# Patient Record
Sex: Female | Born: 1970 | Race: White | Hispanic: No | Smoking: Never smoker
Health system: Southern US, Community
[De-identification: ages and names within clinical notes are randomized; demographics above are authoritative.]

## PROBLEM LIST (undated history)

## (undated) DIAGNOSIS — B009 Herpesviral infection, unspecified: Secondary | ICD-10-CM

## (undated) DIAGNOSIS — R51 Headache: Secondary | ICD-10-CM

## (undated) DIAGNOSIS — F32A Depression, unspecified: Secondary | ICD-10-CM

## (undated) DIAGNOSIS — F419 Anxiety disorder, unspecified: Secondary | ICD-10-CM

## (undated) DIAGNOSIS — N200 Calculus of kidney: Secondary | ICD-10-CM

## (undated) DIAGNOSIS — G709 Myoneural disorder, unspecified: Secondary | ICD-10-CM

## (undated) DIAGNOSIS — K219 Gastro-esophageal reflux disease without esophagitis: Secondary | ICD-10-CM

## (undated) DIAGNOSIS — R519 Headache, unspecified: Secondary | ICD-10-CM

## (undated) DIAGNOSIS — G4730 Sleep apnea, unspecified: Secondary | ICD-10-CM

## (undated) DIAGNOSIS — I10 Essential (primary) hypertension: Secondary | ICD-10-CM

## (undated) DIAGNOSIS — G2581 Restless legs syndrome: Secondary | ICD-10-CM

## (undated) DIAGNOSIS — F329 Major depressive disorder, single episode, unspecified: Secondary | ICD-10-CM

## (undated) DIAGNOSIS — A64 Unspecified sexually transmitted disease: Secondary | ICD-10-CM

## (undated) DIAGNOSIS — T7840XA Allergy, unspecified, initial encounter: Secondary | ICD-10-CM

## (undated) DIAGNOSIS — J358 Other chronic diseases of tonsils and adenoids: Secondary | ICD-10-CM

## (undated) DIAGNOSIS — J189 Pneumonia, unspecified organism: Secondary | ICD-10-CM

## (undated) DIAGNOSIS — IMO0001 Reserved for inherently not codable concepts without codable children: Secondary | ICD-10-CM

## (undated) HISTORY — DX: Unspecified sexually transmitted disease: A64

## (undated) HISTORY — DX: Depression, unspecified: F32.A

## (undated) HISTORY — DX: Allergy, unspecified, initial encounter: T78.40XA

## (undated) HISTORY — PX: COLONOSCOPY: SHX174

## (undated) HISTORY — DX: Herpesviral infection, unspecified: B00.9

## (undated) HISTORY — DX: Anxiety disorder, unspecified: F41.9

## (undated) HISTORY — PX: ESSURE TUBAL LIGATION: SUR464

## (undated) HISTORY — DX: Gastro-esophageal reflux disease without esophagitis: K21.9

## (undated) HISTORY — PX: ECTOPIC PREGNANCY SURGERY: SHX613

## (undated) HISTORY — PX: UPPER GI ENDOSCOPY: SHX6162

## (undated) HISTORY — PX: WISDOM TOOTH EXTRACTION: SHX21

## (undated) HISTORY — DX: Major depressive disorder, single episode, unspecified: F32.9

---

## 1991-06-18 HISTORY — PX: DILATION AND CURETTAGE OF UTERUS: SHX78

## 1997-09-27 ENCOUNTER — Other Ambulatory Visit: Admission: RE | Admit: 1997-09-27 | Discharge: 1997-09-27 | Payer: Self-pay | Admitting: Obstetrics and Gynecology

## 1997-12-28 ENCOUNTER — Other Ambulatory Visit: Admission: RE | Admit: 1997-12-28 | Discharge: 1997-12-28 | Payer: Self-pay | Admitting: Obstetrics and Gynecology

## 1998-04-25 ENCOUNTER — Other Ambulatory Visit: Admission: RE | Admit: 1998-04-25 | Discharge: 1998-04-25 | Payer: Self-pay | Admitting: Obstetrics and Gynecology

## 1999-11-05 ENCOUNTER — Other Ambulatory Visit: Admission: RE | Admit: 1999-11-05 | Discharge: 1999-11-05 | Payer: Self-pay | Admitting: Obstetrics and Gynecology

## 2000-05-25 ENCOUNTER — Inpatient Hospital Stay (HOSPITAL_COMMUNITY): Admission: AD | Admit: 2000-05-25 | Discharge: 2000-05-27 | Payer: Self-pay | Admitting: Obstetrics and Gynecology

## 2000-05-28 ENCOUNTER — Encounter: Admission: RE | Admit: 2000-05-28 | Discharge: 2000-07-03 | Payer: Self-pay | Admitting: Obstetrics and Gynecology

## 2000-07-09 ENCOUNTER — Emergency Department (HOSPITAL_COMMUNITY): Admission: EM | Admit: 2000-07-09 | Discharge: 2000-07-10 | Payer: Self-pay | Admitting: Emergency Medicine

## 2001-02-08 ENCOUNTER — Encounter: Payer: Self-pay | Admitting: Emergency Medicine

## 2001-02-08 ENCOUNTER — Emergency Department (HOSPITAL_COMMUNITY): Admission: EM | Admit: 2001-02-08 | Discharge: 2001-02-08 | Payer: Self-pay | Admitting: Emergency Medicine

## 2001-02-25 ENCOUNTER — Ambulatory Visit (HOSPITAL_COMMUNITY): Admission: RE | Admit: 2001-02-25 | Discharge: 2001-02-26 | Payer: Self-pay | Admitting: General Surgery

## 2001-02-25 ENCOUNTER — Encounter (INDEPENDENT_AMBULATORY_CARE_PROVIDER_SITE_OTHER): Payer: Self-pay | Admitting: *Deleted

## 2001-02-25 ENCOUNTER — Encounter: Payer: Self-pay | Admitting: General Surgery

## 2001-02-25 HISTORY — PX: LAPAROSCOPIC CHOLECYSTECTOMY W/ CHOLANGIOGRAPHY: SUR757

## 2001-08-11 ENCOUNTER — Other Ambulatory Visit: Admission: RE | Admit: 2001-08-11 | Discharge: 2001-08-11 | Payer: Self-pay | Admitting: Obstetrics and Gynecology

## 2002-12-16 ENCOUNTER — Other Ambulatory Visit: Admission: RE | Admit: 2002-12-16 | Discharge: 2002-12-16 | Payer: Self-pay | Admitting: Obstetrics and Gynecology

## 2004-02-07 ENCOUNTER — Other Ambulatory Visit: Admission: RE | Admit: 2004-02-07 | Discharge: 2004-02-07 | Payer: Self-pay | Admitting: Obstetrics and Gynecology

## 2005-04-08 ENCOUNTER — Other Ambulatory Visit: Admission: RE | Admit: 2005-04-08 | Discharge: 2005-04-08 | Payer: Self-pay | Admitting: Obstetrics and Gynecology

## 2007-01-10 ENCOUNTER — Emergency Department (HOSPITAL_COMMUNITY): Admission: EM | Admit: 2007-01-10 | Discharge: 2007-01-10 | Payer: Self-pay | Admitting: Emergency Medicine

## 2007-08-20 ENCOUNTER — Ambulatory Visit (HOSPITAL_COMMUNITY): Admission: RE | Admit: 2007-08-20 | Discharge: 2007-08-20 | Payer: Self-pay | Admitting: Urology

## 2009-04-24 ENCOUNTER — Encounter: Admission: RE | Admit: 2009-04-24 | Discharge: 2009-04-24 | Payer: Self-pay | Admitting: Neurology

## 2009-07-28 ENCOUNTER — Ambulatory Visit (HOSPITAL_COMMUNITY): Admission: RE | Admit: 2009-07-28 | Discharge: 2009-07-28 | Payer: Self-pay | Admitting: Neurology

## 2009-07-31 ENCOUNTER — Encounter: Admission: RE | Admit: 2009-07-31 | Discharge: 2009-07-31 | Payer: Self-pay | Admitting: Neurology

## 2009-09-17 ENCOUNTER — Emergency Department (HOSPITAL_COMMUNITY): Admission: EM | Admit: 2009-09-17 | Discharge: 2009-09-17 | Payer: Self-pay | Admitting: Family Medicine

## 2010-07-16 ENCOUNTER — Ambulatory Visit: Payer: Self-pay | Admitting: Oncology

## 2010-08-06 ENCOUNTER — Other Ambulatory Visit: Payer: Self-pay | Admitting: Oncology

## 2010-08-06 ENCOUNTER — Encounter (HOSPITAL_BASED_OUTPATIENT_CLINIC_OR_DEPARTMENT_OTHER): Payer: Self-pay | Admitting: Oncology

## 2010-08-06 DIAGNOSIS — D72829 Elevated white blood cell count, unspecified: Secondary | ICD-10-CM

## 2010-08-06 LAB — COMPREHENSIVE METABOLIC PANEL
AST: 17 U/L (ref 0–37)
Alkaline Phosphatase: 88 U/L (ref 39–117)
BUN: 15 mg/dL (ref 6–23)
Creatinine, Ser: 0.76 mg/dL (ref 0.40–1.20)

## 2010-08-06 LAB — MORPHOLOGY
PLT EST: ADEQUATE
RBC Comments: NORMAL

## 2010-08-06 LAB — CBC WITH DIFFERENTIAL/PLATELET
BASO%: 0.3 % (ref 0.0–2.0)
EOS%: 0.5 % (ref 0.0–7.0)
HGB: 14.8 g/dL (ref 11.6–15.9)
MCH: 31.3 pg (ref 25.1–34.0)
MCHC: 34.3 g/dL (ref 31.5–36.0)
RDW: 12.3 % (ref 11.2–14.5)
lymph#: 2.8 10*3/uL (ref 0.9–3.3)

## 2010-08-06 LAB — IRON AND TIBC
%SAT: 36 % (ref 20–55)
Iron: 108 ug/dL (ref 42–145)
UIBC: 188 ug/dL

## 2010-08-08 LAB — LEUKOCYTE ALKALINE PHOSPHATASE: Leukocyte Alkaline Phos Stain: 194 — ABNORMAL HIGH (ref 30–140)

## 2010-08-10 ENCOUNTER — Encounter: Payer: Self-pay | Admitting: Oncology

## 2010-08-17 ENCOUNTER — Other Ambulatory Visit: Payer: Self-pay | Admitting: Oncology

## 2010-08-17 ENCOUNTER — Encounter (HOSPITAL_BASED_OUTPATIENT_CLINIC_OR_DEPARTMENT_OTHER): Payer: 59 | Admitting: Oncology

## 2010-08-17 DIAGNOSIS — D72829 Elevated white blood cell count, unspecified: Secondary | ICD-10-CM

## 2010-08-17 LAB — CBC WITH DIFFERENTIAL/PLATELET
Basophils Absolute: 0 10*3/uL (ref 0.0–0.1)
EOS%: 0.7 % (ref 0.0–7.0)
HGB: 14.3 g/dL (ref 11.6–15.9)
LYMPH%: 22 % (ref 14.0–49.7)
MCH: 31.5 pg (ref 25.1–34.0)
MCV: 92.5 fL (ref 79.5–101.0)
MONO%: 5.3 % (ref 0.0–14.0)
Platelets: 329 10*3/uL (ref 145–400)
RBC: 4.54 10*6/uL (ref 3.70–5.45)
RDW: 12.9 % (ref 11.2–14.5)

## 2010-08-20 ENCOUNTER — Ambulatory Visit (HOSPITAL_COMMUNITY)
Admission: RE | Admit: 2010-08-20 | Discharge: 2010-08-20 | Disposition: A | Discharge status: Home / Self Care | Payer: 59 | Source: Ambulatory Visit | Attending: Oncology | Admitting: Oncology

## 2010-08-20 ENCOUNTER — Encounter (HOSPITAL_COMMUNITY): Payer: 59

## 2010-08-20 DIAGNOSIS — M7989 Other specified soft tissue disorders: Secondary | ICD-10-CM | POA: Insufficient documentation

## 2010-08-20 DIAGNOSIS — M79609 Pain in unspecified limb: Secondary | ICD-10-CM | POA: Insufficient documentation

## 2010-08-20 DIAGNOSIS — I10 Essential (primary) hypertension: Secondary | ICD-10-CM | POA: Insufficient documentation

## 2010-09-05 LAB — CSF CELL COUNT WITH DIFFERENTIAL
Eosinophils, CSF: 0 % (ref 0–1)
RBC Count, CSF: 0 /mm3
WBC, CSF: 0 /mm3 (ref 0–5)
WBC, CSF: 1 /mm3 (ref 0–5)

## 2010-09-05 LAB — CSF CULTURE W GRAM STAIN: Culture: NO GROWTH

## 2010-09-05 LAB — PROTEIN AND GLUCOSE, CSF: Total  Protein, CSF: 29 mg/dL (ref 15–45)

## 2010-11-02 NOTE — Op Note (Signed)
[facility]  Patient:    Helen Greene, Helen Greene Visit Number: [id number] MRN: [id number]          Service Type: DSU Location: [id number] Attending Physician:  Cherylynn Ridges Dictated by:   Jimmye Norman, M.D. Proc. Date: 02/25/01 Admit Date:  02/25/2001                             Operative Report  PREOPERATIVE DIAGNOSIS:  Symptomatic cholelithiasis.  POSTOPERATIVE DIAGNOSIS:  Symptomatic cholelithiasis.  OPERATION PERFORMED:  Laparoscopic cholecystectomy.  SURGEON:  Jimmye Norman, M.D.  ASSISTANT:  Zigmund Daniel, M.D.  ANESTHESIA:  General endotracheal.  ESTIMATED BLOOD LOSS:  Less than 30 cc.  COMPLICATIONS:  None.  CONDITION:  Stable.  FINDINGS:  Multiple adhesions to the inferior posterior aspect of the liver attaching the duodenum and omentum to the liver edge.  Not acutely inflamed gallbladder with chronic cholecystitis .  DISPOSITION:  To PACU and then 5700 when stable.  INDICATIONS FOR PROCEDURE:  The patient is a 40 year old woman who has been plagued with abdominal pain in the right upper quadrant postprandially.  She now comes in laparoscopic cholecystectomy.  DESCRIPTION OF PROCEDURE:  The patient was taken to the operating room and placed on the table in a supine position.  After an adequate general anesthetic was administered, she was prepped and draped in the usual sterile manner exposing the midline in the right upper quadrant of the abdomen.  The patient was placed in reverse Trendelenburg position.  A superumbilical curvilinear incision was made using a #11 blade and taken down through the midline fascia.  It was through this midline fascia that a Veress needle was passed into the peritoneal cavity while tenting up against the anterior abdominal wall with sharp towel clamps.  We confirmed the position of the Veress needle using the saline test and once this was done, carbon dioxide insufflation was instilled into the  peritoneal cavity up to a maximal intra-abdominal pressure of 15 mmHg.  Once that was done a 10-11 mm trocar and cannula was passed through the superumbilical fascia into the peritoneal cavity and confirmed to be in adequate position using the laparoscope with attached camera and light source.  Subsequently, two right costal margin 5 mm cannulas in the subxiphoid 10-11 mm cannula were passed into the peritoneal cavity under direct vision. The patient was placed in steeper reverse Trendelenburg position.  The left side was tilted down and the dissection begun.  The adhesions were noted when the gallbladder was lifted up towards the anterior abdominal wall in the right upper quadrant.  These adhesions were to the posterior inferior aspect of the liver.  They were taken down using Endo shears and electrocautery.  Care was taken not to injure the bowel during this dissection.  Once these adhesions were taken down, we began dissection along the peritoneum overlying the triangle of Calot and the hepatoduodenal triangle.  These peritoneal attachments were taken down and subsequently the cystic duct and cystic artery identified and clipped proximally and distally. Distally, away from the gallbladder, the both the artery and the duct were doubly clipped and then subsequently transected.  Once this was done, we dissected the gallbladder out of its bed with minimal difficulty and no entrance into the gallbladder wall.  We were able to remove the superumbilical fascial site with minimal difficulty because of pliability of the gallbladder. We obtained hemostasis of the gallbladder bed  using electrocautery which was minimal in terms of bleeding.  We irrigated with saline.  Less than a liter was used and then subsequently allowed all gas to escape from the cannula sites.  Once this was done, we were able to reapproximate the superumbilical fascia using a figure-of-eight stitch of 0 Vicryl.  0.25%  Marcaine with epinephrine was injected at all incision sites into the subcutaneous.  Once this was done, we reapproximated the skin using a running subcuticular stitch of 4-0 Vicryl.  Sponge, needle and instrument counts were correct.  Dry sterile dressings were applied including Steri-Strips, 4 x 4 gauze and Betadine ointment. Dictated by:   Jimmye Norman, M.D. Attending Physician:  Cherylynn Ridges DD:  02/25/01 TD:  02/25/01 Job: [id number]

## 2011-02-25 ENCOUNTER — Encounter (HOSPITAL_BASED_OUTPATIENT_CLINIC_OR_DEPARTMENT_OTHER): Payer: 59 | Admitting: Oncology

## 2011-02-25 ENCOUNTER — Other Ambulatory Visit: Payer: Self-pay | Admitting: Oncology

## 2011-02-25 DIAGNOSIS — D72829 Elevated white blood cell count, unspecified: Secondary | ICD-10-CM

## 2011-02-25 LAB — CBC WITH DIFFERENTIAL/PLATELET
BASO%: 0.2 % (ref 0.0–2.0)
Basophils Absolute: 0 10e3/uL (ref 0.0–0.1)
EOS%: 0.6 % (ref 0.0–7.0)
Eosinophils Absolute: 0.1 10e3/uL (ref 0.0–0.5)
HCT: 41.9 % (ref 34.8–46.6)
HGB: 14.4 g/dL (ref 11.6–15.9)
LYMPH%: 26.1 % (ref 14.0–49.7)
MCH: 32.1 pg (ref 25.1–34.0)
MCHC: 34.3 g/dL (ref 31.5–36.0)
MCV: 93.6 fL (ref 79.5–101.0)
MONO#: 0.7 10e3/uL (ref 0.1–0.9)
MONO%: 5.7 % (ref 0.0–14.0)
NEUT#: 8.7 10e3/uL — ABNORMAL HIGH (ref 1.5–6.5)
NEUT%: 67.4 % (ref 38.4–76.8)
Platelets: 346 10e3/uL (ref 145–400)
RBC: 4.48 10e6/uL (ref 3.70–5.45)
RDW: 12.4 % (ref 11.2–14.5)
WBC: 12.9 10e3/uL — ABNORMAL HIGH (ref 3.9–10.3)
lymph#: 3.4 10e3/uL — ABNORMAL HIGH (ref 0.9–3.3)

## 2011-03-11 LAB — BASIC METABOLIC PANEL
CO2: 24
Calcium: 9.6
Chloride: 105
Creatinine, Ser: 0.79
GFR calc Af Amer: >60
Sodium: 138

## 2011-03-11 LAB — PREGNANCY, URINE: Preg Test, Ur: NEGATIVE

## 2011-04-03 ENCOUNTER — Emergency Department (HOSPITAL_COMMUNITY): Payer: 59

## 2011-04-03 ENCOUNTER — Emergency Department (HOSPITAL_COMMUNITY)
Admission: EM | Admit: 2011-04-03 | Discharge: 2011-04-03 | Disposition: A | Discharge status: Home / Self Care | Payer: 59 | Attending: Emergency Medicine | Admitting: Emergency Medicine

## 2011-04-03 ENCOUNTER — Inpatient Hospital Stay (INDEPENDENT_AMBULATORY_CARE_PROVIDER_SITE_OTHER)
Admission: RE | Admit: 2011-04-03 | Discharge: 2011-04-03 | Disposition: A | Discharge status: Home / Self Care | Payer: 59 | Source: Ambulatory Visit | Attending: Emergency Medicine | Admitting: Emergency Medicine

## 2011-04-03 DIAGNOSIS — R109 Unspecified abdominal pain: Secondary | ICD-10-CM | POA: Insufficient documentation

## 2011-04-03 DIAGNOSIS — I10 Essential (primary) hypertension: Secondary | ICD-10-CM | POA: Insufficient documentation

## 2011-04-03 DIAGNOSIS — R197 Diarrhea, unspecified: Secondary | ICD-10-CM | POA: Insufficient documentation

## 2011-04-03 DIAGNOSIS — J984 Other disorders of lung: Secondary | ICD-10-CM | POA: Insufficient documentation

## 2011-04-03 DIAGNOSIS — N12 Tubulo-interstitial nephritis, not specified as acute or chronic: Secondary | ICD-10-CM | POA: Insufficient documentation

## 2011-04-03 DIAGNOSIS — R112 Nausea with vomiting, unspecified: Secondary | ICD-10-CM | POA: Insufficient documentation

## 2011-04-03 LAB — LIPASE, BLOOD: Lipase: 35 U/L (ref 11–59)

## 2011-04-03 LAB — PREGNANCY, URINE: Preg Test, Ur: NEGATIVE

## 2011-04-03 LAB — DIFFERENTIAL
Basophils Absolute: 0 10*3/uL (ref 0.0–0.1)
Basophils Relative: 0 % (ref 0–1)
Eosinophils Absolute: 0 10*3/uL (ref 0.0–0.7)
Eosinophils Relative: 0 % (ref 0–5)
Lymphocytes Relative: 7 % — ABNORMAL LOW (ref 12–46)
Lymphs Abs: 1.5 10*3/uL (ref 0.7–4.0)
Monocytes Absolute: 1 10*3/uL (ref 0.1–1.0)
Monocytes Relative: 4 % (ref 3–12)
Neutro Abs: 20.5 10*3/uL — ABNORMAL HIGH (ref 1.7–7.7)
Neutrophils Relative %: 89 % — ABNORMAL HIGH (ref 43–77)

## 2011-04-03 LAB — COMPREHENSIVE METABOLIC PANEL WITH GFR
ALT: 18 U/L (ref 0–35)
AST: 17 U/L (ref 0–37)
Albumin: 4.2 g/dL (ref 3.5–5.2)
Alkaline Phosphatase: 100 U/L (ref 39–117)
BUN: 28 mg/dL — ABNORMAL HIGH (ref 6–23)
CO2: 24 meq/L (ref 19–32)
Calcium: 11.4 mg/dL — ABNORMAL HIGH (ref 8.4–10.5)
Chloride: 101 meq/L (ref 96–112)
Creatinine, Ser: 1.65 mg/dL — ABNORMAL HIGH (ref 0.50–1.10)
GFR calc Af Amer: 44 mL/min — ABNORMAL LOW
GFR calc non Af Amer: 38 mL/min — ABNORMAL LOW
Glucose, Bld: 113 mg/dL — ABNORMAL HIGH (ref 70–99)
Potassium: 3.9 meq/L (ref 3.5–5.1)
Sodium: 137 meq/L (ref 135–145)
Total Bilirubin: 0.5 mg/dL (ref 0.3–1.2)
Total Protein: 7.8 g/dL (ref 6.0–8.3)

## 2011-04-03 LAB — URINALYSIS, ROUTINE W REFLEX MICROSCOPIC
Bilirubin Urine: NEGATIVE
Glucose, UA: NEGATIVE mg/dL
Hgb urine dipstick: NEGATIVE
Ketones, ur: NEGATIVE mg/dL
Leukocytes, UA: NEGATIVE
Nitrite: NEGATIVE
Protein, ur: NEGATIVE mg/dL
Specific Gravity, Urine: 1.009 (ref 1.005–1.030)
Urobilinogen, UA: 0.2 mg/dL (ref 0.0–1.0)
pH: 7 (ref 5.0–8.0)

## 2011-04-03 LAB — CBC
HCT: 43.6 % (ref 36.0–46.0)
Hemoglobin: 15.2 g/dL — ABNORMAL HIGH (ref 12.0–15.0)
MCHC: 34.9 g/dL (ref 30.0–36.0)
RBC: 4.8 MIL/uL (ref 3.87–5.11)

## 2011-04-03 MED ORDER — IOHEXOL 300 MG/ML  SOLN
65.0000 mL | Freq: Once | INTRAMUSCULAR | Status: AC | PRN
  Administered 2011-04-03: 65 mL via INTRAVENOUS

## 2011-09-25 ENCOUNTER — Encounter: Payer: Self-pay | Admitting: Oncology

## 2011-09-25 ENCOUNTER — Other Ambulatory Visit (HOSPITAL_BASED_OUTPATIENT_CLINIC_OR_DEPARTMENT_OTHER): Payer: 59 | Admitting: Lab

## 2011-09-25 ENCOUNTER — Ambulatory Visit (HOSPITAL_BASED_OUTPATIENT_CLINIC_OR_DEPARTMENT_OTHER): Payer: 59 | Admitting: Oncology

## 2011-09-25 DIAGNOSIS — D72829 Elevated white blood cell count, unspecified: Secondary | ICD-10-CM

## 2011-09-25 DIAGNOSIS — R911 Solitary pulmonary nodule: Secondary | ICD-10-CM

## 2011-09-25 LAB — CBC WITH DIFFERENTIAL/PLATELET
BASO%: 0.6 % (ref 0.0–2.0)
HCT: 42.5 % (ref 34.8–46.6)
LYMPH%: 25 % (ref 14.0–49.7)
MCHC: 34.2 g/dL (ref 31.5–36.0)
MCV: 92.4 fL (ref 79.5–101.0)
MONO#: 0.8 10*3/uL (ref 0.1–0.9)
MONO%: 6.7 % (ref 0.0–14.0)
NEUT%: 65 % (ref 38.4–76.8)
Platelets: 314 10*3/uL (ref 145–400)
WBC: 11.7 10*3/uL — ABNORMAL HIGH (ref 3.9–10.3)

## 2011-09-25 NOTE — Patient Instructions (Signed)
You are doing great!!  I will now see you only once a year

## 2011-09-25 NOTE — Progress Notes (Signed)
OFFICE PROGRESS NOTE  CC  Allean Found, MD, MD [address] [place] [address]  DIAGNOSIS: Reactive Leukocytosis  PRIOR THERAPY:Observation  CURRENT THERAPY:Observation  INTERVAL HISTORY: Helen Greene 41 y.o. female returns for Followup visit today. Clinically she seems to be doing well however she is suffering from an upper respiratory tract infection she has what sounds like sinus infections and or allergies. She denies any other 1 is fevers or chills or night sweats no headaches double vision she seems to be less stressed. Patient did have a back in October 2012 some abdominal pain she presented to the emergency room and had a CT of the abdomen performed that showed some 2 subpleural nodules measuring about 4 mm she is quite concerned about this and she wants to know what to do with it. I have reviewed the scan and that does say to have her have a scan repeated in about a years time. I have recommended that she have this done at her primary care doctor's office and she will speak with Dr. Katrinka Blazing. Patient has no hematuria hematochezia no melanoma. Her white count she looks reasonably well in spite of the fact that she does have an upper respiratory tract infection today. Remainder of the 10 point review of systems is negative.  MEDICAL HISTORY: Past Medical History  Diagnosis Date  . Elevated white blood cell count   . Anxiety   . GERD (gastroesophageal reflux disease)   . Elevated white blood cell count 09/25/2011    ALLERGIES:  is allergic to enablex; erythromycin; and nitrofuran derivatives.  MEDICATIONS:  Current Outpatient Prescriptions  Medication Sig Dispense Refill  . buPROPion (WELLBUTRIN SR) 150 MG 12 hr tablet Take 150 mg by mouth daily.      . clonazePAM (KLONOPIN) 1 MG tablet Take 1 mg by mouth at bedtime as needed.      . etodolac (LODINE) 500 MG tablet Take 500 mg by mouth as needed.      Marland Kitchen FLUoxetine (PROZAC) 40 MG capsule Take 40 mg by mouth  daily.      Marland Kitchen lisinopril-hydrochlorothiazide (PRINZIDE,ZESTORETIC) 20-25 MG per tablet Take 1 tablet by mouth daily.      . nabumetone (RELAFEN) 750 MG tablet Take 750 mg by mouth as needed.      . Omeprazole (PRILOSEC PO) Take 20 mg by mouth daily.        SURGICAL HISTORY:  Past Surgical History  Procedure Date  . Cholecystectomy   . Ectopic pregnancy surgery     REVIEW OF SYSTEMS:  Pertinent items are noted in HPI.   PHYSICAL EXAMINATION: General appearance: alert, cooperative and appears stated age Neck: no adenopathy, no carotid bruit, no JVD, supple, symmetrical, trachea midline and thyroid not enlarged, symmetric, no tenderness/mass/nodules Lymph nodes: Cervical, supraclavicular, and axillary nodes normal. Resp: clear to auscultation bilaterally and normal percussion bilaterally Back: symmetric, no curvature. ROM normal. No CVA tenderness. Cardio: regular rate and rhythm, S1, S2 normal, no murmur, click, rub or gallop GI: soft, non-tender; bowel sounds normal; no masses,  no organomegaly Extremities: extremities normal, atraumatic, no cyanosis or edema Neurologic: Grossly normal  ECOG PERFORMANCE STATUS: 0 - Asymptomatic  Blood pressure 101/69, pulse 98, temperature 98.4 F (36.9 C), height 5' 0.5" (1.537 m), weight 175 lb 9.6 oz (79.652 kg).  LABORATORY DATA: Lab Results  Component Value Date   WBC 11.7* 09/25/2011   HGB 14.5 09/25/2011   HCT 42.5 09/25/2011   MCV 92.4 09/25/2011   PLT 314  09/25/2011      Chemistry      Component Value Date/Time   NA 137 04/03/2011 1008   K 3.9 04/03/2011 1008   CL 101 04/03/2011 1008   CO2 24 04/03/2011 1008   BUN 28* 04/03/2011 1008   CREATININE 1.65* 04/03/2011 1008      Component Value Date/Time   CALCIUM 11.4* 04/03/2011 1008   ALKPHOS 100 04/03/2011 1008   AST 17 04/03/2011 1008   ALT 18 04/03/2011 1008   BILITOT 0.5 04/03/2011 1008       RADIOGRAPHIC STUDIES:  No results found.  ASSESSMENT: 41 year old female  with  #1 reactive leukocytosis.  #2 incidental finding of a pulmonary nodule.   PLAN:   #1 patient's leukocytosis is very stable. My recommendation at this time is to see me only on a yearly basis. Of course I can see her sooner if there is a change in her counts.  #2 I have recommended the patient followup with her primary care physician regarding the pulmonary nodule and if she feels that the nodule needs to be re imaged again certainly a re imaging in a month or so may be appropriate although her in a years which would be in October of this year.  #3 patient was reassured.   All questions were answered. The patient knows to call the clinic with any problems, questions or concerns. We can certainly see the patient much sooner if necessary.  I spent 25 minutes counseling the patient face to face. The total time spent in the appointment was 30 minutes.    Drue Second, MD Medical/Oncology [facility] [phone] (beeper) [phone] (Office)  09/25/2011, 3:00 PM

## 2012-04-09 ENCOUNTER — Other Ambulatory Visit: Payer: Self-pay | Admitting: Family Medicine

## 2012-04-09 DIAGNOSIS — R911 Solitary pulmonary nodule: Secondary | ICD-10-CM

## 2012-04-21 ENCOUNTER — Ambulatory Visit
Admission: RE | Admit: 2012-04-21 | Discharge: 2012-04-21 | Disposition: A | Discharge status: Home / Self Care | Payer: 59 | Source: Ambulatory Visit | Attending: Family Medicine | Admitting: Family Medicine

## 2012-04-21 DIAGNOSIS — R911 Solitary pulmonary nodule: Secondary | ICD-10-CM

## 2012-04-21 MED ORDER — IOHEXOL 300 MG/ML  SOLN
75.0000 mL | Freq: Once | INTRAMUSCULAR | Status: AC | PRN
  Administered 2012-04-21: 75 mL via INTRAVENOUS

## 2013-05-19 ENCOUNTER — Ambulatory Visit: Payer: 59

## 2013-05-19 ENCOUNTER — Ambulatory Visit (INDEPENDENT_AMBULATORY_CARE_PROVIDER_SITE_OTHER): Payer: 59 | Admitting: Family Medicine

## 2013-05-19 VITALS — BP 118/76 | HR 108 | Temp 98.6°F | Resp 17 | Ht 61.0 in | Wt 152.0 lb

## 2013-05-19 DIAGNOSIS — M79602 Pain in left arm: Secondary | ICD-10-CM

## 2013-05-19 DIAGNOSIS — M799 Soft tissue disorder, unspecified: Secondary | ICD-10-CM

## 2013-05-19 DIAGNOSIS — I10 Essential (primary) hypertension: Secondary | ICD-10-CM

## 2013-05-19 LAB — POCT CBC
MCH, POC: 30.7 pg (ref 27–31.2)
MCHC: 30.2 g/dL — AB (ref 31.8–35.4)
MID (cbc): 0.6 (ref 0–0.9)
MPV: 8.9 fL (ref 0–99.8)
POC MID %: 4.8 %M (ref 0–12)
Platelet Count, POC: 248 10*3/uL (ref 142–424)
RBC: 3.68 M/uL — AB (ref 4.04–5.48)
WBC: 12.6 10*3/uL — AB (ref 4.6–10.2)

## 2013-05-19 NOTE — Patient Instructions (Signed)
Thank you for coming in today  For high blood pressure: - your BP is normal today - we will stop your BP med - check your BP if you are in the drug store - f/u with primary doctor in 1 month for recheck - we are rechecking your kidneys today. We will call you with results and any needed followup  For arm pain - try alternating hot and cold for 15 min each 2x per day - try compression wrap - tricep exercise 3x15 daily - keep a symptom diary - followup if needed

## 2013-05-19 NOTE — Progress Notes (Signed)
   Subjective:    Patient ID: Helen Greene, female    DOB: 04/18/1971, 42 y.o.   MRN: [id number]  HPI Patient presents for 2 concerns. Needs refill of lisinopril-HCTZ for HTN. Has been on lisinopril-HCTZ for several years and usually goes to [facility]. Has not had renal labs checked in 2 years.  Also concerned about left arm pain and lesion. Feels lump in back of arm. Occasionally becomes painful and will radiate down or up arm. Sometimes does go up neck. Occasionally goes into fingers. Occasionally gets some tingling in fingers. Also, arm sometimes feels weak. Has been going on for 2 years, and seems to be getting worse instead of better. Gets increased pain with "messing with it" and also without trigger. ? Night pain? Intentional weight loss, but does have night sweats. On review of EMR has lost > 25 lbs in last 1-2 years. Has tried tylenol or advil with occasional relief.  Review of Systems  All other systems reviewed and are negative.      Objective:   Physical Exam  Constitutional: She is oriented to person, place, and time. She appears well-developed and well-nourished. No distress.  HENT:  Head: Normocephalic and atraumatic.  Eyes: Conjunctivae are normal. Pupils are equal, round, and reactive to light. No scleral icterus.  Neck: Normal range of motion. Neck supple.  Cardiovascular: Normal rate, regular rhythm and normal heart sounds.   Pulmonary/Chest: Effort normal and breath sounds normal. No respiratory distress. She has no wheezes.  Musculoskeletal: Normal range of motion.       Left upper arm: She exhibits tenderness. She exhibits no swelling and no deformity.  Neurological: She is alert and oriented to person, place, and time.  Skin: Skin is warm and dry. She is not diaphoretic.  Psychiatric: She has a normal mood and affect. Her behavior is normal.   Radiographs: [facility] reading (PRIMARY) by  Dr. Neomia Dear. No sign of fracture or tumor    Assessment & Plan:  #1. HTN - BP  well controlled today off anti-HTN for 1 week - Last Cr was elevated to 1.65 when checked 2 years ago. Will recheck - F/u with PCP in 1 month for recheck  #2. Left upper arm pain - No sign of neck pathology on exam - Focal tenderness over posterior arm in area of triceps and humerus - Given chronicity, weight loss, night sweats, and h/o leukocytosis will check humerus xray and CBC - XR humerus: negative

## 2013-05-20 LAB — BASIC METABOLIC PANEL
BUN: 18 mg/dL (ref 6–23)
Potassium: 3.9 mEq/L (ref 3.5–5.3)

## 2013-12-15 ENCOUNTER — Emergency Department (HOSPITAL_COMMUNITY)
Admission: EM | Admit: 2013-12-15 | Discharge: 2013-12-15 | Disposition: A | Discharge status: Home / Self Care | Payer: 59 | Source: Home / Self Care | Attending: Family Medicine | Admitting: Family Medicine

## 2013-12-15 ENCOUNTER — Encounter (HOSPITAL_COMMUNITY): Payer: Self-pay | Admitting: Emergency Medicine

## 2013-12-15 ENCOUNTER — Emergency Department (INDEPENDENT_AMBULATORY_CARE_PROVIDER_SITE_OTHER): Payer: 59

## 2013-12-15 DIAGNOSIS — IMO0002 Reserved for concepts with insufficient information to code with codable children: Secondary | ICD-10-CM

## 2013-12-15 DIAGNOSIS — S76219A Strain of adductor muscle, fascia and tendon of unspecified thigh, initial encounter: Secondary | ICD-10-CM

## 2013-12-15 DIAGNOSIS — X58XXXA Exposure to other specified factors, initial encounter: Secondary | ICD-10-CM

## 2013-12-15 MED ORDER — MELOXICAM 7.5 MG PO TABS: 7.5000 mg | ORAL_TABLET | Freq: Two times a day (BID) | ORAL | Status: DC

## 2013-12-15 MED ORDER — METHOCARBAMOL 500 MG PO TABS: 500.0000 mg | ORAL_TABLET | Freq: Two times a day (BID) | ORAL | Status: DC

## 2013-12-15 NOTE — ED Notes (Signed)
C/o R upper thigh pain off an on for 1 year.  Has been constant since Sunday.  Feels like she pulled something.  No known injury.

## 2013-12-15 NOTE — Discharge Instructions (Signed)
Heat to leg muscle , use medicine as needed, see orthopedist for further care.

## 2013-12-15 NOTE — ED Provider Notes (Signed)
CSN: [id number]     Arrival date & time 12/15/13  1722 History   First MD Initiated Contact with Patient 12/15/13 1831     Chief Complaint  Patient presents with  . Leg Pain   (Consider location/radiation/quality/duration/timing/severity/associated sxs/prior Treatment) Patient is a 43 y.o. female presenting with hip pain. The history is provided by the patient.  Hip Pain This is a recurrent problem. The current episode started more than 1 week ago (sx off and on for sev mos, NKI, just thought it was time to get checked.). The problem has been gradually worsening. Pertinent negatives include no abdominal pain. The symptoms are aggravated by walking.    Past Medical History  Diagnosis Date  . Elevated white blood cell count   . Anxiety   . GERD (gastroesophageal reflux disease)   . Elevated white blood cell count 09/25/2011   Past Surgical History  Procedure Laterality Date  . Cholecystectomy    . Ectopic pregnancy surgery    . Wisdom tooth extraction    . Dilation and curettage of uterus  1993  . Essure tubal ligation     Family History  Problem Relation Age of Onset  . Hypertension Mother   . Heart failure Mother   . Heart disease Brother   . Cancer Father     Leukemia  . Hypertension Father    History  Substance Use Topics  . Smoking status: Never Smoker   . Smokeless tobacco: Never Used  . Alcohol Use: No   OB History   Grav Para Term Preterm Abortions TAB SAB Ect Mult Living                 Review of Systems  Constitutional: Negative.   Gastrointestinal: Negative.  Negative for abdominal pain.  Genitourinary: Negative.   Musculoskeletal: Positive for gait problem. Negative for back pain, joint swelling and myalgias.    Allergies  Enablex; Erythromycin; and Nitrofuran derivatives  Home Medications   Prior to Admission medications   Medication Sig Start Date End Date Taking? Authorizing Provider  amitriptyline (ELAVIL) 10 MG tablet Take 10 mg by mouth at  bedtime.   Yes Historical Provider, MD  buPROPion (WELLBUTRIN SR) 150 MG 12 hr tablet Take 150 mg by mouth daily.   Yes Historical Provider, MD  clonazePAM (KLONOPIN) 1 MG tablet Take 1 mg by mouth at bedtime as needed.   Yes Historical Provider, MD  escitalopram (LEXAPRO) 10 MG tablet Take 10 mg by mouth daily.   Yes Historical Provider, MD  etodolac (LODINE) 500 MG tablet Take 500 mg by mouth as needed.   Yes Historical Provider, MD  hydrOXYzine (ATARAX/VISTARIL) 25 MG tablet Take 25 mg by mouth 3 (three) times daily as needed for anxiety.   Yes Historical Provider, MD  ibuprofen (ADVIL,MOTRIN) 800 MG tablet Take 800 mg by mouth every 8 (eight) hours as needed.   Yes Historical Provider, MD  lamoTRIgine (LAMICTAL) 200 MG tablet Take 200 mg by mouth daily.   Yes Historical Provider, MD  Omeprazole (PRILOSEC PO) Take 20 mg by mouth 2 (two) times daily.    Yes Historical Provider, MD  oxyCODONE-acetaminophen (PERCOCET/ROXICET) 5-325 MG per tablet Take 1 tablet by mouth every 4 (four) hours as needed for severe pain.   Yes Historical Provider, MD  FLUoxetine (PROZAC) 40 MG capsule Take 40 mg by mouth daily.    Historical Provider, MD  meloxicam (MOBIC) 7.5 MG tablet Take 1 tablet (7.5 mg total) by mouth 2 (two) times  daily after a meal. 12/15/13   Linna HoffJames D Deshanna Kama, MD  methocarbamol (ROBAXIN) 500 MG tablet Take 1 tablet (500 mg total) by mouth 2 (two) times daily. As muscle relaxer 12/15/13   Linna HoffJames D Niya Behler, MD  methocarbamol (ROBAXIN) 750 MG tablet Take 750 mg by mouth 4 (four) times daily.    Historical Provider, MD  nabumetone (RELAFEN) 750 MG tablet Take 750 mg by mouth as needed.    Historical Provider, MD  naratriptan (AMERGE) 2.5 MG tablet Take 2.5 mg by mouth as needed for migraine. Take one (1) tablet at onset of headache; if returns or does not resolve, may repeat after 4 hours; do not exceed five (5) mg in 24 hours.    Historical Provider, MD  valACYclovir (VALTREX) 500 MG tablet Take 500 mg by  mouth 2 (two) times daily.    Historical Provider, MD   BP 114/80  Pulse 90  Temp(Src) 98.4 F (36.9 C) (Oral)  Resp 16  SpO2 98% Physical Exam  Nursing note and vitals reviewed. Constitutional: She is oriented to person, place, and time. She appears well-developed and well-nourished.  Abdominal: Soft. Bowel sounds are normal. She exhibits no mass. There is no tenderness.  Musculoskeletal: She exhibits tenderness.  Right medial thigh at groin tenderness to palp and with rom, no adenopathy, distal nvt intact.  Neurological: She is alert and oriented to person, place, and time.  Skin: Skin is warm and dry.    ED Course  Procedures (including critical care time) Labs Review Labs Reviewed - No data to display  Imaging Review Dg Hip Complete Right  12/15/2013   CLINICAL DATA:  Right hip pain, no known injury  EXAM: RIGHT HIP - COMPLETE 2+ VIEW  COMPARISON:  None.  FINDINGS: No fracture or dislocation is seen.  The joint spaces are preserved.  Visualized bony pelvis appears intact.  Left tubal occlusion device.  IMPRESSION: Normal right hip radiographs.   Electronically Signed   By: Charline BillsSriyesh  Krishnan M.D.   On: 12/15/2013 19:16   X-rays reviewed and report per radiologist.   MDM   1. Groin strain, initial encounter        Linna HoffJames D Norva Bowe, MD 12/15/13 [id number]

## 2013-12-27 ENCOUNTER — Encounter: Payer: Self-pay | Admitting: Neurology

## 2014-01-04 ENCOUNTER — Institutional Professional Consult (permissible substitution): Payer: 59 | Admitting: Neurology

## 2014-01-06 ENCOUNTER — Ambulatory Visit (INDEPENDENT_AMBULATORY_CARE_PROVIDER_SITE_OTHER): Payer: 59 | Admitting: Neurology

## 2014-01-06 ENCOUNTER — Encounter: Payer: Self-pay | Admitting: Neurology

## 2014-01-06 VITALS — BP 127/84 | HR 99 | Temp 99.1°F | Ht 61.0 in | Wt 166.0 lb

## 2014-01-06 DIAGNOSIS — R0683 Snoring: Secondary | ICD-10-CM

## 2014-01-06 DIAGNOSIS — R400 Somnolence: Secondary | ICD-10-CM

## 2014-01-06 DIAGNOSIS — R0989 Other specified symptoms and signs involving the circulatory and respiratory systems: Secondary | ICD-10-CM

## 2014-01-06 DIAGNOSIS — R51 Headache: Secondary | ICD-10-CM

## 2014-01-06 DIAGNOSIS — R519 Headache, unspecified: Secondary | ICD-10-CM

## 2014-01-06 DIAGNOSIS — G2581 Restless legs syndrome: Secondary | ICD-10-CM

## 2014-01-06 DIAGNOSIS — R404 Transient alteration of awareness: Secondary | ICD-10-CM

## 2014-01-06 DIAGNOSIS — R0609 Other forms of dyspnea: Secondary | ICD-10-CM

## 2014-01-06 NOTE — Patient Instructions (Addendum)
We will do a sleep study to further delineate your sleepiness.   Please remember to try to maintain good sleep hygiene, which means: Keep a regular sleep and wake schedule, try not to exercise or have a meal within 2 hours of your bedtime, try to keep your bedroom conducive for sleep, that is, cool and dark, without light distractors such as an illuminated alarm clock, and refrain from watching TV right before sleep or in the middle of the night and do not keep the TV or radio on during the night. Also, try not to use or play on electronic devices at bedtime, such as your cell phone, tablet PC or laptop. If you like to read at bedtime on an electronic device, try to dim the background light as much as possible. Do not eat in the middle of the night.

## 2014-01-06 NOTE — Progress Notes (Signed)
Subjective:    Patient ID: Helen Greene is a 43 y.o. female.  HPI    [name], MD, PhD [facility] [address] [address] [place], [place] [address]  Dear Helen Greene,  I saw your patient, Helen Greene, upon your kind request in my neurologic clinic today for initial consultation of her sleep disturbance, in particular, concern for underlying sleep apnea in the context of snoring and daytime somnolence reported. The patient is unaccompanied today. As you know, [name] is a 43 year old right-handed woman with an underlying medical history of reflux disease, obesity, depression and anxiety, leg pain and stiffness, who reports EDS for the past 7-8 years. Her ESS is 16/24. She reports loud snoring. She has not been told, that she has apneas. She does endorse occasional RLS symptoms and moves a lot in her sleep. She has been suffering from night sweats lately. She has occasional morning headaches, which are slight. She has reflux symptoms and has to take omeprazole 2 times a day. She is recently divorced.   Her typical bedtime is reported to be around 10:30 PM and usual wake time is around 6:40 to 7 AM. Sleep onset typically occurs within minutes. She reports feeling poorly rested upon awakening. She wakes up on an average 1 times in the middle of the night and has to go to the bathroom 1 times on a typical night.  She has been known to snore for the past few years. Snoring is reportedly moderate.  She denies cataplexy, sleep paralysis, hypnagogic or hypnopompic hallucinations, or sleep attacks. She does not report any vivid dreams, nightmares, dream enactments, or parasomnias, such as sleep talking or sleep walking. The patient has not had a sleep study or a home sleep test.  She consumes 0 caffeinated beverages per day. She drinks alcohol occasionally and does not smoke.  Her bedroom is usually dark and cool. There is no TV in the bedroom and usually her cat sleeps  in the bed.   Her Past Medical History Is Significant For: Past Medical History  Diagnosis Date  . Elevated white blood cell count   . Anxiety   . GERD (gastroesophageal reflux disease)   . Elevated white blood cell count 09/25/2011    Her Past Surgical History Is Significant For: Past Surgical History  Procedure Laterality Date  . Cholecystectomy    . Ectopic pregnancy surgery    . Wisdom tooth extraction    . Dilation and curettage of uterus  1993  . Essure tubal ligation      Her Family History Is Significant For: Family History  Problem Relation Age of Onset  . Hypertension Mother   . Heart failure Mother   . Heart disease Brother   . Cancer Father     Leukemia  . Hypertension Father     Her Social History Is Significant For: History   Social History  . Marital Status: Married    Spouse Name: N/A    Number of Children: N/A  . Years of Education: N/A   Social History Main Topics  . Smoking status: Never Smoker   . Smokeless tobacco: Never Used  . Alcohol Use: No  . Drug Use: No  . Sexual Activity: Yes   Other Topics Concern  . None   Social History Narrative  . None    Her Allergies Are:  Allergies  Allergen Reactions  . Enablex [Darifenacin Hydrobromide] Other (See Comments)    Phlebitis.  . Erythromycin  Hives  . Nitrofuran Derivatives Nausea And Vomiting  :   Her Current Medications Are:  Outpatient Encounter Prescriptions as of 01/06/2014  Medication Sig  . amitriptyline (ELAVIL) 10 MG tablet Take 10 mg by mouth at bedtime.  Marland Kitchen. buPROPion (WELLBUTRIN SR) 150 MG 12 hr tablet Take 150 mg by mouth daily.  . clonazePAM (KLONOPIN) 1 MG tablet Take 1 mg by mouth at bedtime as needed.  Marland Kitchen. escitalopram (LEXAPRO) 10 MG tablet Take 10 mg by mouth daily.  Marland Kitchen. etodolac (LODINE) 500 MG tablet Take 500 mg by mouth as needed.  . hydrOXYzine (ATARAX/VISTARIL) 25 MG tablet Take 25 mg by mouth 3 (three) times daily as needed for anxiety.  Marland Kitchen. ibuprofen  (ADVIL,MOTRIN) 800 MG tablet Take 800 mg by mouth every 8 (eight) hours as needed.  . lamoTRIgine (LAMICTAL) 200 MG tablet Take 200 mg by mouth daily.  . meloxicam (MOBIC) 7.5 MG tablet Take 1 tablet (7.5 mg total) by mouth 2 (two) times daily after a meal.  . methocarbamol (ROBAXIN) 500 MG tablet Take 1 tablet (500 mg total) by mouth 2 (two) times daily. As muscle relaxer  . Omeprazole (PRILOSEC PO) Take 20 mg by mouth 2 (two) times daily.   Marland Kitchen. oxyCODONE-acetaminophen (PERCOCET/ROXICET) 5-325 MG per tablet Take 1 tablet by mouth every 4 (four) hours as needed for severe pain.  . methocarbamol (ROBAXIN) 750 MG tablet Take 750 mg by mouth 4 (four) times daily.  . nabumetone (RELAFEN) 750 MG tablet Take 750 mg by mouth as needed.  . naratriptan (AMERGE) 2.5 MG tablet Take 2.5 mg by mouth as needed for migraine. Take one (1) tablet at onset of headache; if returns or does not resolve, may repeat after 4 hours; do not exceed five (5) mg in 24 hours.  . promethazine (PHENERGAN) 25 MG suppository Place 25 mg rectally every 6 (six) hours as needed for nausea or vomiting.  . sulfamethoxazole-trimethoprim (BACTRIM DS) 800-160 MG per tablet Take 1 tablet by mouth 2 (two) times daily.  . valACYclovir (VALTREX) 500 MG tablet Take 500 mg by mouth 2 (two) times daily.  . [DISCONTINUED] FLUoxetine (PROZAC) 40 MG capsule Take 40 mg by mouth daily.  :  Review of Systems:  Out of a complete 14 point review of systems, all are reviewed and negative with the exception of these symptoms as listed below:   Review of Systems  Constitutional: Positive for activity change and fatigue.  HENT: Positive for tinnitus.   Eyes: Negative.   Respiratory: Negative.   Cardiovascular: Positive for leg swelling.  Gastrointestinal: Negative.   Endocrine: Positive for cold intolerance.  Genitourinary: Positive for hematuria.  Musculoskeletal: Positive for myalgias.       Muscle cramps  Skin: Negative.   Allergic/Immunologic:  Positive for environmental allergies.  Neurological: Positive for tremors and headaches.       Memory loss  Hematological: Negative.   Psychiatric/Behavioral: Positive for sleep disturbance (eds, snoring, restless legs), dysphoric mood and decreased concentration. The patient is nervous/anxious.     Objective:  Neurologic Exam  Physical Exam Physical Examination:   Filed Vitals:   01/06/14 0939  BP: 127/84  Pulse: 99  Temp: 99.1 F (37.3 C)    General Examination: The patient is a very pleasant 43 y.o. female in no acute distress. She appears well-developed and well-nourished and well groomed.   HEENT: Normocephalic, atraumatic, pupils are equal, round and reactive to light and accommodation. Funduscopic exam is normal with sharp disc margins noted. Extraocular tracking  is good without limitation to gaze excursion or nystagmus noted. Normal smooth pursuit is noted. Hearing is grossly intact. Tympanic membranes are clear bilaterally. Face is symmetric with normal facial animation and normal facial sensation. Speech is clear with no dysarthria noted. There is no hypophonia. There is no lip, neck/head, jaw or voice tremor. Neck is supple with full range of passive and active motion. There are no carotid bruits on auscultation. Oropharynx exam reveals: mild mouth dryness, adequate dental hygiene and mild to moderate airway crowding, due to larger tongue, longer uvula and tonsils in place. Mallampati is class II. Tongue protrudes centrally and palate elevates symmetrically. Tonsils are 1+ in size, R>L. Neck size is 13 7/8 inches.   Chest: Clear to auscultation without wheezing, rhonchi or crackles noted.  Heart: S1+S2+0, regular and normal without murmurs, rubs or gallops noted.   Abdomen: Soft, non-tender and non-distended with normal bowel sounds appreciated on auscultation.  Extremities: There is no pitting edema in the distal lower extremities bilaterally. Pedal pulses are  intact.  Skin: Warm and dry without trophic changes noted. There are no varicose veins.  Musculoskeletal: exam reveals no obvious joint deformities, tenderness or joint swelling or erythema.   Neurologically:  Mental status: The patient is awake, alert and oriented in all 4 spheres. Her immediate and remote memory, attention, language skills and fund of knowledge are appropriate. There is no evidence of aphasia, agnosia, apraxia or anomia. Speech is clear with normal prosody and enunciation. Thought process is linear. Mood is normal and affect is normal.  Cranial nerves II - XII are as described above under HEENT exam. In addition: shoulder shrug is normal with equal shoulder height noted. Motor exam: Normal bulk, strength and tone is noted. There is no drift, tremor or rebound. Romberg is negative. Reflexes are 2+ throughout. Babinski: Toes are flexor bilaterally. Fine motor skills and coordination: intact with normal finger taps, normal hand movements, normal rapid alternating patting, normal foot taps and normal foot agility.  Cerebellar testing: No dysmetria or intention tremor on finger to nose testing. Heel to shin is unremarkable bilaterally. There is no truncal or gait ataxia.  Sensory exam: intact to light touch, pinprick, vibration, temperature sense in the upper and lower extremities.  Gait, station and balance: She stands easily. No veering to one side is noted. No leaning to one side is noted. Posture is age-appropriate and stance is narrow based. Gait shows normal stride length and normal pace. No problems turning are noted. She turns en bloc. Tandem walk is unremarkable. Intact toe and heel stance is noted.               Assessment and Plan:   In summary, JADON RESSLER is a very pleasant 43 y.o.-year old female with an underlying medical history of reflux disease, obesity, depression and anxiety, leg pain and stiffness, who reports snoring, EDS, RLS symptoms, and some morning  headaches, and some leg movements in sleep. Overall there is concern for OSA and RLS with PLMs, but as far as the degree of sleepiness is concerned medication effect from her multiple psychotropic medications. She currently is not taking percocet (was given to her for Esure procedure in march). I had a long chat with the patient about my findings and the diagnosis of OSA, its prognosis and treatment options. We talked about medical treatments, surgical interventions and non-pharmacological approaches. I explained in particular the risks and ramifications of untreated moderate to severe OSA, especially with respect to developing cardiovascular  disease down the Road, including congestive heart failure, difficult to treat hypertension, cardiac arrhythmias, or stroke. Even type 2 diabetes has, in part, been linked to untreated OSA. Symptoms of untreated OSA include daytime sleepiness, memory problems, mood irritability and mood disorder such as depression and anxiety, lack of energy, as well as recurrent headaches, especially morning headaches. We talked about trying to maintain a healthy lifestyle in general, as well as the importance of weight control. I encouraged the patient to eat healthy, exercise daily and keep well hydrated, to keep a scheduled bedtime and wake time routine, to not skip any meals and eat healthy snacks in between meals. I advised the patient not to drive when feeling sleepy. I recommended the following at this time: sleep study with potential positive airway pressure titration.  I explained the sleep test procedure to the patient and also outlined possible surgical and non-surgical treatment options of OSA, including the use of a custom-made dental device (which would require a referral to a specialist dentist or oral surgeon), upper airway surgical options, such as pillar implants, radiofrequency surgery, tongue base surgery, and UPPP (which would involve a referral to an ENT surgeon).  Rarely, jaw surgery such as mandibular advancement may be considered.  I also explained the CPAP treatment option to the patient, who indicated that she would be willing to try CPAP if the need arises. I explained the importance of being compliant with PAP treatment, not only for insurance purposes but primarily to improve Her symptoms, and for the patient's long term health benefit, including to reduce Her cardiovascular risks. I answered all her questions today and the patient was in agreement. I would like to see her back after the sleep study is completed and encouraged her to call with any interim questions, concerns, problems or updates.   Thank you very much for allowing me to participate in the care of this nice patient. If I can be of any further assistance to you please do not hesitate to call me at [phone].  Sincerely,   [name], MD, PhD

## 2014-01-13 ENCOUNTER — Other Ambulatory Visit (HOSPITAL_COMMUNITY): Payer: Self-pay | Admitting: Obstetrics

## 2014-01-13 DIAGNOSIS — Z308 Encounter for other contraceptive management: Secondary | ICD-10-CM

## 2014-01-21 ENCOUNTER — Ambulatory Visit (HOSPITAL_COMMUNITY)
Admission: RE | Admit: 2014-01-21 | Discharge: 2014-01-21 | Disposition: A | Discharge status: Home / Self Care | Payer: 59 | Source: Ambulatory Visit | Attending: Obstetrics | Admitting: Obstetrics

## 2014-01-21 DIAGNOSIS — Z308 Encounter for other contraceptive management: Secondary | ICD-10-CM | POA: Diagnosis not present

## 2014-01-21 MED ORDER — IOHEXOL 300 MG/ML  SOLN
20.0000 mL | Freq: Once | INTRAMUSCULAR | Status: AC | PRN
  Administered 2014-01-21: 4 mL

## 2014-02-17 ENCOUNTER — Ambulatory Visit (INDEPENDENT_AMBULATORY_CARE_PROVIDER_SITE_OTHER): Payer: 59 | Admitting: Neurology

## 2014-02-17 DIAGNOSIS — R519 Headache, unspecified: Secondary | ICD-10-CM

## 2014-02-17 DIAGNOSIS — G2581 Restless legs syndrome: Secondary | ICD-10-CM

## 2014-02-17 DIAGNOSIS — R51 Headache: Secondary | ICD-10-CM

## 2014-02-17 DIAGNOSIS — G4761 Periodic limb movement disorder: Secondary | ICD-10-CM

## 2014-02-17 DIAGNOSIS — R0683 Snoring: Secondary | ICD-10-CM

## 2014-02-17 DIAGNOSIS — R400 Somnolence: Secondary | ICD-10-CM

## 2014-03-04 ENCOUNTER — Telehealth: Payer: Self-pay | Admitting: Neurology

## 2014-03-04 NOTE — Telephone Encounter (Signed)
Please call and notify the patient that the recent sleep study did not show any significant obstructive sleep apnea, except for when she sleeps on her back. However, there were significant leg movements in sleep, which can be seen in patients with restless leg syndrome. Please inform patient that I would like to go over the details of the study during a follow up appointment and if not already previously scheduled, arrange a followup appointment (please utilize a followu-up slot). Also, route or fax report to PCP and referring MD, if other than PCP.  Once you have spoken to patient, you can close this encounter.   Thanks,  [name], MD, PhD [facility])

## 2014-04-13 ENCOUNTER — Encounter (HOSPITAL_COMMUNITY): Payer: Self-pay | Admitting: Emergency Medicine

## 2014-04-13 ENCOUNTER — Emergency Department (HOSPITAL_COMMUNITY): Payer: 59

## 2014-04-13 ENCOUNTER — Emergency Department (HOSPITAL_COMMUNITY)
Admission: EM | Admit: 2014-04-13 | Discharge: 2014-04-13 | Disposition: A | Discharge status: Home / Self Care | Payer: 59 | Attending: Emergency Medicine | Admitting: Emergency Medicine

## 2014-04-13 DIAGNOSIS — Z3202 Encounter for pregnancy test, result negative: Secondary | ICD-10-CM | POA: Diagnosis not present

## 2014-04-13 DIAGNOSIS — K219 Gastro-esophageal reflux disease without esophagitis: Secondary | ICD-10-CM | POA: Diagnosis not present

## 2014-04-13 DIAGNOSIS — Z79899 Other long term (current) drug therapy: Secondary | ICD-10-CM | POA: Diagnosis not present

## 2014-04-13 DIAGNOSIS — Z9889 Other specified postprocedural states: Secondary | ICD-10-CM | POA: Insufficient documentation

## 2014-04-13 DIAGNOSIS — N200 Calculus of kidney: Secondary | ICD-10-CM

## 2014-04-13 DIAGNOSIS — Z9089 Acquired absence of other organs: Secondary | ICD-10-CM | POA: Insufficient documentation

## 2014-04-13 DIAGNOSIS — F418 Other specified anxiety disorders: Secondary | ICD-10-CM | POA: Insufficient documentation

## 2014-04-13 DIAGNOSIS — Z9851 Tubal ligation status: Secondary | ICD-10-CM | POA: Insufficient documentation

## 2014-04-13 DIAGNOSIS — R1032 Left lower quadrant pain: Secondary | ICD-10-CM | POA: Diagnosis present

## 2014-04-13 DIAGNOSIS — Z862 Personal history of diseases of the blood and blood-forming organs and certain disorders involving the immune mechanism: Secondary | ICD-10-CM | POA: Insufficient documentation

## 2014-04-13 DIAGNOSIS — R319 Hematuria, unspecified: Secondary | ICD-10-CM | POA: Diagnosis not present

## 2014-04-13 LAB — CBC WITH DIFFERENTIAL/PLATELET
BASOS ABS: 0 10*3/uL (ref 0.0–0.1)
Basophils Relative: 0 % (ref 0–1)
EOS PCT: 1 % (ref 0–5)
Eosinophils Absolute: 0.1 10*3/uL (ref 0.0–0.7)
HCT: 42.3 % (ref 36.0–46.0)
Hemoglobin: 14.4 g/dL (ref 12.0–15.0)
LYMPHS ABS: 2.7 10*3/uL (ref 0.7–4.0)
Lymphocytes Relative: 20 % (ref 12–46)
MCH: 31.7 pg (ref 26.0–34.0)
MCHC: 34 g/dL (ref 30.0–36.0)
MCV: 93.2 fL (ref 78.0–100.0)
Monocytes Absolute: 1 10*3/uL (ref 0.1–1.0)
Monocytes Relative: 8 % (ref 3–12)
Neutro Abs: 9.3 10*3/uL — ABNORMAL HIGH (ref 1.7–7.7)
Neutrophils Relative %: 71 % (ref 43–77)
Platelets: 316 10*3/uL (ref 150–400)
RBC: 4.54 MIL/uL (ref 3.87–5.11)
RDW: 12.4 % (ref 11.5–15.5)
WBC: 13.1 10*3/uL — ABNORMAL HIGH (ref 4.0–10.5)

## 2014-04-13 LAB — URINALYSIS, ROUTINE W REFLEX MICROSCOPIC
BILIRUBIN URINE: NEGATIVE
Glucose, UA: NEGATIVE mg/dL
KETONES UR: NEGATIVE mg/dL
Nitrite: NEGATIVE
PROTEIN: NEGATIVE mg/dL
Specific Gravity, Urine: 1.029 (ref 1.005–1.030)
Urobilinogen, UA: 0.2 mg/dL (ref 0.0–1.0)
pH: 5.5 (ref 5.0–8.0)

## 2014-04-13 LAB — COMPREHENSIVE METABOLIC PANEL
ALT: 22 U/L (ref 0–35)
AST: 26 U/L (ref 0–37)
Albumin: 3.7 g/dL (ref 3.5–5.2)
Alkaline Phosphatase: 88 U/L (ref 39–117)
Anion gap: 13 (ref 5–15)
BILIRUBIN TOTAL: 0.2 mg/dL — AB (ref 0.3–1.2)
BUN: 17 mg/dL (ref 6–23)
CALCIUM: 8.7 mg/dL (ref 8.4–10.5)
CO2: 22 meq/L (ref 19–32)
CREATININE: 1 mg/dL (ref 0.50–1.10)
Chloride: 104 mEq/L (ref 96–112)
GFR, EST AFRICAN AMERICAN: 78 mL/min — AB (ref >90)
GFR, EST NON AFRICAN AMERICAN: 67 mL/min — AB (ref >90)
GLUCOSE: 94 mg/dL (ref 70–99)
Potassium: 4.3 mEq/L (ref 3.7–5.3)
Sodium: 139 mEq/L (ref 137–147)
Total Protein: 7.1 g/dL (ref 6.0–8.3)

## 2014-04-13 LAB — URINE MICROSCOPIC-ADD ON

## 2014-04-13 LAB — POC URINE PREG, ED: Preg Test, Ur: NEGATIVE

## 2014-04-13 LAB — LIPASE, BLOOD: LIPASE: 69 U/L — AB (ref 11–59)

## 2014-04-13 MED ORDER — ACETAMINOPHEN 325 MG PO TABS
650.0000 mg | ORAL_TABLET | Freq: Once | ORAL | Status: AC
  Administered 2014-04-13: 650 mg via ORAL
  Filled 2014-04-13: qty 2

## 2014-04-13 MED ORDER — SODIUM CHLORIDE 0.9 % IV BOLUS (SEPSIS)
1000.0000 mL | INTRAVENOUS | Status: AC
Start: 2014-04-13 — End: 2014-04-13
  Administered 2014-04-13: 1000 mL via INTRAVENOUS

## 2014-04-13 MED ORDER — OXYCODONE-ACETAMINOPHEN 5-325 MG PO TABS: 1.0000 | ORAL_TABLET | ORAL | Status: DC | PRN

## 2014-04-13 MED ORDER — ONDANSETRON HCL 4 MG/2ML IJ SOLN: 4.0000 mg | Freq: Once | INTRAMUSCULAR | Status: DC

## 2014-04-13 MED ORDER — HYDROMORPHONE HCL 1 MG/ML IJ SOLN
1.0000 mg | INTRAMUSCULAR | Status: AC
  Administered 2014-04-13: 1 mg via INTRAVENOUS
  Filled 2014-04-13: qty 1

## 2014-04-13 MED ORDER — ONDANSETRON HCL 4 MG/2ML IJ SOLN
4.0000 mg | INTRAMUSCULAR | Status: AC
  Administered 2014-04-13: 4 mg via INTRAVENOUS
  Filled 2014-04-13: qty 2

## 2014-04-13 MED ORDER — ONDANSETRON HCL 4 MG/2ML IJ SOLN
4.0000 mg | Freq: Once | INTRAMUSCULAR | Status: AC
  Administered 2014-04-13: 4 mg via INTRAVENOUS
  Filled 2014-04-13: qty 2

## 2014-04-13 MED ORDER — PROCHLORPERAZINE EDISYLATE 5 MG/ML IJ SOLN
10.0000 mg | Freq: Once | INTRAMUSCULAR | Status: AC
  Administered 2014-04-13: 10 mg via INTRAVENOUS
  Filled 2014-04-13: qty 2

## 2014-04-13 MED ORDER — HYDROMORPHONE HCL 1 MG/ML IJ SOLN
1.0000 mg | Freq: Once | INTRAMUSCULAR | Status: AC
  Administered 2014-04-13: 1 mg via INTRAVENOUS
  Filled 2014-04-13: qty 1

## 2014-04-13 MED ORDER — SODIUM CHLORIDE 0.9 % IV BOLUS (SEPSIS): 1000.0000 mL | Freq: Once | INTRAVENOUS | Status: DC

## 2014-04-13 MED ORDER — ONDANSETRON HCL 4 MG PO TABS: 4.0000 mg | ORAL_TABLET | Freq: Four times a day (QID) | ORAL | Status: DC

## 2014-04-13 MED ORDER — DIPHENHYDRAMINE HCL 50 MG/ML IJ SOLN
25.0000 mg | Freq: Once | INTRAMUSCULAR | Status: AC
  Administered 2014-04-13: 25 mg via INTRAVENOUS
  Filled 2014-04-13: qty 1

## 2014-04-13 MED ORDER — HYDROMORPHONE HCL 1 MG/ML IJ SOLN: 1.0000 mg | Freq: Once | INTRAMUSCULAR | Status: DC

## 2014-04-13 NOTE — ED Notes (Signed)
Patient transported to CT 

## 2014-04-13 NOTE — Discharge Instructions (Signed)
Please follow the directions provided.  Be sure to follow-up with the urology referral provided for management of this stone.  You may use the pain medicines as directed for pain and nausea to help until you can see the urologist for treatment.  Don't hesitate to return for new, worsening or concerning symptoms.    SEEK IMMEDIATE MEDICAL CARE IF:  Pain cannot be controlled with the prescribed medicine.  You have a fever or shaking chills.  The severity or intensity of pain increases over 18 hours and is not relieved by pain medicine.  You develop a new onset of abdominal pain.  You feel faint or pass out.  You are unable to urinate.

## 2014-04-13 NOTE — ED Notes (Signed)
Per pt sts left flank pain and hematuria x 1 week. sts has been taking pain meds without relief. sts some nausea.

## 2014-04-13 NOTE — ED Notes (Signed)
Urine specimen was still in mini-lab; sent down to lab for urinalysis.

## 2014-04-13 NOTE — ED Provider Notes (Signed)
CSN: [id number]     Arrival date & time 04/13/14  1348 History   None    Chief Complaint  Patient presents with  . Flank Pain   (Consider location/radiation/quality/duration/timing/severity/associated sxs/prior Treatment) HPI Helen Greene is a 43 yo female presenting with left flank pain and noted with urination that the toilet water was red. She reports these symptoms happened years ago when she had her kidney stone. The pain resolved that day and did not return until 1 day ago, when she began having constant left flank pain and nausea.  She describes the pain as sharp stabbing pain and rates it as 10/10 at that time.  The pain radiates from her left back around her flank to her LLQ.  She denies any fever, chills, vomiting, vaginal discharge or diarrhea.  Past Medical History  Diagnosis Date  . Elevated white blood cell count   . Anxiety   . GERD (gastroesophageal reflux disease)   . Elevated white blood cell count 09/25/2011   Past Surgical History  Procedure Laterality Date  . Cholecystectomy    . Ectopic pregnancy surgery    . Wisdom tooth extraction    . Dilation and curettage of uterus  1993  . Essure tubal ligation     Family History  Problem Relation Age of Onset  . Hypertension Mother   . Heart failure Mother   . Heart disease Brother   . Cancer Father     Leukemia  . Hypertension Father    History  Substance Use Topics  . Smoking status: Never Smoker   . Smokeless tobacco: Never Used  . Alcohol Use: No   OB History   Grav Para Term Preterm Abortions TAB SAB Ect Mult Living                 Review of Systems  Constitutional: Negative for fever and chills.  HENT: Negative for sore throat.   Eyes: Negative for visual disturbance.  Respiratory: Negative for cough and shortness of breath.   Cardiovascular: Negative for chest pain and leg swelling.  Gastrointestinal: Positive for nausea. Negative for vomiting and diarrhea.  Genitourinary: Positive for  hematuria and flank pain. Negative for dysuria and vaginal bleeding.  Musculoskeletal: Negative for myalgias.  Skin: Negative for rash.  Neurological: Negative for weakness, numbness and headaches.    Allergies  Bee venom; Enablex; Erythromycin; and Nitrofuran derivatives  Home Medications   Prior to Admission medications   Medication Sig Start Date End Date Taking? Authorizing Provider  buPROPion (WELLBUTRIN XL) 150 MG 24 hr tablet Take 150 mg by mouth daily.   Yes Historical Provider, MD  clonazePAM (KLONOPIN) 1 MG tablet Take 0.5-1 mg by mouth at bedtime as needed for anxiety.    Yes Historical Provider, MD  escitalopram (LEXAPRO) 10 MG tablet Take 10 mg by mouth daily.   Yes Historical Provider, MD  etodolac (LODINE) 500 MG tablet Take 500 mg by mouth 2 (two) times daily as needed (for headache).    Yes Historical Provider, MD  hydrOXYzine (ATARAX/VISTARIL) 25 MG tablet Take 25-50 mg by mouth 3 (three) times daily as needed for anxiety.    Yes Historical Provider, MD  ibuprofen (ADVIL,MOTRIN) 200 MG tablet Take 800 mg by mouth every 6 (six) hours as needed for headache.   Yes Historical Provider, MD  ibuprofen (ADVIL,MOTRIN) 800 MG tablet Take 800 mg by mouth every 8 (eight) hours as needed for headache.    Yes Historical Provider, MD  lamoTRIgine (LAMICTAL)  200 MG tablet Take 200 mg by mouth at bedtime.    Yes Historical Provider, MD  meloxicam (MOBIC) 7.5 MG tablet Take 7.5 mg by mouth 2 (two) times daily as needed for pain.   Yes Historical Provider, MD  methocarbamol (ROBAXIN) 500 MG tablet Take 1 tablet (500 mg total) by mouth 2 (two) times daily. As muscle relaxer 12/15/13  Yes Linna HoffJames D Kindl, MD  naratriptan (AMERGE) 2.5 MG tablet Take 2.5 mg by mouth as needed for migraine. Take one (1) tablet at onset of headache; if returns or does not resolve, may repeat after 4 hours; do not exceed five (5) mg in 24 hours.   Yes Historical Provider, MD  Omeprazole (PRILOSEC PO) Take 20 mg by  mouth 2 (two) times daily.    Yes Historical Provider, MD  oxyCODONE-acetaminophen (PERCOCET/ROXICET) 5-325 MG per tablet Take 1-2 tablets by mouth every 4 (four) hours as needed for moderate pain.    Yes Historical Provider, MD  PRESCRIPTION MEDICATION continuous. ESURE BIRTH CONTROL- surgical implant   Yes Historical Provider, MD  promethazine (PHENERGAN) 25 MG tablet Take 25 mg by mouth every 6 (six) hours as needed for nausea or vomiting.   Yes Historical Provider, MD  valACYclovir (VALTREX) 500 MG tablet Take 500 mg by mouth 2 (two) times daily as needed (for cold sores).    Yes Historical Provider, MD   BP 136/92  Pulse 97  Temp(Src) 98.2 F (36.8 C) (Oral)  Resp 18  SpO2 99% Physical Exam  Nursing note and vitals reviewed. Constitutional: She appears well-developed and well-nourished. No distress.  HENT:  Head: Normocephalic and atraumatic.  Mouth/Throat: Oropharynx is clear and moist. No oropharyngeal exudate.  Eyes: Conjunctivae are normal.  Neck: Neck supple. No thyromegaly present.  Cardiovascular: Normal rate, regular rhythm and intact distal pulses.   Pulmonary/Chest: Effort normal and breath sounds normal. No respiratory distress. She has no wheezes. She has no rales. She exhibits no tenderness.  Abdominal: Soft. Normal appearance and bowel sounds are normal. She exhibits no distension and no mass. There is no hepatosplenomegaly. There is tenderness in the suprapubic area. There is CVA tenderness. There is no rebound, no guarding, no tenderness at McBurney's point and negative Murphy's sign.    Musculoskeletal: She exhibits no tenderness.  Lymphadenopathy:    She has no cervical adenopathy.  Neurological: She is alert.  Skin: Skin is warm and dry. No rash noted. She is not diaphoretic.  Psychiatric: She has a normal mood and affect.    ED Course  Procedures (including critical care time) Labs Review Labs Reviewed  CBC WITH DIFFERENTIAL - Abnormal; Notable for the  following:    WBC 13.1 (*)    Neutro Abs 9.3 (*)    All other components within normal limits  COMPREHENSIVE METABOLIC PANEL - Abnormal; Notable for the following:    Total Bilirubin 0.2 (*)    GFR calc non Af Amer 67 (*)    GFR calc Af Amer 78 (*)    All other components within normal limits  LIPASE, BLOOD - Abnormal; Notable for the following:    Lipase 69 (*)    All other components within normal limits  URINALYSIS, ROUTINE W REFLEX MICROSCOPIC - Abnormal; Notable for the following:    APPearance CLOUDY (*)    Hgb urine dipstick LARGE (*)    Leukocytes, UA TRACE (*)    All other components within normal limits  URINE MICROSCOPIC-ADD ON - Abnormal; Notable for the following:  Squamous Epithelial / LPF MANY (*)    Bacteria, UA FEW (*)    All other components within normal limits  POC URINE PREG, ED    Imaging Review Ct Renal Stone Study  04/13/2014   CLINICAL DATA:  Left flank pain and hematuria for 1 week.  EXAM: CT ABDOMEN AND PELVIS WITHOUT CONTRAST  TECHNIQUE: Multidetector CT imaging of the abdomen and pelvis was performed following the standard protocol without IV contrast.  COMPARISON:  04/03/2011  FINDINGS: BODY WALL: Unremarkable.  LOWER CHEST: Unremarkable.  ABDOMEN/PELVIS:  Liver: No focal abnormality.  Biliary: Cholecystectomy. Prominent appearance of the hilar biliary tree likely reservoir effect.  Pancreas: Unremarkable.  Spleen: Unremarkable.  Adrenals: Unremarkable.  Kidneys and ureters: Left hydroureteronephrosis secondary to 4 mm stone in the distal left ureter, beyond the pelvic rim.  Bilateral nephrolithiasis. Calcification on the right is punctate. There is a cluster of stones in the lower pole on the left measuring up to 9 mm.  Bladder: Unremarkable.  Reproductive: Essure micro insert on the left. The tip is of the wire in not directed towards the ovary, and appear extraserosal. Cannot guarantee that the distal wire is within the fallopian tube. The left ovary is  unremarkable. Negative right adnexa.  Bowel: The cecum and appendix are mobile, residing in the left lower quadrant. No appendicitis. No bowel obstruction.  Retroperitoneum: No mass or adenopathy.  Peritoneum: No ascites or pneumoperitoneum.  Vascular: No acute abnormality.  OSSEOUS: No acute abnormalities.  IMPRESSION: 1. Obstructing 4 mm stone in the distal left ureter. 2. Bilateral nephrolithiasis, left greater than right. 3. Unexpected appearance of left Essure micro insert. See discussion above and reference hysterogram 01/21/2014.   Electronically Signed   By: Tiburcio Pea M.D.   On: 04/13/2014 17:24     EKG Interpretation None      MDM   Final diagnoses:  Hematuria  Renal stone   43 yo female with report of hematuria, flank pain similar to her kidney stone pain  NS bolus, pain and nausea meds and Renal stone study  Ct shows 4 mm obstructing stone,  BUN and creatinine normal, no indication of infected stone.  Will re-dose pain and nausea meds and plan is to discharge with urology referral.      7:25 PM Pt sleeping without distress, orders for vitals including O2 sats on room air and ambulate after pain meds.  8:20 Pt reports flank pain is fine but has a headache, and feels like her muscles are tense.  Compazine and benadryl ordered.   On re-peat exam, pt's pain resolved and she feels ready to go home.  Pt is well-appearing and in no acute distress.  Discharge instructions include prescription for pain and nausea meds, urine strainer and urology referral with instructions to call to arrange follow-up for further mgmt.  Pt aware of plan and in agreement.     Filed Vitals:   04/13/14 1822 04/13/14 1823 04/13/14 1900 04/13/14 1937  BP:   125/77 126/78  Pulse:   100 100  Temp:    97.7 F (36.5 C)  TempSrc:    Oral  Resp:   18 14  SpO2: 88% 98% 97% 99%   Meds given in ED:  Medications  HYDROmorphone (DILAUDID) injection 1 mg (1 mg Intravenous Given 04/13/14 1646)    ondansetron (ZOFRAN) injection 4 mg (4 mg Intravenous Given 04/13/14 1646)  sodium chloride 0.9 % bolus 1,000 mL (0 mLs Intravenous Stopped 04/13/14 1957)  HYDROmorphone (DILAUDID) injection 1  mg (1 mg Intravenous Given 04/13/14 1802)  acetaminophen (TYLENOL) tablet 650 mg (650 mg Oral Given 04/13/14 1801)  ondansetron (ZOFRAN) injection 4 mg (4 mg Intravenous Given 04/13/14 1802)  prochlorperazine (COMPAZINE) injection 10 mg (10 mg Intravenous Given 04/13/14 2026)  diphenhydrAMINE (BENADRYL) injection 25 mg (25 mg Intravenous Given 04/13/14 2025)    Discharge Medication List as of 04/13/2014  7:23 PM    START taking these medications   Details  ondansetron (ZOFRAN) 4 MG tablet Take 1 tablet (4 mg total) by mouth every 6 (six) hours., Starting 04/13/2014, Until Discontinued, Print    !! oxyCODONE-acetaminophen (PERCOCET/ROXICET) 5-325 MG per tablet Take 1-2 tablets by mouth every 4 (four) hours as needed for moderate pain or severe pain., Starting 04/13/2014, Until Discontinued, Print     !! - Potential duplicate medications found. Please discuss with provider.         Harle Battiest, NP 04/14/14 1350

## 2014-04-16 NOTE — ED Provider Notes (Signed)
Medical screening examination/treatment/procedure(s) were performed by non-physician practitioner and as supervising physician I was immediately available for consultation/collaboration.   EKG Interpretation None       Arby BarretteMarcy Cyera Balboni, MD 04/16/14 [phone]

## 2014-07-24 ENCOUNTER — Encounter (HOSPITAL_COMMUNITY): Payer: Self-pay | Admitting: Emergency Medicine

## 2014-07-24 ENCOUNTER — Emergency Department (HOSPITAL_COMMUNITY)
Admission: EM | Admit: 2014-07-24 | Discharge: 2014-07-24 | Disposition: A | Discharge status: Home / Self Care | Payer: 59 | Attending: Emergency Medicine | Admitting: Emergency Medicine

## 2014-07-24 DIAGNOSIS — Z3202 Encounter for pregnancy test, result negative: Secondary | ICD-10-CM | POA: Insufficient documentation

## 2014-07-24 DIAGNOSIS — N200 Calculus of kidney: Secondary | ICD-10-CM | POA: Insufficient documentation

## 2014-07-24 DIAGNOSIS — Z792 Long term (current) use of antibiotics: Secondary | ICD-10-CM | POA: Insufficient documentation

## 2014-07-24 DIAGNOSIS — Z79899 Other long term (current) drug therapy: Secondary | ICD-10-CM | POA: Insufficient documentation

## 2014-07-24 DIAGNOSIS — R42 Dizziness and giddiness: Secondary | ICD-10-CM | POA: Insufficient documentation

## 2014-07-24 DIAGNOSIS — K5900 Constipation, unspecified: Secondary | ICD-10-CM | POA: Insufficient documentation

## 2014-07-24 DIAGNOSIS — K219 Gastro-esophageal reflux disease without esophagitis: Secondary | ICD-10-CM | POA: Insufficient documentation

## 2014-07-24 DIAGNOSIS — Z87440 Personal history of urinary (tract) infections: Secondary | ICD-10-CM | POA: Diagnosis not present

## 2014-07-24 DIAGNOSIS — D72829 Elevated white blood cell count, unspecified: Secondary | ICD-10-CM | POA: Insufficient documentation

## 2014-07-24 DIAGNOSIS — R109 Unspecified abdominal pain: Secondary | ICD-10-CM | POA: Diagnosis present

## 2014-07-24 HISTORY — DX: Calculus of kidney: N20.0

## 2014-07-24 LAB — CBC WITH DIFFERENTIAL/PLATELET
Basophils Absolute: 0 10*3/uL (ref 0.0–0.1)
Basophils Relative: 0 % (ref 0–1)
EOS ABS: 0.2 10*3/uL (ref 0.0–0.7)
Eosinophils Relative: 1 % (ref 0–5)
HEMATOCRIT: 43 % (ref 36.0–46.0)
Hemoglobin: 14.5 g/dL (ref 12.0–15.0)
LYMPHS PCT: 20 % (ref 12–46)
Lymphs Abs: 2.3 10*3/uL (ref 0.7–4.0)
MCH: 31 pg (ref 26.0–34.0)
MCHC: 33.7 g/dL (ref 30.0–36.0)
MCV: 92.1 fL (ref 78.0–100.0)
Monocytes Absolute: 0.8 10*3/uL (ref 0.1–1.0)
Monocytes Relative: 7 % (ref 3–12)
NEUTROS ABS: 8.1 10*3/uL — AB (ref 1.7–7.7)
Neutrophils Relative %: 72 % (ref 43–77)
Platelets: 326 10*3/uL (ref 150–400)
RBC: 4.67 MIL/uL (ref 3.87–5.11)
RDW: 12.4 % (ref 11.5–15.5)
WBC: 11.3 10*3/uL — ABNORMAL HIGH (ref 4.0–10.5)

## 2014-07-24 LAB — URINALYSIS, ROUTINE W REFLEX MICROSCOPIC
Bilirubin Urine: NEGATIVE
Glucose, UA: NEGATIVE mg/dL
Ketones, ur: NEGATIVE mg/dL
Nitrite: NEGATIVE
Protein, ur: 30 mg/dL — AB
SPECIFIC GRAVITY, URINE: 1.027 (ref 1.005–1.030)
Urobilinogen, UA: 0.2 mg/dL (ref 0.0–1.0)
pH: 5.5 (ref 5.0–8.0)

## 2014-07-24 LAB — BASIC METABOLIC PANEL
ANION GAP: 8 (ref 5–15)
BUN: 16 mg/dL (ref 6–23)
CHLORIDE: 105 mmol/L (ref 96–112)
CO2: 25 mmol/L (ref 19–32)
Calcium: 9.3 mg/dL (ref 8.4–10.5)
Creatinine, Ser: 0.78 mg/dL (ref 0.50–1.10)
GFR calc Af Amer: >90 mL/min (ref >90)
GFR calc non Af Amer: >90 mL/min (ref >90)
GLUCOSE: 104 mg/dL — AB (ref 70–99)
Potassium: 4 mmol/L (ref 3.5–5.1)
SODIUM: 138 mmol/L (ref 135–145)

## 2014-07-24 LAB — URINE MICROSCOPIC-ADD ON

## 2014-07-24 LAB — POC URINE PREG, ED: Preg Test, Ur: NEGATIVE

## 2014-07-24 MED ORDER — OXYCODONE-ACETAMINOPHEN 5-325 MG PO TABS: 1.0000 | ORAL_TABLET | Freq: Once | ORAL | Status: DC

## 2014-07-24 MED ORDER — OXYCODONE-ACETAMINOPHEN 5-325 MG PO TABS
2.0000 | ORAL_TABLET | Freq: Once | ORAL | Status: AC
  Administered 2014-07-24: 2 via ORAL
  Filled 2014-07-24: qty 2

## 2014-07-24 NOTE — ED Provider Notes (Signed)
CSN: [id number]     Arrival date & time 07/24/14  4098 History   First MD Initiated Contact with Patient 07/24/14 0940     Chief Complaint  Patient presents with  . Flank Pain     (Consider location/radiation/quality/duration/timing/severity/associated sxs/prior Treatment) HPI Comments: Pt comes to ED with concern of kidney stone "moving". Pt reports history of kidney stones, last noted in October 2015 on CT scan and referred to Urologist who said the stones needed to be broken up, but pt did not do this at the time because of inability to pay. She had no issues until last week, when she developed "irriation" with urination, went to urgent care and was diagnosed with UTI and given course of ciprofloxacin which she is still taking. Reports in the last few days worsening left sided flank pain, and yesterday developed a large amount of blood in her urine when she woke up, and smaller episode of blood in urine last night. Pain is "dull" and comes in waves of worsened pain. She has taken an oxycodone that she was prescribed with the last stone, and also aleve. The oxycodone did help with the pain, she says it didn't last time she had stone pain. She denies fevers, chills, some nausea but no vomiting.   Patient is a 43 y.o. female presenting with flank pain. The history is provided by the patient. No language interpreter was used.  Flank Pain This is a recurrent problem. The current episode started in the past 7 days. The problem occurs constantly. The problem has been gradually worsening. Associated symptoms include abdominal pain, nausea and urinary symptoms (had irritation last week, received UTI treatment). Pertinent negatives include no anorexia, arthralgias, chest pain, coughing, fever, headaches, neck pain, numbness, rash, vertigo, visual change or vomiting. The symptoms are aggravated by bending and twisting. She has tried oral narcotics, position changes, drinking and NSAIDs for the symptoms. The  treatment provided moderate relief.    Past Medical History  Diagnosis Date  . Elevated white blood cell count   . Anxiety   . GERD (gastroesophageal reflux disease)   . Elevated white blood cell count 09/25/2011  . Kidney stone    Past Surgical History  Procedure Laterality Date  . Cholecystectomy    . Ectopic pregnancy surgery    . Wisdom tooth extraction    . Dilation and curettage of uterus  1993  . Essure tubal ligation     Family History  Problem Relation Age of Onset  . Hypertension Mother   . Heart failure Mother   . Heart disease Brother   . Cancer Father     Leukemia  . Hypertension Father    History  Substance Use Topics  . Smoking status: Never Smoker   . Smokeless tobacco: Never Used  . Alcohol Use: No   OB History    No data available     Review of Systems  Constitutional: Negative for fever.  HENT: Negative.   Respiratory: Negative for cough and shortness of breath.   Cardiovascular: Negative for chest pain and leg swelling.  Gastrointestinal: Positive for nausea, abdominal pain and constipation. Negative for vomiting, diarrhea, blood in stool and anorexia.  Genitourinary: Positive for flank pain. Negative for dysuria, urgency, frequency, decreased urine volume and difficulty urinating.  Musculoskeletal: Negative for arthralgias and neck pain.  Skin: Negative for rash.  Neurological: Positive for dizziness. Negative for vertigo, numbness and headaches.  All other systems reviewed and are negative.     Allergies  Bee venom; Enablex; Erythromycin; and Nitrofuran derivatives  Home Medications   Prior to Admission medications   Medication Sig Start Date End Date Taking? Authorizing Provider  acetaminophen (TYLENOL) 500 MG tablet Take 500 mg by mouth every 6 (six) hours as needed for mild pain.   Yes Historical Provider, MD  buPROPion (WELLBUTRIN XL) 150 MG 24 hr tablet Take 150 mg by mouth every morning.    Yes Historical Provider, MD    ciprofloxacin (CIPRO) 500 MG tablet Take 500 mg by mouth 2 (two) times daily.   Yes Historical Provider, MD  clonazePAM (KLONOPIN) 1 MG tablet Take 0.5-1 mg by mouth at bedtime as needed for anxiety.    Yes Historical Provider, MD  escitalopram (LEXAPRO) 10 MG tablet Take 10 mg by mouth at bedtime.    Yes Historical Provider, MD  hydrOXYzine (ATARAX/VISTARIL) 25 MG tablet Take 25-50 mg by mouth 3 (three) times daily as needed for anxiety.    Yes Historical Provider, MD  ibuprofen (ADVIL,MOTRIN) 200 MG tablet Take 200-800 mg by mouth every 6 (six) hours as needed for headache.    Yes Historical Provider, MD  lamoTRIgine (LAMICTAL) 200 MG tablet Take 200 mg by mouth at bedtime.    Yes Historical Provider, MD  naproxen sodium (ANAPROX) 220 MG tablet Take 220 mg by mouth daily as needed (Mild pain).   Yes Historical Provider, MD  naratriptan (AMERGE) 2.5 MG tablet Take 2.5 mg by mouth as needed for migraine. Take one (1) tablet at onset of headache; if returns or does not resolve, may repeat after 4 hours; do not exceed five (5) mg in 24 hours.   Yes Historical Provider, MD  Omeprazole (PRILOSEC PO) Take 20 mg by mouth 2 (two) times daily.    Yes Historical Provider, MD  OVER THE COUNTER MEDICATION Take 1 tablet by mouth daily as needed (Cough/congestion). Mucinex   Yes Historical Provider, MD  methocarbamol (ROBAXIN) 500 MG tablet Take 1 tablet (500 mg total) by mouth 2 (two) times daily. As muscle relaxer 12/15/13   Linna HoffJames D Kindl, MD  ondansetron (ZOFRAN) 4 MG tablet Take 1 tablet (4 mg total) by mouth every 6 (six) hours. 04/13/14   Harle BattiestElizabeth Tysinger, NP  oxyCODONE-acetaminophen (PERCOCET/ROXICET) 5-325 MG per tablet Take 1-2 tablets by mouth once. 07/24/14   Nani RavensAndrew M Carlia Bomkamp, MD  valACYclovir (VALTREX) 500 MG tablet Take 500 mg by mouth 2 (two) times daily as needed (for cold sores).     Historical Provider, MD   BP 143/94 mmHg  Pulse 98  Temp(Src) 97.8 F (36.6 C) (Oral)  Resp 18  SpO2 98%  LMP  06/14/2014 Physical Exam  Constitutional: She is oriented to person, place, and time. She appears well-developed and well-nourished. No distress.  Comfortable in bed sitting up, laughing and joking with friend at bedside; pain when lowering bed to lay flat  HENT:  Head: Normocephalic and atraumatic.  Eyes: EOM are normal. Pupils are equal, round, and reactive to light.  Cardiovascular: Normal rate, regular rhythm, normal heart sounds and intact distal pulses.  Exam reveals no gallop and no friction rub.   No murmur heard. Pulmonary/Chest: Effort normal and breath sounds normal. No respiratory distress. She has no wheezes.  Abdominal: Soft. Bowel sounds are normal. She exhibits no distension and no mass. There is tenderness (LLQ). There is no rebound and no guarding.  Musculoskeletal: She exhibits no edema.       Arms: Back: left sided CVA tenderness  Neurological: She is alert  and oriented to person, place, and time.  Skin: Skin is warm and dry. No rash noted.  Psychiatric: She has a normal mood and affect. Judgment and thought content normal.  Nursing note and vitals reviewed.   ED Course  Procedures (including critical care time) Labs Review Labs Reviewed  URINALYSIS, ROUTINE W REFLEX MICROSCOPIC - Abnormal; Notable for the following:    APPearance CLOUDY (*)    Hgb urine dipstick LARGE (*)    Protein, ur 30 (*)    Leukocytes, UA SMALL (*)    All other components within normal limits  URINE MICROSCOPIC-ADD ON - Abnormal; Notable for the following:    Squamous Epithelial / LPF FEW (*)    Bacteria, UA MANY (*)    All other components within normal limits  BASIC METABOLIC PANEL - Abnormal; Notable for the following:    Glucose, Bld 104 (*)    All other components within normal limits  CBC WITH DIFFERENTIAL/PLATELET - Abnormal; Notable for the following:    WBC 11.3 (*)    Neutro Abs 8.1 (*)    All other components within normal limits  URINE CULTURE  POC URINE PREG, ED     Imaging Review No results found.   EKG Interpretation None      MDM   Final diagnoses:  Recurrent nephrolithiasis   UA with large hgb. BMP okay, kidney function normal, slightly elevated WBC but this appears to be chronic/baseline for her. Improved pain with percocet in ED. As known kidney stones on CT in October 2015, stable vitals, not intractable pain, stable for discharge with close urology follow up. No further imaging indicated at this time, given return precautions, work note, and percocet 5-325 #20 (pt already has some at home, also says she has flomax from last stone that recommended she can use in addition to oral hydration).   Nani Ravens, MD 07/24/14 1147  Suzi Roots, MD 07/24/14 1346

## 2014-07-24 NOTE — Discharge Instructions (Signed)
You are likely passing a kidney stone. Take the oxycodone-acetaminophen every 4-6 hours as needed for pain. You can also take aleve, ibuprofen as well. Drink plenty of fluids. It is very important for you to get an appointment with your urologist, call tomorrow when the office opens.  Kidney Stones Kidney stones (urolithiasis) are deposits that form inside your kidneys. The intense pain is caused by the stone moving through the urinary tract. When the stone moves, the ureter goes into spasm around the stone. The stone is usually passed in the urine.  CAUSES   A disorder that makes certain neck glands produce too much parathyroid hormone (primary hyperparathyroidism).  A buildup of uric acid crystals, similar to gout in your joints.  Narrowing (stricture) of the ureter.  A kidney obstruction present at birth (congenital obstruction).  Previous surgery on the kidney or ureters.  Numerous kidney infections. SYMPTOMS   Feeling sick to your stomach (nauseous).  Throwing up (vomiting).  Blood in the urine (hematuria).  Pain that usually spreads (radiates) to the groin.  Frequency or urgency of urination. DIAGNOSIS   Taking a history and physical exam.  Blood or urine tests.  CT scan.  Occasionally, an examination of the inside of the urinary bladder (cystoscopy) is performed. TREATMENT   Observation.  Increasing your fluid intake.  Extracorporeal shock wave lithotripsy--This is a noninvasive procedure that uses shock waves to break up kidney stones.  Surgery may be needed if you have severe pain or persistent obstruction. There are various surgical procedures. Most of the procedures are performed with the use of small instruments. Only small incisions are needed to accommodate these instruments, so recovery time is minimized. The size, location, and chemical composition are all important variables that will determine the proper choice of action for you. Talk to your health  care provider to better understand your situation so that you will minimize the risk of injury to yourself and your kidney.  HOME CARE INSTRUCTIONS   Drink enough water and fluids to keep your urine clear or pale yellow. This will help you to pass the stone or stone fragments.  Strain all urine through the provided strainer. Keep all particulate matter and stones for your health care provider to see. The stone causing the pain may be as small as a grain of salt. It is very important to use the strainer each and every time you pass your urine. The collection of your stone will allow your health care provider to analyze it and verify that a stone has actually passed. The stone analysis will often identify what you can do to reduce the incidence of recurrences.  Only take over-the-counter or prescription medicines for pain, discomfort, or fever as directed by your health care provider.  Make a follow-up appointment with your health care provider as directed.  Get follow-up X-rays if required. The absence of pain does not always mean that the stone has passed. It may have only stopped moving. If the urine remains completely obstructed, it can cause loss of kidney function or even complete destruction of the kidney. It is your responsibility to make sure X-rays and follow-ups are completed. Ultrasounds of the kidney can show blockages and the status of the kidney. Ultrasounds are not associated with any radiation and can be performed easily in a matter of minutes. SEEK MEDICAL CARE IF:  You experience pain that is progressive and unresponsive to any pain medicine you have been prescribed. SEEK IMMEDIATE MEDICAL CARE IF:   Pain  cannot be controlled with the prescribed medicine.  You have a fever or shaking chills.  The severity or intensity of pain increases over 18 hours and is not relieved by pain medicine.  You develop a new onset of abdominal pain.  You feel faint or pass out.  You are  unable to urinate. MAKE SURE YOU:   Understand these instructions.  Will watch your condition.  Will get help right away if you are not doing well or get worse. Document Released: 06/03/2005 Document Revised: 02/03/2013 Document Reviewed: 11/04/2012 [facility] Patient Information 2015 [place], [place]. This information is not intended to replace advice given to you by your health care provider. Make sure you discuss any questions you have with your health care provider.

## 2014-07-24 NOTE — ED Notes (Signed)
Pt arrived to ED with c/o left sided flank pain that radiates to left abdomen. Pt has hx of kidney stones in the past and stated that this feels like her past kidney stones. Symptoms started last week but has became worse. Stated that she has blood in her urine yesterday. Last week pt was tx for kidney infection.

## 2014-07-26 LAB — URINE CULTURE
COLONY COUNT: NO GROWTH
Culture: NO GROWTH

## 2014-08-25 ENCOUNTER — Ambulatory Visit (INDEPENDENT_AMBULATORY_CARE_PROVIDER_SITE_OTHER): Payer: 59 | Admitting: Family Medicine

## 2014-08-25 VITALS — BP 120/82 | HR 75 | Temp 98.3°F | Resp 12 | Ht 60.0 in | Wt 190.1 lb

## 2014-08-25 DIAGNOSIS — N898 Other specified noninflammatory disorders of vagina: Secondary | ICD-10-CM

## 2014-08-25 DIAGNOSIS — Z7251 High risk heterosexual behavior: Secondary | ICD-10-CM | POA: Diagnosis not present

## 2014-08-25 LAB — POCT WET PREP WITH KOH
KOH PREP POC: NEGATIVE
TRICHOMONAS UA: NEGATIVE
Yeast Wet Prep HPF POC: NEGATIVE

## 2014-08-25 MED ORDER — FLUCONAZOLE 150 MG PO TABS: 150.0000 mg | ORAL_TABLET | Freq: Once | ORAL | Status: DC

## 2014-08-25 MED ORDER — DOXYCYCLINE HYCLATE 100 MG PO CAPS: 100.0000 mg | ORAL_CAPSULE | Freq: Two times a day (BID) | ORAL | Status: DC

## 2014-08-25 MED ORDER — CEFTRIAXONE SODIUM 1 G IJ SOLR
500.0000 mg | Freq: Once | INTRAMUSCULAR | Status: AC
  Administered 2014-08-25: 500 mg via INTRAMUSCULAR

## 2014-08-25 NOTE — Progress Notes (Addendum)
Subjective:  This chart was scribed for Helen Simmer, MD by Elveria Rising, Medial Scribe. This patient was seen in room 11 and the patient's care was started at 6:11 PM.    Patient ID: Helen Greene, female    DOB: 1971/03/14, 44 y.o.   MRN: [id number]  08/25/2014  Vaginal Itching and Vaginal Odor   HPI HPI Comments: Helen Greene is a 44 y.o. female who presents to the [facility] complaining of vaginal irritation and itching.  Patient only reports mild vaginal discharge. Patient denies fever, abdominal pain, burning with urination, frequency,hematuria, or urgency.  Patient reports recent kidney infection treated with Cipro. And antibiotic treatment for an ear infection and tonsilitis a week later. Patient suspects she has developed a yeast infection. Patient reports treatment with OTC medications including Monistat with some improvement. Patient states that she is divorced, but reports a unprotected "sexual encounter" one month ago with a female partner. Patient agrees to STD testing. Patient's LMP dated at the end of the February, two weeks ago. Patient reports remote history of Chlamydia when first meeting her husband many years ago.   Review of Systems  Constitutional: Negative for fever, chills, diaphoresis and fatigue.  Gastrointestinal: Negative for nausea, vomiting and abdominal pain.  Genitourinary: Positive for vaginal discharge and vaginal pain. Negative for dysuria, urgency, frequency, hematuria, flank pain, genital sores, menstrual problem and pelvic pain.  Skin: Negative for rash.    Past Medical History  Diagnosis Date  . Elevated white blood cell count   . Anxiety   . GERD (gastroesophageal reflux disease)   . Elevated white blood cell count 09/25/2011  . Kidney stone   . Allergy   . Depression    Past Surgical History  Procedure Laterality Date  . Cholecystectomy    . Ectopic pregnancy surgery    . Wisdom tooth extraction    . Dilation and  curettage of uterus  1993  . Essure tubal ligation     Allergies  Allergen Reactions  . Bee Venom Hives and Swelling  . Enablex [Darifenacin Hydrobromide] Other (See Comments)    Phlebitis.  . Erythromycin Hives  . Nitrofuran Derivatives Nausea And Vomiting   Current Outpatient Prescriptions  Medication Sig Dispense Refill  . acetaminophen (TYLENOL) 500 MG tablet Take 500 mg by mouth every 6 (six) hours as needed for mild pain.    Marland Kitchen buPROPion (WELLBUTRIN XL) 150 MG 24 hr tablet Take 150 mg by mouth every morning.     . clonazePAM (KLONOPIN) 1 MG tablet Take 0.5-1 mg by mouth at bedtime as needed for anxiety.     Marland Kitchen escitalopram (LEXAPRO) 10 MG tablet Take 10 mg by mouth at bedtime.     . hydrOXYzine (ATARAX/VISTARIL) 25 MG tablet Take 25-50 mg by mouth 3 (three) times daily as needed for anxiety.     Marland Kitchen ibuprofen (ADVIL,MOTRIN) 200 MG tablet Take 200-800 mg by mouth every 6 (six) hours as needed for headache.     . lamoTRIgine (LAMICTAL) 200 MG tablet Take 200 mg by mouth at bedtime.     . naproxen sodium (ANAPROX) 220 MG tablet Take 220 mg by mouth daily as needed (Mild pain).    . naratriptan (AMERGE) 2.5 MG tablet Take 2.5 mg by mouth as needed for migraine. Take one (1) tablet at onset of headache; if returns or does not resolve, may repeat after 4 hours; do not exceed five (5) mg in 24 hours.    Marland Kitchen  Omeprazole (PRILOSEC PO) Take 20 mg by mouth 2 (two) times daily.     . ondansetron (ZOFRAN) 4 MG tablet Take 1 tablet (4 mg total) by mouth every 6 (six) hours. 20 tablet 0  . oxyCODONE-acetaminophen (PERCOCET/ROXICET) 5-325 MG per tablet Take 1-2 tablets by mouth once. 20 tablet 0  . valACYclovir (VALTREX) 500 MG tablet Take 500 mg by mouth 2 (two) times daily as needed (for cold sores).     Marland Kitchen. doxycycline (VIBRAMYCIN) 100 MG capsule Take 1 capsule (100 mg total) by mouth 2 (two) times daily. 20 capsule 0  . fluconazole (DIFLUCAN) 150 MG tablet Take 1 tablet (150 mg total) by mouth once.  Repeat if needed 2 tablet 0   No current facility-administered medications for this visit.       Objective:    Triage Vitals: BP 120/82 mmHg  Pulse 75  Temp(Src) 98.3 F (36.8 C) (Oral)  Resp 12  Ht 5' (1.524 m)  Wt 190 lb 2 oz (86.24 kg)  BMI 37.13 kg/m2  SpO2 98%  LMP 08/11/2014 (Approximate) Physical Exam  Constitutional: She is oriented to person, place, and time. She appears well-developed and well-nourished. No distress.  HENT:  Head: Normocephalic and atraumatic.  Eyes: EOM are normal.  Neck: Neck supple. No tracheal deviation present.  Cardiovascular: Normal rate, regular rhythm, normal heart sounds and intact distal pulses.  Exam reveals no gallop and no friction rub.   No murmur heard. Pulmonary/Chest: Effort normal and breath sounds normal. No respiratory distress. She has no wheezes. She has no rales.  Abdominal: Soft. She exhibits no distension and no mass. There is no tenderness. There is no rebound and no guarding.  Genitourinary: Uterus normal. There is no rash, tenderness or lesion on the right labia. There is no rash, tenderness or lesion on the left labia. Cervix exhibits no motion tenderness, no discharge and no friability. Right adnexum displays no mass, no tenderness and no fullness. Left adnexum displays no mass, no tenderness and no fullness. There is erythema in the vagina. No bleeding in the vagina. No foreign body around the vagina. Vaginal discharge found.  White yellow stingy discharge in vaginal. Mild erythema in the labia. No cervical motion tenderness.   Musculoskeletal: Normal range of motion.  Neurological: She is alert and oriented to person, place, and time.  Skin: Skin is warm and dry. She is not diaphoretic.  Psychiatric: She has a normal mood and affect. Her behavior is normal.  Nursing note and vitals reviewed.  ROCEPHIN 1 GRAM IM ADMINISTERED.     Assessment & Plan:   1. Vaginal discharge   2. High risk sexual behavior     -New. -Treated empirically with Rocephin 500mg  IM. -Rx for Doxycycline provided due to erythromycin allergy. -Rx for Diflucan provided. -Obtained full STD screening. -No evidence of BV.  Meds ordered this encounter  Medications  . doxycycline (VIBRAMYCIN) 100 MG capsule    Sig: Take 1 capsule (100 mg total) by mouth 2 (two) times daily.    Dispense:  20 capsule    Refill:  0  . fluconazole (DIFLUCAN) 150 MG tablet    Sig: Take 1 tablet (150 mg total) by mouth once. Repeat if needed    Dispense:  2 tablet    Refill:  0  . cefTRIAXone (ROCEPHIN) injection 500 mg    Sig:     Order Specific Question:  Antibiotic Indication:    Answer:  Other Indication (list below)    Order Specific  Question:  Other Indication:    Answer:  vaginal discharge    No Follow-up on file.    I personally performed the services described in this documentation, which was scribed in my presence. The recorded information has been reviewed and considered.  Enslee Bibbins Paulita Fujita, M.D. [facility] [address] [place], [place]  [address] [phone] phone [phone] fax

## 2014-08-26 LAB — HIV ANTIBODY (ROUTINE TESTING W REFLEX): HIV 1&2 Ab, 4th Generation: NONREACTIVE

## 2014-08-26 LAB — RPR

## 2014-08-26 LAB — HEPATITIS C ANTIBODY: HCV Ab: NEGATIVE

## 2014-08-29 LAB — HSV(HERPES SIMPLEX VRS) I + II AB-IGG
HSV 1 GLYCOPROTEIN G AB, IGG: 3.71 IV — AB
HSV 2 GLYCOPROTEIN G AB, IGG: 0.71 IV

## 2014-08-29 LAB — GC/CHLAMYDIA PROBE AMP
CT PROBE, AMP APTIMA: NEGATIVE
GC PROBE AMP APTIMA: NEGATIVE

## 2014-09-13 ENCOUNTER — Telehealth: Payer: Self-pay

## 2014-09-13 NOTE — Telephone Encounter (Signed)
Pt LM on lab VM about labs. Looks like they were sent to MyChart on 3/15. Left message on machine to call back

## 2014-09-13 NOTE — Telephone Encounter (Signed)
Pt.notified

## 2014-12-03 ENCOUNTER — Ambulatory Visit (INDEPENDENT_AMBULATORY_CARE_PROVIDER_SITE_OTHER): Payer: 59 | Admitting: Family Medicine

## 2014-12-03 VITALS — BP 150/90 | HR 101 | Temp 98.9°F | Resp 16 | Ht 60.0 in | Wt 195.0 lb

## 2014-12-03 DIAGNOSIS — N200 Calculus of kidney: Secondary | ICD-10-CM

## 2014-12-03 DIAGNOSIS — R350 Frequency of micturition: Secondary | ICD-10-CM

## 2014-12-03 DIAGNOSIS — N898 Other specified noninflammatory disorders of vagina: Secondary | ICD-10-CM

## 2014-12-03 DIAGNOSIS — R319 Hematuria, unspecified: Secondary | ICD-10-CM | POA: Diagnosis not present

## 2014-12-03 LAB — POCT WET PREP WITH KOH
Clue Cells Wet Prep HPF POC: NEGATIVE
KOH Prep POC: NEGATIVE
TRICHOMONAS UA: NEGATIVE
Yeast Wet Prep HPF POC: NEGATIVE

## 2014-12-03 LAB — POCT UA - MICROSCOPIC ONLY
CASTS, UR, LPF, POC: NEGATIVE
CRYSTALS, UR, HPF, POC: NEGATIVE
Mucus, UA: NEGATIVE
Yeast, UA: NEGATIVE

## 2014-12-03 LAB — POCT URINALYSIS DIPSTICK
Bilirubin, UA: NEGATIVE
LEUKOCYTES UA: NEGATIVE
NITRITE UA: NEGATIVE
Protein, UA: NEGATIVE
Spec Grav, UA: 1.025
Urobilinogen, UA: 0.2
pH, UA: 6.5

## 2014-12-03 MED ORDER — METRONIDAZOLE 500 MG PO TABS: 500.0000 mg | ORAL_TABLET | Freq: Two times a day (BID) | ORAL | Status: DC

## 2014-12-03 NOTE — Patient Instructions (Signed)
Bacterial Vaginosis Bacterial vaginosis is a vaginal infection that occurs when the normal balance of bacteria in the vagina is disrupted. It results from an overgrowth of certain bacteria. This is the most common vaginal infection in women of childbearing age. Treatment is important to prevent complications, especially in pregnant women, as it can cause a premature delivery. CAUSES  Bacterial vaginosis is caused by an increase in harmful bacteria that are normally present in smaller amounts in the vagina. Several different kinds of bacteria can cause bacterial vaginosis. However, the reason that the condition develops is not fully understood. RISK FACTORS Certain activities or behaviors can put you at an increased risk of developing bacterial vaginosis, including:  Having a new sex partner or multiple sex partners.  Douching.  Using an intrauterine device (IUD) for contraception. Women do not get bacterial vaginosis from toilet seats, bedding, swimming pools, or contact with objects around them. SIGNS AND SYMPTOMS  Some women with bacterial vaginosis have no signs or symptoms. Common symptoms include:  Grey vaginal discharge.  A fishlike odor with discharge, especially after sexual intercourse.  Itching or burning of the vagina and vulva.  Burning or pain with urination. DIAGNOSIS  Your health care provider will take a medical history and examine the vagina for signs of bacterial vaginosis. A sample of vaginal fluid may be taken. Your health care provider will look at this sample under a microscope to check for bacteria and abnormal cells. A vaginal pH test may also be done.  TREATMENT  Bacterial vaginosis may be treated with antibiotic medicines. These may be given in the form of a pill or a vaginal cream. A second round of antibiotics may be prescribed if the condition comes back after treatment.  HOME CARE INSTRUCTIONS   Only take over-the-counter or prescription medicines as  directed by your health care provider.  If antibiotic medicine was prescribed, take it as directed. Make sure you finish it even if you start to feel better.  Do not have sex until treatment is completed.  Tell all sexual partners that you have a vaginal infection. They should see their health care provider and be treated if they have problems, such as a mild rash or itching.  Practice safe sex by using condoms and only having one sex partner. SEEK MEDICAL CARE IF:   Your symptoms are not improving after 3 days of treatment.  You have increased discharge or pain.  You have a fever. MAKE SURE YOU:   Understand these instructions.  Will watch your condition.  Will get help right away if you are not doing well or get worse. FOR MORE INFORMATION  Centers for Disease Control and Prevention, Division of STD Prevention: www.cdc.gov/std American Sexual Health Association (ASHA): www.ashastd.org  Document Released: 06/03/2005 Document Revised: 03/24/2013 Document Reviewed: 01/13/2013 ExitCare Patient Information 2015 [employer]. This information is not intended to replace advice given to you by your health care provider. Make sure you discuss any questions you have with your health care provider.  

## 2014-12-03 NOTE — Progress Notes (Signed)
Subjective:    Patient ID: Helen Greene, female    DOB: [birth year], 44 y.o.   MRN: [id number]  12/03/2014  Vaginal Discharge   HPI This 44 y.o. female presents for evaluation of vaginal discharge.  Had painful intercourse a week ago; bled during intercourse; first episode of intercourse in a while.  The following day, horrible odor.  GYN is at [facility]; worried that OCP is cause of recurrent odors/BV infections.  +vaginal discharge this week.  No fever/chils/sweats.  +vaginal irritation.  No abdominal pain, pelvic pain.  No dysuria, urgency, hematuria.  New partner.  Just finished menses but was completely done.  Has essure tubal ligation.  Dysmenorrhea since Essure.  R ovarian pain with intercourse last night.      Review of Systems  Constitutional: Negative for fever, chills, diaphoresis and fatigue.  Gastrointestinal: Negative for nausea, vomiting, abdominal pain, diarrhea and constipation.  Genitourinary: Positive for vaginal bleeding, vaginal discharge and vaginal pain. Negative for dysuria, urgency, frequency, hematuria, flank pain, genital sores and pelvic pain.  Skin: Negative for rash and wound.    Past Medical History  Diagnosis Date  . Elevated white blood cell count   . Anxiety   . GERD (gastroesophageal reflux disease)   . Elevated white blood cell count 09/25/2011  . Kidney stone   . Allergy   . Depression    Past Surgical History  Procedure Laterality Date  . Cholecystectomy    . Ectopic pregnancy surgery    . Wisdom tooth extraction    . Dilation and curettage of uterus  1993  . Essure tubal ligation     Allergies  Allergen Reactions  . Bee Venom Hives and Swelling  . Enablex [Darifenacin Hydrobromide] Other (See Comments)    Phlebitis.  . Erythromycin Hives  . Nitrofuran Derivatives Nausea And Vomiting   History   Social History  . Marital Status: Divorced    Spouse Name: N/A  . Number of Children: N/A  . Years of Education: N/A   Occupational  History  . Not on file.   Social History Main Topics  . Smoking status: Never Smoker   . Smokeless tobacco: Never Used  . Alcohol Use: No  . Drug Use: No  . Sexual Activity: Yes   Other Topics Concern  . Not on file   Social History Narrative        Objective:    BP 150/90 mmHg  Pulse 101  Temp(Src) 98.9 F (37.2 C) (Oral)  Resp 16  Ht 5' (1.524 m)  Wt 195 lb (88.451 kg)  BMI 38.08 kg/m2  SpO2 97%  LMP 11/29/2014 Physical Exam  Constitutional: She is oriented to person, place, and time. She appears well-developed and well-nourished. No distress.  HENT:  Head: Normocephalic and atraumatic.  Eyes: Conjunctivae are normal. Pupils are equal, round, and reactive to light.  Neck: Normal range of motion. Neck supple.  Cardiovascular: Normal rate, regular rhythm and normal heart sounds.  Exam reveals no gallop and no friction rub.   No murmur heard. Pulmonary/Chest: Effort normal and breath sounds normal. She has no wheezes. She has no rales.  Abdominal: Soft. Bowel sounds are normal. She exhibits no distension and no mass. There is no tenderness. There is no rebound and no guarding.  Genitourinary: Uterus normal. There is no rash, tenderness, lesion or injury on the right labia. There is no rash, tenderness, lesion or injury on the left labia. Uterus is not tender. Cervix exhibits no motion tenderness,  no discharge and no friability. Right adnexum displays tenderness. Right adnexum displays no mass and no fullness. Left adnexum displays no mass, no tenderness and no fullness. There is erythema in the vagina. No tenderness or bleeding in the vagina. No foreign body around the vagina. Vaginal discharge found.  Neurological: She is alert and oriented to person, place, and time.  Skin: She is not diaphoretic.  Psychiatric: She has a normal mood and affect. Her behavior is normal.  Nursing note and vitals reviewed.  Results for orders placed or performed in visit on 12/03/14  POCT  urinalysis dipstick  Result Value Ref Range   Color, UA yellow    Clarity, UA clear    Glucose, UA trace    Bilirubin, UA neg    Ketones, UA trace    Spec Grav, UA 1.025    Blood, UA large    pH, UA 6.5    Protein, UA neg    Urobilinogen, UA 0.2    Nitrite, UA neg    Leukocytes, UA Negative Negative  POCT UA - Microscopic Only  Result Value Ref Range   WBC, Ur, HPF, POC 2-4    RBC, urine, microscopic TNTC    Bacteria, U Microscopic trace    Mucus, UA neg    Epithelial cells, urine per micros 1-3    Crystals, Ur, HPF, POC neg    Casts, Ur, LPF, POC neg    Yeast, UA neg   POCT Wet Prep with KOH  Result Value Ref Range   Trichomonas, UA Negative    Clue Cells Wet Prep HPF POC neg    Epithelial Wet Prep HPF POC Few Few, Moderate, Many   Yeast Wet Prep HPF POC neg    Bacteria Wet Prep HPF POC Few Few   RBC Wet Prep HPF POC 0-2    WBC Wet Prep HPF POC 3-4    KOH Prep POC Negative        Assessment & Plan:   1. Vaginal irritation   2. Urinary frequency   3. Hematuria   4. Nephrolithiasis    1. Vaginal irritation and odor: New.  Onset with recent intercourse; treat with Metronidazole bid; send GC/Chlam.   2.  Hematuria: New; no gross hematuria; pt with known kidney stone; followed by urology; recommend follow-up with urology regarding hematuria; asymptomatic. 3.  Nephrolithiasis: stable; denies colicky pain or gross hematuria; followed by urology.   Meds ordered this encounter  Medications  . metroNIDAZOLE (FLAGYL) 500 MG tablet    Sig: Take 1 tablet (500 mg total) by mouth 2 (two) times daily.    Dispense:  14 tablet    Refill:  0    No Follow-up on file.    Kristi Paulita Fujita, M.D. [facility] [address] [place], [place]  [address] [phone] phone [phone] fax

## 2014-12-06 ENCOUNTER — Telehealth: Payer: Self-pay

## 2014-12-06 NOTE — Telephone Encounter (Signed)
Pt called about her labs. Let her know everything is still pending.

## 2014-12-07 ENCOUNTER — Telehealth: Payer: Self-pay

## 2014-12-07 DIAGNOSIS — Z1621 Resistance to vancomycin: Principal | ICD-10-CM

## 2014-12-07 DIAGNOSIS — A491 Streptococcal infection, unspecified site: Secondary | ICD-10-CM

## 2014-12-07 LAB — GC/CHLAMYDIA PROBE AMP
CT PROBE, AMP APTIMA: NEGATIVE
GC Probe RNA: NEGATIVE

## 2014-12-07 NOTE — Telephone Encounter (Signed)
Pt was seen here on 6/18 by Dr. Katrinka Blazing and was given metroNIDAZOLE (FLAGYL) 500 MG tablet [[id number]]. She feels this is not working. She is having burning pains. She would like to know what to do. Please advise at [phone]

## 2014-12-07 NOTE — Telephone Encounter (Signed)
Call --- has the odor resolved?  Is the burning pain worsening from her visit?

## 2014-12-08 LAB — URINE CULTURE: Colony Count: 30000

## 2014-12-08 MED ORDER — FOSFOMYCIN TROMETHAMINE 3 G PO PACK: PACK | ORAL | Status: DC

## 2014-12-08 NOTE — Telephone Encounter (Signed)
Spoke with pt about results.  She states referrals should call her on her work number to make appt with urology. She is going to call back and give [place] the correct work number.

## 2014-12-08 NOTE — Telephone Encounter (Signed)
Spoke with urologist on call, Dr. Ronne Binning. He advised putting pt on fosfomycin 3 gm q 72 hours x 3 doses and having her follow up with him in the office. Abx sent to pharmacy and urgent referral made. Called and left VM on machine. Call back with questions.

## 2014-12-08 NOTE — Telephone Encounter (Signed)
Called pt, left message to call back.

## 2014-12-15 ENCOUNTER — Other Ambulatory Visit: Payer: Self-pay | Admitting: Urology

## 2014-12-16 ENCOUNTER — Encounter (HOSPITAL_COMMUNITY): Payer: Self-pay | Admitting: *Deleted

## 2014-12-18 ENCOUNTER — Telehealth: Payer: Self-pay | Admitting: Family Medicine

## 2014-12-18 NOTE — Telephone Encounter (Signed)
Patient states that her antibiotic gave her a yeast infection. Can she have a medication sent in to clear this up? [facility] [place].  [phone]

## 2014-12-19 MED ORDER — FLUCONAZOLE 150 MG PO TABS: 150.0000 mg | ORAL_TABLET | Freq: Once | ORAL | Status: DC

## 2014-12-19 NOTE — Telephone Encounter (Signed)
Sent in Diflucan to pharmacy.  Please advise pt.

## 2014-12-19 NOTE — Telephone Encounter (Signed)
Pt called again. Please see previous message. °

## 2014-12-19 NOTE — Telephone Encounter (Signed)
Please advise. Pt was treated for BV now states yeast infection from BV treatment ok to send in Diflucan or should patient RTC ?

## 2014-12-20 NOTE — Telephone Encounter (Signed)
Left message on machine informing pt that Diflucan was sent in.

## 2014-12-22 ENCOUNTER — Ambulatory Visit (HOSPITAL_COMMUNITY)
Admission: RE | Admit: 2014-12-22 | Discharge: 2014-12-22 | Disposition: A | Discharge status: Home / Self Care | Payer: 59 | Source: Ambulatory Visit | Attending: Urology | Admitting: Urology

## 2014-12-22 ENCOUNTER — Ambulatory Visit (HOSPITAL_COMMUNITY): Payer: 59

## 2014-12-22 ENCOUNTER — Encounter (HOSPITAL_COMMUNITY)
Admission: RE | Disposition: A | Discharge status: Home / Self Care | Payer: Self-pay | Source: Ambulatory Visit | Attending: Urology

## 2014-12-22 ENCOUNTER — Encounter (HOSPITAL_COMMUNITY): Payer: Self-pay

## 2014-12-22 DIAGNOSIS — N200 Calculus of kidney: Secondary | ICD-10-CM | POA: Insufficient documentation

## 2014-12-22 DIAGNOSIS — Z79899 Other long term (current) drug therapy: Secondary | ICD-10-CM | POA: Diagnosis not present

## 2014-12-22 DIAGNOSIS — E669 Obesity, unspecified: Secondary | ICD-10-CM | POA: Diagnosis not present

## 2014-12-22 DIAGNOSIS — K219 Gastro-esophageal reflux disease without esophagitis: Secondary | ICD-10-CM | POA: Diagnosis not present

## 2014-12-22 DIAGNOSIS — Z6837 Body mass index (BMI) 37.0-37.9, adult: Secondary | ICD-10-CM | POA: Insufficient documentation

## 2014-12-22 DIAGNOSIS — Z791 Long term (current) use of non-steroidal anti-inflammatories (NSAID): Secondary | ICD-10-CM | POA: Insufficient documentation

## 2014-12-22 DIAGNOSIS — F419 Anxiety disorder, unspecified: Secondary | ICD-10-CM | POA: Diagnosis not present

## 2014-12-22 DIAGNOSIS — Z7722 Contact with and (suspected) exposure to environmental tobacco smoke (acute) (chronic): Secondary | ICD-10-CM | POA: Insufficient documentation

## 2014-12-22 DIAGNOSIS — I10 Essential (primary) hypertension: Secondary | ICD-10-CM | POA: Diagnosis not present

## 2014-12-22 DIAGNOSIS — R109 Unspecified abdominal pain: Secondary | ICD-10-CM | POA: Diagnosis present

## 2014-12-22 DIAGNOSIS — F329 Major depressive disorder, single episode, unspecified: Secondary | ICD-10-CM | POA: Diagnosis not present

## 2014-12-22 HISTORY — DX: Sleep apnea, unspecified: G47.30

## 2014-12-22 HISTORY — PX: LITHOTRIPSY: SUR834

## 2014-12-22 HISTORY — DX: Reserved for inherently not codable concepts without codable children: IMO0001

## 2014-12-22 HISTORY — DX: Myoneural disorder, unspecified: G70.9

## 2014-12-22 LAB — PREGNANCY, URINE: Preg Test, Ur: NEGATIVE

## 2014-12-22 SURGERY — LITHOTRIPSY, ESWL
Anesthesia: LOCAL | Laterality: Left

## 2014-12-22 MED ORDER — DIPHENHYDRAMINE HCL 25 MG PO CAPS
25.0000 mg | ORAL_CAPSULE | ORAL | Status: AC
  Administered 2014-12-22: 25 mg via ORAL
  Filled 2014-12-22: qty 1

## 2014-12-22 MED ORDER — TAMSULOSIN HCL 0.4 MG PO CAPS: 0.4000 mg | ORAL_CAPSULE | ORAL | Status: DC

## 2014-12-22 MED ORDER — OXYCODONE HCL 10 MG PO TABS: 10.0000 mg | ORAL_TABLET | ORAL | Status: DC | PRN

## 2014-12-22 MED ORDER — DIAZEPAM 5 MG PO TABS
10.0000 mg | ORAL_TABLET | ORAL | Status: AC
  Administered 2014-12-22: 10 mg via ORAL
  Filled 2014-12-22: qty 2

## 2014-12-22 MED ORDER — TAMSULOSIN HCL 0.4 MG PO CAPS
0.4000 mg | ORAL_CAPSULE | Freq: Once | ORAL | Status: AC
  Administered 2014-12-22: 0.4 mg via ORAL
  Filled 2014-12-22 (×2): qty 1

## 2014-12-22 MED ORDER — CIPROFLOXACIN HCL 500 MG PO TABS
500.0000 mg | ORAL_TABLET | ORAL | Status: AC
  Administered 2014-12-22: 500 mg via ORAL
  Filled 2014-12-22: qty 1

## 2014-12-22 MED ORDER — SODIUM CHLORIDE 0.9 % IV SOLN
INTRAVENOUS | Status: DC
  Administered 2014-12-22: 08:00:00 via INTRAVENOUS

## 2014-12-22 NOTE — H&P (Signed)
History of Present Illness Helen Greene is here for followup for UTI and left flank pain. The flank pain started 2 weeks ago and in the last week the pain has worsened. Pain is sharp, intermittent, nonradiating. She denies new onset LUTS, hematuria. She was also treated for a complicated UTI 1 week ago. She had multi drug resistant enterococcus. She was treated for 1 week with fosfomycin. UA today shows TNTC RBCs   Past Medical History Problems  1. History of Anxiety (F41.9) 2. History of depression (Z86.59) 3. History of esophageal reflux (Z87.19) 4. History of hypertension (Z86.79)  Surgical History Problems  1. History of Cholecystectomy 2. History of Dilation And Curettage 3. History of Laparoscopy (Diagnostic) 4. History of Lithotripsy 5. History of Oral Surgery Tooth Extraction  Current Meds 1. Advil TABS;  Therapy: (Recorded:30Jun2016) to Recorded 2. BuPROPion HCl ER (XL) 150 MG Oral Tablet Extended Release 24 Hour;  Therapy: (Recorded:06Aug2013) to Recorded 3. ClonazePAM 1 MG Oral Tablet;  Therapy: (Recorded:06Aug2013) to Recorded 4. LamoTRIgine 100 MG Oral Tablet;  Therapy: (Recorded:06Aug2013) to Recorded 5. Lexapro TABS;  Therapy: (Recorded:02Nov2015) to Recorded  Allergies Medication  1. Erythromycin TABS 2. Macrobid CAPS 3. Pyridium TABS Non-Medication  4. Bee sting  Family History Problems  1. Family history of Acute Myocardial Infarction : Mother 2. Family history of Angina Pectoris : Father 3. Family history of Congestive Heart Failure : Mother 4. Family history of Death In The Family Father 5. Family history of Diabetes Mellitus 6. Family history of Family Health Status Children ___ Living Sons   2 7. Family history of Heart Disease : Mother 8. Family history of Heart Disease : Father 74. Family history of Heart Disease : Brother  Social History Problems  1. Alcohol Use   occasional 2. Caffeine Use   occasional soda approximately 1-2 times a  week 3. Never A Smoker 4. Secondhand Tobacco Smoke In Home   husband smokes 5. Denied: History of Tobacco Use  Review of Systems Constitutional, skin, eye, otolaryngeal, hematologic/lymphatic, cardiovascular, pulmonary, endocrine, musculoskeletal, gastrointestinal, neurological and psychiatric system(s) were reviewed and pertinent findings if present are noted and are otherwise negative.  Genitourinary: hematuria, but no urinary frequency, no urinary urgency, no dysuria, no nocturia, no incontinence, no urinary hesitancy, urinary stream does not start and stop and initiating urination does not require straining.    Vitals Vital Signs  Height: 5 ft  Weight: 195 lb  BMI Calculated: 38.08 BSA Calculated: 1.85 Blood Pressure: 146 / 90 Temperature: 98.9 F Heart Rate: 93  Physical Exam Constitutional: Well nourished and well developed . No acute distress. The patient appears well hydrated.  ENT:. The ears and nose are normal in appearance. The oropharynx is normal.  Neck: The appearance of the neck is normal and no neck mass is present.  Pulmonary: No respiratory distress . No accessory muscle use.  Cardiovascular:. The arterial pulses are normal. No peripheral edema.  Abdomen: The abdomen is mildly obese. The abdomen is soft and nontender. No CVA tenderness. No hernias are palpable.  Lymphatics: The femoral and inguinal nodes are not enlarged or tender.  Skin: Normal skin turgor, no visible rash and normal skin color and pigmentation.  Neuro/Psych:. Mood and affect are appropriate. No focal sensory deficits.    Results/Data   Study: Renal Ultrasound    Indication: left flank pain    Rt Kidney: The right kidney is visualized in size measuring 12.5 cm height x 4.87 cm    anterior-posterior and 5.71 cm  medial-lateral. cortical-medullary differentiation is    visualized with average cortex thickness of 1.24 cm. no hypoechoic lesions    identified. no hyperechoic lesions  identified. no renal pelvic dilatation    (hydronehprosis) is visualized. no proximal ureteral dilation is visualized.    Lt Kidney: The left kidney is visualized in size measuring 12.04 cm height x 4.76 cm    anterior-posterior and 4.83 cm medial-lateral. cortical-medullary differentiation is    visualized with average cortex thickness of 1.79cm. no hypoechoic lesions    identified. 0.8cm and 0.45cm hyperechoic lesions identified in lower pole. norenal pelvic dilatation    (hydronehprosis) is visualized. no proximal ureteral dilation is visualized.    IMPRESSION: left lower pole renal calculi, no hydronephrosis    Helen Greene    Study: Abdominal Anterior-Posterior Radiograph    Indication: left flank pain    Bony Structures: Visualized bony structures of the ribs, spine, and pelvis demonstrate no    large lytic or blastic lesions.    Bowel Gas: Visualized bowel gas is non-obstructive appearing.    Rt Kidney: The right kidney is shadow is visualized. no radiodense structures    are identified in the expected location of the kidney and renal pelvis. no radiodense    structures are identified in the expected location of the ureter.    Lt Kidney: The left kidney is shadow is visualized. 2 radiodense structures are    identified in the expected location of the lower pole. no radiodense    structures are identified in the expected location of the ureter.    Bladder Area / Pelvis: no radiodense structures are identified in the expected location    of the urinary bladder. no densities consistent with phleboliths are visualized.    IMPRESSION: left lower pole renal calculi    Helen Bang MD    Assessment Assessed  1. Bilateral kidney stones (N20.0) 2. Microscopic hematuria (R31.2)  Discussion/Summary We discussed treatment approaches to renal stones including observation, medical    expulsive therapy (MET),  ureteroscopy (URS), shockwave lithotripsy (SWL), and    percutaneous nephrostolithotomy (PCNL) with the later preferred for cases of larger    stones or in cases of unique / complex anatomy. We discussed the relative merits of the    techniques including risks, benefits, and predicted efficacy with PCNL, URS, and SWL    being presented in order of decreasing efficacy, but also decreasing risk in most patients.    After careful consideration, the patient has elected to undergo ESWL.    We discussed shockwave lithotripsy in detail as well as my "rule of 9s" with stones    &lt;90m, less than 900 HU, and skin to stone distance &lt;9cm having approximately 90%    treatment success with single session of treatment. We then addressed how stones that are    larger, more dense, and in patients with less favorable anatomy have incrementally    decreased success rates. We discussed risks including, bleeding, infection, hematoma,    loss of kidney, need for staged therapy, need for adjunctive therapy and requirement to    refrain from any anticoagulants, anti-platelet or aspirin-like products peri-procedureally.    After careful consideration, the patient has chosen to proceed.

## 2014-12-22 NOTE — Discharge Instructions (Signed)

## 2014-12-22 NOTE — Op Note (Signed)
See [facility] OP note scanned into chart. Also because of the size, density, location and other factors that cannot be anticipated I feel this will likely be a staged procedure. This fact supersedes any indication in the scanned [facility] operative note to the contrary.  

## 2015-02-06 ENCOUNTER — Ambulatory Visit (INDEPENDENT_AMBULATORY_CARE_PROVIDER_SITE_OTHER): Payer: 59 | Admitting: Family Medicine

## 2015-02-06 VITALS — BP 122/80 | HR 88 | Temp 98.8°F | Resp 16 | Ht 60.0 in | Wt 196.4 lb

## 2015-02-06 DIAGNOSIS — R030 Elevated blood-pressure reading, without diagnosis of hypertension: Secondary | ICD-10-CM

## 2015-02-06 DIAGNOSIS — IMO0001 Reserved for inherently not codable concepts without codable children: Secondary | ICD-10-CM

## 2015-02-06 DIAGNOSIS — J3489 Other specified disorders of nose and nasal sinuses: Secondary | ICD-10-CM | POA: Diagnosis not present

## 2015-02-06 MED ORDER — AMOXICILLIN 500 MG PO CAPS: 1000.0000 mg | ORAL_CAPSULE | Freq: Two times a day (BID) | ORAL | Status: DC

## 2015-02-06 MED ORDER — HYDROCHLOROTHIAZIDE 12.5 MG PO TABS: 12.5000 mg | ORAL_TABLET | Freq: Every day | ORAL | Status: DC

## 2015-02-06 NOTE — Progress Notes (Signed)
[facility] [address], [place] [address] [phone]  Date:  02/06/2015   Name:  Helen Greene   DOB:  Nov 10, 1970   MRN:  [id number]  PCP:  Allean Found, MD    Chief Complaint: Headache and Hypertension   History of Present Illness:  Helen Greene is a 44 y.o. very pleasant female patient who presents with the following:  Here today with concern about possibly elevated BP Dr. Nolen Mu is treating her for anxiety with klonopin.   She "does not feel right in the head," for the last few weeks she has noted a "funkified feeling" in her head and thought it might be due to her BP. She feels like her BP has been high a few times as of late She feels like the top of her head is "tender."   She has had a history of HTN in the past, but she lost weight and was able to stop her medication.   She was treated with hctz and then added lisinopril in the past She stopped BP medication a couple of years ago.    BP Readings from Last 3 Encounters:  02/06/15 122/80  12/22/14 142/86  12/03/14 150/90   A couple of weeks ago she was exposed to a very strong smelling lotion.  Pt felt like "I was tingly, my nerves were tingly and I got a migraine."  She had to move work stations at her job because of the smell.  This is when the "funny feeling" started.  "I wish I could explain it better.  I feel like when my BP was high before." She does not note sinus congestion, but she does have some PND and crackling in her ears.    She has had a little nausea with the HA.  No vomiting  She has a history of migraine, amerge is on her medication list but has not used it in a long time.   This seems "almost" like a migraine to her.   Admits that she takes ibuprofen pretty regularly recently for HA She is s/p BTL  Patient Active Problem List   Diagnosis Date Noted  . HTN (hypertension) 05/19/2013  . Elevated white blood cell count 09/25/2011    Past Medical History   Diagnosis Date  . Elevated white blood cell count   . Anxiety   . GERD (gastroesophageal reflux disease)   . Elevated white blood cell count 09/25/2011  . Kidney stone   . Allergy   . Depression   . Complication of anesthesia   . PONV (postoperative nausea and vomiting)   . Sleep apnea     does not have CPAP machine  . Shortness of breath dyspnea     with exertion  . Neuromuscular disorder     carpel tunnel disease    Past Surgical History  Procedure Laterality Date  . Cholecystectomy    . Ectopic pregnancy surgery    . Wisdom tooth extraction    . Dilation and curettage of uterus  1993  . Essure tubal ligation      Social History  Substance Use Topics  . Smoking status: Never Smoker   . Smokeless tobacco: Never Used  . Alcohol Use: No    Family History  Problem Relation Age of Onset  . Hypertension Mother   . Heart failure Mother   . Cancer Mother   . Heart disease Mother   . Heart disease Brother   . Hypertension  Brother   . Cancer Father     Leukemia  . Hypertension Father   . Cancer Sister     Allergies  Allergen Reactions  . Bee Venom Hives and Swelling  . Enablex [Darifenacin Hydrobromide] Other (See Comments)    Phlebitis.  . Erythromycin Hives  . Macrobid [Nitrofurantoin Monohyd Macro] Nausea And Vomiting  . Nitrofuran Derivatives Nausea And Vomiting    Medication list has been reviewed and updated.  Current Outpatient Prescriptions on File Prior to Visit  Medication Sig Dispense Refill  . acetaminophen (TYLENOL) 500 MG tablet Take 500 mg by mouth every 6 (six) hours as needed for mild pain.    Marland Kitchen buPROPion (WELLBUTRIN XL) 150 MG 24 hr tablet Take 150 mg by mouth every morning.     . clonazePAM (KLONOPIN) 1 MG tablet Take 0.5-1 mg by mouth at bedtime as needed for anxiety.     Marland Kitchen escitalopram (LEXAPRO) 10 MG tablet Take 10 mg by mouth at bedtime.     . fluconazole (DIFLUCAN) 150 MG tablet Take 1 tablet (150 mg total) by mouth once. Repeat if  needed 2 tablet 0  . hydrOXYzine (ATARAX/VISTARIL) 25 MG tablet Take 25-50 mg by mouth 3 (three) times daily as needed for anxiety.     Marland Kitchen ibuprofen (ADVIL,MOTRIN) 200 MG tablet Take 200-800 mg by mouth every 6 (six) hours as needed for headache.     . lamoTRIgine (LAMICTAL) 200 MG tablet Take 200 mg by mouth at bedtime.     . Omeprazole (PRILOSEC PO) Take 20 mg by mouth 2 (two) times daily.     . ondansetron (ZOFRAN) 4 MG tablet Take 1 tablet (4 mg total) by mouth every 6 (six) hours. 20 tablet 0  . Oxycodone HCl 10 MG TABS Take 1 tablet (10 mg total) by mouth every 4 (four) hours as needed. 30 tablet 0  . oxyCODONE-acetaminophen (PERCOCET/ROXICET) 5-325 MG per tablet Take 1-2 tablets by mouth once. (Patient taking differently: Take 1-2 tablets by mouth every 4 (four) hours as needed for moderate pain. ) 20 tablet 0  . promethazine (PHENERGAN) 25 MG tablet Take 25 mg by mouth every 6 (six) hours as needed for nausea or vomiting.    . tamsulosin (FLOMAX) 0.4 MG CAPS capsule Take 1 capsule (0.4 mg total) by mouth daily after supper. 30 capsule 0  . valACYclovir (VALTREX) 500 MG tablet Take 500 mg by mouth 2 (two) times daily as needed (for cold sores).     . fosfomycin (MONUROL) 3 G PACK Take 3 gram po mixed with water every 72 hours x 3 doses (Patient not taking: Reported on 02/06/2015) 9 g 0  . metroNIDAZOLE (FLAGYL) 500 MG tablet Take 1 tablet (500 mg total) by mouth 2 (two) times daily. (Patient not taking: Reported on 02/06/2015) 14 tablet 0  . naproxen sodium (ANAPROX) 220 MG tablet Take 220 mg by mouth daily as needed (Mild pain).    . naratriptan (AMERGE) 2.5 MG tablet Take 2.5 mg by mouth as needed for migraine. Take one (1) tablet at onset of headache; if returns or does not resolve, may repeat after 4 hours; do not exceed five (5) mg in 24 hours.     No current facility-administered medications on file prior to visit.    Review of Systems:  As per HPI- otherwise negative.   Physical  Examination: Filed Vitals:   02/06/15 1315  BP: 122/80  Pulse: 88  Temp: 98.8 F (37.1 C)  Resp: 16  Filed Vitals:   02/06/15 1315  Height: 5' (1.524 m)  Weight: 196 lb 6.4 oz (89.086 kg)   Body mass index is 38.36 kg/(m^2). Ideal Body Weight: Weight in (lb) to have BMI = 25: 127.7  GEN: WDWN, NAD, Non-toxic, A & O x 3, obese, looks well HEENT: Atraumatic, Normocephalic. Neck supple. No masses, No LAD.  Bilateral TM wnl, oropharynx normal.  PEERL,EOMI.  Nasal cavity congested  Ears and Nose: No external deformity. CV: RRR, No M/G/R. No JVD. No thrill. No extra heart sounds. PULM: CTA B, no wheezes, crackles, rhonchi. No retractions. No resp. distress. No accessory muscle use. EXTR: No c/c/e NEURO Normal gait. Normal strength, sensation and DTR of all extremities, normal sensation and motion of face, negative romberg.  PSYCH: Normally interactive. Conversant. Not depressed or anxious appearing.  Calm demeanor.    Assessment and Plan: Elevated BP - Plan: hydrochlorothiazide (HYDRODIURIL) 12.5 MG tablet  Sinus pressure - Plan: amoxicillin (AMOXIL) 500 MG capsule  BP is ok today, but as she is worried about HTN, has had some recent high readings and also notes some swelling of her hands at times it is reasonable to try HCTZ She will start with 12.5 mg as below Amoxicillin for persistent sinus pressure She will let us know if not feeling better soon Asked her to follow-up with [place] in about a month in any case  Meds ordered this encounter  Medications  . amoxicillin (AMOXIL) 500 MG capsule    Sig: Take 2 capsules (1,000 mg total) by mouth 2 (two) times daily.    Dispense:  40 capsule    Refill:  0  . hydrochlorothiazide (HYDRODIURIL) 12.5 MG tablet    Sig: Take 1 tablet (12.5 mg total) by mouth daily.    Dispense:  90 tablet    Refill:  3    Signed Abbe Amsterdam, MD

## 2015-02-06 NOTE — Patient Instructions (Signed)
Try taking HCTZ 1/2 or whole tablet daily for blood pressure Also complete the course of amoxicillin for your sinuses Let me know if you do not feel better soon!  Sooner if any change or worsening of your symptoms

## 2015-02-08 ENCOUNTER — Telehealth: Payer: Self-pay | Admitting: *Deleted

## 2015-02-08 ENCOUNTER — Ambulatory Visit (INDEPENDENT_AMBULATORY_CARE_PROVIDER_SITE_OTHER): Payer: 59 | Admitting: Emergency Medicine

## 2015-02-08 VITALS — BP 117/81 | HR 98 | Temp 98.4°F | Resp 16 | Ht 60.0 in | Wt 196.0 lb

## 2015-02-08 DIAGNOSIS — T189XXA Foreign body of alimentary tract, part unspecified, initial encounter: Secondary | ICD-10-CM

## 2015-02-08 DIAGNOSIS — K140 Glossitis: Secondary | ICD-10-CM | POA: Diagnosis not present

## 2015-02-08 DIAGNOSIS — D72829 Elevated white blood cell count, unspecified: Secondary | ICD-10-CM | POA: Diagnosis not present

## 2015-02-08 LAB — COMPLETE METABOLIC PANEL WITH GFR
ALBUMIN: 4.4 g/dL (ref 3.6–5.1)
ALK PHOS: 88 U/L (ref 33–115)
ALT: 12 U/L (ref 6–29)
AST: 16 U/L (ref 10–30)
BUN: 14 mg/dL (ref 7–25)
CO2: 27 mmol/L (ref 20–31)
CREATININE: 0.72 mg/dL (ref 0.50–1.10)
Calcium: 9.9 mg/dL (ref 8.6–10.2)
Chloride: 100 mmol/L (ref 98–110)
GFR, Est African American: >89 mL/min (ref >=60)
GFR, Est Non African American: >89 mL/min (ref >=60)
GLUCOSE: 84 mg/dL (ref 65–99)
POTASSIUM: 4.1 mmol/L (ref 3.5–5.3)
SODIUM: 138 mmol/L (ref 135–146)
Total Bilirubin: 0.5 mg/dL (ref 0.2–1.2)
Total Protein: 7.6 g/dL (ref 6.1–8.1)

## 2015-02-08 LAB — POCT CBC
GRANULOCYTE PERCENT: 77.1 % (ref 37–80)
HCT, POC: 46.5 % (ref 37.7–47.9)
Hemoglobin: 14.6 g/dL (ref 12.2–16.2)
Lymph, poc: 3.1 (ref 0.6–3.4)
MCH, POC: 28.3 pg (ref 27–31.2)
MCHC: 31.4 g/dL — AB (ref 31.8–35.4)
MCV: 90.2 fL (ref 80–97)
MID (CBC): 0.3 (ref 0–0.9)
MPV: 7.4 fL (ref 0–99.8)
PLATELET COUNT, POC: 347 10*3/uL (ref 142–424)
POC Granulocyte: 11.3 — AB (ref 2–6.9)
POC LYMPH PERCENT: 21.1 %L (ref 10–50)
POC MID %: 1.8 %M (ref 0–12)
RBC: 5.15 M/uL (ref 4.04–5.48)
RDW, POC: 13 %
WBC: 14.7 10*3/uL — AB (ref 4.6–10.2)

## 2015-02-08 MED ORDER — FIRST-DUKES MOUTHWASH MT SUSP: OROMUCOSAL | Status: DC

## 2015-02-08 MED ORDER — RANITIDINE HCL 150 MG PO TABS
150.0000 mg | ORAL_TABLET | Freq: Once | ORAL | Status: AC
  Administered 2015-02-08: 150 mg via ORAL

## 2015-02-08 MED ORDER — RANITIDINE HCL 150 MG PO TABS: 150.0000 mg | ORAL_TABLET | Freq: Two times a day (BID) | ORAL | Status: DC

## 2015-02-08 NOTE — Progress Notes (Addendum)
Subjective:  This chart was scribed for Lesle Chris MD, by Veverly Fells, at [facility].  This patient was seen in room 1 and the patient's care was started at 11:32 AM.   Chief Complaint  Patient presents with  . Ate bad food    This AM  . Numbness    in mouth  . Nausea     Patient ID: Helen Greene, female    DOB: 09-21-1970, 44 y.o.   MRN: [id number]  HPI  HPI Comments: Helen Greene is a 44 y.o. female who presents to the [facility] complaining of numbness in her mouth/tongue after eating a hostess cake (purchased from a gas station) that tasted like it had "chemicals" on it onset 8:30 am this morning.  Patient has associated symptoms of abdominal tenderness,  nausea and swelling in her mouth (earlier) and states that she had difficulty speaking.  She states that she was scared when her symptoms started occurring and started having heavy anxiety.  Patient was here two days ago and was given antibiotics for a sinus infection.  She denies any adverse effects to the anti-biotics and does not think her symptoms occurred from the medication.  Patient works for [employer].  She has no other concerns today.    Patient purchased the donuts at a [employer]:  [place] [address] [employer]  [phone]    Patient Active Problem List   Diagnosis Date Noted  . HTN (hypertension) 05/19/2013  . Elevated white blood cell count 09/25/2011   Past Medical History  Diagnosis Date  . Elevated white blood cell count   . Anxiety   . GERD (gastroesophageal reflux disease)   . Elevated white blood cell count 09/25/2011  . Kidney stone   . Allergy   . Depression   . Complication of anesthesia   . PONV (postoperative nausea and vomiting)   . Sleep apnea     does not have CPAP machine  . Shortness of breath dyspnea     with exertion  . Neuromuscular disorder     carpel tunnel disease   Past Surgical History  Procedure Laterality  Date  . Cholecystectomy    . Ectopic pregnancy surgery    . Wisdom tooth extraction    . Dilation and curettage of uterus  1993  . Essure tubal ligation     Allergies  Allergen Reactions  . Bee Venom Hives and Swelling  . Enablex [Darifenacin Hydrobromide] Other (See Comments)    Phlebitis.  . Erythromycin Hives  . Macrobid [Nitrofurantoin Monohyd Macro] Nausea And Vomiting  . Nitrofuran Derivatives Nausea And Vomiting   Prior to Admission medications   Medication Sig Start Date End Date Taking? Authorizing Provider  acetaminophen (TYLENOL) 500 MG tablet Take 500 mg by mouth every 6 (six) hours as needed for mild pain.   Yes Historical Provider, MD  amoxicillin (AMOXIL) 500 MG capsule Take 2 capsules (1,000 mg total) by mouth 2 (two) times daily. 02/06/15  Yes Gwenlyn Found Copland, MD  buPROPion (WELLBUTRIN XL) 150 MG 24 hr tablet Take 150 mg by mouth every morning.    Yes Historical Provider, MD  clonazePAM (KLONOPIN) 1 MG tablet Take 0.5-1 mg by mouth at bedtime as needed for anxiety.    Yes Historical Provider, MD  escitalopram (LEXAPRO) 10 MG tablet Take 10 mg by mouth at bedtime.    Yes Historical Provider, MD  fluconazole (DIFLUCAN) 150 MG  tablet Take 1 tablet (150 mg total) by mouth once. Repeat if needed 12/19/14  Yes Ethelda Chick, MD  hydrochlorothiazide (HYDRODIURIL) 12.5 MG tablet Take 1 tablet (12.5 mg total) by mouth daily. 02/06/15  Yes Gwenlyn Found Copland, MD  hydrOXYzine (ATARAX/VISTARIL) 25 MG tablet Take 25-50 mg by mouth 3 (three) times daily as needed for anxiety.    Yes Historical Provider, MD  ibuprofen (ADVIL,MOTRIN) 200 MG tablet Take 200-800 mg by mouth every 6 (six) hours as needed for headache.    Yes Historical Provider, MD  lamoTRIgine (LAMICTAL) 200 MG tablet Take 200 mg by mouth at bedtime.    Yes Historical Provider, MD  naproxen sodium (ANAPROX) 220 MG tablet Take 220 mg by mouth daily as needed (Mild pain).   Yes Historical Provider, MD  naratriptan (AMERGE)  2.5 MG tablet Take 2.5 mg by mouth as needed for migraine. Take one (1) tablet at onset of headache; if returns or does not resolve, may repeat after 4 hours; do not exceed five (5) mg in 24 hours.   Yes Historical Provider, MD  Omeprazole (PRILOSEC PO) Take 20 mg by mouth 2 (two) times daily.    Yes Historical Provider, MD  ondansetron (ZOFRAN) 4 MG tablet Take 1 tablet (4 mg total) by mouth every 6 (six) hours. 04/13/14  Yes Harle Battiest, NP  Oxycodone HCl 10 MG TABS Take 1 tablet (10 mg total) by mouth every 4 (four) hours as needed. 12/22/14  Yes Ihor Gully, MD  oxyCODONE-acetaminophen (PERCOCET/ROXICET) 5-325 MG per tablet Take 1-2 tablets by mouth once. Patient taking differently: Take 1-2 tablets by mouth every 4 (four) hours as needed for moderate pain.  07/24/14  Yes Nani Ravens, MD  promethazine (PHENERGAN) 25 MG tablet Take 25 mg by mouth every 6 (six) hours as needed for nausea or vomiting.   Yes Historical Provider, MD  tamsulosin (FLOMAX) 0.4 MG CAPS capsule Take 1 capsule (0.4 mg total) by mouth daily after supper. 12/22/14  Yes Ihor Gully, MD  valACYclovir (VALTREX) 500 MG tablet Take 500 mg by mouth 2 (two) times daily as needed (for cold sores).    Yes Historical Provider, MD   Social History   Social History  . Marital Status: Divorced    Spouse Name: N/A  . Number of Children: N/A  . Years of Education: N/A   Occupational History  . Not on file.   Social History Main Topics  . Smoking status: Never Smoker   . Smokeless tobacco: Never Used  . Alcohol Use: No  . Drug Use: No  . Sexual Activity: Yes   Other Topics Concern  . Not on file   Social History Narrative         Review of Systems  Constitutional: Negative for fever and chills.  HENT: Negative for drooling, ear discharge, ear pain and sore throat.   Eyes: Negative for pain, discharge and itching.  Gastrointestinal: Positive for nausea and abdominal pain. Negative for vomiting.    Musculoskeletal: Negative for neck pain and neck stiffness.  Neurological: Positive for numbness. Negative for seizures and speech difficulty.       Objective:   Physical Exam CONSTITUTIONAL: Well developed/well nourished HEAD: Normocephalic/atraumatic EYES: EOMI/PERRL ENMT: Mucous membranes moist, Has some mild prominence of the taste buds.  LUNGS: Lungs are clear to auscultation bilaterally, no apparent distress ABDOMEN: no rebound or guarding, bowel sounds noted throughout abdomen GU:no cva tenderness NEURO: Pt is awake/alert/appropriate, moves all extremitiesx4.  No facial droop.  EXTREMITIES: pulses normal/equal, full ROM SKIN: warm, color normal PSYCH: no abnormalities of mood noted, alert and oriented to situation Results for orders placed or performed in visit on 02/08/15  POCT CBC  Result Value Ref Range   WBC 14.7 (A) 4.6 - 10.2 K/uL   Lymph, poc 3.1 0.6 - 3.4   POC LYMPH PERCENT 21.1 10 - 50 %L   MID (cbc) 0.3 0 - 0.9   POC MID % 1.8 0 - 12 %M   POC Granulocyte 11.3 (A) 2 - 6.9   Granulocyte percent 77.1 37 - 80 %G   RBC 5.15 4.04 - 5.48 M/uL   Hemoglobin 14.6 12.2 - 16.2 g/dL   HCT, POC 16.1 09.6 - 47.9 %   MCV 90.2 80 - 97 fL   MCH, POC 28.3 27 - 31.2 pg   MCHC 31.4 (A) 31.8 - 35.4 g/dL   RDW, POC 04.5 %   Platelet Count, POC 347 142 - 424 K/uL   MPV 7.4 0 - 99.8 fL    Filed Vitals:   02/08/15 1101  BP: 117/81  Pulse: 98  Temp: 98.4 F (36.9 C)  TempSrc: Oral  Resp: 16  Height: 5' (1.524 m)  Weight: 196 lb (88.905 kg)  SpO2: 98%   Meds ordered this encounter  Medications  . ranitidine (ZANTAC) tablet 150 mg    Sig:   . Diphenhyd-Hydrocort-Nystatin (FIRST-DUKES MOUTHWASH) SUSP    Sig: 1 teaspoon as rinse gargle and spit 4 times a day    Dispense:  120 mL    Refill:  1  . ranitidine (ZANTAC) 150 MG tablet    Sig: Take 1 tablet (150 mg total) by mouth 2 (two) times daily.    Dispense:  60 tablet    Refill:  0      Assessment & Plan:    Patient given 150 of Zantac in the office. I called poison control and they recommended taking the product to the health department and they would analyze the product clear. I advised the patient that we would put her on a Dukes mouthwash for relief of her mouth discomfort. She will be given a note for work today. Comprehensive metabolic panel was done and sent. She is agreeable to go to the service station where she purchased her product and advised them to take it off the shelves. I told her they could call our office for verification this needs to be done. Hopefully we will hear back from the health department soon and be able to have her take the product to the health department so it can be sent for evaluation.I personally performed the services described in this documentation, which was scribed in my presence. The recorded information has been reviewed and is accurate. Patient took the product with her.  Earl Lites, MD

## 2015-02-08 NOTE — Telephone Encounter (Signed)
Dr Cleta Alberts the health department was called and I left a message asking for someone to call us back regarding the next step with an possible food poisoning.  I stated on the voicemail the pt ate some foreign substance.

## 2015-02-08 NOTE — Telephone Encounter (Signed)
Please call the health department again. Patient has food with her she feels was contaminated. She would like to bring the food by for the health department to sample. Please call the health department again so this can be done.

## 2015-02-09 NOTE — Telephone Encounter (Signed)
Helen Greene and Dr. Cleta Alberts spoke to health department. Health Department will be in contact with pt.

## 2015-02-11 ENCOUNTER — Telehealth: Payer: Self-pay | Admitting: Family Medicine

## 2015-02-12 NOTE — Telephone Encounter (Signed)
Patient has a contact number to call to handle that situation

## 2015-02-13 ENCOUNTER — Telehealth: Payer: Self-pay | Admitting: Hematology

## 2015-02-13 ENCOUNTER — Ambulatory Visit (INDEPENDENT_AMBULATORY_CARE_PROVIDER_SITE_OTHER): Payer: 59 | Admitting: Family Medicine

## 2015-02-13 VITALS — BP 138/74 | HR 88 | Temp 98.4°F | Resp 20 | Ht 60.0 in | Wt 196.2 lb

## 2015-02-13 DIAGNOSIS — Z23 Encounter for immunization: Secondary | ICD-10-CM | POA: Diagnosis not present

## 2015-02-13 DIAGNOSIS — R51 Headache: Secondary | ICD-10-CM

## 2015-02-13 DIAGNOSIS — D72829 Elevated white blood cell count, unspecified: Secondary | ICD-10-CM | POA: Diagnosis not present

## 2015-02-13 DIAGNOSIS — E669 Obesity, unspecified: Secondary | ICD-10-CM | POA: Diagnosis not present

## 2015-02-13 DIAGNOSIS — Z0000 Encounter for general adult medical examination without abnormal findings: Secondary | ICD-10-CM | POA: Diagnosis not present

## 2015-02-13 DIAGNOSIS — G4730 Sleep apnea, unspecified: Secondary | ICD-10-CM

## 2015-02-13 DIAGNOSIS — N200 Calculus of kidney: Secondary | ICD-10-CM

## 2015-02-13 DIAGNOSIS — I10 Essential (primary) hypertension: Secondary | ICD-10-CM

## 2015-02-13 DIAGNOSIS — R519 Headache, unspecified: Secondary | ICD-10-CM

## 2015-02-13 LAB — POCT URINALYSIS DIPSTICK
Bilirubin, UA: NEGATIVE
Glucose, UA: NEGATIVE
Ketones, UA: NEGATIVE
Leukocytes, UA: NEGATIVE
Nitrite, UA: NEGATIVE
Protein, UA: 100
Spec Grav, UA: 1.025
Urobilinogen, UA: 0.2
pH, UA: 7

## 2015-02-13 LAB — POCT CBC
Granulocyte percent: 68.7 %G (ref 37–80)
HCT, POC: 43 % (ref 37.7–47.9)
Hemoglobin: 13.1 g/dL (ref 12.2–16.2)
Lymph, poc: 3.5 — AB (ref 0.6–3.4)
MCH, POC: 27.1 pg (ref 27–31.2)
MCHC: 30.4 g/dL — AB (ref 31.8–35.4)
MCV: 89.1 fL (ref 80–97)
MID (cbc): 0.6 (ref 0–0.9)
MPV: 7.2 fL (ref 0–99.8)
POC Granulocyte: 8.9 — AB (ref 2–6.9)
POC LYMPH PERCENT: 27 %L (ref 10–50)
POC MID %: 4.3 %M (ref 0–12)
Platelet Count, POC: 367 10*3/uL (ref 142–424)
RBC: 4.82 M/uL (ref 4.04–5.48)
RDW, POC: 13 %
WBC: 13 10*3/uL — AB (ref 4.6–10.2)

## 2015-02-13 LAB — POCT GLYCOSYLATED HEMOGLOBIN (HGB A1C): Hemoglobin A1C: 5.1

## 2015-02-13 LAB — POCT UA - MICROSCOPIC ONLY
Bacteria, U Microscopic: NEGATIVE
Casts, Ur, LPF, POC: NEGATIVE
Crystals, Ur, HPF, POC: NEGATIVE
Mucus, UA: NEGATIVE
Yeast, UA: NEGATIVE

## 2015-02-13 MED ORDER — LISINOPRIL-HYDROCHLOROTHIAZIDE 10-12.5 MG PO TABS
1.0000 | ORAL_TABLET | Freq: Every day | ORAL | Status: DC
Start: 2015-02-13 — End: 2016-05-07

## 2015-02-13 MED ORDER — NARATRIPTAN HCL 2.5 MG PO TABS: 2.5000 mg | ORAL_TABLET | ORAL | Status: DC | PRN

## 2015-02-13 MED ORDER — TOPIRAMATE 50 MG PO TABS: 50.0000 mg | ORAL_TABLET | Freq: Every day | ORAL | Status: DC

## 2015-02-13 NOTE — Telephone Encounter (Signed)
Called pt to schedule new patient appt.  Left message  Dx: Elevated WBC count Referring: Dr. Cleta Alberts

## 2015-02-13 NOTE — Patient Instructions (Signed)

## 2015-02-13 NOTE — Addendum Note (Signed)
Addended by: Johnnette Litter on: 02/13/2015 08:15 PM   Modules accepted: Orders, SmartSet

## 2015-02-13 NOTE — Progress Notes (Addendum)
Subjective:    Patient ID: Helen Greene, female    DOB: 06-19-1970, 44 y.o.   MRN: [id number] This chart was scribed for Elvina Sidle, MD by Littie Deeds, Medical Scribe. This patient was seen in Room 5 and the patient's care was started at 6:48 PM.   HPI HPI Comments: Helen Greene is a 44 y.o. female with a history of hypertension and sleep apnea who presents to the [facility] for a complete physical exam. Patient was seen here last week by Dr. Patsy Lager for hypertension and headache; she was prescribed hydrochlorothiazide at that time as well as amoxicillin. She had been on lisinopril-HCTZ previously. She still has headaches and believes her headaches may be due to stress. She had been seeing Dr. Clarisse Gouge at the headache clinic previously. The medication that she had been on at that time was effective for her headaches, but she does not remember the name of the medication. Patient denies visual changes. Her menstrual period started today. She also notes that she has kidney stones she is trying to pass.   Patient has an appointment with her gynecologist and will also have her eyes checked soon. She declines flu shot at this time.  Patient also would like to discuss weight loss. She sits down at her job most of the day and notes that she is not physically active.  Patient works at [employer]. She has 2 children. One of her sons lives with the father, and her other son is getting married.  She has not been able to see her PCP, Dr. Merri Brunette, because she owes money at another [employer].  Review of Systems  Eyes: Negative for visual disturbance.  Neurological: Positive for headaches.       Objective:   Physical Exam CONSTITUTIONAL: Well developed/well nourished HEAD: Normocephalic/atraumatic EYES: EOM/PERRL ENMT: Mucous membranes moist NECK: supple no meningeal signs SPINE: entire spine nontender CV: S1/S2 noted, no murmurs/rubs/gallops noted LUNGS: Lungs are  clear to auscultation bilaterally, no apparent distress ABDOMEN: soft, nontender, no rebound or guarding GU: no cva tenderness NEURO: Pt is awake/alert, moves all extremitiesx4 EXTREMITIES: pulses normal, full ROM SKIN: warm, color normal PSYCH: no abnormalities of mood noted Results for orders placed or performed in visit on 02/13/15  POCT CBC  Result Value Ref Range   WBC 13.0 (A) 4.6 - 10.2 K/uL   Lymph, poc 3.5 (A) 0.6 - 3.4   POC LYMPH PERCENT 27.0 10 - 50 %L   MID (cbc) 0.6 0 - 0.9   POC MID % 4.3 0 - 12 %M   POC Granulocyte 8.9 (A) 2 - 6.9   Granulocyte percent 68.7 37 - 80 %G   RBC 4.82 4.04 - 5.48 M/uL   Hemoglobin 13.1 12.2 - 16.2 g/dL   HCT, POC 78.2 95.6 - 47.9 %   MCV 89.1 80 - 97 fL   MCH, POC 27.1 27 - 31.2 pg   MCHC 30.4 (A) 31.8 - 35.4 g/dL   RDW, POC 21.3 %   Platelet Count, POC 367 142 - 424 K/uL   MPV 7.2 0 - 99.8 fL  POCT glycosylated hemoglobin (Hb A1C)  Result Value Ref Range   Hemoglobin A1C 5.1   POCT urinalysis dipstick  Result Value Ref Range   Color, UA orange    Clarity, UA cloudy    Glucose, UA neg    Bilirubin, UA neg    Ketones, UA neg    Spec Grav, UA 1.025  Blood, UA large    pH, UA 7.0    Protein, UA 100    Urobilinogen, UA 0.2    Nitrite, UA neg    Leukocytes, UA Negative Negative  POCT UA - Microscopic Only  Result Value Ref Range   WBC, Ur, HPF, POC 0-2    RBC, urine, microscopic TNTC    Bacteria, U Microscopic neg    Mucus, UA neg    Epithelial cells, urine per micros 0-3    Crystals, Ur, HPF, POC neg    Casts, Ur, LPF, POC neg    Yeast, UA neg        Assessment & Plan:   This chart was scribed in my presence and reviewed by me personally.    ICD-9-CM ICD-10-CM   1. Annual physical exam V70.0 Z00.00 POCT CBC     BASIC METABOLIC PANEL WITH GFR     POCT glycosylated hemoglobin (Hb A1C)     Lipid panel     TSH     Tdap vaccine greater than or equal to 7yo IM  2. Sleep apnea 780.57 G47.30   3. Frequent headaches  784.0 R51 topiramate (TOPAMAX) 50 MG tablet     naratriptan (AMERGE) 2.5 MG tablet  4. Obesity 278.00 E66.9   5. Essential hypertension 401.9 I10 lisinopril-hydrochlorothiazide (PRINZIDE,ZESTORETIC) 10-12.5 MG per tablet  6. Kidney stones 592.0 N20.0 POCT urinalysis dipstick     POCT UA - Microscopic Only     Signed, Elvina Sidle, MD

## 2015-02-15 ENCOUNTER — Telehealth: Payer: Self-pay | Admitting: Hematology

## 2015-02-15 NOTE — Telephone Encounter (Signed)
Called pt a second time.  Left second message.

## 2015-02-19 ENCOUNTER — Telehealth: Payer: Self-pay | Admitting: Family Medicine

## 2015-02-19 NOTE — Telephone Encounter (Signed)
lmom of results and to call back if any questions

## 2015-02-23 ENCOUNTER — Encounter: Payer: Self-pay | Admitting: Obstetrics & Gynecology

## 2015-02-25 LAB — LIPID PANEL
Chol/HDL Ratio: 4.8 ratio units — ABNORMAL HIGH (ref 0.0–4.4)
Cholesterol, Total: 200 mg/dL — ABNORMAL HIGH (ref 100–199)
HDL: 42 mg/dL (ref >39)
LDL Calculated: 113 mg/dL — ABNORMAL HIGH (ref 0–99)
Triglycerides: 223 mg/dL — ABNORMAL HIGH (ref 0–149)
VLDL Cholesterol Cal: 45 mg/dL — ABNORMAL HIGH (ref 5–40)

## 2015-02-25 LAB — BMP8+EGFR
BUN/Creatinine Ratio: 16 (ref 9–23)
BUN: 14 mg/dL (ref 6–24)
CO2: 24 mmol/L (ref 18–29)
Calcium: 9.5 mg/dL (ref 8.7–10.2)
Chloride: 98 mmol/L (ref 97–108)
Creatinine, Ser: 0.86 mg/dL (ref 0.57–1.00)
GFR calc Af Amer: 94 mL/min/{1.73_m2} (ref >59)
GFR calc non Af Amer: 82 mL/min/{1.73_m2} (ref >59)
Glucose: 95 mg/dL (ref 65–99)
Potassium: 4.3 mmol/L (ref 3.5–5.2)
Sodium: 139 mmol/L (ref 134–144)

## 2015-02-25 LAB — TSH: TSH: 2.54 u[IU]/mL (ref 0.450–4.500)

## 2015-03-02 ENCOUNTER — Telehealth: Payer: Self-pay | Admitting: Neurology

## 2015-03-02 NOTE — Telephone Encounter (Signed)
Seen in July 2015, was supposed to come back for FU. Please schedule.

## 2015-03-02 NOTE — Telephone Encounter (Signed)
Patient called and wants to come in and complete her sleep study.  The one in the system is marked complete so I didn't know what to tell her.  Please call patient and help her

## 2015-03-02 NOTE — Telephone Encounter (Signed)
Looks like from message on 03/04/14, patient did not have sleep apnea. Do you want me to schedule her a f/u appt to discuss the results with you?

## 2015-03-02 NOTE — Telephone Encounter (Signed)
I spoke to patient and was able to make her an appt for next week to discuss last sleep study and treatment.

## 2015-03-06 ENCOUNTER — Encounter: Payer: Self-pay | Admitting: Neurology

## 2015-03-06 ENCOUNTER — Ambulatory Visit (INDEPENDENT_AMBULATORY_CARE_PROVIDER_SITE_OTHER): Payer: 59 | Admitting: Neurology

## 2015-03-06 VITALS — BP 104/70 | HR 80 | Resp 16 | Ht 60.0 in | Wt 192.0 lb

## 2015-03-06 DIAGNOSIS — G4761 Periodic limb movement disorder: Secondary | ICD-10-CM | POA: Diagnosis not present

## 2015-03-06 DIAGNOSIS — R635 Abnormal weight gain: Secondary | ICD-10-CM

## 2015-03-06 DIAGNOSIS — G2581 Restless legs syndrome: Secondary | ICD-10-CM

## 2015-03-06 MED ORDER — PRAMIPEXOLE DIHYDROCHLORIDE 0.125 MG PO TABS: ORAL_TABLET | ORAL | Status: DC

## 2015-03-06 NOTE — Progress Notes (Signed)
Subjective:    Patient ID: Helen Greene is a 44 y.o. female.  HPI     Interim history:   Helen Greene is a 44 year old right-handed woman with an underlying medical history of reflux disease, obesity,  Migraines,depression and anxiety, leg pain and stiffness, who presents for follow-up consultation of her sleep related complaints, including excessive daytime somnolence. The patient is unaccompanied today. I first met her on 01/06/2014 at the request of her psychiatrist, at which time the patient reported as 7-8 year history of excessive daytime somnolence. She also reported loud snoring. She was told that she had apneic pauses while asleep. She did endorse also some restless leg symptoms and moving a lot during sleep. I invited her back for sleep study. She had a baseline sleep study on 02/17/2014 and I went over her test results with her in detail today. Her sleep efficiency was reduced at 78.7% with a latency to sleep of 7 minutes and wake after sleep onset of 78 minutes with overall mild sleep fragmentation noted. Her arousal index was 27.7 per hour, due primarily to periodic limb movements. She had an increased percentage of stage II sleep, a normal percentage of slow-wave sleep and a decreased percentage of REM sleep at 6.1% with a highly prolonged REM latency of 338.5 minutes. She had moderate PLMS with an index of 28 per hour, resulting in 13.6 arousals per hour. She had no significant EKG or EEG changes and mild intermittent snoring was noted. She had a total AHI based on 24 obstructive hypopneas of 4.4 per hour, rising to 23.7 per hour during the supine position. Baseline oxygen saturation was 94%, nadir was 89%. Time below 90% saturation was 2 minutes and 45 seconds.  Today, 03/06/2015: She reports that she gained  Weight in the last year, especially after losing her job. She started working for [employer]  In February of this year. She has not really tried to lose weight. She still feels tired  during the day. She wakes up more often when she sleeps on her back. She feels that her restless leg symptoms are the same or worse. Sometimes she wakes up with soreness in her legs. She also has pain in other areas and takes oxycodone as needed, also for kidney stone pain.  She admits that she delayed follow-up after her sleep study.  Previously:   01/06/14: She reports EDS for the past 7-8 years. Her ESS is 16/24. She reports loud snoring. She has not been told, that she has apneas. She does endorse occasional RLS symptoms and moves a lot in her sleep. She has been suffering from night sweats lately. She has occasional morning headaches, which are slight. She has reflux symptoms and has to take omeprazole 2 times a day. She is recently divorced.   Her typical bedtime is reported to be around 10:30 PM and usual wake time is around 6:40 to 7 AM. Sleep onset typically occurs within minutes. She reports feeling poorly rested upon awakening. She wakes up on an average 1 times in the middle of the night and has to go to the bathroom 1 times on a typical night.   She has been known to snore for the past few years. Snoring is reportedly moderate.   She denies cataplexy, sleep paralysis, hypnagogic or hypnopompic hallucinations, or sleep attacks. She does not report any vivid dreams, nightmares, dream enactments, or parasomnias, such as sleep talking or sleep walking. The patient has not had a sleep study or a  home sleep test.   She consumes 0 caffeinated beverages per day. She drinks alcohol occasionally and does not smoke.   Her bedroom is usually dark and cool. There is no TV in the bedroom and usually her cat sleeps in the bed.   Her Past Medical History Is Significant For: Past Medical History  Diagnosis Date  . Elevated white blood cell count   . Anxiety   . GERD (gastroesophageal reflux disease)   . Elevated white blood cell count 09/25/2011  . Kidney stone   . Allergy   . Depression   .  Complication of anesthesia   . PONV (postoperative nausea and vomiting)   . Sleep apnea     does not have CPAP machine  . Shortness of breath dyspnea     with exertion  . Neuromuscular disorder     carpel tunnel disease    Her Past Surgical History Is Significant For: Past Surgical History  Procedure Laterality Date  . Cholecystectomy    . Ectopic pregnancy surgery    . Wisdom tooth extraction    . Dilation and curettage of uterus  1993  . Essure tubal ligation      Her Family History Is Significant For: Family History  Problem Relation Age of Onset  . Hypertension Mother   . Heart failure Mother   . Cancer Mother   . Heart disease Mother   . Heart disease Brother   . Hypertension Brother   . Cancer Father     Leukemia  . Hypertension Father   . Cancer Sister     Her Social History Is Significant For: Social History   Social History  . Marital Status: Divorced    Spouse Name: N/A  . Number of Children: N/A  . Years of Education: N/A   Social History Main Topics  . Smoking status: Never Smoker   . Smokeless tobacco: Never Used  . Alcohol Use: No  . Drug Use: No  . Sexual Activity: Yes   Other Topics Concern  . None   Social History Narrative    Her Allergies Are:  Allergies  Allergen Reactions  . Bee Venom Hives and Swelling  . Enablex [Darifenacin Hydrobromide] Other (See Comments)    Phlebitis.  . Erythromycin Hives  . Macrobid [Nitrofurantoin Monohyd Macro] Nausea And Vomiting  . Nitrofuran Derivatives Nausea And Vomiting  :   Her Current Medications Are:  Outpatient Encounter Prescriptions as of 03/06/2015  Medication Sig  . buPROPion (WELLBUTRIN XL) 150 MG 24 hr tablet TK 1 T PO QAM  . clonazePAM (KLONOPIN) 1 MG tablet TK 1 T PO ONCE D PRA  . escitalopram (LEXAPRO) 10 MG tablet TK 1 T PO QD  . lamoTRIgine (LAMICTAL) 200 MG tablet TK 1 T PO QD  . lisinopril-hydrochlorothiazide (PRINZIDE,ZESTORETIC) 10-12.5 MG per tablet Take 1 tablet by  mouth daily.  . naratriptan (AMERGE) 2.5 MG tablet Take 1 tablet (2.5 mg total) by mouth as needed for migraine. Take one (1) tablet at onset of headache; if returns or does not resolve, may repeat after 4 hours; do not exceed five (5) mg in 24 hours.  . Oxycodone HCl 10 MG TABS TK 1 T PO Q 4 TO 6 H PRN P  . Phenyleph-CPM-DM-APAP (TYLENOL COLD HEAD CONGESTION PO) Take by mouth.  . ranitidine (ZANTAC) 150 MG tablet   . topiramate (TOPAMAX) 50 MG tablet Take 1 tablet (50 mg total) by mouth at bedtime.  . [DISCONTINUED] acetaminophen (TYLENOL) 500 MG  tablet Take 500 mg by mouth every 6 (six) hours as needed for mild pain.  . [DISCONTINUED] Diphenhyd-Hydrocort-Nystatin (FIRST-DUKES MOUTHWASH) SUSP 1 teaspoon as rinse gargle and spit 4 times a day  . [DISCONTINUED] fluconazole (DIFLUCAN) 150 MG tablet Take 1 tablet (150 mg total) by mouth once. Repeat if needed (Patient not taking: Reported on 02/13/2015)   No facility-administered encounter medications on file as of 03/06/2015.  :  Review of Systems:  Out of a complete 14 point review of systems, all are reviewed and negative with the exception of these symptoms as listed below:  Review of Systems  Neurological: Positive for headaches.       Patient is here to discuss sleep study.       Objective:  Neurologic Exam  Physical Exam Physical Examination:   Filed Vitals:   03/06/15 1113  BP: 104/70  Pulse: 80  Resp: 16   General Examination: The patient is a very pleasant 44 y.o. female in no acute distress. She appears well-developed and well-nourished and well groomed. She has gained weight about 26 lb since I first met her.   HEENT: Normocephalic, atraumatic, pupils are equal, round and reactive to light and accommodation. Funduscopic exam is normal with sharp disc margins noted. Extraocular tracking is good without limitation to gaze excursion or nystagmus noted. Normal smooth pursuit is noted. Hearing is grossly intact. Face is  symmetric with normal facial animation and normal facial sensation. Speech is clear with no dysarthria noted. There is no hypophonia. There is no lip, neck/head, jaw or voice tremor. Neck is supple with full range of passive and active motion. There are no carotid bruits on auscultation. Oropharynx exam reveals: mild mouth dryness, adequate dental hygiene and mild to moderate airway crowding, due to larger tongue, longer uvula and tonsils in place. Mallampati is class II. Tongue protrudes centrally and palate elevates symmetrically. Tonsils are 1+ in size, R>L.   Chest: Clear to auscultation without wheezing, rhonchi or crackles noted.  Heart: S1+S2+0, regular and normal without murmurs, rubs or gallops noted.   Abdomen: Soft, non-tender and non-distended with normal bowel sounds appreciated on auscultation.  Extremities: There is no pitting edema in the distal lower extremities bilaterally. Pedal pulses are intact.  Skin: Warm and dry without trophic changes noted. There are no varicose veins.  Musculoskeletal: exam reveals no obvious joint deformities, tenderness or joint swelling or erythema.   Neurologically:  Mental status: The patient is awake, alert and oriented in all 4 spheres. Her immediate and remote memory, attention, language skills and fund of knowledge are appropriate. There is no evidence of aphasia, agnosia, apraxia or anomia. Speech is clear with normal prosody and enunciation. Thought process is linear. Mood is normal and affect is normal.  Cranial nerves II - XII are as described above under HEENT exam. In addition: shoulder shrug is normal with equal shoulder height noted. Motor exam: Normal bulk, strength and tone is noted. There is no drift, tremor or rebound. Romberg is negative. Reflexes are 2+ throughout. Fine motor skills and coordination: intact with normal finger taps, normal hand movements, normal rapid alternating patting, normal foot taps and normal foot agility.   Cerebellar testing: No dysmetria or intention tremor on finger to nose testing. Heel to shin is unremarkable bilaterally. There is no truncal or gait ataxia.  Sensory exam: intact to light touch, pinprick, vibration, temperature sense in the upper and lower extremities.  Gait, station and balance: She stands easily. No veering to one side is  noted. No leaning to one side is noted. Posture is age-appropriate and stance is narrow based. Gait shows normal stride length and normal pace. No problems turning are noted. She turns en bloc. Tandem walk is unremarkable.  Assessment and Plan:   In summary, TEKISHA DARCEY is a very pleasant 44 year old female with an underlying medical history of reflux disease, obesity, depression and anxiety, leg pain and stiffness, who presents for follow-up consultation of her sleep disturbance, after her sleep study in September 2015.  We talked about her sleep test results in detail today. She does not have evidence of significant obstructive sleep disordered breathing except for when she sleeps on her back. Unfortunately, in the interim, she has gained significant amount of weight which may have caused her to have or significant sleep disordered breathing. I advised her strongly to try to lose weight and sleep on her sides. She had significant periodic limb movements during her sleep study which cause some sleep disruption. She endorses restless leg symptoms and waking up with sore legs. She used to have symptoms of restless legs even when she was a teenager. She had blood work through her primary care provider on 02/13/2015 which we reviewed. She is not anemic. She had a normal TSH and borderline cholesterol findings and borderline triglycerides. She was advised to try to lose weight. 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 I would like to try her on low-dose Mirapex. Of note, her generic Lexapro may be a contributor to her symptoms  but she recalls having had restless leg symptoms even before she was ever on antidepressives.  We will start with Mirapex 0.125  Milligrams strength, 1 pill at night with incremental increase weekly to up to 0.375 mg each night. She is advised to take at 90 minutes to 120 minutes before her projected bedtime.  We will do some additional blood work today including iron, TIBC and ferritin. If her ferritin is less than 50 she may benefit from taking over-the-counter iron supplements. I will see her back in a few months, sooner if needed. I answered all her questions today and she was in agreement. I spent 20 minutes in total face-to-face time with the patient, more than 50% of which was spent in counseling and coordination of care, reviewing test results, reviewing medication and discussing or reviewing the diagnosis of PLMD/RLS, the prognosis and treatment options.

## 2015-03-06 NOTE — Patient Instructions (Signed)
We will treat your restless legs symptoms and leg twitching at night with Mirapex (generic name: pramipexole) 0.125 mg: Take 1 pill each night for 1 week, the 2 pills each night for 1 week, then 3 pills each night thereafter. Common side effects reported are: Sedation, sleepiness, nausea, vomiting, and rare side effects are confusion, hallucinations, swelling in legs, and abnormal behaviors, including impulse control problems, which can manifest as excessive eating, obsessions with food or gambling, or hypersexuality.  We will do iron studies/blood work today, we will call you with the results.

## 2015-03-07 ENCOUNTER — Telehealth: Payer: Self-pay

## 2015-03-07 LAB — FERRITIN: Ferritin: 118 ng/mL (ref 15–150)

## 2015-03-07 LAB — IRON AND TIBC
IRON SATURATION: 22 % (ref 15–55)
Iron: 65 ug/dL (ref 27–159)
Total Iron Binding Capacity: 298 ug/dL (ref 250–450)
UIBC: 233 ug/dL (ref 131–425)

## 2015-03-07 NOTE — Telephone Encounter (Signed)
Left message with recommendation below.

## 2015-03-07 NOTE — Progress Notes (Signed)
Quick Note:  Please call patient back about her recent blood test results from yesterday. Iron studies are fine. We will proceed with Mirapex titration as discussed yesterday. Follow-up as previously discussed. No further action required.  [name], MD, PhD [facility] ([facility])  ______

## 2015-03-07 NOTE — Telephone Encounter (Signed)
-----   Message from [name], MD sent at 03/07/2015 12:21 PM EDT ----- Please call patient back about her recent blood test results from yesterday. Iron studies are fine. We will proceed with Mirapex titration as discussed yesterday. Follow-up as previously discussed. No further action required.  [name], MD, PhD [facility])

## 2015-03-10 ENCOUNTER — Ambulatory Visit (INDEPENDENT_AMBULATORY_CARE_PROVIDER_SITE_OTHER): Payer: 59 | Admitting: Physician Assistant

## 2015-03-10 ENCOUNTER — Telehealth: Payer: Self-pay | Admitting: Neurology

## 2015-03-10 VITALS — BP 115/78 | HR 91 | Temp 98.1°F | Resp 16 | Ht 60.0 in | Wt 193.6 lb

## 2015-03-10 DIAGNOSIS — H6092 Unspecified otitis externa, left ear: Secondary | ICD-10-CM

## 2015-03-10 DIAGNOSIS — R41840 Attention and concentration deficit: Secondary | ICD-10-CM

## 2015-03-10 DIAGNOSIS — G2581 Restless legs syndrome: Secondary | ICD-10-CM

## 2015-03-10 DIAGNOSIS — H6982 Other specified disorders of Eustachian tube, left ear: Secondary | ICD-10-CM | POA: Diagnosis not present

## 2015-03-10 DIAGNOSIS — R51 Headache: Secondary | ICD-10-CM

## 2015-03-10 DIAGNOSIS — J019 Acute sinusitis, unspecified: Secondary | ICD-10-CM

## 2015-03-10 DIAGNOSIS — J309 Allergic rhinitis, unspecified: Secondary | ICD-10-CM

## 2015-03-10 DIAGNOSIS — Z566 Other physical and mental strain related to work: Secondary | ICD-10-CM | POA: Diagnosis not present

## 2015-03-10 DIAGNOSIS — R519 Headache, unspecified: Secondary | ICD-10-CM

## 2015-03-10 MED ORDER — AMOXICILLIN-POT CLAVULANATE 875-125 MG PO TABS: 1.0000 | ORAL_TABLET | Freq: Two times a day (BID) | ORAL | Status: AC

## 2015-03-10 MED ORDER — CETIRIZINE HCL 10 MG PO TABS: 10.0000 mg | ORAL_TABLET | Freq: Every day | ORAL | Status: DC

## 2015-03-10 MED ORDER — OFLOXACIN 0.3 % OT SOLN: 10.0000 [drp] | Freq: Every day | OTIC | Status: AC

## 2015-03-10 MED ORDER — FLUTICASONE PROPIONATE 50 MCG/ACT NA SUSP: 2.0000 | Freq: Every day | NASAL | Status: DC

## 2015-03-10 NOTE — Telephone Encounter (Signed)
Talked to patient, more likely the topamax is causing paresthesias, not the mirapex. Sx are not very disturbing, she just wanted to make sure that it is not something to worry about. I encouraged her to continue with our plan to gradually titrate the Mirapex as discussed. She will be monitoring the numbness/tingling. She has not been on topamax very long either, she reports. She was agreeable with the plan.

## 2015-03-10 NOTE — Progress Notes (Signed)
[facility] [address] [place] [address] [phone]  Date:  03/10/2015   Name:  Helen Greene   DOB:  09-30-1970   MRN:  [id number]  PCP:  Allean Found, MD    Chief Complaint: Otalgia   History of Present Illness:  This is a 43 y.o. female with PMH RLS, frequent headaches, HTN who is presenting with left ear pain for "months". States about 5 months ago she went to another UC facility and was dx'd with otitis externa. She was prescribed drops and states sx resolved for 1 month. Sx have been back since then but getting worse over the past few weeks. She states over the past few months every time she has "crust" outside her ear she knows the infection is back and will put a few antibiotics drops in her ear. States for several months she has also been having more problems with her sinuses. She has sinus pressure and postnasal drip. She does have seasonal allergies but doesn't take any allergy medicines on a regular basis. She denies fever, chills, sore throat, cough, sob, wheezing, adenopathy.  Pt is currently under care of neurology, Dr. Frances Furbish, for frequent headaches and RLS. She is under care of psychiatrist, Dr. Nolen Mu, for anxiety. She states she recently got a new job and doesn't feel like she is performing well. She is afraid she is going to get fired. She states she is easily distracted and can't concentrate. She has never been dx'd with ADD before.She does have an upcoming appt with Dr. Nolen Mu.  Pt is discouraged by amount of medications she is on. She feels she is too young.  Review of Systems:  Review of Systems See HPI  Patient Active Problem List   Diagnosis Date Noted  . Frequent headaches 03/17/2015  . RLS (restless legs syndrome) 03/17/2015  . HTN (hypertension) 05/19/2013  . Elevated white blood cell count 09/25/2011    Prior to Admission medications   Medication Sig Start Date End Date Taking? Authorizing Provider  buPROPion  (WELLBUTRIN XL) 150 MG 24 hr tablet TK 1 T PO QAM 02/16/15  Yes Historical Provider, MD  clonazePAM (KLONOPIN) 1 MG tablet TK 1 T PO ONCE D PRA 01/02/15  Yes Historical Provider, MD  escitalopram (LEXAPRO) 10 MG tablet TK 1 T PO QD 01/02/15  Yes Historical Provider, MD  lamoTRIgine (LAMICTAL) 200 MG tablet TK 1 T PO QD 01/02/15  Yes Historical Provider, MD  lisinopril-hydrochlorothiazide (PRINZIDE,ZESTORETIC) 10-12.5 MG per tablet Take 1 tablet by mouth daily. 02/13/15  Yes Elvina Sidle, MD  naratriptan (AMERGE) 2.5 MG tablet Take 1 tablet (2.5 mg total) by mouth as needed for migraine. Take one (1) tablet at onset of headache; if returns or does not resolve, may repeat after 4 hours; do not exceed five (5) mg in 24 hours. 02/13/15  Yes Elvina Sidle, MD  Oxycodone HCl 10 MG TABS TK 1 T PO Q 4 TO 6 H PRN P 02/06/15  Yes Historical Provider, MD  Phenyleph-CPM-DM-APAP (TYLENOL COLD HEAD CONGESTION PO) Take by mouth.   Yes Historical Provider, MD  pramipexole (MIRAPEX) 0.125 MG tablet 1 pill each night x 1 week, then 2 pills each night x 1 week, then 3 pills each night thereafter. Take 90-120 minutes before bedtime. 03/06/15  Yes [name], MD  ranitidine (ZANTAC) 150 MG tablet  02/08/15  Yes Historical Provider, MD  topiramate (TOPAMAX) 50 MG tablet Take 1 tablet (50 mg total) by mouth at bedtime.  02/13/15  Yes Elvina Sidle, MD    Allergies  Allergen Reactions  . Bee Venom Hives and Swelling  . Enablex [Darifenacin Hydrobromide] Other (See Comments)    Phlebitis.  . Erythromycin Hives  . Macrobid [Nitrofurantoin Monohyd Macro] Nausea And Vomiting  . Nitrofuran Derivatives Nausea And Vomiting    Past Surgical History  Procedure Laterality Date  . Cholecystectomy    . Ectopic pregnancy surgery    . Wisdom tooth extraction    . Dilation and curettage of uterus  1993  . Essure tubal ligation      Social History  Substance Use Topics  . Smoking status: Never Smoker   . Smokeless tobacco:  Never Used  . Alcohol Use: No    Family History  Problem Relation Age of Onset  . Hypertension Mother   . Heart failure Mother   . Cancer Mother   . Heart disease Mother   . Heart disease Brother   . Hypertension Brother   . Cancer Father     Leukemia  . Hypertension Father   . Cancer Sister     Medication list has been reviewed and updated.  Physical Examination:  Physical Exam  Constitutional: She is oriented to person, place, and time. She appears well-developed and well-nourished. No distress.  HENT:  Head: Normocephalic and atraumatic.  Right Ear: Hearing, external ear and ear canal normal. Tympanic membrane is retracted.  Left Ear: Hearing, external ear and ear canal normal. Tympanic membrane is retracted.  Nose: Right sinus exhibits maxillary sinus tenderness and frontal sinus tenderness. Left sinus exhibits maxillary sinus tenderness and frontal sinus tenderness.  Mouth/Throat: Uvula is midline and mucous membranes are normal. Posterior oropharyngeal erythema present. No oropharyngeal exudate or posterior oropharyngeal edema.  Eyes: Conjunctivae and lids are normal. Right eye exhibits no discharge. Left eye exhibits no discharge. No scleral icterus.  Cardiovascular: Normal rate, regular rhythm, normal heart sounds and normal pulses.   No murmur heard. Pulmonary/Chest: Effort normal and breath sounds normal. No respiratory distress. She has no wheezes. She has no rhonchi. She has no rales.  Musculoskeletal: Normal range of motion.  Lymphadenopathy:       Head (right side): No submental, no submandibular and no tonsillar adenopathy present.       Head (left side): No submental, no submandibular and no tonsillar adenopathy present.    She has no cervical adenopathy.  Neurological: She is alert and oriented to person, place, and time.  Skin: Skin is warm, dry and intact. No lesion and no rash noted.  Psychiatric: Her speech is normal and behavior is normal. Thought  content normal.  Tearful at times   BP 115/78 mmHg  Pulse 91  Temp(Src) 98.1 F (36.7 C) (Oral)  Resp 16  Ht 5' (1.524 m)  Wt 193 lb 9.6 oz (87.816 kg)  BMI 37.81 kg/m2  SpO2 98%  LMP 02/13/2015  Assessment and Plan:  1. Acute sinusitis, recurrence not specified, unspecified location 2. ETD 3. Otitis externa, left 4. Allergic rhinitis Several possible contributions to symptoms. History does sound like otitis externa, even though canals clear on exam, possibly d/t intermittent usage of antibiotic ear drops. Will treat sinusitis with augmentin, OE with floxin otic. There is also very likely an allergic and ETD contribution. She does not currently take allergy medication. She was start on daily zyrtec and flonase. Return if sx not improved in 1 week. We discussed the importance of not taking leftover abx without first being evaluated -- she voiced  understanding. - amoxicillin-clavulanate (AUGMENTIN) 875-125 MG per tablet; Take 1 tablet by mouth 2 (two) times daily.  Dispense: 20 tablet; Refill: 0 - ofloxacin (FLOXIN OTIC) 0.3 % otic solution; Place 10 drops into the left ear daily.  Dispense: 5 mL; Refill: 0 - cetirizine (ZYRTEC ALLERGY) 10 MG tablet; Take 1 tablet (10 mg total) by mouth daily.  Dispense: 30 tablet; Refill: 11 - fluticasone (FLONASE) 50 MCG/ACT nasal spray; Place 2 sprays into both nostrils daily.  Dispense: 16 g; Refill: 12  5. Work-related stress 6. Difficulty concentrating Possible undiagnosed ADD. She will bring up at upcoming appt with psychiatrist, Dr. Nolen Mu.  7. Frequent headaches 8. RLS (restless legs syndrome) Follow up with neurology.   [place]ke Brackett, MHS [facility]  03/17/2015

## 2015-03-10 NOTE — Telephone Encounter (Signed)
Patient is calling. Since she has started taking pramipexole (MIRAPEX) 0.125 MG tablet this past Monday her hands feel numb and tingly the first thing in the morning but subsides during the day. Could this be from this medication? Please call and advise.

## 2015-03-10 NOTE — Patient Instructions (Signed)
Take antibiotic twice a day for 10 days. Put 10 drops in left ear x 7 days. Do not take leftover antibiotics without being seen first. Start taking zyrtec each night and flonase each day for your allergies and sinus issues. Should also help your ear pain. If you are not getting better in 2 weeks, let me know and I will refer to ENT. Discuss your focus problems with Dr. Nolen Mu.  Barotitis Media Barotitis media is inflammation of your middle ear. This occurs when the auditory tube (eustachian tube) leading from the back of your nose (nasopharynx) to your eardrum is blocked. This blockage may result from a cold, environmental allergies, or an upper respiratory infection. Unresolved barotitis media may lead to damage or hearing loss (barotrauma), which may become permanent. HOME CARE INSTRUCTIONS   Use medicines as recommended by your health care provider. Over-the-counter medicines will help unblock the canal and can help during times of air travel.  Do not put anything into your ears to clean or unplug them. Eardrops will not be helpful.  Do not swim, dive, or fly until your health care provider says it is all right to do so. If these activities are necessary, chewing gum with frequent, forceful swallowing may help. It is also helpful to hold your nose and gently blow to pop your ears for equalizing pressure changes. This forces air into the eustachian tube.  Only take over-the-counter or prescription medicines for pain, discomfort, or fever as directed by your health care provider.  A decongestant may be helpful in decongesting the middle ear and make pressure equalization easier. SEEK MEDICAL CARE IF:  You experience a serious form of dizziness in which you feel as if the room is spinning and you feel nauseated (vertigo).  Your symptoms only involve one ear. SEEK IMMEDIATE MEDICAL CARE IF:   You develop a severe headache, dizziness, or severe ear pain.  You have bloody or pus-like  drainage from your ears.  You develop a fever.  Your problems do not improve or become worse. MAKE SURE YOU:   Understand these instructions.  Will watch your condition.  Will get help right away if you are not doing well or get worse. Document Released: 05/31/2000 Document Revised: 03/24/2013 Document Reviewed: 12/29/2012 [facility] Patient Information 2015 [place], [place]. This information is not intended to replace advice given to you by your health care provider. Make sure you discuss any questions you have with your health care provider.

## 2015-03-15 ENCOUNTER — Telehealth: Payer: Self-pay

## 2015-03-15 NOTE — Telephone Encounter (Signed)
Pt says we are receiving a fax from Dr. Vela Prose concerning a prescription she use to take. She would like to start taking it for her headaches. She can't remember the name of the script. Please advise at [phone]

## 2015-03-17 DIAGNOSIS — R51 Headache: Secondary | ICD-10-CM

## 2015-03-17 DIAGNOSIS — R519 Headache, unspecified: Secondary | ICD-10-CM | POA: Insufficient documentation

## 2015-03-17 DIAGNOSIS — G2581 Restless legs syndrome: Secondary | ICD-10-CM | POA: Insufficient documentation

## 2015-03-17 NOTE — Telephone Encounter (Signed)
Did not see a fax from Dr. Cherie Ouch office. Request for medication list faxed with confirmation at 12:23pm. Will wait for fax.

## 2015-03-22 ENCOUNTER — Telehealth: Payer: Self-pay

## 2015-03-22 DIAGNOSIS — J329 Chronic sinusitis, unspecified: Secondary | ICD-10-CM

## 2015-03-22 DIAGNOSIS — H6980 Other specified disorders of Eustachian tube, unspecified ear: Secondary | ICD-10-CM

## 2015-03-22 MED ORDER — FLUCONAZOLE 150 MG PO TABS: 150.0000 mg | ORAL_TABLET | Freq: Once | ORAL | Status: DC

## 2015-03-22 NOTE — Telephone Encounter (Signed)
Please advise 

## 2015-03-22 NOTE — Telephone Encounter (Signed)
There is no mention of referral to ENT in PA-Helen Greene's note. I recommend she return to clinic for recheck. I will provide script of diflucan for antibiotic associated yeast infection. Please let patient know.

## 2015-03-22 NOTE — Telephone Encounter (Signed)
Pt has taken all of the medication but ear pain is still there and is going to need a refill and diflucan   Also mentioned that we might be referring her to ent office   Best number [phone]

## 2015-03-22 NOTE — Telephone Encounter (Signed)
Left message for pt to call back  °

## 2015-03-23 NOTE — Telephone Encounter (Signed)
Received a fax back from Dr. Cherie Ouch office on 03/17/15 that states patient has previously been prescribed Midrin, Lodine, and Amerge for her migraines.

## 2015-03-24 NOTE — Telephone Encounter (Signed)
Is Dr. Vela Prose her neurologist? I see that she saw neurologist Dr. Frances Furbish in Sept 2016... Ideally her neurologist would be managing her headache medications. Can we clarify why she wants [place] to fill these meds?

## 2015-03-27 ENCOUNTER — Other Ambulatory Visit: Payer: Self-pay | Admitting: Urgent Care

## 2015-03-27 NOTE — Telephone Encounter (Signed)
Left message for pt to call back  °

## 2015-03-27 NOTE — Telephone Encounter (Signed)
47\ °\1*-/ °

## 2015-03-28 ENCOUNTER — Other Ambulatory Visit: Payer: Self-pay | Admitting: Family Medicine

## 2015-03-28 DIAGNOSIS — B379 Candidiasis, unspecified: Secondary | ICD-10-CM

## 2015-03-28 MED ORDER — TERCONAZOLE 0.8 % VA CREA: 1.0000 | TOPICAL_CREAM | Freq: Every day | VAGINAL | Status: DC

## 2015-03-28 NOTE — Telephone Encounter (Signed)
Spoke with Helen Greene, she states Dr. Milus Glazier and her discussed medication for her migraines when she saw him. She also states she is having trouble getting rid of her yeast infection. She has taken 2 Diflucan and feels it is almost gone. Can we Rx something for Helen Greene? Please advise.

## 2015-03-28 NOTE — Telephone Encounter (Signed)
Spoke with Pt. She was advised 

## 2015-03-28 NOTE — Telephone Encounter (Signed)
Does patient need a refill on Diflucan?

## 2015-03-28 NOTE — Telephone Encounter (Signed)
I called in a vaginal cream that is used for resistant yeast infections

## 2015-03-30 ENCOUNTER — Telehealth: Payer: Self-pay | Admitting: Radiology

## 2015-03-30 NOTE — Telephone Encounter (Signed)
There is a note from Dr Clarisse GougeLewit at the desk, patient has previously been prescribed Midrin Lodine and Amerge in the past for headaches. Medical records has already sent a note to advise Dr Milus GlazierLauenstein.

## 2015-04-10 ENCOUNTER — Ambulatory Visit (INDEPENDENT_AMBULATORY_CARE_PROVIDER_SITE_OTHER): Payer: 59 | Admitting: Family Medicine

## 2015-04-10 ENCOUNTER — Telehealth: Payer: Self-pay

## 2015-04-10 VITALS — BP 104/58 | HR 87 | Temp 98.5°F | Resp 18 | Ht 59.75 in | Wt 194.0 lb

## 2015-04-10 DIAGNOSIS — H9203 Otalgia, bilateral: Secondary | ICD-10-CM | POA: Diagnosis not present

## 2015-04-10 DIAGNOSIS — H6523 Chronic serous otitis media, bilateral: Secondary | ICD-10-CM

## 2015-04-10 DIAGNOSIS — H9209 Otalgia, unspecified ear: Secondary | ICD-10-CM

## 2015-04-10 MED ORDER — FLUCONAZOLE 150 MG PO TABS: 150.0000 mg | ORAL_TABLET | Freq: Once | ORAL | Status: DC

## 2015-04-10 MED ORDER — AMOXICILLIN-POT CLAVULANATE 875-125 MG PO TABS: 1.0000 | ORAL_TABLET | Freq: Two times a day (BID) | ORAL | Status: DC

## 2015-04-10 NOTE — Telephone Encounter (Signed)
Orders Placed This Encounter  Procedures   Ambulatory referral to ENT    Referral Priority:   Routine    Referral Type:   Consultation    Referral Reason:   Specialty Services Required    Requested Specialty:   Otolaryngology    Number of Visits Requested:   1    

## 2015-04-10 NOTE — Patient Instructions (Signed)
Serous Otitis Media Serous otitis media is fluid in the middle ear space. This space contains the bones for hearing and air. Air in the middle ear space helps to transmit sound.  The air gets there through the eustachian tube. This tube goes from the back of the nose (nasopharynx) to the middle ear space. It keeps the pressure in the middle ear the same as the outside world. It also helps to drain fluid from the middle ear space. CAUSES  Serous otitis media occurs when the eustachian tube gets blocked. Blockage can come from:  Ear infections.  Colds and other upper respiratory infections.  Allergies.  Irritants such as cigarette smoke.  Sudden changes in air pressure (such as descending in an airplane).  Enlarged adenoids.  A mass in the nasopharynx. During colds and upper respiratory infections, the middle ear space can become temporarily filled with fluid. This can happen after an ear infection also. Once the infection clears, the fluid will generally drain out of the ear through the eustachian tube. If it does not, then serous otitis media occurs. SIGNS AND SYMPTOMS   Hearing loss.  A feeling of fullness in the ear, without pain.  Young children may not show any symptoms but may show slight behavioral changes, such as agitation, ear pulling, or crying. DIAGNOSIS  Serous otitis media is diagnosed by an ear exam. Tests may be done to check on the movement of the eardrum. Hearing exams may also be done. TREATMENT  The fluid most often goes away without treatment. If allergy is the cause, allergy treatment may be helpful. Fluid that persists for several months may require minor surgery. A small tube is placed in the eardrum to:  Drain the fluid.  Restore the air in the middle ear space. In certain situations, antibiotic medicines are used to avoid surgery. Surgery may be done to remove enlarged adenoids (if this is the cause). HOME CARE INSTRUCTIONS   Keep children away from  tobacco smoke.  Keep all follow-up visits as directed by your health care provider. SEEK MEDICAL CARE IF:   Your hearing is not better in 3 months.  Your hearing is worse.  You have ear pain.  You have drainage from the ear.  You have dizziness.  You have serous otitis media only in one ear or have any bleeding from your nose (epistaxis).  You notice a lump on your neck. MAKE SURE YOU:  Understand these instructions.   Will watch your condition.   Will get help right away if you are not doing well or get worse.    This information is not intended to replace advice given to you by your health care provider. Make sure you discuss any questions you have with your health care provider.   Document Released: 08/24/2003 Document Revised: 06/24/2014 Document Reviewed: 12/29/2012 Elsevier Interactive Patient Education 2016 [employer]  

## 2015-04-10 NOTE — Progress Notes (Signed)
Subjective:    Patient ID: Helen AweLiesa L Teffeteller, female    DOB: 16-Jan-1971, 44 y.o.   MRN: [id number] This chart was scribed for Elvina SidleKurt Lauenstein, MD by Littie Deedsichard Sun, Medical Scribe. This patient was seen in Room 9 and the patient's care was started at 6:12 PM.   HPI HPI Comments: Helen Greene is a 44 y.o. female who [facility] complaining of left ear pain that started a few months ago. Patient has noticed popping in both of her ears. She also reports having some rhinorrhea and postnasal drip. She saw Lanier ClamNicole Bush, PA-C 1 month ago for her ear pain and was given antibiotics and ear drops. Patient denies cough and dental pain. No pain in her right ear per patient. She would like a referral to ENT if her pain does not improve.  Note by Lanier ClamNicole Bush, PA-C on 03/10/15: This is a 44 y.o. female with PMH RLS, frequent headaches, HTN who is presenting with left ear pain for "months". States about 5 months ago she went to another UC facility and was dx'd with otitis externa. She was prescribed drops and states sx resolved for 1 month. Sx have been back since then but getting worse over the past few weeks. She states over the past few months every time she has "crust" outside her ear she knows the infection is back and will put a few antibiotics drops in her ear. States for several months she has also been having more problems with her sinuses. She has sinus pressure and postnasal drip. She does have seasonal allergies but doesn't take any allergy medicines on a regular basis. She denies fever, chills, sore throat, cough, sob, wheezing, adenopathy. Pt is currently under care of neurology, Dr. Frances FurbishAthar, for frequent headaches and RLS. She is under care of psychiatrist, Dr. Nolen MuMcKinney, for anxiety. She states she recently got a new job and doesn't feel like she is performing well. She is afraid she is going to get fired. She states she is easily distracted and can't concentrate. She has never  been dx'd with ADD before.She does have an upcoming appt with Dr. Nolen MuMcKinney. Pt is discouraged by amount of medications she is on. She feels she is too young.  Review of Systems  HENT: Positive for ear pain, postnasal drip and rhinorrhea. Negative for dental problem.   Respiratory: Negative for cough.        Objective:   Physical Exam CONSTITUTIONAL: Well developed/well nourished HEAD: Normocephalic/atraumatic EYES: EOM/PERRL ENMT: Mucous membranes moist. Mild nasal congestion.  Bilateral cloudy serous bubbles behind each of her tympanic membranes without erythema NECK: supple no meningeal signs SPINE: entire spine nontender GU: no cva tenderness NEURO: Pt is awake/alert, moves all extremitiesx4 EXTREMITIES: pulses normal, full ROM SKIN: warm, color normal PSYCH: no abnormalities of mood noted        Assessment & Plan:   By signing my name below, I, Littie Deedsichard Sun, attest that this documentation has been prepared under the direction and in the presence of Elvina SidleKurt Lauenstein, MD.  Electronically Signed: Littie Deedsichard Sun, Medical Scribe. 04/10/2015. 6:12 PM.  This chart was scribed in my presence and reviewed by me personally.    ICD-9-CM ICD-10-CM   1. Otalgia of both ears 388.70 H92.03 amoxicillin-clavulanate (AUGMENTIN) 875-125 MG tablet     Ambulatory referral to ENT  2. Bilateral chronic serous otitis media 381.10 H65.23 amoxicillin-clavulanate (AUGMENTIN) 875-125 MG tablet     Ambulatory referral to ENT  Signed, Elvina Sidle, MD

## 2015-04-10 NOTE — Telephone Encounter (Signed)
Pt saw Lanier Clamicole Bush [phone] for ear pain and it the symptoms have not improved. Pt would like a referral to an ENT instead of [facility]; she states that nothing about her condition has changed nor does she have the money for another copay. Please advise.

## 2015-04-11 NOTE — Telephone Encounter (Signed)
Pt.notified

## 2015-04-14 ENCOUNTER — Other Ambulatory Visit: Payer: Self-pay | Admitting: Emergency Medicine

## 2015-05-22 ENCOUNTER — Ambulatory Visit (INDEPENDENT_AMBULATORY_CARE_PROVIDER_SITE_OTHER): Payer: 59 | Admitting: Physician Assistant

## 2015-05-22 VITALS — BP 104/70 | HR 99 | Temp 98.9°F | Resp 16 | Ht 59.5 in | Wt 192.2 lb

## 2015-05-22 DIAGNOSIS — N9489 Other specified conditions associated with female genital organs and menstrual cycle: Secondary | ICD-10-CM | POA: Diagnosis not present

## 2015-05-22 DIAGNOSIS — N898 Other specified noninflammatory disorders of vagina: Secondary | ICD-10-CM

## 2015-05-22 DIAGNOSIS — R059 Cough, unspecified: Secondary | ICD-10-CM

## 2015-05-22 DIAGNOSIS — R05 Cough: Secondary | ICD-10-CM

## 2015-05-22 DIAGNOSIS — J029 Acute pharyngitis, unspecified: Secondary | ICD-10-CM

## 2015-05-22 LAB — POCT URINALYSIS DIP (MANUAL ENTRY)
BILIRUBIN UA: NEGATIVE
Blood, UA: NEGATIVE
GLUCOSE UA: NEGATIVE
Ketones, POC UA: NEGATIVE
NITRITE UA: NEGATIVE
Spec Grav, UA: 1.025
Urobilinogen, UA: 0.2
pH, UA: 6

## 2015-05-22 LAB — POCT WET + KOH PREP
Trich by wet prep: ABSENT
YEAST BY KOH: ABSENT
YEAST BY WET PREP: ABSENT

## 2015-05-22 MED ORDER — MAGIC MOUTHWASH W/LIDOCAINE: 10.0000 mL | ORAL | Status: DC | PRN

## 2015-05-22 MED ORDER — BENZONATATE 100 MG PO CAPS: 100.0000 mg | ORAL_CAPSULE | Freq: Three times a day (TID) | ORAL | Status: DC | PRN

## 2015-05-22 MED ORDER — HYDROCOD POLST-CPM POLST ER 10-8 MG/5ML PO SUER: 5.0000 mL | Freq: Two times a day (BID) | ORAL | Status: DC | PRN

## 2015-05-22 NOTE — Progress Notes (Signed)
[facility] [address], [place] [address] [phone]  Date:  05/22/2015   Name:  Helen Greene   DOB:  1970/12/15   MRN:  [id number]  PCP:  Allean FoundSMITH,CANDACE THIELE, MD    Chief Complaint: Vaginal Irritation; Cough; and Scratchy Throat   History of Present Illness:  This is a 44 y.o. female with PMH RLS, headaches, HTN who is presenting with complaint of vaginal odor and uri symptoms.  Complaining of vaginal odor x 2 weeks. Started after she got off her period. LMP 05/06/15. Irritation/odor started 22nd. She tried desitin cream, which she states her obgyn recommended in the past but didn't help. She denies vaginal discharge but does note that her urine has looked more cloudy. She is having mild intermittent vaginal itching. Intermittent burning with urination. She states she has problems with recurrent vaginal odor/bv. She states "even my obgyn can't figure it out". She finds the odor very distressing. She is not currently sexually active but she states she "wants to have a sex-life and this doesn't help". She uses unscented pads during period, not tampons. She uses unscented body soap and does not put soap in vagina. She does not douche. She denies abdominal pain, n/v, back pain, fever, chills. Pt has had genital warts in the past that were removed at obgyn. She wants me to make sure there is no evidence of warts currently.  Pt also endorsing 2 days of cough and nasal congestion. She is having an itchy throat that is making it hard for her to sleep. She is wanting tessalon and tussionex which have worked well in the past for her. She denies otalgia, fever, chills, sob/wheezing.  Review of Systems:  Review of Systems See HPI  Patient Active Problem List   Diagnosis Date Noted  . Frequent headaches 03/17/2015  . RLS (restless legs syndrome) 03/17/2015  . HTN (hypertension) 05/19/2013  . Elevated white blood cell count 09/25/2011    Prior to Admission  medications   Medication Sig Start Date End Date Taking? Authorizing Provider  buPROPion (WELLBUTRIN XL) 300 MG 24 hr tablet TK 1 T PO QAM 03/10/15  Yes Historical Provider, MD  clonazePAM (KLONOPIN) 1 MG tablet TK 1 T PO ONCE D PRA 01/02/15  Yes Historical Provider, MD  escitalopram (LEXAPRO) 10 MG tablet TK 1 T PO QD 01/02/15  Yes Historical Provider, MD  lamoTRIgine (LAMICTAL) 200 MG tablet TK 1 T PO QD 01/02/15  Yes Historical Provider, MD  lisinopril-hydrochlorothiazide (PRINZIDE,ZESTORETIC) 10-12.5 MG per tablet Take 1 tablet by mouth daily. 02/13/15  Yes Elvina SidleKurt Lauenstein, MD  naratriptan (AMERGE) 2.5 MG tablet Take 1 tablet (2.5 mg total) by mouth as needed for migraine. Take one (1) tablet at onset of headache; if returns or does not resolve, may repeat after 4 hours; do not exceed five (5) mg in 24 hours. 02/13/15  Yes Elvina SidleKurt Lauenstein, MD  Oxycodone HCl 10 MG TABS TK 1 T PO Q 4 TO 6 H PRN P 02/06/15  Yes Historical Provider, MD  pramipexole (MIRAPEX) 0.125 MG tablet 1 pill each night x 1 week, then 2 pills each night x 1 week, then 3 pills each night thereafter. Take 90-120 minutes before bedtime. 03/06/15  Yes Huston FoleySaima Athar, MD  ranitidine (ZANTAC) 150 MG tablet  02/08/15  Yes Historical Provider, MD  topiramate (TOPAMAX) 50 MG tablet Take 1 tablet (50 mg total) by mouth at bedtime. Patient not taking: Reported on 05/22/2015 02/13/15   Elvina SidleKurt Lauenstein,  MD    Allergies  Allergen Reactions  . Bee Venom Hives and Swelling  . Enablex [Darifenacin Hydrobromide] Other (See Comments)    Phlebitis.  . Erythromycin Hives  . Macrobid [Nitrofurantoin Monohyd Macro] Nausea And Vomiting  . Nitrofuran Derivatives Nausea And Vomiting    Past Surgical History  Procedure Laterality Date  . Cholecystectomy    . Ectopic pregnancy surgery    . Wisdom tooth extraction    . Dilation and curettage of uterus  1993  . Essure tubal ligation      Social History  Substance Use Topics  . Smoking status: Never  Smoker   . Smokeless tobacco: Never Used  . Alcohol Use: No    Family History  Problem Relation Age of Onset  . Hypertension Mother   . Heart failure Mother   . Cancer Mother   . Heart disease Mother   . Heart disease Brother   . Hypertension Brother   . Cancer Father     Leukemia  . Hypertension Father   . Cancer Sister     Medication list has been reviewed and updated.  Physical Examination:  Physical Exam  Constitutional: She is oriented to person, place, and time. She appears well-developed and well-nourished. No distress.  HENT:  Head: Normocephalic and atraumatic.  Right Ear: Hearing, external ear and ear canal normal. Tympanic membrane is retracted.  Left Ear: Hearing, external ear and ear canal normal. Tympanic membrane is retracted.  Nose: Nose normal.  Mouth/Throat: Uvula is midline, oropharynx is clear and moist and mucous membranes are normal.  Eyes: Conjunctivae and lids are normal. Right eye exhibits no discharge. Left eye exhibits no discharge. No scleral icterus.  Cardiovascular: Normal rate, regular rhythm, normal heart sounds and normal pulses.   No murmur heard. Pulmonary/Chest: Effort normal and breath sounds normal. No respiratory distress. She has no wheezes. She has no rhonchi. She has no rales.  Abdominal: Soft. Normal appearance. There is no tenderness.  Genitourinary: Vagina normal and uterus normal. There is no lesion on the right labia. There is no lesion on the left labia. Cervix exhibits no motion tenderness and no discharge. Right adnexum displays no tenderness and no fullness. No vaginal discharge found.  No verrucous lesions No significant vaginal discharge and no apparent odor  Musculoskeletal: Normal range of motion.  Lymphadenopathy:       Head (right side): No submental, no submandibular and no tonsillar adenopathy present.       Head (left side): No submental, no submandibular and no tonsillar adenopathy present.    She has no cervical  adenopathy.  Neurological: She is alert and oriented to person, place, and time.  Skin: Skin is warm, dry and intact. No lesion and no rash noted.  Psychiatric: She has a normal mood and affect. Her speech is normal and behavior is normal. Thought content normal.   BP 104/70 mmHg  Pulse 99  Temp(Src) 98.9 F (37.2 C) (Oral)  Resp 16  Ht 4' 11.5" (1.511 m)  Wt 192 lb 4 oz (87.204 kg)  BMI 38.20 kg/m2  SpO2 98%  LMP 05/06/2015  Results for orders placed or performed in visit on 05/22/15  POCT Wet + KOH Prep  Result Value Ref Range   Yeast by KOH Absent Present, Absent   Yeast by wet prep Absent Present, Absent   WBC by wet prep Few None, Few, Too numerous to count   Clue Cells Wet Prep HPF POC None None, Too numerous to count  Trich by wet prep Absent Present, Absent   Bacteria Wet Prep HPF POC Few None, Few, Too numerous to count   Epithelial Cells By [employer] Pref ([facility]) Moderate (A) None, Few, Too numerous to count   RBC,UR,HPF,POC None None RBC/hpf  POCT urinalysis dipstick  Result Value Ref Range   Color, UA yellow yellow   Clarity, UA hazy (A) clear   Glucose, UA negative negative   Bilirubin, UA negative negative   Ketones, POC UA negative negative   Spec Grav, UA 1.025    Blood, UA negative negative   pH, UA 6.0    Protein Ur, POC =30 (A) negative   Urobilinogen, UA 0.2    Nitrite, UA Negative Negative   Leukocytes, UA small (1+) (A) Negative    Assessment and Plan:  1. Vaginal odor No apparent reason for vaginal odor. Wet prep negative. UA with 1+ leuks but otherwise negative. Pt has had problems with recurrent bv in the past, suspect she is hyper-aware of odor. We discussed proper hygiene and skin products. Also suggested hypoallergenic pads to use since vaginal irritation/odor occurs after period. F/u here or with gyn if symptoms still bothersome in 1-2 weeks. - POCT Wet + KOH Prep - POCT urinalysis dipstick - GC/Chlamydia Probe Amp; Future  2. Cough 3. Sore  throat Likely viral uri, focus is on supportive care, see meds rx'd below. - chlorpheniramine-HYDROcodone (TUSSIONEX PENNKINETIC ER) 10-8 MG/5ML SUER; Take 5 mLs by mouth every 12 (twelve) hours as needed for cough.  Dispense: 100 mL; Refill: 0 - benzonatate (TESSALON) 100 MG capsule; Take 1-2 capsules (100-200 mg total) by mouth 3 (three) times daily as needed for cough.  Dispense: 40 capsule; Refill: 0 - magic mouthwash w/lidocaine SOLN; Take 10 mLs by mouth every 2 (two) hours as needed for mouth pain.  Dispense: 360 mL; Refill: 0   Nysa Sarin V. Dyke Brackett, MHS [facility]  05/28/2015

## 2015-05-22 NOTE — Patient Instructions (Signed)
Tessalon during the day Cough syrup at night Mouthwash every 2 hours as needed - gargle but do not swallow  7th generation pads Hypoallergenic condoms.

## 2015-05-31 ENCOUNTER — Telehealth: Payer: Self-pay

## 2015-05-31 NOTE — Telephone Encounter (Signed)
I left a message letting the patient know that I am cancelling appt on 12/27 because Dr. Frances FurbishAthar will be out of the office that day. I asked her to call back and get rescheduled.

## 2015-06-07 ENCOUNTER — Ambulatory Visit (INDEPENDENT_AMBULATORY_CARE_PROVIDER_SITE_OTHER): Payer: 59 | Admitting: Family Medicine

## 2015-06-07 VITALS — BP 120/80 | HR 98 | Temp 97.4°F | Resp 18 | Ht 60.0 in | Wt 192.6 lb

## 2015-06-07 DIAGNOSIS — N898 Other specified noninflammatory disorders of vagina: Secondary | ICD-10-CM

## 2015-06-07 DIAGNOSIS — R319 Hematuria, unspecified: Secondary | ICD-10-CM | POA: Diagnosis not present

## 2015-06-07 DIAGNOSIS — N926 Irregular menstruation, unspecified: Secondary | ICD-10-CM

## 2015-06-07 DIAGNOSIS — N200 Calculus of kidney: Secondary | ICD-10-CM

## 2015-06-07 LAB — POC MICROSCOPIC URINALYSIS (UMFC): MUCUS RE: ABSENT

## 2015-06-07 LAB — POCT URINALYSIS DIP (MANUAL ENTRY)
BILIRUBIN UA: NEGATIVE
GLUCOSE UA: NEGATIVE
NITRITE UA: NEGATIVE
Protein Ur, POC: NEGATIVE
Spec Grav, UA: 1.025
Urobilinogen, UA: 0.2
pH, UA: 6

## 2015-06-07 LAB — POCT WET + KOH PREP
Trich by wet prep: ABSENT
YEAST BY KOH: ABSENT
Yeast by wet prep: ABSENT

## 2015-06-07 LAB — POCT URINE PREGNANCY: Preg Test, Ur: NEGATIVE

## 2015-06-07 MED ORDER — TERCONAZOLE 0.4 % VA CREA: 1.0000 | TOPICAL_CREAM | Freq: Two times a day (BID) | VAGINAL | Status: DC

## 2015-06-07 NOTE — Progress Notes (Signed)
Subjective:    Patient ID: Helen Greene, female    DOB: 1970/12/29, 44 y.o.   MRN: [id number]  06/07/2015  Other   HPI This 44 y.o. female presents for evaluation of vaginal irritation. Still feels like has vaginal irritation.  Cannot do anything without triggering symptoms.  This time, no sexual activity; this time had menses.  Just evaluated by Lanier Clam, PA-C on 05/22/15.  Wet prep negative at that time.  Then got really irritated after visit. Used OTC yeast medication with improvement.  Repeated yeast medication last week.   Last week felt a little itchy.  Had remaining Metronidazole suppository that caused horrible burning.  Drinking more tea and soda; known kidney stones.   No previous trial of boric acid suppositories.  Was sexual active two weeks ago new partner.  Had Amoxicillin left over and took it; that caused yeast infection.  Last full STD screening 08/2014.    Review of Systems  Constitutional: Negative for fever, chills, diaphoresis and fatigue.  Gastrointestinal: Negative for nausea, vomiting and abdominal pain.  Genitourinary: Positive for hematuria, vaginal discharge, vaginal pain and menstrual problem. Negative for dysuria, urgency, frequency, flank pain, decreased urine volume, vaginal bleeding, genital sores and pelvic pain.    Past Medical History  Diagnosis Date  . Elevated white blood cell count   . Anxiety   . GERD (gastroesophageal reflux disease)   . Elevated white blood cell count 09/25/2011  . Kidney stone   . Allergy   . Depression   . Complication of anesthesia   . PONV (postoperative nausea and vomiting)   . Sleep apnea     does not have CPAP machine  . Shortness of breath dyspnea     with exertion  . Neuromuscular disorder (HCC)     carpel tunnel disease   Past Surgical History  Procedure Laterality Date  . Cholecystectomy    . Ectopic pregnancy surgery    . Wisdom tooth extraction    . Dilation and curettage of uterus  1993  . Essure  tubal ligation     Allergies  Allergen Reactions  . Bee Venom Hives and Swelling  . Enablex [Darifenacin Hydrobromide] Other (See Comments)    Phlebitis.  . Erythromycin Hives  . Macrobid [Nitrofurantoin Monohyd Macro] Nausea And Vomiting  . Nitrofuran Derivatives Nausea And Vomiting    Social History   Social History  . Marital Status: Divorced    Spouse Name: N/A  . Number of Children: N/A  . Years of Education: N/A   Occupational History  . Not on file.   Social History Main Topics  . Smoking status: Never Smoker   . Smokeless tobacco: Never Used  . Alcohol Use: No  . Drug Use: No  . Sexual Activity: Yes   Other Topics Concern  . Not on file   Social History Narrative   Family History  Problem Relation Age of Onset  . Hypertension Mother   . Heart failure Mother   . Cancer Mother   . Heart disease Mother   . Heart disease Brother   . Hypertension Brother   . Cancer Father     Leukemia  . Hypertension Father   . Cancer Sister        Objective:    BP 120/80 mmHg  Pulse 98  Temp(Src) 97.4 F (36.3 C) (Oral)  Resp 18  Ht 5' (1.524 m)  Wt 192 lb 9.6 oz (87.363 kg)  BMI 37.61 kg/m2  SpO2 97%  LMP 05/06/2015 Physical Exam  Constitutional: She is oriented to person, place, and time. She appears well-developed and well-nourished. No distress.  HENT:  Head: Normocephalic and atraumatic.  Eyes: Conjunctivae are normal. Pupils are equal, round, and reactive to light.  Neck: Normal range of motion. Neck supple.  Cardiovascular: Normal rate, regular rhythm and normal heart sounds.  Exam reveals no gallop and no friction rub.   No murmur heard. Pulmonary/Chest: Effort normal and breath sounds normal. She has no wheezes. She has no rales.  Abdominal: Soft. Bowel sounds are normal. She exhibits no distension and no mass. There is no tenderness. There is no rebound and no guarding.  Genitourinary: Vagina normal and uterus normal. There is no rash, tenderness  or lesion on the right labia. There is no rash, tenderness or lesion on the left labia. Cervix exhibits no motion tenderness, no discharge and no friability. Right adnexum displays no mass, no tenderness and no fullness. Left adnexum displays no mass, no tenderness and no fullness. No erythema, tenderness or bleeding in the vagina. No foreign body around the vagina. No vaginal discharge found.  Erythema and irritation to labia B.  Neurological: She is alert and oriented to person, place, and time.  Skin: She is not diaphoretic.  Psychiatric: She has a normal mood and affect. Her behavior is normal.  Nursing note and vitals reviewed.       Assessment & Plan:   1. Vaginal irritation   2. Hematuria   3. Nephrolithiasis   4. Irregular menses     Orders Placed This Encounter  Procedures  . GC/Chlamydia Probe Amp  . POCT urinalysis dipstick  . POCT urine pregnancy  . POCT Microscopic Urinalysis ([facility])  . POCT Wet + KOH Prep   Meds ordered this encounter  Medications  . terconazole (TERAZOL 7) 0.4 % vaginal cream    Sig: Place 1 applicator vaginally 2 (two) times daily.    Dispense:  45 g    Refill:  0    Return if symptoms worsen or fail to improve.    Wendel Homeyer Paulita FujitaMartin Savan Ruta, M.D. [facility]  [place] [address] [place], [place]  [address] [phone] phone [phone] fax

## 2015-06-07 NOTE — Patient Instructions (Signed)
1.  Recommend probiotic; [employer] colon health which is a probiotic.

## 2015-06-09 LAB — GC/CHLAMYDIA PROBE AMP
CT Probe RNA: NOT DETECTED
GC PROBE AMP APTIMA: NOT DETECTED

## 2015-06-13 ENCOUNTER — Ambulatory Visit: Payer: 59 | Admitting: Neurology

## 2015-06-28 ENCOUNTER — Telehealth: Payer: Self-pay

## 2015-06-28 NOTE — Telephone Encounter (Signed)
Pt requesting meds for yeast infection,   Best phone [phone]   Pharmacy [facility]

## 2015-06-29 NOTE — Telephone Encounter (Signed)
Spoke with pt, she wants Dr. Katrinka BlazingSmith to advise on Rx for yeast.

## 2015-06-29 NOTE — Telephone Encounter (Signed)
It is fine to refer her to a gyn, however she states that she has been discussing this with her OBGYN--see 05/22/15 note.  Helen Greene states that she could refer in the note if sxs do not resolve.  If she would like a 2nd opinion, then it is fine to go ahead and refer her to gynecology--it is also fine if she want to come in to discuss the referral, however we can go ahead and do this.  She would need to come in for yeast medication--recurrent symptoms--unsure this is even yeast.

## 2015-06-29 NOTE — Telephone Encounter (Signed)
Pt has been seen multiple times for vaginal odor/discharge/irriation. Each time pt comes in her wet prep does not usually show yeast or BV. Should pt come in to discuss possible GYN referral to evaluate why she has regular vaginal irritation? Or we able to send in something?

## 2015-07-02 NOTE — Telephone Encounter (Signed)
I prescribed the Terazol vaginal suppository at her recent visit on 06/07/15.  Did it help or have no effect?

## 2015-07-03 NOTE — Telephone Encounter (Signed)
Called pt, unable to leave message.

## 2015-07-09 ENCOUNTER — Ambulatory Visit (INDEPENDENT_AMBULATORY_CARE_PROVIDER_SITE_OTHER): Payer: 59 | Admitting: Family Medicine

## 2015-07-09 VITALS — BP 138/82 | HR 105 | Temp 98.6°F | Resp 18 | Ht 60.5 in | Wt 198.0 lb

## 2015-07-09 DIAGNOSIS — N898 Other specified noninflammatory disorders of vagina: Secondary | ICD-10-CM

## 2015-07-09 LAB — POCT WET + KOH PREP
TRICH BY WET PREP: ABSENT
YEAST BY KOH: ABSENT
YEAST BY WET PREP: ABSENT

## 2015-07-09 NOTE — Patient Instructions (Signed)
I do not see any sign of yeast infection or bacterial vaginosis on your testing today. There were some white blood cells and a few bacteria, so will let you know when we have the results of the sexual transmitted infection testing. If you have any abdominal pain or pelvic pain in the meantime, return as may need to start antibiotics. For the external irritation, you can apply Desitin ointment over-the-counter, and can try sitting in a bathtub with a few inches of warm water mixed with one half cup of baking soda up once per day or every other day. Follow-up with your OB/GYN to further evaluate these recurrent symptoms.  Return to the clinic or go to the nearest emergency room if any of your symptoms worsen or new symptoms occur.  Vaginitis Vaginitis is an inflammation of the vagina. It is most often caused by a change in the normal balance of the bacteria and yeast that live in the vagina. This change in balance causes an overgrowth of certain bacteria or yeast, which causes the inflammation. There are different types of vaginitis, but the most common types are:  Bacterial vaginosis.  Yeast infection (candidiasis).  Trichomoniasis vaginitis. This is a sexually transmitted infection (STI).  Viral vaginitis.  Atrophic vaginitis.  Allergic vaginitis. CAUSES  The cause depends on the type of vaginitis. Vaginitis can be caused by:  Bacteria (bacterial vaginosis).  Yeast (yeast infection).  A parasite (trichomoniasis vaginitis)  A virus (viral vaginitis).  Low hormone levels (atrophic vaginitis). Low hormone levels can occur during pregnancy, breastfeeding, or after menopause.  Irritants, such as bubble baths, scented tampons, and feminine sprays (allergic vaginitis). Other factors can change the normal balance of the yeast and bacteria that live in the vagina. These include:  Antibiotic medicines.  Poor hygiene.  Diaphragms, vaginal sponges, spermicides, birth control pills, and  intrauterine devices (IUD).  Sexual intercourse.  Infection.  Uncontrolled diabetes.  A weakened immune system. SYMPTOMS  Symptoms can vary depending on the cause of the vaginitis. Common symptoms include:  Abnormal vaginal discharge.  The discharge is white, gray, or yellow with bacterial vaginosis.  The discharge is thick, white, and cheesy with a yeast infection.  The discharge is frothy and yellow or greenish with trichomoniasis.  A bad vaginal odor.  The odor is fishy with bacterial vaginosis.  Vaginal itching, pain, or swelling.  Painful intercourse.  Pain or burning when urinating. Sometimes, there are no symptoms. TREATMENT  Treatment will vary depending on the type of infection.   Bacterial vaginosis and trichomoniasis are often treated with antibiotic creams or pills.  Yeast infections are often treated with antifungal medicines, such as vaginal creams or suppositories.  Viral vaginitis has no cure, but symptoms can be treated with medicines that relieve discomfort. Your sexual partner should be treated as well.  Atrophic vaginitis may be treated with an estrogen cream, pill, suppository, or vaginal ring. If vaginal dryness occurs, lubricants and moisturizing creams may help. You may be told to avoid scented soaps, sprays, or douches.  Allergic vaginitis treatment involves quitting the use of the product that is causing the problem. Vaginal creams can be used to treat the symptoms. HOME CARE INSTRUCTIONS   Take all medicines as directed by your caregiver.  Keep your genital area clean and dry. Avoid soap and only rinse the area with water.  Avoid douching. It can remove the healthy bacteria in the vagina.  Do not use tampons or have sexual intercourse until your vaginitis has been treated.  Use sanitary pads while you have vaginitis.  Wipe from front to back. This avoids the spread of bacteria from the rectum to the vagina.  Let air reach your genital  area.  Wear cotton underwear to decrease moisture buildup.  Avoid wearing underwear while you sleep until your vaginitis is gone.  Avoid tight pants and underwear or nylons without a cotton panel.  Take off wet clothing (especially bathing suits) as soon as possible.  Use mild, non-scented products. Avoid using irritants, such as:  Scented feminine sprays.  Fabric softeners.  Scented detergents.  Scented tampons.  Scented soaps or bubble baths.  Practice safe sex and use condoms. Condoms may prevent the spread of trichomoniasis and viral vaginitis. SEEK MEDICAL CARE IF:   You have abdominal pain.  You have a fever or persistent symptoms for more than 2-3 days.  You have a fever and your symptoms suddenly get worse.   This information is not intended to replace advice given to you by your health care provider. Make sure you discuss any questions you have with your health care provider.   Document Released: 03/31/2007 Document Revised: 10/18/2014 Document Reviewed: 11/14/2011 Elsevier Interactive Patient Education [employer]

## 2015-07-09 NOTE — Progress Notes (Addendum)
Subjective:  This chart was scribed for [name], MD by [facility], medical scribe at [facility].The patient was seen in exam room 01 and the patient's care was started at 12:19 PM.   Patient ID: Helen Greene, female    DOB: [birth year], 45 y.o.   MRN: [id number] Chief Complaint  Patient presents with  . Vaginal Itching    since yesterday   HPI HPI Comments: Helen Greene is a 45 y.o. female who presents to [facility] due to vaginal itching and discharge which began yesterday. She was seen one month ago for vaginal irritation, treated with terconazole cream twice per day, recommended a probiotic. We Prep Dec 14 th showed no yeast at that time. Wet prep June of last year, no yeast at that time. She is having recurrent vaginal itching and discharge. A history of bacterial vaginosis, typically occurs after sexual intercourse. No new sexual partners, most recent partner of 3 month, no hx of STI. The cream given to her by Dr. Katrinka Blazing was somewhat helpful. She uses Dove unscented to clean, wears cotton underwear, no recent antibiotics.  Patient Active Problem List   Diagnosis Date Noted  . Frequent headaches 03/17/2015  . RLS (restless legs syndrome) 03/17/2015  . HTN (hypertension) 05/19/2013  . Elevated white blood cell count 09/25/2011   Past Medical History  Diagnosis Date  . Elevated white blood cell count   . Anxiety   . GERD (gastroesophageal reflux disease)   . Elevated white blood cell count 09/25/2011  . Kidney stone   . Allergy   . Depression   . Complication of anesthesia   . PONV (postoperative nausea and vomiting)   . Sleep apnea     does not have CPAP machine  . Shortness of breath dyspnea     with exertion  . Neuromuscular disorder (HCC)     carpel tunnel disease   Past Surgical History  Procedure Laterality Date  . Cholecystectomy    . Ectopic pregnancy surgery    . Wisdom tooth extraction    . Dilation and  curettage of uterus  1993  . Essure tubal ligation     Allergies  Allergen Reactions  . Bee Venom Hives and Swelling  . Enablex [Darifenacin Hydrobromide] Other (See Comments)    Phlebitis.  . Erythromycin Hives  . Macrobid [Nitrofurantoin Monohyd Macro] Nausea And Vomiting  . Nitrofuran Derivatives Nausea And Vomiting   Prior to Admission medications   Medication Sig Start Date End Date Taking? Authorizing Provider  buPROPion (WELLBUTRIN XL) 300 MG 24 hr tablet Reported on 06/07/2015 03/10/15  Yes Historical Provider, MD  clonazePAM (KLONOPIN) 1 MG tablet TK 1 T PO ONCE D PRA 01/02/15  Yes Historical Provider, MD  escitalopram (LEXAPRO) 10 MG tablet TK 1 T PO QD 01/02/15  Yes Historical Provider, MD  lamoTRIgine (LAMICTAL) 200 MG tablet TK 1 T PO QD 01/02/15  Yes Historical Provider, MD  lisinopril-hydrochlorothiazide (PRINZIDE,ZESTORETIC) 10-12.5 MG per tablet Take 1 tablet by mouth daily. 02/13/15  Yes Elvina Sidle, MD  naratriptan (AMERGE) 2.5 MG tablet Take 1 tablet (2.5 mg total) by mouth as needed for migraine. Take one (1) tablet at onset of headache; if returns or does not resolve, may repeat after 4 hours; do not exceed five (5) mg in 24 hours. 02/13/15  Yes Elvina Sidle, MD  Oxycodone HCl 10 MG TABS TK 1 T PO Q 4 TO 6 H PRN P 02/06/15  Yes Historical Provider, MD  pramipexole (MIRAPEX) 0.125 MG tablet 1 pill each night x 1 week, then 2 pills each night x 1 week, then 3 pills each night thereafter. Take 90-120 minutes before bedtime. 03/06/15  Yes [name], MD  terconazole (TERAZOL 7) 0.4 % vaginal cream Place 1 applicator vaginally 2 (two) times daily. 06/07/15  Yes Ethelda Chick, MD  ranitidine (ZANTAC) 150 MG tablet Reported on 07/09/2015 02/08/15   Historical Provider, MD  topiramate (TOPAMAX) 50 MG tablet Take 1 tablet (50 mg total) by mouth at bedtime. Patient not taking: Reported on 07/09/2015 02/13/15   Elvina Sidle, MD   Social History   Social History  . Marital  Status: Divorced    Spouse Name: N/A  . Number of Children: N/A  . Years of Education: N/A   Occupational History  . Not on file.   Social History Main Topics  . Smoking status: Never Smoker   . Smokeless tobacco: Never Used  . Alcohol Use: No  . Drug Use: No  . Sexual Activity: Yes   Other Topics Concern  . Not on file   Social History Narrative   Review of Systems  Genitourinary: Positive for vaginal discharge.      Objective:  BP 138/82 mmHg  Pulse 105  Temp(Src) 98.6 F (37 C)  Resp 18  Ht 5' 0.5" (1.537 m)  Wt 198 lb (89.812 kg)  BMI 38.02 kg/m2  SpO2 97%  LMP 06/21/2015 (Approximate) Physical Exam  Constitutional: She is oriented to person, place, and time. She appears well-developed and well-nourished. No distress.  HENT:  Head: Normocephalic and atraumatic.  Eyes: Pupils are equal, round, and reactive to light.  Neck: Normal range of motion.  Cardiovascular: Normal rate and regular rhythm.   Pulmonary/Chest: Effort normal. No respiratory distress.  Genitourinary: There is no rash, tenderness, lesion or injury on the right labia. There is no rash, tenderness, lesion or injury on the left labia. Uterus is not tender. Cervix exhibits discharge. Cervix exhibits no motion tenderness. No bleeding in the vagina. No foreign body around the vagina. Vaginal discharge (Small amount of clear to slightly yellow discharge noted at cervical os.) found.  Musculoskeletal: Normal range of motion.  Neurological: She is alert and oriented to person, place, and time.  Skin: Skin is warm and dry.  Psychiatric: She has a normal mood and affect. Her behavior is normal.  Nursing note and vitals reviewed.  Results for orders placed or performed in visit on 07/09/15  POCT Wet + KOH Prep  Result Value Ref Range   Yeast by KOH Absent Present, Absent   Yeast by wet prep Absent Present, Absent   WBC by wet prep Many (A) None, Few, Too numerous to count   Clue Cells Wet Prep HPF POC  None None, Too numerous to count   Trich by wet prep Absent Present, Absent   Bacteria Wet Prep HPF POC Moderate (A) None, Few, Too numerous to count   Epithelial Cells By [employer] ([facility]) Moderate (A) None, Few, Too numerous to count   RBC,UR,HPF,POC None None RBC/hpf      Assessment & Plan:  Helen Greene is a 45 y.o. female Vaginal discharge - Plan: POCT Wet + KOH Prep, GC/Chlamydia Probe Amp Small amount of discharge noticed at cervical os, but otherwise normal-appearing exam. There is no CMT or adnexal tenderness, so less likely PID. Decided against treatment with antibiotics until cultures returned (Gen-Probe was obtained today). Possible external irritation/vaginitis  without signs of BV or candida at this time.   -Can try over-the-counter Desitin for barrier cream  -trial of warm water soak with half cup of baking soda QOD  to see if this provides some relief  -follow-up with her OB/GYN as planned to look into other causes or treatments.   -RTC precautions if increasing symptoms or any pelvic or abdominal pain.   No orders of the defined types were placed in this encounter.   Patient Instructions  I do not see any sign of yeast infection or bacterial vaginosis on your testing today. There were some white blood cells and a few bacteria, so will let you know when we have the results of the sexual transmitted infection testing. If you have any abdominal pain or pelvic pain in the meantime, return as may need to start antibiotics. For the external irritation, you can apply Desitin ointment over-the-counter, and can try sitting in a bathtub with a few inches of warm water mixed with one half cup of baking soda up once per day or every other day. Follow-up with your OB/GYN to further evaluate these recurrent symptoms.  Return to the clinic or go to the nearest emergency room if any of your symptoms worsen or new symptoms occur.  Vaginitis Vaginitis is an inflammation of the vagina. It is most  often caused by a change in the normal balance of the bacteria and yeast that live in the vagina. This change in balance causes an overgrowth of certain bacteria or yeast, which causes the inflammation. There are different types of vaginitis, but the most common types are:  Bacterial vaginosis.  Yeast infection (candidiasis).  Trichomoniasis vaginitis. This is a sexually transmitted infection (STI).  Viral vaginitis.  Atrophic vaginitis.  Allergic vaginitis. CAUSES  The cause depends on the type of vaginitis. Vaginitis can be caused by:  Bacteria (bacterial vaginosis).  Yeast (yeast infection).  A parasite (trichomoniasis vaginitis)  A virus (viral vaginitis).  Low hormone levels (atrophic vaginitis). Low hormone levels can occur during pregnancy, breastfeeding, or after menopause.  Irritants, such as bubble baths, scented tampons, and feminine sprays (allergic vaginitis). Other factors can change the normal balance of the yeast and bacteria that live in the vagina. These include:  Antibiotic medicines.  Poor hygiene.  Diaphragms, vaginal sponges, spermicides, birth control pills, and intrauterine devices (IUD).  Sexual intercourse.  Infection.  Uncontrolled diabetes.  A weakened immune system. SYMPTOMS  Symptoms can vary depending on the cause of the vaginitis. Common symptoms include:  Abnormal vaginal discharge.  The discharge is white, gray, or yellow with bacterial vaginosis.  The discharge is thick, white, and cheesy with a yeast infection.  The discharge is frothy and yellow or greenish with trichomoniasis.  A bad vaginal odor.  The odor is fishy with bacterial vaginosis.  Vaginal itching, pain, or swelling.  Painful intercourse.  Pain or burning when urinating. Sometimes, there are no symptoms. TREATMENT  Treatment will vary depending on the type of infection.   Bacterial vaginosis and trichomoniasis are often treated with antibiotic creams or  pills.  Yeast infections are often treated with antifungal medicines, such as vaginal creams or suppositories.  Viral vaginitis has no cure, but symptoms can be treated with medicines that relieve discomfort. Your sexual partner should be treated as well.  Atrophic vaginitis may be treated with an estrogen cream, pill, suppository, or vaginal ring. If vaginal dryness occurs, lubricants and moisturizing creams may help. You may be told to avoid scented soaps,  sprays, or douches.  Allergic vaginitis treatment involves quitting the use of the product that is causing the problem. Vaginal creams can be used to treat the symptoms. HOME CARE INSTRUCTIONS   Take all medicines as directed by your caregiver.  Keep your genital area clean and dry. Avoid soap and only rinse the area with water.  Avoid douching. It can remove the healthy bacteria in the vagina.  Do not use tampons or have sexual intercourse until your vaginitis has been treated. Use sanitary pads while you have vaginitis.  Wipe from front to back. This avoids the spread of bacteria from the rectum to the vagina.  Let air reach your genital area.  Wear cotton underwear to decrease moisture buildup.  Avoid wearing underwear while you sleep until your vaginitis is gone.  Avoid tight pants and underwear or nylons without a cotton panel.  Take off wet clothing (especially bathing suits) as soon as possible.  Use mild, non-scented products. Avoid using irritants, such as:  Scented feminine sprays.  Fabric softeners.  Scented detergents.  Scented tampons.  Scented soaps or bubble baths.  Practice safe sex and use condoms. Condoms may prevent the spread of trichomoniasis and viral vaginitis. SEEK MEDICAL CARE IF:   You have abdominal pain.  You have a fever or persistent symptoms for more than 2-3 days.  You have a fever and your symptoms suddenly get worse.   This information is not intended to replace advice given to  you by your health care provider. Make sure you discuss any questions you have with your health care provider.   Document Released: 03/31/2007 Document Revised: 10/18/2014 Document Reviewed: 11/14/2011 Elsevier Interactive Patient Education [employer]       By signing my name below, I, Nadim Abuhashem, attest that this documentation has been prepared under the direction and in the presence of [name], MD.  Electronically Signed: Conchita Paris, medical scribe. 07/09/2015, 12:28 PM.

## 2015-07-10 LAB — GC/CHLAMYDIA PROBE AMP
CT Probe RNA: NOT DETECTED
GC PROBE AMP APTIMA: NOT DETECTED

## 2015-07-30 ENCOUNTER — Ambulatory Visit (INDEPENDENT_AMBULATORY_CARE_PROVIDER_SITE_OTHER): Payer: 59 | Admitting: Family Medicine

## 2015-07-30 VITALS — BP 106/72 | HR 81 | Temp 98.5°F | Resp 16 | Ht 60.5 in | Wt 196.0 lb

## 2015-07-30 DIAGNOSIS — J039 Acute tonsillitis, unspecified: Secondary | ICD-10-CM

## 2015-07-30 DIAGNOSIS — R519 Headache, unspecified: Secondary | ICD-10-CM

## 2015-07-30 DIAGNOSIS — R51 Headache: Secondary | ICD-10-CM | POA: Diagnosis not present

## 2015-07-30 DIAGNOSIS — J029 Acute pharyngitis, unspecified: Secondary | ICD-10-CM

## 2015-07-30 LAB — POCT RAPID STREP A (OFFICE): RAPID STREP A SCREEN: NEGATIVE

## 2015-07-30 MED ORDER — AMOXICILLIN 500 MG PO CAPS: 500.0000 mg | ORAL_CAPSULE | Freq: Three times a day (TID) | ORAL | Status: DC

## 2015-07-30 NOTE — Patient Instructions (Signed)
Start amoxicillin for possible strep throat with negative initial test. Sore throat care as discussed. We will check throat culture and let you know when we have the results.  Return to the clinic or go to the nearest emergency room if any of your symptoms worsen or new symptoms occur.  Pharyngitis Pharyngitis is redness, pain, and swelling (inflammation) of your pharynx.  CAUSES  Pharyngitis is usually caused by infection. Most of the time, these infections are from viruses (viral) and are part of a cold. However, sometimes pharyngitis is caused by bacteria (bacterial). Pharyngitis can also be caused by allergies. Viral pharyngitis may be spread from person to person by coughing, sneezing, and personal items or utensils (cups, forks, spoons, toothbrushes). Bacterial pharyngitis may be spread from person to person by more intimate contact, such as kissing.  SIGNS AND SYMPTOMS  Symptoms of pharyngitis include:   Sore throat.   Tiredness (fatigue).   Low-grade fever.   Headache.  Joint pain and muscle aches.  Skin rashes.  Swollen lymph nodes.  Plaque-like film on throat or tonsils (often seen with bacterial pharyngitis). DIAGNOSIS  Your health care provider will ask you questions about your illness and your symptoms. Your medical history, along with a physical exam, is often all that is needed to diagnose pharyngitis. Sometimes, a rapid strep test is done. Other lab tests may also be done, depending on the suspected cause.  TREATMENT  Viral pharyngitis will usually get better in 3-4 days without the use of medicine. Bacterial pharyngitis is treated with medicines that kill germs (antibiotics).  HOME CARE INSTRUCTIONS   Drink enough water and fluids to keep your urine clear or pale yellow.   Only take over-the-counter or prescription medicines as directed by your health care provider:   If you are prescribed antibiotics, make sure you finish them even if you start to feel  better.   Do not take aspirin.   Get lots of rest.   Gargle with 8 oz of salt water ( tsp of salt per 1 qt of water) as often as every 1-2 hours to soothe your throat.   Throat lozenges (if you are not at risk for choking) or sprays may be used to soothe your throat. SEEK MEDICAL CARE IF:   You have large, tender lumps in your neck.  You have a rash.  You cough up green, yellow-brown, or bloody spit. SEEK IMMEDIATE MEDICAL CARE IF:   Your neck becomes stiff.  You drool or are unable to swallow liquids.  You vomit or are unable to keep medicines or liquids down.  You have severe pain that does not go away with the use of recommended medicines.  You have trouble breathing (not caused by a stuffy nose). MAKE SURE YOU:   Understand these instructions.  Will watch your condition.  Will get help right away if you are not doing well or get worse.   This information is not intended to replace advice given to you by your health care provider. Make sure you discuss any questions you have with your health care provider.   Document Released: 06/03/2005 Document Revised: 03/24/2013 Document Reviewed: 02/08/2013 Elsevier Interactive Patient Education [employer]

## 2015-07-30 NOTE — Progress Notes (Addendum)
Subjective:  By signing my name below, I, Helen Greene, attest that this documentation has been prepared under the direction and in the presence of Helen Staggers, MD.  Electronically Signed: Andrew Greene, ED Scribe. 07/30/2015. 4:26 PM.   Patient ID: Helen Greene, female    DOB: [birth year], 45 y.o.   MRN: [id number]  HPI   Chief Complaint  Patient presents with  . Sore Throat  . Sinusitis   HPI Comments: Helen Greene is a 45 y.o. female who presents to the [facility] complaining of swollen tonsils. Pt states she woke up this morning with swollen tonsil. She also noticed a a white patch to right tonsil but reports minimal soreness or pain. She has had a HA for the past week, sinus congestion, popping in ears and gradual onset of body aches. She has been taking tylenol and aleve.  She reports hx of strep. She denies fever and chills.   Patient Active Problem List   Diagnosis Date Noted  . Frequent headaches 03/17/2015  . RLS (restless legs syndrome) 03/17/2015  . HTN (hypertension) 05/19/2013  . Elevated white blood cell count 09/25/2011   Past Medical History  Diagnosis Date  . Elevated white blood cell count   . Anxiety   . GERD (gastroesophageal reflux disease)   . Elevated white blood cell count 09/25/2011  . Kidney stone   . Allergy   . Depression   . Complication of anesthesia   . PONV (postoperative nausea and vomiting)   . Sleep apnea     does not have CPAP machine  . Shortness of breath dyspnea     with exertion  . Neuromuscular disorder (HCC)     carpel tunnel disease   Past Surgical History  Procedure Laterality Date  . Cholecystectomy    . Ectopic pregnancy surgery    . Wisdom tooth extraction    . Dilation and curettage of uterus  1993  . Essure tubal ligation     Allergies  Allergen Reactions  . Bee Venom Hives and Swelling  . Enablex [Darifenacin Hydrobromide] Other (See Comments)    Phlebitis.  . Erythromycin Hives  . Macrobid  [Nitrofurantoin Monohyd Macro] Nausea And Vomiting  . Nitrofuran Derivatives Nausea And Vomiting   Prior to Admission medications   Medication Sig Start Date End Date Taking? Authorizing Provider  buPROPion (WELLBUTRIN XL) 300 MG 24 hr tablet Reported on 06/07/2015 03/10/15  Yes Historical Provider, MD  clonazePAM (KLONOPIN) 1 MG tablet TK 1 T PO ONCE D PRA 01/02/15  Yes Historical Provider, MD  escitalopram (LEXAPRO) 10 MG tablet TK 1 T PO QD 01/02/15  Yes Historical Provider, MD  lisinopril-hydrochlorothiazide (PRINZIDE,ZESTORETIC) 10-12.5 MG per tablet Take 1 tablet by mouth daily. 02/13/15  Yes Elvina Sidle, MD  naratriptan (AMERGE) 2.5 MG tablet Take 1 tablet (2.5 mg total) by mouth as needed for migraine. Take one (1) tablet at onset of headache; if returns or does not resolve, may repeat after 4 hours; do not exceed five (5) mg in 24 hours. 02/13/15  Yes Elvina Sidle, MD  Oxycodone HCl 10 MG TABS TK 1 T PO Q 4 TO 6 H PRN P 02/06/15  Yes Historical Provider, MD  pramipexole (MIRAPEX) 0.125 MG tablet 1 pill each night x 1 week, then 2 pills each night x 1 week, then 3 pills each night thereafter. Take 90-120 minutes before bedtime. 03/06/15  Yes [name], MD  terconazole (TERAZOL 7) 0.4 % vaginal cream  Place 1 applicator vaginally 2 (two) times daily. 06/07/15  Yes Ethelda Chick, MD  topiramate (TOPAMAX) 50 MG tablet Take 1 tablet (50 mg total) by mouth at bedtime. 02/13/15  Yes Elvina Sidle, MD  lamoTRIgine (LAMICTAL) 200 MG tablet Reported on 07/30/2015 01/02/15   Historical Provider, MD  ranitidine (ZANTAC) 150 MG tablet Reported on 07/30/2015 02/08/15   Historical Provider, MD   Social History   Social History  . Marital Status: Divorced    Spouse Name: N/A  . Number of Children: N/A  . Years of Education: N/A   Occupational History  . Not on file.   Social History Main Topics  . Smoking status: Never Smoker   . Smokeless tobacco: Never Used  . Alcohol Use: No  . Drug Use:  No  . Sexual Activity: Yes   Other Topics Concern  . Not on file   Social History Narrative    Review of Systems  Constitutional: Negative for fever and chills.  HENT: Positive for congestion and sore throat ( minimal). Negative for trouble swallowing.   Neurological: Positive for headaches.    Objective:   Physical Exam  Constitutional: She is oriented to person, place, and time. She appears well-developed and well-nourished. No distress.  HENT:  Head: Normocephalic and atraumatic.  Right Ear: Hearing, tympanic membrane, external ear and ear canal normal. Tympanic membrane is not erythematous and not retracted.  Left Ear: Hearing, tympanic membrane, external ear and ear canal normal. Tympanic membrane is not erythematous and not retracted.  Nose: Nose normal.  Mouth/Throat: Oropharynx is clear and moist. No oropharyngeal exudate.  Minimal clear fluid at the base of TM bilaterally. Adherent white exudate with enlargement of right tonsil.   Eyes: Conjunctivae and EOM are normal. Pupils are equal, round, and reactive to light.  Cardiovascular: Normal rate, regular rhythm, normal heart sounds and intact distal pulses.   No murmur heard. Pulmonary/Chest: Effort normal and breath sounds normal. No respiratory distress. She has no wheezes. She has no rhonchi.  Lymphadenopathy:  Slight prominence of right AC node and submandibular.   Neurological: She is alert and oriented to person, place, and time.  Skin: Skin is warm and dry. No rash noted.  Psychiatric: She has a normal mood and affect. Her behavior is normal.  Vitals reviewed.  Filed Vitals:   07/30/15 1554  BP: 106/72  Pulse: 81  Temp: 98.5 F (36.9 C)  TempSrc: Oral  Resp: 16  Height: 5' 0.5" (1.537 m)  Weight: 196 lb (88.905 kg)  SpO2: 98%    Results for orders placed or performed in visit on 07/30/15  POCT rapid strep A  Result Value Ref Range   Rapid Strep A Screen Negative Negative    Assessment & Plan:    [name] is a 45 y.o. female Exudative tonsillitis - Plan: POCT rapid strep A, amoxicillin (AMOXIL) 500 MG capsule  Sore throat - Plan: POCT rapid strep A, Culture, Group A Strep, amoxicillin (AMOXIL) 500 MG capsule  Nonintractable headache, unspecified chronicity pattern, unspecified headache type - Plan: POCT rapid strep A   - exudative tonsillitis with possible false negative strep testing. Start amoxicillin, symptomatic care, check throat culture, RTC precautions.   Meds ordered this encounter  Medications  . amoxicillin (AMOXIL) 500 MG capsule    Sig: Take 1 capsule (500 mg total) by mouth 3 (three) times daily.    Dispense:  30 capsule    Refill:  0   Patient Instructions  Start  amoxicillin for possible strep throat with negative initial test. Sore throat care as discussed. We will check throat culture and let you know when we have the results.  Return to the clinic or go to the nearest emergency room if any of your symptoms worsen or new symptoms occur.  Pharyngitis Pharyngitis is redness, pain, and swelling (inflammation) of your pharynx.  CAUSES  Pharyngitis is usually caused by infection. Most of the time, these infections are from viruses (viral) and are part of a cold. However, sometimes pharyngitis is caused by bacteria (bacterial). Pharyngitis can also be caused by allergies. Viral pharyngitis may be spread from person to person by coughing, sneezing, and personal items or utensils (cups, forks, spoons, toothbrushes). Bacterial pharyngitis may be spread from person to person by more intimate contact, such as kissing.  SIGNS AND SYMPTOMS  Symptoms of pharyngitis include:   Sore throat.   Tiredness (fatigue).   Low-grade fever.   Headache.  Joint pain and muscle aches.  Skin rashes.  Swollen lymph nodes.  Plaque-like film on throat or tonsils (often seen with bacterial pharyngitis). DIAGNOSIS  Your health care provider will ask you questions about  your illness and your symptoms. Your medical history, along with a physical exam, is often all that is needed to diagnose pharyngitis. Sometimes, a rapid strep test is done. Other lab tests may also be done, depending on the suspected cause.  TREATMENT  Viral pharyngitis will usually get better in 3-4 days without the use of medicine. Bacterial pharyngitis is treated with medicines that kill germs (antibiotics).  HOME CARE INSTRUCTIONS   Drink enough water and fluids to keep your urine clear or pale yellow.   Only take over-the-counter or prescription medicines as directed by your health care provider:   If you are prescribed antibiotics, make sure you finish them even if you start to feel better.   Do not take aspirin.   Get lots of rest.   Gargle with 8 oz of salt water ( tsp of salt per 1 qt of water) as often as every 1-2 hours to soothe your throat.   Throat lozenges (if you are not at risk for choking) or sprays may be used to soothe your throat. SEEK MEDICAL CARE IF:   You have large, tender lumps in your neck.  You have a rash.  You cough up green, yellow-brown, or bloody spit. SEEK IMMEDIATE MEDICAL CARE IF:   Your neck becomes stiff.  You drool or are unable to swallow liquids.  You vomit or are unable to keep medicines or liquids down.  You have severe pain that does not go away with the use of recommended medicines.  You have trouble breathing (not caused by a stuffy nose). MAKE SURE YOU:   Understand these instructions.  Will watch your condition.  Will get help right away if you are not doing well or get worse.   This information is not intended to replace advice given to you by your health care provider. Make sure you discuss any questions you have with your health care provider.   Document Released: 06/03/2005 Document Revised: 03/24/2013 Document Reviewed: 02/08/2013 Elsevier Interactive Patient Education [employer]        I  personally performed the services described in this documentation, which was scribed in my presence. The recorded information has been reviewed and considered, and addended by me as needed.

## 2015-08-02 LAB — CULTURE, GROUP A STREP: ORGANISM ID, BACTERIA: NORMAL

## 2015-08-30 ENCOUNTER — Other Ambulatory Visit: Payer: Self-pay | Admitting: Obstetrics

## 2015-09-06 NOTE — Patient Instructions (Addendum)
Your procedure is scheduled on:  Thursday, September 14, 2015  [facility] through [facility] at:  11:30 AM  Pick up the phone at the desk and dial [phone].  Call this number if you have problems the morning of surgery: [phone].  Remember: Do NOT eat food:  After Midnight Thursday, September 13, 2015  Do NOT drink clear liquids after:  9:00 AM day of surgery  Take these medicines the morning of surgery with a SIP OF WATER:  Lisinopril, Wellbutrin  Do NOT wear jewelry (body piercing), metal hair clips/bobby pins, make-up, or nail polish. Do NOT wear lotions, powders, or perfumes.  You may wear deodorant. Do NOT shave for 48 hours prior to surgery. Do NOT bring valuables to the hospital. Contacts, dentures, or bridgework may not be worn into surgery.  Leave suitcase in car.  After surgery it may be brought to your room.  For patients admitted to the hospital, checkout time is 11:00 AM the day of discharge.

## 2015-09-08 ENCOUNTER — Other Ambulatory Visit: Payer: Self-pay

## 2015-09-08 ENCOUNTER — Encounter (HOSPITAL_COMMUNITY)
Admission: RE | Admit: 2015-09-08 | Discharge: 2015-09-08 | Disposition: A | Discharge status: Home / Self Care | Payer: 59 | Source: Ambulatory Visit | Attending: Obstetrics | Admitting: Obstetrics

## 2015-09-08 ENCOUNTER — Encounter (HOSPITAL_COMMUNITY): Payer: Self-pay

## 2015-09-08 DIAGNOSIS — Z01810 Encounter for preprocedural cardiovascular examination: Secondary | ICD-10-CM | POA: Diagnosis present

## 2015-09-08 DIAGNOSIS — Z01812 Encounter for preprocedural laboratory examination: Secondary | ICD-10-CM | POA: Insufficient documentation

## 2015-09-08 HISTORY — DX: Essential (primary) hypertension: I10

## 2015-09-08 HISTORY — DX: Headache: R51

## 2015-09-08 HISTORY — DX: Restless legs syndrome: G25.81

## 2015-09-08 HISTORY — DX: Headache, unspecified: R51.9

## 2015-09-08 LAB — TYPE AND SCREEN
ABO/RH(D): O POS
Antibody Screen: NEGATIVE

## 2015-09-08 LAB — CBC
HEMATOCRIT: 42.8 % (ref 36.0–46.0)
HEMOGLOBIN: 14.5 g/dL (ref 12.0–15.0)
MCH: 30.9 pg (ref 26.0–34.0)
MCHC: 33.9 g/dL (ref 30.0–36.0)
MCV: 91.1 fL (ref 78.0–100.0)
Platelets: 391 10*3/uL (ref 150–400)
RBC: 4.7 MIL/uL (ref 3.87–5.11)
RDW: 13 % (ref 11.5–15.5)
WBC: 12.5 10*3/uL — AB (ref 4.0–10.5)

## 2015-09-08 LAB — BASIC METABOLIC PANEL
Anion gap: 8 (ref 5–15)
BUN: 14 mg/dL (ref 6–20)
CHLORIDE: 104 mmol/L (ref 101–111)
CO2: 26 mmol/L (ref 22–32)
Calcium: 9.1 mg/dL (ref 8.9–10.3)
Creatinine, Ser: 0.9 mg/dL (ref 0.44–1.00)
GFR calc Af Amer: >60 mL/min (ref >60)
GFR calc non Af Amer: >60 mL/min (ref >60)
GLUCOSE: 96 mg/dL (ref 65–99)
POTASSIUM: 3.8 mmol/L (ref 3.5–5.1)
SODIUM: 138 mmol/L (ref 135–145)

## 2015-09-08 LAB — ABO/RH: ABO/RH(D): O POS

## 2015-09-08 NOTE — Pre-Procedure Instructions (Signed)
Dr. Maple HudsonMoser made aware of EKG no new orders received at this time.

## 2015-09-14 ENCOUNTER — Ambulatory Visit (HOSPITAL_COMMUNITY): Payer: 59 | Admitting: Anesthesiology

## 2015-09-14 ENCOUNTER — Encounter (HOSPITAL_COMMUNITY): Payer: Self-pay | Admitting: Anesthesiology

## 2015-09-14 ENCOUNTER — Encounter (HOSPITAL_COMMUNITY)
Admission: RE | Disposition: A | Discharge status: Home / Self Care | Payer: Self-pay | Source: Ambulatory Visit | Attending: Obstetrics

## 2015-09-14 ENCOUNTER — Ambulatory Visit (HOSPITAL_COMMUNITY)
Admission: RE | Admit: 2015-09-14 | Discharge: 2015-09-15 | Disposition: A | Discharge status: Home / Self Care | Payer: 59 | Source: Ambulatory Visit | Attending: Obstetrics | Admitting: Obstetrics

## 2015-09-14 DIAGNOSIS — F329 Major depressive disorder, single episode, unspecified: Secondary | ICD-10-CM | POA: Diagnosis not present

## 2015-09-14 DIAGNOSIS — G4730 Sleep apnea, unspecified: Secondary | ICD-10-CM | POA: Diagnosis not present

## 2015-09-14 DIAGNOSIS — Z87442 Personal history of urinary calculi: Secondary | ICD-10-CM | POA: Diagnosis not present

## 2015-09-14 DIAGNOSIS — G2581 Restless legs syndrome: Secondary | ICD-10-CM | POA: Insufficient documentation

## 2015-09-14 DIAGNOSIS — K219 Gastro-esophageal reflux disease without esophagitis: Secondary | ICD-10-CM | POA: Diagnosis not present

## 2015-09-14 DIAGNOSIS — Z90710 Acquired absence of both cervix and uterus: Secondary | ICD-10-CM | POA: Diagnosis present

## 2015-09-14 DIAGNOSIS — G8929 Other chronic pain: Secondary | ICD-10-CM | POA: Diagnosis not present

## 2015-09-14 DIAGNOSIS — I10 Essential (primary) hypertension: Secondary | ICD-10-CM | POA: Insufficient documentation

## 2015-09-14 DIAGNOSIS — G5600 Carpal tunnel syndrome, unspecified upper limb: Secondary | ICD-10-CM | POA: Insufficient documentation

## 2015-09-14 DIAGNOSIS — F419 Anxiety disorder, unspecified: Secondary | ICD-10-CM | POA: Insufficient documentation

## 2015-09-14 DIAGNOSIS — N72 Inflammatory disease of cervix uteri: Secondary | ICD-10-CM | POA: Insufficient documentation

## 2015-09-14 DIAGNOSIS — R102 Pelvic and perineal pain: Secondary | ICD-10-CM | POA: Insufficient documentation

## 2015-09-14 HISTORY — PX: ROBOTIC ASSISTED TOTAL HYSTERECTOMY WITH SALPINGECTOMY: SHX6679

## 2015-09-14 LAB — PREGNANCY, URINE: PREG TEST UR: NEGATIVE

## 2015-09-14 SURGERY — ROBOTIC ASSISTED TOTAL HYSTERECTOMY WITH SALPINGECTOMY
Anesthesia: General | Site: Abdomen | Laterality: Left

## 2015-09-14 MED ORDER — SIMETHICONE 80 MG PO CHEW: 80.0000 mg | CHEWABLE_TABLET | Freq: Four times a day (QID) | ORAL | Status: DC | PRN

## 2015-09-14 MED ORDER — HYDROMORPHONE HCL 1 MG/ML IJ SOLN
INTRAMUSCULAR | Status: AC
  Filled 2015-09-14: qty 1

## 2015-09-14 MED ORDER — SODIUM CHLORIDE 0.9 % IJ SOLN
INTRAMUSCULAR | Status: AC
  Filled 2015-09-14: qty 50

## 2015-09-14 MED ORDER — LACTATED RINGERS IV SOLN
INTRAVENOUS | Status: DC
  Administered 2015-09-14 (×3): via INTRAVENOUS

## 2015-09-14 MED ORDER — BUPIVACAINE HCL (PF) 0.25 % IJ SOLN
INTRAMUSCULAR | Status: DC | PRN
  Administered 2015-09-14: 10 mL
  Administered 2015-09-14: 5 mL

## 2015-09-14 MED ORDER — CEFAZOLIN SODIUM-DEXTROSE 2-3 GM-% IV SOLR
INTRAVENOUS | Status: AC
  Filled 2015-09-14: qty 50

## 2015-09-14 MED ORDER — DEXTROSE 5 % IV SOLN
2.0000 g | INTRAVENOUS | Status: AC
  Administered 2015-09-14: 2 g via INTRAVENOUS
  Filled 2015-09-14: qty 20

## 2015-09-14 MED ORDER — ONDANSETRON HCL 4 MG/2ML IJ SOLN: 4.0000 mg | Freq: Four times a day (QID) | INTRAMUSCULAR | Status: DC | PRN

## 2015-09-14 MED ORDER — ROPIVACAINE HCL 5 MG/ML IJ SOLN
INTRAMUSCULAR | Status: AC
  Filled 2015-09-14: qty 30

## 2015-09-14 MED ORDER — LIDOCAINE HCL (CARDIAC) 20 MG/ML IV SOLN
INTRAVENOUS | Status: DC | PRN
  Administered 2015-09-14: 80 mg via INTRAVENOUS

## 2015-09-14 MED ORDER — SUCCINYLCHOLINE CHLORIDE 200 MG/10ML IV SOSY
PREFILLED_SYRINGE | INTRAVENOUS | Status: DC | PRN
  Administered 2015-09-14: 110 mg via INTRAVENOUS

## 2015-09-14 MED ORDER — MENTHOL 3 MG MT LOZG: 1.0000 | LOZENGE | OROMUCOSAL | Status: DC | PRN

## 2015-09-14 MED ORDER — ONDANSETRON HCL 4 MG/2ML IJ SOLN
INTRAMUSCULAR | Status: DC | PRN
  Administered 2015-09-14: 4 mg via INTRAVENOUS

## 2015-09-14 MED ORDER — DEXAMETHASONE SODIUM PHOSPHATE 4 MG/ML IJ SOLN
INTRAMUSCULAR | Status: DC | PRN
  Administered 2015-09-14: 10 mg via INTRAVENOUS

## 2015-09-14 MED ORDER — HYDROXYZINE HCL 25 MG PO TABS
25.0000 mg | ORAL_TABLET | Freq: Three times a day (TID) | ORAL | Status: DC | PRN
  Filled 2015-09-14: qty 1

## 2015-09-14 MED ORDER — SODIUM CHLORIDE 0.9 % IV SOLN
INTRAVENOUS | Status: DC
  Administered 2015-09-15: 05:00:00 via INTRAVENOUS

## 2015-09-14 MED ORDER — KETOROLAC TROMETHAMINE 30 MG/ML IJ SOLN
30.0000 mg | Freq: Four times a day (QID) | INTRAMUSCULAR | Status: DC
  Administered 2015-09-15: 30 mg via INTRAMUSCULAR
  Filled 2015-09-14: qty 1

## 2015-09-14 MED ORDER — ROCURONIUM BROMIDE 100 MG/10ML IV SOLN
INTRAVENOUS | Status: DC | PRN
  Administered 2015-09-14: 30 mg via INTRAVENOUS
  Administered 2015-09-14: 10 mg via INTRAVENOUS
  Administered 2015-09-14: 20 mg via INTRAVENOUS

## 2015-09-14 MED ORDER — EPHEDRINE 5 MG/ML INJ
INTRAVENOUS | Status: AC
  Filled 2015-09-14: qty 10

## 2015-09-14 MED ORDER — SCOPOLAMINE 1 MG/3DAYS TD PT72
1.0000 | MEDICATED_PATCH | Freq: Once | TRANSDERMAL | Status: DC
  Administered 2015-09-14: 1.5 mg via TRANSDERMAL

## 2015-09-14 MED ORDER — NEOSTIGMINE METHYLSULFATE 10 MG/10ML IV SOLN
INTRAVENOUS | Status: AC
  Filled 2015-09-14: qty 1

## 2015-09-14 MED ORDER — PANTOPRAZOLE SODIUM 40 MG PO TBEC
40.0000 mg | DELAYED_RELEASE_TABLET | Freq: Every day | ORAL | Status: DC
  Administered 2015-09-14 – 2015-09-15 (×2): 40 mg via ORAL
  Filled 2015-09-14 (×2): qty 1

## 2015-09-14 MED ORDER — GLYCOPYRROLATE 0.2 MG/ML IJ SOLN
INTRAMUSCULAR | Status: DC | PRN
  Administered 2015-09-14 (×2): 0.1 mg via INTRAVENOUS
  Administered 2015-09-14: 0.6 mg via INTRAVENOUS

## 2015-09-14 MED ORDER — GLYCOPYRROLATE 0.2 MG/ML IJ SOLN
INTRAMUSCULAR | Status: AC
Start: 2015-09-14 — End: 2015-09-14
  Filled 2015-09-14: qty 3

## 2015-09-14 MED ORDER — SUCCINYLCHOLINE CHLORIDE 20 MG/ML IJ SOLN
INTRAMUSCULAR | Status: AC
  Filled 2015-09-14: qty 1

## 2015-09-14 MED ORDER — MIDAZOLAM HCL 2 MG/2ML IJ SOLN
INTRAMUSCULAR | Status: AC
  Filled 2015-09-14: qty 2

## 2015-09-14 MED ORDER — BUPIVACAINE HCL (PF) 0.25 % IJ SOLN
INTRAMUSCULAR | Status: AC
  Filled 2015-09-14: qty 30

## 2015-09-14 MED ORDER — DEXAMETHASONE SODIUM PHOSPHATE 10 MG/ML IJ SOLN
INTRAMUSCULAR | Status: AC
  Filled 2015-09-14: qty 1

## 2015-09-14 MED ORDER — CITALOPRAM HYDROBROMIDE 10 MG PO TABS
10.0000 mg | ORAL_TABLET | Freq: Every day | ORAL | Status: DC
  Administered 2015-09-14: 10 mg via ORAL
  Filled 2015-09-14 (×3): qty 1

## 2015-09-14 MED ORDER — FENTANYL CITRATE (PF) 250 MCG/5ML IJ SOLN
INTRAMUSCULAR | Status: AC
  Filled 2015-09-14: qty 5

## 2015-09-14 MED ORDER — PHENYLEPHRINE HCL 10 MG/ML IJ SOLN
INTRAMUSCULAR | Status: DC | PRN
  Administered 2015-09-14: 80 ug via INTRAVENOUS
  Administered 2015-09-14: 40 ug via INTRAVENOUS
  Administered 2015-09-14: 80 ug via INTRAVENOUS
  Administered 2015-09-14 (×2): 40 ug via INTRAVENOUS

## 2015-09-14 MED ORDER — EPHEDRINE SULFATE 50 MG/ML IJ SOLN
INTRAMUSCULAR | Status: DC | PRN
  Administered 2015-09-14: 5 mg via INTRAVENOUS

## 2015-09-14 MED ORDER — SODIUM CHLORIDE 0.9 % IJ SOLN
INTRAMUSCULAR | Status: DC | PRN
  Administered 2015-09-14: 30 mL

## 2015-09-14 MED ORDER — LIDOCAINE HCL (CARDIAC) 20 MG/ML IV SOLN
INTRAVENOUS | Status: AC
  Filled 2015-09-14: qty 5

## 2015-09-14 MED ORDER — PROPOFOL 10 MG/ML IV BOLUS
INTRAVENOUS | Status: DC | PRN
  Administered 2015-09-14: 150 mg via INTRAVENOUS

## 2015-09-14 MED ORDER — OXYCODONE-ACETAMINOPHEN 5-325 MG PO TABS: 1.0000 | ORAL_TABLET | ORAL | Status: DC | PRN

## 2015-09-14 MED ORDER — ONDANSETRON HCL 4 MG/2ML IJ SOLN: 4.0000 mg | Freq: Once | INTRAMUSCULAR | Status: DC | PRN

## 2015-09-14 MED ORDER — PROPOFOL 10 MG/ML IV BOLUS
INTRAVENOUS | Status: AC
  Filled 2015-09-14: qty 20

## 2015-09-14 MED ORDER — LAMOTRIGINE 200 MG PO TABS
200.0000 mg | ORAL_TABLET | Freq: Every day | ORAL | Status: DC
  Administered 2015-09-14: 200 mg via ORAL
  Filled 2015-09-14 (×3): qty 1

## 2015-09-14 MED ORDER — PHENYLEPHRINE 40 MCG/ML (10ML) SYRINGE FOR IV PUSH (FOR BLOOD PRESSURE SUPPORT)
PREFILLED_SYRINGE | INTRAVENOUS | Status: AC
  Filled 2015-09-14: qty 10

## 2015-09-14 MED ORDER — PROMETHAZINE HCL 25 MG PO TABS: 25.0000 mg | ORAL_TABLET | Freq: Four times a day (QID) | ORAL | Status: DC | PRN

## 2015-09-14 MED ORDER — DEXAMETHASONE SODIUM PHOSPHATE 4 MG/ML IJ SOLN: INTRAMUSCULAR | Status: DC | PRN

## 2015-09-14 MED ORDER — FENTANYL CITRATE (PF) 250 MCG/5ML IJ SOLN
INTRAMUSCULAR | Status: DC | PRN
  Administered 2015-09-14: 50 ug via INTRAVENOUS
  Administered 2015-09-14: 25 ug via INTRAVENOUS
  Administered 2015-09-14: 50 ug via INTRAVENOUS
  Administered 2015-09-14: 100 ug via INTRAVENOUS

## 2015-09-14 MED ORDER — HYDROMORPHONE HCL 1 MG/ML IJ SOLN: 1.0000 mg | INTRAMUSCULAR | Status: DC | PRN

## 2015-09-14 MED ORDER — KETOROLAC TROMETHAMINE 30 MG/ML IJ SOLN
30.0000 mg | Freq: Four times a day (QID) | INTRAMUSCULAR | Status: DC
  Administered 2015-09-14 – 2015-09-15 (×3): 30 mg via INTRAVENOUS
  Filled 2015-09-14 (×3): qty 1

## 2015-09-14 MED ORDER — NEOSTIGMINE METHYLSULFATE 10 MG/10ML IV SOLN
INTRAVENOUS | Status: DC | PRN
  Administered 2015-09-14: 4 mg via INTRAVENOUS

## 2015-09-14 MED ORDER — MIDAZOLAM HCL 2 MG/2ML IJ SOLN
INTRAMUSCULAR | Status: DC | PRN
  Administered 2015-09-14: 2 mg via INTRAVENOUS

## 2015-09-14 MED ORDER — SODIUM CHLORIDE 0.9 % IV SOLN
INTRAVENOUS | Status: DC | PRN
  Administered 2015-09-14: 60 mL

## 2015-09-14 MED ORDER — HYDROMORPHONE HCL 1 MG/ML IJ SOLN
0.2500 mg | INTRAMUSCULAR | Status: DC | PRN
  Administered 2015-09-14 (×2): 0.5 mg via INTRAVENOUS

## 2015-09-14 MED ORDER — SCOPOLAMINE 1 MG/3DAYS TD PT72
MEDICATED_PATCH | TRANSDERMAL | Status: AC
  Administered 2015-09-14: 1.5 mg via TRANSDERMAL
  Filled 2015-09-14: qty 1

## 2015-09-14 MED ORDER — ONDANSETRON HCL 4 MG/2ML IJ SOLN
INTRAMUSCULAR | Status: AC
  Filled 2015-09-14: qty 2

## 2015-09-14 MED ORDER — ONDANSETRON HCL 4 MG PO TABS: 4.0000 mg | ORAL_TABLET | Freq: Four times a day (QID) | ORAL | Status: DC | PRN

## 2015-09-14 MED ORDER — IBUPROFEN 600 MG PO TABS: 600.0000 mg | ORAL_TABLET | Freq: Four times a day (QID) | ORAL | Status: DC | PRN

## 2015-09-14 SURGICAL SUPPLY — 54 items
BARRIER ADHS 3X4 INTERCEED (GAUZE/BANDAGES/DRESSINGS) ×1 IMPLANT
BRR ADH 4X3 ABS CNTRL BYND (GAUZE/BANDAGES/DRESSINGS)
CATH FOLEY 3WAY  5CC 16FR (CATHETERS) ×2
CATH FOLEY 3WAY 5CC 16FR (CATHETERS) ×1 IMPLANT
CLOTH BEACON ORANGE TIMEOUT ST (SAFETY) ×3 IMPLANT
CONT PATH 16OZ SNAP LID 3702 (MISCELLANEOUS) ×3 IMPLANT
COVER BACK TABLE 60X90IN (DRAPES) ×6 IMPLANT
COVER TIP SHEARS 8 DVNC (MISCELLANEOUS) ×1 IMPLANT
COVER TIP SHEARS 8MM DA VINCI (MISCELLANEOUS) ×2
DECANTER SPIKE VIAL GLASS SM (MISCELLANEOUS) ×3 IMPLANT
DURAPREP 26ML APPLICATOR (WOUND CARE) ×3 IMPLANT
ELECT REM PT RETURN 9FT ADLT (ELECTROSURGICAL) ×3
ELECTRODE REM PT RTRN 9FT ADLT (ELECTROSURGICAL) ×1 IMPLANT
GAUZE VASELINE 3X9 (GAUZE/BANDAGES/DRESSINGS) IMPLANT
GLOVE BIO SURGEON STRL SZ 6.5 (GLOVE) ×2 IMPLANT
GLOVE BIO SURGEONS STRL SZ 6.5 (GLOVE) ×1
GLOVE BIOGEL PI IND STRL 7.0 (GLOVE) ×4 IMPLANT
GLOVE BIOGEL PI INDICATOR 7.0 (GLOVE) ×8
KIT ACCESSORY DA VINCI DISP (KITS) ×2
KIT ACCESSORY DVNC DISP (KITS) ×1 IMPLANT
LEGGING LITHOTOMY PAIR STRL (DRAPES) ×3 IMPLANT
LIQUID BAND (GAUZE/BANDAGES/DRESSINGS) ×4 IMPLANT
OCCLUDER COLPOPNEUMO (BALLOONS) ×2 IMPLANT
PACK ROBOT WH (CUSTOM PROCEDURE TRAY) ×3 IMPLANT
PACK ROBOTIC GOWN (GOWN DISPOSABLE) ×3 IMPLANT
PAD PREP 24X48 CUFFED NSTRL (MISCELLANEOUS) ×6 IMPLANT
PAD TRENDELENBURG POSITION (MISCELLANEOUS) ×3 IMPLANT
SET CYSTO W/LG BORE CLAMP LF (SET/KITS/TRAYS/PACK) IMPLANT
SET IRRIG TUBING LAPAROSCOPIC (IRRIGATION / IRRIGATOR) ×3 IMPLANT
SET TRI-LUMEN FLTR TB AIRSEAL (TUBING) ×2 IMPLANT
SLEEVE XCEL OPT CAN 5 100 (ENDOMECHANICALS) ×1 IMPLANT
SUT VIC AB 0 CT1 27 (SUTURE) ×6
SUT VIC AB 0 CT1 27XBRD ANBCTR (SUTURE) ×2 IMPLANT
SUT VIC AB 4-0 PS2 27 (SUTURE) ×6 IMPLANT
SUT VICRYL 0 UR6 27IN ABS (SUTURE) ×6 IMPLANT
SUT VICRYL 4-0 PS2 18IN ABS (SUTURE) IMPLANT
SUT VLOC 180 0 9IN  GS21 (SUTURE) ×2
SUT VLOC 180 0 9IN GS21 (SUTURE) ×1 IMPLANT
SYR 50ML LL SCALE MARK (SYRINGE) ×3 IMPLANT
SYSTEM CONVERTIBLE TROCAR (TROCAR) ×2 IMPLANT
TIP RUMI ORANGE 6.7MMX12CM (TIP) IMPLANT
TIP UTERINE 5.1X6CM LAV DISP (MISCELLANEOUS) IMPLANT
TIP UTERINE 6.7X10CM GRN DISP (MISCELLANEOUS) IMPLANT
TIP UTERINE 6.7X6CM WHT DISP (MISCELLANEOUS) IMPLANT
TIP UTERINE 6.7X8CM BLUE DISP (MISCELLANEOUS) ×2 IMPLANT
TOWEL OR 17X24 6PK STRL BLUE (TOWEL DISPOSABLE) ×9 IMPLANT
TROCAR 12M 150ML BLUNT (TROCAR) IMPLANT
TROCAR DISP BLADELESS 8 DVNC (TROCAR) ×1 IMPLANT
TROCAR DISP BLADELESS 8MM (TROCAR) ×2
TROCAR HASSON GELL 12X100 (TROCAR) IMPLANT
TROCAR PORT AIRSEAL 8X120 (TROCAR) IMPLANT
TROCAR XCEL 12X100 BLDLESS (ENDOMECHANICALS) ×3 IMPLANT
TROCAR XCEL NON-BLD 5MMX100MML (ENDOMECHANICALS) ×1 IMPLANT
WATER STERILE IRR 1000ML POUR (IV SOLUTION) ×9 IMPLANT

## 2015-09-14 NOTE — Anesthesia Postprocedure Evaluation (Signed)
Anesthesia Post Note  Patient: Helen Greene  Procedure(s) Performed: Procedure(s) (LRB): ROBOTIC ASSISTED TOTAL HYSTERECTOMY WITH LEFT SALPINGECTOMY (Left)  Patient location during evaluation: PACU Anesthesia Type: General Level of consciousness: awake and alert Pain management: pain level controlled Vital Signs Assessment: post-procedure vital signs reviewed and stable Respiratory status: spontaneous breathing, nonlabored ventilation, respiratory function stable and patient connected to nasal cannula oxygen Cardiovascular status: blood pressure returned to baseline and stable Postop Assessment: no signs of nausea or vomiting Anesthetic complications: no    Last Vitals:  Filed Vitals:   09/14/15 1730 09/14/15 1752  BP: 104/69 106/59  Pulse: 93 101  Temp:  37 C  Resp: 15     Last Pain:  Filed Vitals:   09/14/15 1803  PainSc: 2                  Kindel Rochefort JENNETTE

## 2015-09-14 NOTE — Op Note (Signed)
09/14/2015  4:22 PM  PATIENT:  Helen Greene  45 y.o. female  PRE-OPERATIVE DIAGNOSIS:  Chronic Pelvic Pain, Abnormal Essure Placement  POST-OPERATIVE DIAGNOSIS:  Chronic Pelvic Pain, Abnormal Essure Placement  PROCEDURE:  Procedure(s): ROBOTIC ASSISTED TOTAL HYSTERECTOMY WITH LEFT SALPINGECTOMY (Left)  SURGEON:  Surgeon(s) and Role:    * Noland FordyceKelly Suriya Kovarik, MD - Primary    * Shea EvansVaishali Mody, MD - Assisting  PHYSICIAN ASSISTANT:   ASSISTANTS: Mody, MD   ANESTHESIA:   local and general  EBL:  Total I/O In: 2200 [I.V.:2200] Out: 130 [Urine:100; Blood:30]  BLOOD ADMINISTERED:none  DRAINS: Urinary Catheter (Foley)   LOCAL MEDICATIONS USED:  MARCAINE    and BUPIVICAINE   SPECIMEN:  Source of Specimen:  Uterus cervix and left fallopian tube  DISPOSITION OF SPECIMEN:  PATHOLOGY  COUNTS:  YES  TOURNIQUET:  * No tourniquets in log *  DICTATION: .Note written in EPIC  PLAN OF CARE: Admit for overnight observation  PATIENT DISPOSITION:  PACU - hemodynamically stable.   Delay start of Pharmacological VTE agent (>24hrs) due to surgical blood loss or risk of bleeding: yes  Procedure:Robotic-assisted total laparoscopic hysterectomy, left salpingectomy Complications: none Antibiotics: 2 g Ancef Findings: nl liver edge, no evidence endometriosis, nl b/l ovaries, absent right tube, normal left tube with essure coil seen sticking into the left cornual region. Normal size uterus with some adhesions to the bladder.  Hemostasis post-procedure with peristalsis of bilateral ureters.   Indications: This is a 45 year old patient with no longer desire for fertility who is status post right salpingectomy for ectopic pregnancy and a left essure coil placement. Patient has developed chronic pelvic pain since insertion of the sure coil which was noted to be malpositioned. Given the chronic pain and chronic vaginal discharge patient opted for definitive surgical management. Pt has failed conservative  therapy and wants definitive surgical management.  Procedure: After informed consent and discussion of alternatives to hysterectomy, the patient was taken to the operating room where general anesthesia was initiated without difficulty. She was prepped and draped in normal sterile fashion in the dorsal supine lithotomy position. A Foley catheter was inserted sterilely into the bladder. A bimanual examination was done to assess the size and position of the uterus. A weighted speculum was placed in the vagina and deaver retractors were used on the anterior vaginal wall. .The cervix was grasped with tenaculum. The cervix was sounded to 7 cm. The cervix was assessed to identify the Rumi-Co size.  A medium cup and an 8 cm shaft was used. The uterine balloon was inflated.  Gloved were changed. Attention was then turned to the patient's abdomen. 0.5 % marcaine was used prior to all incision. A total of 20 cc of marcaine was used.  A 10 mm incision was made in the umbilicus over the old scar and blunt and sharp dissection was done until the fascia was identified. An umbilical hernia containing fat was noted in care was taken to avoid this hernia . Fascia was then grasped with Kocher clamps x2 and entered sharply. A pursestring suture of 0 Vicryl was then placed along the incision and a non-bladed Roseanne RenoHassan was inserted into the peritoneal cavity. Intraperitoneal placement was confirmed with the use of the camera and pneumoperitoneum was created to 15 mm of mercury. The pursestring suture was secured around the port and pneumoperitoneum was maintained. Brief survey of the abdomen and pelvis was done with findings as above. The abdominal wall was assessed and additional port sites were marked.  8 mm incisions were placed in the right (1) and left lower quadrants (2) and non-bladed ports were placed under direct visualization. The robot was brought to the patient's side and attached with the right side docking. The robotic  instruments were placed under direct visualization until proper placement just over the uterus.  I then went to the robotic console. Brief survey of the patient's abdomen and pelvis revealed  findings as above.Bilateral tubes and ovaries were normal. Bilateral ureters were seen peristalsing in the pelvic sidewall.  No significant adhesions were noted. I began on the left side: the  left tube was serially cauterized with bipolar cautery with the use of the PK device and transected with the monopolar scissors. The  left utero-ovarian ligament was serially cauterized with the bipolar PK and transected with the monopolar scissors. The left round ligament was cauterized and divided.  The anterior leaf of the broad ligament was dissected bluntly then monopolar cautery was used to separate the anterior and posterior leafs of the broad ligament. This was carried down through to the bladder flap. Attention was then turned to the patient's  Right side: the right tube was absent,  the  right uterine ovarian ligament was serially cauterized with the PK and transected with the monopolar scissors. The right  broad ligament was cauterized and transected. The anterior and posterior leaves of the broad ligament were bluntly and sharply dissected apart from each other. Monopolar cautery was used to come across the anterior leaf of the right broad ligament. This dissection was carried down across to the midline to create the bladder flap. Some adhesions were noticed at the level of the latter and these were taken down sharply .The leftt uterine artery was serially cauterized with the PK and transected with the monopolar scissors. The right uterine artery and then serially cauterized with the PK and transected with the monopolar scissors. The uterus was placed on stretch to allow better visualization of the arteries. The bladder flap was further taken down both sharply and with cautery. Cautery was used for hemostasis on several  places along the bladder flap. At this point with the pressure inward on the Rumi, the posterior  colpotomy was made with the monopolar scissors. This was carried around to the patient's right side. Additional bipolar cautery was used on the  both angles of the cuff- this was carried out with the PK bipolar. The uterus was then positioned  posteriorly  to allow easy access to the  anterior  colpotomy. This was carried out with the monopolar scissors.  Good hemostasis was noted along the angles of the incision.  With manipulation of the specimen, the uterus, cervix and bilateral tubes were pulled out through the vagina.   Irrigation was used and the vaginal cuff appeared hemostatic. A 9 inch V-lock suture was placed though the umbilical port. Instruments were changed to allow a suture cut needle driver through the #1 port and and a long-tipped forceps through the #2 port. The right angle was closed and locked with the V-lock suture. Running suture was carried out tothe L angle.  A more superficial layer of suture was placed travelling back to the right angle. The suture was cut and removed.  Excellent hemostasis was noted. Suction irrigation was carried out and hemostasis was assured along the vaginal cuff. The utero-ovarian ligaments were reassessed also found to be hemostatic. All free fluid was removed from the abdomen. The robotic portion was completed. The robot was undocked.   By  standard laparoscopy the pelvis was irrigated and evaluated. The suture was removed. The patient was placed in reverse Trendelenburg, all additional fluid was suctioned from the abdomen and pelvis the cuff was reinspected and  found to be hemostatic. All pedicles were also found to be hemostatic. 20 CC 1/4% marcaine was placed into the peritoneal cavity.  Under direct visualization the ports were removed. Pneumoperitoneum was released and the umbilical port was removed.  The  pursestring suture at the umbilicus was then closed.  No additional fascial incisions were closed due to the small size and the nondilated nature of these incisions. A 4-0 Vicryl was used to close the additional laparoscopic ports sites. Good hemostasis was noted.  Sponge lap and needle counts were correct x3 the patient was woken from general anesthesia having tolerated the procedure well and taken to the recovery room in a stable fashion.

## 2015-09-14 NOTE — Transfer of Care (Signed)
Immediate Anesthesia Transfer of Care Note  Patient: Helen Greene  Procedure(s) Performed: Procedure(s): ROBOTIC ASSISTED TOTAL HYSTERECTOMY WITH LEFT SALPINGECTOMY (Left)  Patient Location: PACU  Anesthesia Type:General  Level of Consciousness: awake, alert , oriented and patient cooperative  Airway & Oxygen Therapy: Patient Spontanous Breathing and Patient connected to nasal cannula oxygen  Post-op Assessment: Report given to RN and Post -op Vital signs reviewed and stable  Post vital signs: Reviewed and stable  Last Vitals:  Filed Vitals:   09/14/15 1123  BP: 112/92  Pulse: 90  Temp: 36.9 C  Resp: 16    Complications: No apparent anesthesia complications

## 2015-09-14 NOTE — Anesthesia Procedure Notes (Signed)
Procedure Name: Intubation Date/Time: 09/14/2015 1:33 PM Performed by: Graciela HusbandsFUSSELL, Anusha Claus O Pre-anesthesia Checklist: Patient identified, Timeout performed, Emergency Drugs available, Suction available and Patient being monitored Patient Re-evaluated:Patient Re-evaluated prior to inductionOxygen Delivery Method: Circle system utilized Preoxygenation: Pre-oxygenation with 100% oxygen Intubation Type: IV induction Ventilation: Mask ventilation with difficulty and Oral airway inserted - appropriate to patient size Laryngoscope Size: Mac and 3 Grade View: Grade I Tube size: 7.0 mm Number of attempts: 1 Airway Equipment and Method: Stylet Placement Confirmation: ETT inserted through vocal cords under direct vision,  positive ETCO2 and breath sounds checked- equal and bilateral Secured at: 21 cm Tube secured with: Tape Dental Injury: Teeth and Oropharynx as per pre-operative assessment

## 2015-09-14 NOTE — H&P (Signed)
CC; chronic pelvic pain  HPI: 5045 G3P2012 with h/o ectopic preg and R salpingectomy then subsequent Essure placement into L tube. Since then, chronic pelvic pain and essure coil noted into uterine cavity. Given chronic pain, for hysterectomy with L salpingectomy an dovarian retention  Past Medical History  Diagnosis Date  . Elevated white blood cell count   . Anxiety   . GERD (gastroesophageal reflux disease)   . Elevated white blood cell count 09/25/2011  . Kidney stone   . Allergy   . Depression   . Complication of anesthesia   . PONV (postoperative nausea and vomiting)   . Shortness of breath dyspnea     with exertion  . Neuromuscular disorder (HCC)     carpel tunnel disease  . Hypertension   . Restless leg syndrome   . Sleep apnea     does not have CPAP machine, mostly affects back sleeping was encouraged to sleep on her sides  . Headache     Migraine    Past Surgical History  Procedure Laterality Date  . Cholecystectomy    . Ectopic pregnancy surgery    . Wisdom tooth extraction    . Dilation and curettage of uterus  1993  . Essure tubal ligation    . Upper gi endoscopy    . Colonoscopy    . Lithotripsy      Med: wellbutrin, celexa, Hctz  PE Filed Vitals:   09/14/15 1123  BP: 112/92  Pulse: 90  Temp: 98.5 F (36.9 C)  TempSrc: Oral  Resp: 16  SpO2: 98%   Gen; well appearing, no distress Abd: soft, nt, nd LE; nt, no edema Gu; def to or  CBC    Component Value Date/Time   WBC 12.5* 09/08/2015 1150   WBC 13.0* 02/13/2015 1943   WBC 11.7* 09/25/2011 1353   RBC 4.70 09/08/2015 1150   RBC 4.82 02/13/2015 1943   RBC 4.60 09/25/2011 1353   HGB 14.5 09/08/2015 1150   HGB 13.1 02/13/2015 1943   HGB 14.5 09/25/2011 1353   HCT 42.8 09/08/2015 1150   HCT 43.0 02/13/2015 1943   HCT 42.5 09/25/2011 1353   PLT 391 09/08/2015 1150   PLT 314 09/25/2011 1353   MCV 91.1 09/08/2015 1150   MCV 89.1 02/13/2015 1943   MCV 92.4 09/25/2011 1353   MCH 30.9  09/08/2015 1150   MCH 27.1 02/13/2015 1943   MCH 31.6 09/25/2011 1353   MCHC 33.9 09/08/2015 1150   MCHC 30.4* 02/13/2015 1943   MCHC 34.2 09/25/2011 1353   RDW 13.0 09/08/2015 1150   RDW 12.6 09/25/2011 1353   LYMPHSABS 2.3 07/24/2014 1009   LYMPHSABS 2.9 09/25/2011 1353   MONOABS 0.8 07/24/2014 1009   MONOABS 0.8 09/25/2011 1353   EOSABS 0.2 07/24/2014 1009   EOSABS 0.3 09/25/2011 1353   BASOSABS 0.0 07/24/2014 1009   BASOSABS 0.1 09/25/2011 1353     A/p: chronic pain, no robotic hyst, l salpingectomy, pt aware r/b of procedure including inability to relieve all pelvic pain,.  Helen Greene A. 09/14/2015 1:13 PM

## 2015-09-14 NOTE — Anesthesia Preprocedure Evaluation (Signed)
Anesthesia Evaluation  Patient identified by MRN, date of birth, ID band Patient awake    Reviewed: Allergy & Precautions, NPO status , Patient's Chart, lab work & pertinent test results  History of Anesthesia Complications (+) PONV and history of anesthetic complications  Airway Mallampati: II  TM Distance: >3 FB Neck ROM: Full    Dental no notable dental hx. (+) Dental Advisory Given   Pulmonary sleep apnea ,    Pulmonary exam normal breath sounds clear to auscultation       Cardiovascular hypertension, Pt. on medications Normal cardiovascular exam Rhythm:Regular Rate:Normal     Neuro/Psych  Headaches, PSYCHIATRIC DISORDERS Anxiety Depression    GI/Hepatic Neg liver ROS, GERD  Medicated and Controlled,  Endo/Other  obesity  Renal/GU negative Renal ROS  negative genitourinary   Musculoskeletal negative musculoskeletal ROS (+)   Abdominal   Peds negative pediatric ROS (+)  Hematology negative hematology ROS (+)   Anesthesia Other Findings   Reproductive/Obstetrics (+) Pregnancy                             Anesthesia Physical Anesthesia Plan  ASA: II  Anesthesia Plan: General   Post-op Pain Management:    Induction: Intravenous  Airway Management Planned: Oral ETT  Additional Equipment:   Intra-op Plan:   Post-operative Plan: Extubation in OR  Informed Consent: I have reviewed the patients History and Physical, chart, labs and discussed the procedure including the risks, benefits and alternatives for the proposed anesthesia with the patient or authorized representative who has indicated his/her understanding and acceptance.   Dental advisory given  Plan Discussed with: CRNA  Anesthesia Plan Comments:         Anesthesia Quick Evaluation

## 2015-09-14 NOTE — Brief Op Note (Signed)
09/14/2015  4:22 PM  PATIENT:  Helen Greene  45 y.o. female  PRE-OPERATIVE DIAGNOSIS:  Chronic Pelvic Pain, Abnormal Essure Placement  POST-OPERATIVE DIAGNOSIS:  Chronic Pelvic Pain, Abnormal Essure Placement  PROCEDURE:  Procedure(s): ROBOTIC ASSISTED TOTAL HYSTERECTOMY WITH LEFT SALPINGECTOMY (Left)  SURGEON:  Surgeon(s) and Role:    * Noland FordyceKelly Layali Freund, MD - Primary    * Shea EvansVaishali Mody, MD - Assisting  PHYSICIAN ASSISTANT:   ASSISTANTS: Mody, MD   ANESTHESIA:   local and general  EBL:  Total I/O In: 2200 [I.V.:2200] Out: 130 [Urine:100; Blood:30]  BLOOD ADMINISTERED:none  DRAINS: Urinary Catheter (Foley)   LOCAL MEDICATIONS USED:  MARCAINE    and BUPIVICAINE   SPECIMEN:  Source of Specimen:  Uterus cervix and left fallopian tube  DISPOSITION OF SPECIMEN:  PATHOLOGY  COUNTS:  YES  TOURNIQUET:  * No tourniquets in log *  DICTATION: .Note written in EPIC  PLAN OF CARE: Admit for overnight observation  PATIENT DISPOSITION:  PACU - hemodynamically stable.   Delay start of Pharmacological VTE agent (>24hrs) due to surgical blood loss or risk of bleeding: yes

## 2015-09-15 ENCOUNTER — Encounter (HOSPITAL_COMMUNITY): Payer: Self-pay | Admitting: Obstetrics

## 2015-09-15 DIAGNOSIS — G8929 Other chronic pain: Secondary | ICD-10-CM | POA: Diagnosis not present

## 2015-09-15 LAB — CBC
HEMATOCRIT: 36.2 % (ref 36.0–46.0)
HEMOGLOBIN: 12.3 g/dL (ref 12.0–15.0)
MCH: 31.4 pg (ref 26.0–34.0)
MCHC: 34 g/dL (ref 30.0–36.0)
MCV: 92.3 fL (ref 78.0–100.0)
Platelets: 298 10*3/uL (ref 150–400)
RBC: 3.92 MIL/uL (ref 3.87–5.11)
RDW: 13.2 % (ref 11.5–15.5)
WBC: 17.5 10*3/uL — AB (ref 4.0–10.5)

## 2015-09-15 MED ORDER — IBUPROFEN 600 MG PO TABS: 600.0000 mg | ORAL_TABLET | Freq: Four times a day (QID) | ORAL | Status: DC | PRN

## 2015-09-15 MED ORDER — OXYCODONE-ACETAMINOPHEN 5-325 MG PO TABS: 1.0000 | ORAL_TABLET | ORAL | Status: DC | PRN

## 2015-09-15 NOTE — Discharge Summary (Signed)
Helen Greene MRN: [id number] DOB/AGE: [birth year] 45 y.o.  Admit date: 09/14/2015 Discharge date: 09/15/15  Admission Diagnoses: Chronic Pelvic Pain, Abnormal Essure Placement  Discharge Diagnoses: Chronic Pelvic Pain, Abnormal Essure Placement        Active Problems:   S/P total hysterectomy   Discharged Condition: stable  Hospital Course: admitted for robotic TLH, uncomplicated procedure completed  On POD #1 pt notes nl void after foley removed, flatus, ambulating, tol reg po, pain controlled with po meds. No CP, no SOB, mild congestion, no vaginal bleeding  Filed Vitals:   09/14/15 2100 09/15/15 0204 09/15/15 0513 09/15/15 1000  BP: 94/51 101/55 99/45 94/52   Pulse: 86 76 70 79  Temp: 98.6 F (37 C) 97.9 F (36.6 C) 98.2 F (36.8 C) 98.5 F (36.9 C)  TempSrc: Oral Oral Oral Oral  Resp: Height:      Weight:      SpO2: 98% 98% 98% 98%   Gen: sleeping comfortably, awakes easily, well appearing CV- RRR Pulm CTAB Abd, obese, approp tender, ND, no rebound/ guarding Inc; clean, dressed LE nt, no edema  CBC    Component Value Date/Time   WBC 17.5* 09/15/2015 0552   WBC 13.0* 02/13/2015 1943   WBC 11.7* 09/25/2011 1353   RBC 3.92 09/15/2015 0552   RBC 4.82 02/13/2015 1943   RBC 4.60 09/25/2011 1353   HGB 12.3 09/15/2015 0552   HGB 13.1 02/13/2015 1943   HGB 14.5 09/25/2011 1353   HCT 36.2 09/15/2015 0552   HCT 43.0 02/13/2015 1943   HCT 42.5 09/25/2011 1353   PLT 298 09/15/2015 0552   PLT 314 09/25/2011 1353   MCV 92.3 09/15/2015 0552   MCV 89.1 02/13/2015 1943   MCV 92.4 09/25/2011 1353   MCH 31.4 09/15/2015 0552   MCH 27.1 02/13/2015 1943   MCH 31.6 09/25/2011 1353   MCHC 34.0 09/15/2015 0552   MCHC 30.4* 02/13/2015 1943   MCHC 34.2 09/25/2011 1353   RDW 13.2 09/15/2015 0552   RDW 12.6 09/25/2011 1353   LYMPHSABS 2.3 07/24/2014 1009   LYMPHSABS 2.9 09/25/2011 1353   MONOABS 0.8 07/24/2014 1009   MONOABS 0.8 09/25/2011 1353   EOSABS 0.2  07/24/2014 1009   EOSABS 0.3 09/25/2011 1353   BASOSABS 0.0 07/24/2014 1009   BASOSABS 0.1 09/25/2011 1353     Consults: None  Treatments: surgery: TLH, L salpingectomy  Disposition: 01-Home or Self Care     Medication List    STOP taking these medications        amoxicillin 500 MG capsule  Commonly known as:  AMOXIL     Oxycodone HCl 10 MG Tabs      TAKE these medications        buPROPion 300 MG 24 hr tablet  Commonly known as:  WELLBUTRIN XL  Reported on 06/07/2015     cetirizine 10 MG tablet  Commonly known as:  ZYRTEC  Take 10 mg by mouth daily.     citalopram 20 MG tablet  Commonly known as:  CELEXA  Take 10 mg by mouth daily.     escitalopram 10 MG tablet  Commonly known as:  LEXAPRO  TK 1 T PO QD     folic acid 1 MG tablet  Commonly known as:  FOLVITE  Take 1 mg by mouth daily.     hydrOXYzine 25 MG tablet  Commonly known as:  ATARAX/VISTARIL  Take 25 mg by mouth 3 (three) times daily as needed  for anxiety or vomiting.     ibuprofen 600 MG tablet  Commonly known as:  ADVIL,MOTRIN  Take 1 tablet (600 mg total) by mouth every 6 (six) hours as needed (mild pain).     lamoTRIgine 200 MG tablet  Commonly known as:  LAMICTAL  Take 200 mg by mouth daily.     lamoTRIgine 200 MG tablet  Commonly known as:  LAMICTAL  Reported on 07/30/2015     lisinopril-hydrochlorothiazide 10-12.5 MG tablet  Commonly known as:  PRINZIDE,ZESTORETIC  Take 1 tablet by mouth daily.     naratriptan 2.5 MG tablet  Commonly known as:  AMERGE  Take 1 tablet (2.5 mg total) by mouth as needed for migraine. Take one (1) tablet at onset of headache; if returns or does not resolve, may repeat after 4 hours; do not exceed five (5) mg in 24 hours.     ondansetron 4 MG tablet  Commonly known as:  ZOFRAN  Take 4 mg by mouth every 8 (eight) hours as needed for nausea or vomiting.     oxyCODONE-acetaminophen 5-325 MG tablet  Commonly known as:  PERCOCET/ROXICET  Take 1-2 tablets  by mouth every 4 (four) hours as needed for severe pain (moderate to severe pain (when tolerating fluids)).     pramipexole 0.125 MG tablet  Commonly known as:  MIRAPEX  1 pill each night x 1 week, then 2 pills each night x 1 week, then 3 pills each night thereafter. Take 90-120 minutes before bedtime.     promethazine 25 MG tablet  Commonly known as:  PHENERGAN  Take 25 mg by mouth every 6 (six) hours as needed for nausea or vomiting.     terconazole 0.4 % vaginal cream  Commonly known as:  TERAZOL 7  Place 1 applicator vaginally 2 (two) times daily.     topiramate 50 MG tablet  Commonly known as:  TOPAMAX  Take 1 tablet (50 mg total) by mouth at bedtime.         Signed: Lendon ColonelFOGLEMAN,Jermane Brayboy A., MD 09/15/2015, 1:34 PM

## 2016-03-14 ENCOUNTER — Other Ambulatory Visit: Payer: Self-pay | Admitting: Family Medicine

## 2016-03-14 ENCOUNTER — Other Ambulatory Visit: Payer: Self-pay | Admitting: Physician Assistant

## 2016-03-14 DIAGNOSIS — I10 Essential (primary) hypertension: Secondary | ICD-10-CM

## 2016-03-14 DIAGNOSIS — H6982 Other specified disorders of Eustachian tube, left ear: Secondary | ICD-10-CM

## 2016-03-14 DIAGNOSIS — J309 Allergic rhinitis, unspecified: Secondary | ICD-10-CM

## 2016-03-21 ENCOUNTER — Ambulatory Visit (INDEPENDENT_AMBULATORY_CARE_PROVIDER_SITE_OTHER): Payer: 59 | Admitting: Family Medicine

## 2016-03-21 VITALS — BP 126/78 | HR 87 | Temp 98.5°F | Resp 18 | Ht 60.0 in | Wt 192.0 lb

## 2016-03-21 DIAGNOSIS — R140 Abdominal distension (gaseous): Secondary | ICD-10-CM | POA: Diagnosis not present

## 2016-03-21 DIAGNOSIS — R300 Dysuria: Secondary | ICD-10-CM

## 2016-03-21 LAB — POCT URINALYSIS DIP (MANUAL ENTRY)
Bilirubin, UA: NEGATIVE
GLUCOSE UA: NEGATIVE
Ketones, POC UA: NEGATIVE
Leukocytes, UA: NEGATIVE
Nitrite, UA: NEGATIVE
Protein Ur, POC: NEGATIVE
SPEC GRAV UA: 1.025
UROBILINOGEN UA: 0.2
pH, UA: 6.5

## 2016-03-21 LAB — POC MICROSCOPIC URINALYSIS (UMFC): Mucus: ABSENT

## 2016-03-21 MED ORDER — FLUCONAZOLE 150 MG PO TABS: 150.0000 mg | ORAL_TABLET | Freq: Once | ORAL | 0 refills | Status: AC

## 2016-03-21 NOTE — Progress Notes (Signed)
.  nn

## 2016-03-21 NOTE — Patient Instructions (Addendum)
Drink plenty of fluids to keep your bladder flushed out well.  We will presumptively treat you with one Diflucan which would take care of a little yeast, with your history of yeast infections  If you develop more symptoms consistent with bacterial vaginosis come in for a recheck  Take MiraLAX on an as-needed basis to try and keep your bowels moving a little more often to evacuate you better and see if that will help your abdominal bloating  I do recommend regular active exercise such as walking.  Return as needed    IF you received an x-ray today, you will receive an invoice from [facility]. Please contact [facility] at [phone] with questions or concerns regarding your invoice.   IF you received labwork today, you will receive an invoice from [facility]. Please contact [facility] at [phone] with questions or concerns regarding your invoice.   Our billing staff will not be able to assist you with questions regarding bills from these companies.  You will be contacted with the lab results as soon as they are available. The fastest way to get your results is to activate your My Chart account. Instructions are located on the last page of this paperwork. If you have not heard from us regarding the results in 2 weeks, please contact this office.

## 2016-03-21 NOTE — Progress Notes (Signed)
Patient ID: Helen Greene, female    DOB: 03-Jan-1971  Age: 45 y.o. MRN: [id number]  Chief Complaint  Patient presents with  . Dysuria  . Bloated    Subjective:   45 year old lady who comes in with a history of several days of dysuria. She says she has been drinking a lot more a steel feels like that may contribute to it in her. She has been getting up a little more at night. She has a lower abdominal bloating sensation. Bowels moved yesterday well, but don't always move well. She is on several medications. She does not get a lot of regular exercise that she has a gym membership and is been trying to get into that more. She walks some lunchtime. 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 She is not sexually involved.  Current allergies, medications, problem list, past/family and social histories reviewed.  Objective:  BP 126/78 (BP Location: Right Arm, Patient Position: Sitting, Cuff Size: Large)   Pulse 87   Temp 98.5 F (36.9 C) (Oral)   Resp 18   Ht 5' (1.524 m)   Wt 192 lb (87.1 kg)   SpO2 97%   BMI 37.50 kg/m   No major distress. Vital stable. No CVA tenderness. Mild left lower quadrant tenderness. The abdomen feels a little full.  Assessment & Plan:   Assessment: 1. Dysuria   2. Abdominal bloating       Plan: Check urinalysis and proceed from there  Orders Placed This Encounter  Procedures  . POCT Microscopic Urinalysis ([facility])  . POCT urinalysis dipstick    No orders of the defined types were placed in this encounter.  Results for orders placed or performed in visit on 03/21/16  POCT Microscopic Urinalysis ([facility])  Result Value Ref Range   WBC,UR,HPF,POC None None WBC/hpf   RBC,UR,HPF,POC None None RBC/hpf   Bacteria None None, Too numerous to count   Mucus Absent Absent   Epithelial Cells, UR Per Microscopy Few (A) None, Too numerous to count cells/hpf  POCT urinalysis dipstick  Result Value Ref Range   Color, UA yellow yellow   Clarity, UA clear clear    Glucose, UA negative negative   Bilirubin, UA negative negative   Ketones, POC UA negative negative   Spec Grav, UA 1.025    Blood, UA trace-lysed (A) negative   pH, UA 6.5    Protein Ur, POC negative negative   Urobilinogen, UA 0.2    Nitrite, UA Negative Negative   Leukocytes, UA Negative Negative         Patient Instructions   Drink plenty of fluids to keep your bladder flushed out well.  We will presumptively treat you with one Diflucan which would take care of a little yeast, with your history of yeast infections  If you develop more symptoms consistent with bacterial vaginosis come in for a recheck  Take MiraLAX on an as-needed basis to try and keep your bowels moving a little more often to evacuate you better and see if that will help your abdominal bloating  I do recommend regular active exercise such as walking.  Return as needed    IF you received an x-ray today, you will receive an invoice from [facility]. Please contact [facility] at [phone] with questions or concerns regarding your invoice.   IF you received labwork today, you will receive an invoice from [facility]. Please contact [facility] at [phone] with questions or concerns regarding your invoice.  Our billing staff will not be able to assist you with questions regarding bills from these companies.  You will be contacted with the lab results as soon as they are available. The fastest way to get your results is to activate your My Chart account. Instructions are located on the last page of this paperwork. If you have not heard from [place] regarding the results in 2 weeks, please contact this office.        Return if symptoms worsen or fail to improve.   HOPPER,DAVID, MD 03/21/2016

## 2016-03-25 ENCOUNTER — Other Ambulatory Visit: Payer: Self-pay | Admitting: Physician Assistant

## 2016-03-25 DIAGNOSIS — J309 Allergic rhinitis, unspecified: Secondary | ICD-10-CM

## 2016-03-25 DIAGNOSIS — H6982 Other specified disorders of Eustachian tube, left ear: Secondary | ICD-10-CM

## 2016-04-13 ENCOUNTER — Other Ambulatory Visit: Payer: Self-pay | Admitting: Physician Assistant

## 2016-04-13 ENCOUNTER — Other Ambulatory Visit: Payer: Self-pay | Admitting: Neurology

## 2016-04-13 DIAGNOSIS — G4761 Periodic limb movement disorder: Secondary | ICD-10-CM

## 2016-04-13 DIAGNOSIS — G2581 Restless legs syndrome: Secondary | ICD-10-CM

## 2016-04-13 DIAGNOSIS — R635 Abnormal weight gain: Secondary | ICD-10-CM

## 2016-04-13 DIAGNOSIS — H6982 Other specified disorders of Eustachian tube, left ear: Secondary | ICD-10-CM

## 2016-04-13 DIAGNOSIS — J309 Allergic rhinitis, unspecified: Secondary | ICD-10-CM

## 2016-05-07 ENCOUNTER — Other Ambulatory Visit: Payer: Self-pay | Admitting: Family Medicine

## 2016-05-07 DIAGNOSIS — I10 Essential (primary) hypertension: Secondary | ICD-10-CM

## 2016-05-07 DIAGNOSIS — R300 Dysuria: Secondary | ICD-10-CM

## 2016-05-08 NOTE — Telephone Encounter (Signed)
Needs office visit.

## 2016-05-14 ENCOUNTER — Telehealth: Payer: Self-pay | Admitting: Family Medicine

## 2016-05-14 DIAGNOSIS — R300 Dysuria: Secondary | ICD-10-CM

## 2016-05-14 NOTE — Telephone Encounter (Signed)
Pt is requesting a refillon Diflucan she states that the first pill didn't help and would like another refill please please send to [facility] on [address]

## 2016-05-15 NOTE — Telephone Encounter (Signed)
Informed pt that since her symptoms are persisting she needs to RTC, based on your plan notes. Pt states that she cannot afford an office visit at this time and would not be able to come in until Friday at the earliest. She does not want to wait that long for treatment. Please advise.

## 2016-05-15 NOTE — Telephone Encounter (Signed)
Pt was seen by Dr. Alwyn RenHopper 2 mos prior for dysuria with some mild abd bloating and urinary freq.  UA was normal. She was treated presumptively with a diflucan x 1.    If the diflucan did not initially work, there is no reason to think that a second dose would work.  If the initial one didn't work, it is because her sxs were not due to a vaginal yeast infection.  What are pt's sxs now?  Have put in future orders for repeat UA and a self-collect wet prep which she can do as a lab only visit. Hopefully there is not a copay for this (though if for some reason there is, then she should just go ahead and be seen by a provider instead.)

## 2016-05-16 ENCOUNTER — Other Ambulatory Visit (INDEPENDENT_AMBULATORY_CARE_PROVIDER_SITE_OTHER): Payer: 59 | Admitting: Family Medicine

## 2016-05-16 DIAGNOSIS — R300 Dysuria: Secondary | ICD-10-CM

## 2016-05-16 LAB — POC MICROSCOPIC URINALYSIS (UMFC)

## 2016-05-16 LAB — POCT URINALYSIS DIP (MANUAL ENTRY)
Bilirubin, UA: NEGATIVE
GLUCOSE UA: NEGATIVE
Ketones, POC UA: NEGATIVE
Leukocytes, UA: NEGATIVE
NITRITE UA: NEGATIVE
PROTEIN UA: NEGATIVE
SPEC GRAV UA: 1.025
UROBILINOGEN UA: 0.2
pH, UA: 6

## 2016-05-16 LAB — POCT WET + KOH PREP
Trich by wet prep: ABSENT
Yeast by KOH: ABSENT
Yeast by wet prep: ABSENT

## 2016-05-16 MED ORDER — FLUCONAZOLE 150 MG PO TABS: 150.0000 mg | ORAL_TABLET | Freq: Once | ORAL | 0 refills | Status: AC

## 2016-05-16 NOTE — Telephone Encounter (Incomplete)
Pt states her sxs are vaginal itching, irritation  and burning (NOT while urinating) , "typical yeast infection". Pt states she is very prone to yeast infections. No discharge.  She states the diflucan did work but she believes she has another yeast infection. She states she usually sees dr Ernestina Pennafogleman at [facility] for her frequent yeast infections but cannot go there due to debt.

## 2016-05-16 NOTE — Telephone Encounter (Signed)
Attempted to call pt, unable to leave VM due to full VM

## 2016-05-16 NOTE — Telephone Encounter (Signed)
Great, thanks.  Sxs sound consistent with vaginal yeast infection. Lab orders entered. Did go ahead and send a diflucan in to pharmacy since sxs consistent. In future, if pt is unable to come in, I would recommend she try otc vaginal monistat which is just as effective as diflucan.

## 2016-05-16 NOTE — Telephone Encounter (Signed)
Pt states her sxs are vaginal itching, irritation  and burning (NOT while urinating) , "typical yeast infection". Pt states she is very prone to yeast infections. No discharge.  She states the diflucan did work but she believes she has another yeast infection. She states she usually sees dr Ernestina Pennafogleman at [facility] for her frequent yeast infections but cannot go there due to debt.   That being said, pt agreed to come in for lab only visit. Please put in orders. Thank you.

## 2016-05-17 ENCOUNTER — Other Ambulatory Visit: Payer: Self-pay

## 2016-05-17 MED ORDER — METRONIDAZOLE 500 MG PO TABS: 500.0000 mg | ORAL_TABLET | Freq: Two times a day (BID) | ORAL | 0 refills | Status: DC

## 2016-05-17 NOTE — Telephone Encounter (Signed)
Tried to leave message mailbox full

## 2016-05-18 ENCOUNTER — Telehealth: Payer: Self-pay

## 2016-05-18 NOTE — Telephone Encounter (Signed)
Patient is calling because she was recently prescribed flagyl and is experiencing nasea, sickness on stomach, pain, and trouble eating. Patient would like to know if something else can be prescribed. She states that this happened before when it was prescribed by another provider. Please advise!  [phone]

## 2016-05-19 MED ORDER — METRONIDAZOLE 0.75 % VA GEL: 1.0000 | Freq: Two times a day (BID) | VAGINAL | 0 refills | Status: DC

## 2016-05-19 NOTE — Telephone Encounter (Signed)
Pt not tolerating po Metronidazole. I have sent in Metrogel vaginal suppository.  Please advise patient.

## 2016-05-19 NOTE — Telephone Encounter (Signed)
This was rx for BV by Dr Katrinka BlazingSmith in Dr Leandro ReasonerShaws absence, please advise.

## 2016-05-20 NOTE — Progress Notes (Signed)
Lab visit only. 

## 2016-05-20 NOTE — Telephone Encounter (Signed)
Perfect thank you all

## 2016-05-21 NOTE — Telephone Encounter (Signed)
Error Attempted to l/m mailbox full

## 2016-05-21 NOTE — Telephone Encounter (Signed)
Spoke with pt - advised rx at pharmacy.  Pt aware her VM box is full.

## 2016-05-21 NOTE — Telephone Encounter (Signed)
Pt. Advised. L/m

## 2016-06-08 ENCOUNTER — Other Ambulatory Visit: Payer: Self-pay | Admitting: Family Medicine

## 2016-06-08 DIAGNOSIS — I10 Essential (primary) hypertension: Secondary | ICD-10-CM

## 2016-06-28 ENCOUNTER — Ambulatory Visit (INDEPENDENT_AMBULATORY_CARE_PROVIDER_SITE_OTHER): Payer: Managed Care, Other (non HMO) | Admitting: Family Medicine

## 2016-06-28 VITALS — BP 138/80 | HR 97 | Temp 99.8°F | Resp 18 | Ht 61.0 in | Wt 195.0 lb

## 2016-06-28 DIAGNOSIS — B001 Herpesviral vesicular dermatitis: Secondary | ICD-10-CM

## 2016-06-28 DIAGNOSIS — I10 Essential (primary) hypertension: Secondary | ICD-10-CM

## 2016-06-28 DIAGNOSIS — R21 Rash and other nonspecific skin eruption: Secondary | ICD-10-CM

## 2016-06-28 DIAGNOSIS — F419 Anxiety disorder, unspecified: Secondary | ICD-10-CM | POA: Diagnosis not present

## 2016-06-28 DIAGNOSIS — N898 Other specified noninflammatory disorders of vagina: Secondary | ICD-10-CM

## 2016-06-28 DIAGNOSIS — E6609 Other obesity due to excess calories: Secondary | ICD-10-CM

## 2016-06-28 DIAGNOSIS — Z6835 Body mass index (BMI) 35.0-35.9, adult: Secondary | ICD-10-CM

## 2016-06-28 DIAGNOSIS — IMO0001 Reserved for inherently not codable concepts without codable children: Secondary | ICD-10-CM

## 2016-06-28 LAB — POCT WET + KOH PREP
TRICH BY WET PREP: ABSENT
YEAST BY KOH: ABSENT
Yeast by wet prep: ABSENT

## 2016-06-28 MED ORDER — NYSTATIN 100000 UNIT/GM EX POWD: Freq: Two times a day (BID) | CUTANEOUS | 1 refills | Status: DC

## 2016-06-28 MED ORDER — VALACYCLOVIR HCL 1 G PO TABS
ORAL_TABLET | ORAL | 0 refills | Status: DC
Start: 2016-06-28 — End: 2016-10-09

## 2016-06-28 MED ORDER — LISINOPRIL-HYDROCHLOROTHIAZIDE 10-12.5 MG PO TABS: 1.0000 | ORAL_TABLET | Freq: Every day | ORAL | 3 refills | Status: DC

## 2016-06-28 MED ORDER — CLOTRIMAZOLE-BETAMETHASONE 1-0.05 % EX CREA: 1.0000 "application " | TOPICAL_CREAM | Freq: Two times a day (BID) | CUTANEOUS | 3 refills | Status: DC

## 2016-06-28 NOTE — Patient Instructions (Signed)
     IF you received an x-ray today, you will receive an invoice from [facility]. Please contact [facility] at [phone] with questions or concerns regarding your invoice.   IF you received labwork today, you will receive an invoice from [facility]. Please contact [facility] at [phone] with questions or concerns regarding your invoice.   Our billing staff will not be able to assist you with questions regarding bills from these companies.  You will be contacted with the lab results as soon as they are available. The fastest way to get your results is to activate your My Chart account. Instructions are located on the last page of this paperwork. If you have not heard from us regarding the results in 2 weeks, please contact this office.     

## 2016-06-28 NOTE — Progress Notes (Signed)
Chief Complaint  Patient presents with  . Medication Refill    Lisinopril  . Rash    Possible Poison [place], Groin Area, Bilateral Legs, Lower Abdomen  . Mouth Lesions    Cold sore , Pt would like valcyclovir    HPI  Itchy Rash Pt reports that she noted a rash behind the knee for a month that is itchy and only in the crease behind the knee.  She states that the same rash is in the groin and has been there for about a week and it worse in the groin  The rash looks like vesicles and she used suppositories for her hemorrhoids to see if that would help. She does not use condoms with intercourse.   Cold Sores She also had cold sores in her nose as a result of the stress.  She states that she was crying a lot this past week.   Hypertension She reports that she needs a refill on her lisinopril. She reports that she is out of the lisinopril.  She states that she is complaint with her medications.  She denies chest pain, palpitations and shortness of breath.  She occasionally has swelling in her legs but the blood pressure pill helps with the swelling.  BP Readings from Last 3 Encounters:  06/28/16 138/80  03/21/16 126/78  09/15/15 (!) 94/52    Obesity- pt reports that she has been drinking more sweat tea and eating honey buns.  She had a banana sandwich with bread, banana and mayonnaise and honey nut cheerios She eats grilled chicken salads at times. 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  She reports that she reports that yesterday all day she only at a granola bar.  Wt Readings from Last 3 Encounters:  06/28/16 195 lb (88.5 kg)  03/21/16 192 lb (87.1 kg)  09/14/15 194 lb (88 kg)  Body mass index is 36.84 kg/m.    Past Medical History:  Diagnosis Date  . Allergy   . Anxiety   . Complication of anesthesia   . Depression   . Elevated white blood cell count   . Elevated white blood cell count 09/25/2011  . GERD (gastroesophageal reflux disease)   . Headache    Migraine  . Hypertension   .  Kidney stone   . Neuromuscular disorder (HCC)    carpel tunnel disease  . PONV (postoperative nausea and vomiting)   . Restless leg syndrome   . Shortness of breath dyspnea    with exertion  . Sleep apnea    does not have CPAP machine, mostly affects back sleeping was encouraged to sleep on her sides    Current Outpatient Prescriptions  Medication Sig Dispense Refill  . buPROPion (WELLBUTRIN XL) 300 MG 24 hr tablet Reported on 06/07/2015  3  . cetirizine (ZYRTEC) 10 MG tablet TAKE 1 TABLET(10 MG) BY MOUTH DAILY 30 tablet 0  . citalopram (CELEXA) 20 MG tablet Take 10 mg by mouth daily.     Marland Kitchen escitalopram (LEXAPRO) 10 MG tablet TK 1 T PO QD  5  . folic acid (FOLVITE) 1 MG tablet Take 1 mg by mouth daily.    . hydrOXYzine (ATARAX/VISTARIL) 25 MG tablet Take 25 mg by mouth 3 (three) times daily as needed for anxiety or vomiting.    Marland Kitchen lisinopril-hydrochlorothiazide (PRINZIDE,ZESTORETIC) 10-12.5 MG tablet Take 1 tablet by mouth daily. 90 tablet 3  . naratriptan (AMERGE) 2.5 MG tablet Take 1 tablet (2.5 mg total) by mouth as needed for migraine. Take one (1)  tablet at onset of headache; if returns or does not resolve, may repeat after 4 hours; do not exceed five (5) mg in 24 hours. 10 tablet 5  . pramipexole (MIRAPEX) 0.125 MG tablet 1 pill each night x 1 week, then 2 pills each night x 1 week, then 3 pills each night thereafter. Take 90-120 minutes before bedtime. 90 tablet 5  . terconazole (TERAZOL 7) 0.4 % vaginal cream Place 1 applicator vaginally 2 (two) times daily. 45 g 0  . topiramate (TOPAMAX) 50 MG tablet Take 1 tablet (50 mg total) by mouth at bedtime. 30 tablet 3  . clotrimazole-betamethasone (LOTRISONE) cream Apply 1 application topically 2 (two) times daily. 30 g 3  . nystatin (MYCOSTATIN/NYSTOP) powder Apply topically 2 (two) times daily. 15 g 1  . ondansetron (ZOFRAN) 4 MG tablet Take 4 mg by mouth every 8 (eight) hours as needed for nausea or vomiting.    . promethazine  (PHENERGAN) 25 MG tablet Take 25 mg by mouth every 6 (six) hours as needed for nausea or vomiting.    . valACYclovir (VALTREX) 1000 MG tablet Take 2 tablets every 12 hours. For one day with outbreak. 20 tablet 0   No current facility-administered medications for this visit.     Allergies:  Allergies  Allergen Reactions  . Bee Venom Hives and Swelling  . Enablex [Darifenacin Hydrobromide] Other (See Comments)    Phlebitis.  . Erythromycin Hives  . Macrobid [Nitrofurantoin Monohyd Macro] Nausea And Vomiting  . Nitrofuran Derivatives Nausea And Vomiting    Past Surgical History:  Procedure Laterality Date  . CHOLECYSTECTOMY    . COLONOSCOPY    . DILATION AND CURETTAGE OF UTERUS  1993  . ECTOPIC PREGNANCY SURGERY    . ESSURE TUBAL LIGATION    . LITHOTRIPSY    . ROBOTIC ASSISTED TOTAL HYSTERECTOMY WITH SALPINGECTOMY Left 09/14/2015   Procedure: ROBOTIC ASSISTED TOTAL HYSTERECTOMY WITH LEFT SALPINGECTOMY;  Surgeon: Noland Fordyce, MD;  Location: [facility];  Service: Gynecology;  Laterality: Left;  . UPPER GI ENDOSCOPY    . WISDOM TOOTH EXTRACTION      Social History   Social History  . Marital status: Divorced    Spouse name: N/A  . Number of children: N/A  . Years of education: N/A   Social History Main Topics  . Smoking status: Never Smoker  . Smokeless tobacco: Never Used  . Alcohol use No  . Drug use: No  . Sexual activity: Yes   Other Topics Concern  . None   Social History Narrative  . None    Review of Systems  Constitutional: Negative for chills, fever and weight loss.  HENT: Negative for congestion, sinus pain, sore throat and tinnitus.   Eyes: Negative for blurred vision and double vision.  Respiratory: Negative for cough, sputum production, shortness of breath and wheezing.   Cardiovascular: Negative for chest pain, palpitations and orthopnea.  Gastrointestinal: Negative for abdominal pain, nausea and vomiting.  Genitourinary: Negative for dysuria,  frequency and urgency.  Musculoskeletal: Negative for myalgias and neck pain.  Skin: Positive for itching and rash.  Neurological: Negative for dizziness, tingling, tremors and headaches.  Endo/Heme/Allergies:       +polyphagia  Psychiatric/Behavioral: The patient is nervous/anxious.     Objective: Vitals:   06/28/16 0853  BP: 138/80  Pulse: 97  Resp: 18  Temp: 99.8 F (37.7 C)  TempSrc: Oral  SpO2: 98%  Weight: 195 lb (88.5 kg)  Height: 5\' 1"  (1.549 m)  Physical Exam  Constitutional: She is oriented to person, place, and time. She appears well-developed and well-nourished.  HENT:  Head: Normocephalic and atraumatic.  Nose: Nose normal.  Mouth/Throat: Oropharynx is clear and moist.  Eyes: Conjunctivae and EOM are normal.  Neck: Normal range of motion. Neck supple.  Cardiovascular: Normal rate, regular rhythm and normal heart sounds.   No murmur heard. Pulmonary/Chest: Effort normal and breath sounds normal. No respiratory distress. She has no wheezes.  Abdominal: Soft. Bowel sounds are normal. She exhibits no distension. There is no tenderness.  Musculoskeletal: Normal range of motion. She exhibits no edema.  Neurological: She is alert and oriented to person, place, and time. She displays normal reflexes. No cranial nerve deficit or sensory deficit. She exhibits normal muscle tone. Coordination normal.  Skin: Skin is warm. Capillary refill takes less than 2 seconds. No erythema.  Psychiatric: Her speech is normal and behavior is normal. Judgment and thought content normal. Her mood appears anxious. Her affect is not angry, not blunt, not labile and not inappropriate. She is not actively hallucinating. Cognition and memory are normal. She does not exhibit a depressed mood. She is attentive.    Assessment and Plan Helen RowerLiesa was seen today for medication refill, rash and mouth lesions.  Diagnoses and all orders for this visit:  Essential hypertension- discussed medication  monitoring  Discussed continued medications -     Basic metabolic panel -     Microalbumin, urine -     lisinopril-hydrochlorothiazide (PRINZIDE,ZESTORETIC) 10-12.5 MG tablet; Take 1 tablet by mouth daily.  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  Other orders -     clotrimazole-betamethasone (LOTRISONE) cream; Apply 1 application topically 2 (two) times daily. -     nystatin (MYCOSTATIN/NYSTOP) powder; Apply topically 2 (two) times daily. -     valACYclovir (VALTREX) 1000 MG tablet; Take 2 tablets every 12 hours. For one day with outbreak.   A total of 45 minutes were spent face-to-face with the patient during this encounter and over half of that time was spent on counseling and coordination of care.   Helen Greene A Anaise Sterbenz

## 2016-06-29 ENCOUNTER — Other Ambulatory Visit: Payer: Self-pay | Admitting: Family Medicine

## 2016-06-29 LAB — HEMOGLOBIN A1C
Est. average glucose Bld gHb Est-mCnc: 108 mg/dL
HEMOGLOBIN A1C: 5.4 % (ref 4.8–5.6)

## 2016-06-29 LAB — LIPID PANEL
Chol/HDL Ratio: 5.8 ratio units — ABNORMAL HIGH (ref 0.0–4.4)
Cholesterol, Total: 265 mg/dL — ABNORMAL HIGH (ref 100–199)
HDL: 46 mg/dL (ref >39)
LDL Calculated: 160 mg/dL — ABNORMAL HIGH (ref 0–99)
Triglycerides: 297 mg/dL — ABNORMAL HIGH (ref 0–149)
VLDL Cholesterol Cal: 59 mg/dL — ABNORMAL HIGH (ref 5–40)

## 2016-06-29 MED ORDER — PRAVASTATIN SODIUM 20 MG PO TABS: 20.0000 mg | ORAL_TABLET | Freq: Every day | ORAL | 3 refills | Status: DC

## 2016-07-01 LAB — BASIC METABOLIC PANEL
BUN/Creatinine Ratio: 13 (ref 9–23)
BUN: 13 mg/dL (ref 6–24)
CO2: 24 mmol/L (ref 18–29)
Calcium: 9.9 mg/dL (ref 8.7–10.2)
Chloride: 101 mmol/L (ref 96–106)
Creatinine, Ser: 1 mg/dL (ref 0.57–1.00)
GFR calc Af Amer: 78 mL/min/{1.73_m2} (ref >59)
GFR calc non Af Amer: 68 mL/min/{1.73_m2} (ref >59)
GLUCOSE: 93 mg/dL (ref 65–99)
POTASSIUM: 4.6 mmol/L (ref 3.5–5.2)
SODIUM: 141 mmol/L (ref 134–144)

## 2016-07-01 LAB — MICROALBUMIN, URINE

## 2016-08-28 ENCOUNTER — Encounter: Payer: Self-pay | Admitting: Family Medicine

## 2016-08-28 ENCOUNTER — Ambulatory Visit (INDEPENDENT_AMBULATORY_CARE_PROVIDER_SITE_OTHER): Payer: 59 | Admitting: Family Medicine

## 2016-08-28 VITALS — BP 117/76 | HR 93 | Temp 98.9°F | Resp 16 | Ht 61.0 in | Wt 199.4 lb

## 2016-08-28 DIAGNOSIS — Z113 Encounter for screening for infections with a predominantly sexual mode of transmission: Secondary | ICD-10-CM

## 2016-08-28 DIAGNOSIS — N3946 Mixed incontinence: Secondary | ICD-10-CM | POA: Diagnosis not present

## 2016-08-28 DIAGNOSIS — Z6837 Body mass index (BMI) 37.0-37.9, adult: Secondary | ICD-10-CM | POA: Diagnosis not present

## 2016-08-28 DIAGNOSIS — E6609 Other obesity due to excess calories: Secondary | ICD-10-CM

## 2016-08-28 DIAGNOSIS — R350 Frequency of micturition: Secondary | ICD-10-CM

## 2016-08-28 DIAGNOSIS — E66812 Obesity, class 2: Secondary | ICD-10-CM

## 2016-08-28 LAB — POCT URINALYSIS DIP (MANUAL ENTRY)
BILIRUBIN UA: NEGATIVE
Glucose, UA: NEGATIVE
Ketones, POC UA: NEGATIVE
LEUKOCYTES UA: NEGATIVE
NITRITE UA: NEGATIVE
Protein Ur, POC: NEGATIVE
RBC UA: NEGATIVE
Spec Grav, UA: 1.025
Urobilinogen, UA: 0.2
pH, UA: 7

## 2016-08-28 NOTE — Progress Notes (Signed)
Chief Complaint  Patient presents with  . Urinary Tract Infection    frequent urination, several sporadic episodes, sneezed and voided    HPI   Obesity- she reports that she craves sweets and struggles with her weight. She does not exercise. She has not joined any programs. She is not cutting calories. Her weight is continuing to rise.  Wt Readings from Last 3 Encounters:  08/28/16 199 lb 6.4 oz (90.4 kg)  06/28/16 195 lb (88.5 kg)  03/21/16 192 lb (87.1 kg)   Pt reports that a week ago she was having intercourse and had a break in the condom and would like gonorrhea and chlamydia testing. She does not "want the rest right now" only the gonorrhea and chlamydia.  Also during that episode of intercourse she was stimulated and felt the urge to urinate. During coitus she empties her bladder and could not stop herself.  This happened twice. She wanted to know if she has a bladder infection. She had a hysterectomy a year ago. There is no urinary pain. She reports that her urologist previously referred her to pelvic floor rehab and also to do kegel exercises.  She reports that she does not know how to kegel.    Past Medical History:  Diagnosis Date  . Allergy   . Anxiety   . Complication of anesthesia   . Depression   . Elevated white blood cell count   . Elevated white blood cell count 09/25/2011  . GERD (gastroesophageal reflux disease)   . Headache    Migraine  . Hypertension   . Kidney stone   . Neuromuscular disorder (HCC)    carpel tunnel disease  . PONV (postoperative nausea and vomiting)   . Restless leg syndrome   . Shortness of breath dyspnea    with exertion  . Sleep apnea    does not have CPAP machine, mostly affects back sleeping was encouraged to sleep on her sides    Current Outpatient Prescriptions  Medication Sig Dispense Refill  . buPROPion (WELLBUTRIN XL) 300 MG 24 hr tablet Reported on 06/07/2015  3  . cetirizine (ZYRTEC) 10 MG tablet TAKE 1 TABLET(10 MG) BY  MOUTH DAILY 30 tablet 0  . citalopram (CELEXA) 20 MG tablet Take 10 mg by mouth daily.     . clotrimazole-betamethasone (LOTRISONE) cream Apply 1 application topically 2 (two) times daily. 30 g 3  . escitalopram (LEXAPRO) 10 MG tablet TK 1 T PO QD  5  . folic acid (FOLVITE) 1 MG tablet Take 1 mg by mouth daily.    . hydrOXYzine (ATARAX/VISTARIL) 25 MG tablet Take 25 mg by mouth 3 (three) times daily as needed for anxiety or vomiting.    Marland Kitchen lisinopril-hydrochlorothiazide (PRINZIDE,ZESTORETIC) 10-12.5 MG tablet Take 1 tablet by mouth daily. 90 tablet 3  . naratriptan (AMERGE) 2.5 MG tablet Take 1 tablet (2.5 mg total) by mouth as needed for migraine. Take one (1) tablet at onset of headache; if returns or does not resolve, may repeat after 4 hours; do not exceed five (5) mg in 24 hours. 10 tablet 5  . nystatin (MYCOSTATIN/NYSTOP) powder Apply topically 2 (two) times daily. 15 g 1  . ondansetron (ZOFRAN) 4 MG tablet Take 4 mg by mouth every 8 (eight) hours as needed for nausea or vomiting.    . pramipexole (MIRAPEX) 0.125 MG tablet 1 pill each night x 1 week, then 2 pills each night x 1 week, then 3 pills each night thereafter. Take 90-120 minutes before  bedtime. 90 tablet 5  . pravastatin (PRAVACHOL) 20 MG tablet Take 1 tablet (20 mg total) by mouth daily. 90 tablet 3  . promethazine (PHENERGAN) 25 MG tablet Take 25 mg by mouth every 6 (six) hours as needed for nausea or vomiting.    Marland Kitchen. terconazole (TERAZOL 7) 0.4 % vaginal cream Place 1 applicator vaginally 2 (two) times daily. 45 g 0  . topiramate (TOPAMAX) 50 MG tablet Take 1 tablet (50 mg total) by mouth at bedtime. 30 tablet 3  . valACYclovir (VALTREX) 1000 MG tablet Take 2 tablets every 12 hours. For one day with outbreak. 20 tablet 0   No current facility-administered medications for this visit.     Allergies:  Allergies  Allergen Reactions  . Bee Venom Hives and Swelling  . Enablex [Darifenacin Hydrobromide] Other (See Comments)     Phlebitis.  . Erythromycin Hives  . Macrobid [Nitrofurantoin Monohyd Macro] Nausea And Vomiting  . Nitrofuran Derivatives Nausea And Vomiting    Past Surgical History:  Procedure Laterality Date  . CHOLECYSTECTOMY    . COLONOSCOPY    . DILATION AND CURETTAGE OF UTERUS  1993  . ECTOPIC PREGNANCY SURGERY    . ESSURE TUBAL LIGATION    . LITHOTRIPSY    . ROBOTIC ASSISTED TOTAL HYSTERECTOMY WITH SALPINGECTOMY Left 09/14/2015   Procedure: ROBOTIC ASSISTED TOTAL HYSTERECTOMY WITH LEFT SALPINGECTOMY;  Surgeon: Noland FordyceKelly Fogleman, MD;  Location: [facility];  Service: Gynecology;  Laterality: Left;  . UPPER GI ENDOSCOPY    . WISDOM TOOTH EXTRACTION      Social History   Social History  . Marital status: Divorced    Spouse name: N/A  . Number of children: N/A  . Years of education: N/A   Social History Main Topics  . Smoking status: Never Smoker  . Smokeless tobacco: Never Used  . Alcohol use No  . Drug use: No  . Sexual activity: Yes   Other Topics Concern  . None   Social History Narrative  . None    Review of Systems  Gastrointestinal: Negative for abdominal pain, nausea and vomiting.  Genitourinary: Positive for urgency. Negative for dysuria, frequency and hematuria.  Skin: Negative for itching and rash.    Objective: Vitals:   08/28/16 1743  BP: 117/76  Pulse: 93  Resp: 16  Temp: 98.9 F (37.2 C)  TempSrc: Oral  SpO2: 97%  Weight: 199 lb 6.4 oz (90.4 kg)  Height: 5\' 1"  (1.549 m)  Body mass index is 37.68 kg/m.   Physical Exam  Constitutional: She is oriented to person, place, and time. She appears well-developed and well-nourished.  Cardiovascular: Normal rate, regular rhythm and normal heart sounds.   Pulmonary/Chest: Effort normal and breath sounds normal. No respiratory distress. She has no wheezes.  Neurological: She is alert and oriented to person, place, and time.     Component     Latest Ref Rng & Units 08/28/2016 08/28/2016         5:49 PM  5:49 PM    Color, UA     yellow yellow   Clarity, UA     clear clear   Glucose     negative negative   Bilirubin, UA     negative negative negative  Specific Gravity, UA     1.003, 1.005, 1.010, 1.015, 1.020, 1.025, 1.030, 1.035 1.025   RBC, UA     negative negative   pH, UA     5.0, 5.5, 6.0, 6.5, 7.0, 7.5, 8.0 7.0  Protein, UA     negative negative   Urobilinogen, UA     0.2, 1.0, negative 0.2   Nitrite, UA     Negative Negative   Leukocytes, UA     Negative Negative    Assessment and Plan Meliss was seen today for urinary tract infection.  Diagnoses and all orders for this visit:  Urine frequency- no uti -     POCT urinalysis dipstick  Screen for STD (sexually transmitted disease)- pt declined HIV screen -     GC/Chlamydia Probe Amp  Mixed stress and urge urinary incontinence - discussed follow up with Urology for another referral to Pelvic Floor Rehab Discussed how pelvic floor rehab works Discussed the benefits of kegel exercises Tried to have pt gain an understanding of which muscles are most affected  Class 2 obesity due to excess calories without serious comorbidity with body mass index (BMI) of 37.0 to 37.9 in adult - advised pt to joint weight watchers To stop eating sweets      Jenissa Tyrell A Creta Levin

## 2016-08-28 NOTE — Patient Instructions (Addendum)
IF you received an x-ray today, you will receive an invoice from [facility]. Please contact [facility] at [phone] with questions or concerns regarding your invoice.   IF you received labwork today, you will receive an invoice from [facility]. Please contact [facility] at [phone] with questions or concerns regarding your invoice.   Our billing staff will not be able to assist you with questions regarding bills from these companies.  You will be contacted with the lab results as soon as they are available. The fastest way to get your results is to activate your My Chart account. Instructions are located on the last page of this paperwork. If you have not heard from [place] regarding the results in 2 weeks, please contact this office.      Preventing Sexually Transmitted Infections, Adult Sexually transmitted infections (STIs) are diseases that are passed (transmitted) from person to person through bodily fluids exchanged during sex or sexual contact. Bodily fluids include saliva, semen, blood, vaginal mucus, and urine. You may have an increased risk for developing an STI if you have unprotected oral, vaginal, or anal sex. Some common STIs include:  Herpes.  Hepatitis B.  Chlamydia.  Gonorrhea.  Syphilis.  HPV (human papillomavirus).  HIV (humanimmunodeficiency virus), the virus that can cause AIDS (acquired immunodeficiency virus). How can I protect myself from sexually transmitted infections? The only way to completely prevent STIs is not to have sex of any kind (practice abstinence). This includes oral, vaginal, or anal sex. If you are sexually active, take these actions to lower your risk of getting an STI:  Have only one sex partner (be monogamous) or limit the number of sexual partners you have.  Stay up-to-date on immunizations. Certain vaccines can lower your risk of getting certain STIs, such as:  Hepatitis A and B vaccines. You may have been  vaccinated as a young child, but likely need a booster shot as a teen or young adult.  HPV vaccine. This vaccine is recommended if you are a man under age 85 or a woman under age 34.  Use methods that prevent the exchange of body fluids between partners (barrier protection) every time you have sex. Barrier protection can be used during oral, vaginal, or anal sex. Commonly used barrier methods include:  Female condom.  Female condom.  Dental dam.  Get tested regularly for STIs. Have your sexual partner get tested regularly as well.  Avoid mixing alcohol, drugs, and sex. Alcohol and drug use can affect your ability to make good decisions and can lead to risky sexual behaviors.  Ask your health care provider about taking pre-exposure prophylaxis (PrEP) to prevent HIV infection if you:  Have a HIV-positive sexual partner.  Have multiple sexual partners or partners who do not know their HIV status, and do not regularly use a condom during sex.  Use injection drugs and share needles. Birth control pills, injections, implants, and intrauterine devices (IUDs) do not protect against STIs. To prevent both STIs and pregnancy, always use a condom with another form of birth control. Some STIs, such as herpes, are spread through skin to skin contact. A condom does not protect you from getting such STIs. If you or your partner have herpes and there is an active flare with open sores, avoid all sexual contact. Why are these changes important? Taking steps to practice safe sex protects you and others. Many STIs can be cured. However, some STIs are not curable and will affect you for the rest  of your life. STIs can be passed on to another person even if you do not have symptoms. What can happen if changes are not made? Certain STIs may:  Require you to take medicine for the rest of your life.  Affect your ability to have children (your fertility).  Increase your risk for developing another STI or  certain serious health conditions, such as:  Cervical cancer.  Head and neck cancer.  Pelvic inflammatory disease (PID) in women.  Organ damage or damage to other parts of your body, if the infection spreads.  Be passed to a baby during childbirth. How are sexually transmitted infections treated? If you or your partner know or think that you may have an STI:  Talk with your healthcare provider about what can be done to treat it. Some STIs can be treated and cured with medicines.  For curable STIs, you and your partner should avoid sex during treatment and for several days after treatment is complete.  You and your partner should both be treated at the same time, if there is any chance that your partner is infected as well. If you get treatment but your partner does not, your partner can re-infect you when you resume sexual contact.  Do not have unprotected sex. Where to find more information: Learn more about sexually transmitted diseases and infections from:  Centers for Disease Control and Prevention:  More information about specific STIs: SolutionApps.co.zawww.cdc.gov/std  Find places to get sexual health counseling and treatment for free or for a low cost: gettested.TonerPromos.nocdc.gov  U.S. Department of Health and Human Services: NotebookPreviews.siwww.womenshealth.gov/publications/our-publications/fact-sheet/sexually-transmitted-infections.html Summary  The only way to completely prevent STIs is not to have sex (practice abstinence), including oral, vaginal, or anal sex.  STIs can spread through saliva, semen, blood, vaginal mucus, urine, or sexual contact.  If you do have sex, limit your number of sexual partners and use a barrier protection method every time you have sex.  If you develop an STI, get treated right away and ask your partner to be treated as well. Do not resume having sex until both of you have completed treatment for the STI. This information is not intended to replace advice given to you by your  health care provider. Make sure you discuss any questions you have with your health care provider. Document Released: 05/30/2016 Document Revised: 05/30/2016 Document Reviewed: 05/30/2016 Elsevier Interactive Patient Education  2017 [employer]

## 2016-08-30 ENCOUNTER — Telehealth: Payer: Self-pay | Admitting: Family Medicine

## 2016-08-30 LAB — SPECIMEN STATUS REPORT

## 2016-08-30 LAB — GC/CHLAMYDIA PROBE AMP
Chlamydia trachomatis, NAA: NEGATIVE
Neisseria gonorrhoeae by PCR: NEGATIVE

## 2016-08-30 NOTE — Telephone Encounter (Signed)
Pt called needing something for a yeast infection fluconazole.. Pt uses [facility] on [address]    Please advise pt: [phone]

## 2016-09-02 ENCOUNTER — Ambulatory Visit (INDEPENDENT_AMBULATORY_CARE_PROVIDER_SITE_OTHER): Payer: 59 | Admitting: Family Medicine

## 2016-09-02 ENCOUNTER — Ambulatory Visit: Payer: 59

## 2016-09-02 VITALS — BP 114/79 | HR 89 | Temp 98.1°F | Ht 61.0 in | Wt 199.8 lb

## 2016-09-02 DIAGNOSIS — B977 Papillomavirus as the cause of diseases classified elsewhere: Secondary | ICD-10-CM

## 2016-09-02 DIAGNOSIS — Z711 Person with feared health complaint in whom no diagnosis is made: Secondary | ICD-10-CM | POA: Diagnosis not present

## 2016-09-02 NOTE — Patient Instructions (Addendum)
IF you received an x-ray today, you will receive an invoice from [facility]. Please contact [facility] at [phone] with questions or concerns regarding your invoice.   IF you received labwork today, you will receive an invoice from [place] [facility]. Please contact [facility] at [phone] with questions or concerns regarding your invoice.   Our billing staff will not be able to assist you with questions regarding bills from these companies.  You will be contacted with the lab results as soon as they are available. The fastest way to get your results is to activate your My Chart account. Instructions are located on the last page of this paperwork. If you have not heard from [place] regarding the results in 2 weeks, please contact this office.      Human Papillomavirus Human papillomavirus (HPV) is the most common sexually transmitted infection (STI). It easily spreads from person to person (is highly contagious). HPV infections cause genital warts. Certain types of HPV may cause cancers, including cancer of the lower part of the uterus (cervix), vagina, outer female genital area (vulva), penis, anus, and rectum. HPV may also cause cancers of the oral cavity, such as the throat, tongue, and tonsils. There are many types of HPV. It usually does not cause symptoms. However, sometimes there are wart-like lesions in the throat or warts in the genital area that you can see or feel. It is possible to be infected for long periods and pass HPV to others without knowing it. What are the causes? HPV is caused by a virus that spreads from person to person through sexual contact. This includes oral, vaginal, or anal sex. What increases the risk? The following factors may make you more likely to develop this condition:  Having unprotected oral, vaginal, or anal sex.  Having several sex partners.  Having a sex partner who has other sex partners.  Having or having had another  STI.  Having a weak disease-fighting (immune) system.  Having damaged skin in the genital area. What are the signs or symptoms? Most people who have HPV do not have any symptoms. If symptoms are present, they may include:  Wartlike lesions in the throat (from having oral sex).  Warts on the infected skin or mucous membranes.  Genital warts that may itch, burn, bleed, or be painful during sexual intercourse. How is this diagnosed? If wartlike lesions are present in the throat or if genital warts are present, your health care provider can usually diagnose HPV with a physical exam. Genital warts are easily seen. In females, tests may be used to diagnose HPV, including:  A Pap test. A Pap test takes a sample of cells from your cervix to check for cancer and HPV infection.  An HPV test. This is similar to a Pap test and involves taking a sample of cells from your cervix.  Using a scope to view the cervix (colposcopy). This may be done if a pelvic exam or Pap test is abnormal. A sample of tissue may be removed for testing (biopsy) during the colposcopy. Currently, there is no test to detect HPV in males. How is this treated? There is no treatment for the virus itself. However, there are treatments for the health problems and symptoms HPV can cause. Your health care provider will monitor you closely after you are treated as HPV can come back and may need treatment again. Treatment for HPV may include:  Medicines, which may be injected or applied to genital warts in a cream,  lotion, liquid or gel form.  Use of a probe to apply extreme cold (cryotherapy) to the genital warts.  Application of an intense beam of light (laser treatment) on the genital warts.  Use of a probe to apply extreme heat (electrocautery) on the genital warts.  Surgery to remove the genital warts. Follow these instructions at home: Medicines   Take over-the-counter and prescription medicines only as told by your  health care provider. This include creams for itching or irritation.  Do not treat genital warts with medicines used for treating hand warts. General instructions   Do not touch or scratch the warts.  Do not have sex while you are being treated.  Do not douche or use tampons during treatment (women).  Tell your sex partner about your infection. He or she may also need to be treated.  If you become pregnant, tell your health care provider that you have HPV. Your health care provider will monitor you closely during pregnancy to make sure your baby is safe.  Keep all follow-up visits as told by your health care provider. This is important. How is this prevented?  Talk with your health care provider about getting the HPV vaccines. These vaccines prevent some HPV infections and cancers. The vaccines are recommended for males and females between the ages of 229 and 3626. They will not work if you already have HPV, and they are not recommended for pregnant women.  After treatment, use condoms during sex to prevent future infections.  Have only one sex partner.  Have a sex partner who does not have other sex partners.  Get regular Pap tests as directed by your health care provider. Contact a health care provider if:  The treated skin becomes red, swollen, or painful.  You have a fever.  You feel generally ill.  You feel lumps or pimples sticking out in and around your genital area.  You develop bleeding of the vagina or the treatment area.  You have painful sexual intercourse. Summary  Human papillomavirus (HPV) is the most common sexually transmitted infection (STI) and is highly contagious.  Most people carrying HPV do not have any symptoms.  HPV can be prevented with vaccination. The vaccine is recommended for males and females between the ages of 379 and 5426.  There is no treatment for the virus itself. However, there are treatments for the health problems and symptoms HPV can  cause. This information is not intended to replace advice given to you by your health care provider. Make sure you discuss any questions you have with your health care provider. Document Released: 08/24/2003 Document Revised: 05/12/2016 Document Reviewed: 05/12/2016 Elsevier Interactive Patient Education  2017 [employer]

## 2016-09-02 NOTE — Progress Notes (Signed)
Chief Complaint  Patient presents with  . Vaginal Itching    X 1 week  . Vaginal Discharge    X 1 week  . Vaginal Bump    HPI   Pt reports that she had bumps on her vagina She reports that she was worried about HPV She would like me to look at the  She reports that it bothers her that she has to tell her sexual partners about her HPV status She is wondering what treatment can eradicate her hpv She is up to date on her pap smears   Past Medical History:  Diagnosis Date  . Allergy   . Anxiety   . Complication of anesthesia   . Depression   . Elevated white blood cell count   . Elevated white blood cell count 09/25/2011  . GERD (gastroesophageal reflux disease)   . Headache    Migraine  . Hypertension   . Kidney stone   . Neuromuscular disorder (HCC)    carpel tunnel disease  . PONV (postoperative nausea and vomiting)   . Restless leg syndrome   . Shortness of breath dyspnea    with exertion  . Sleep apnea    does not have CPAP machine, mostly affects back sleeping was encouraged to sleep on her sides    Current Outpatient Prescriptions  Medication Sig Dispense Refill  . buPROPion (WELLBUTRIN XL) 300 MG 24 hr tablet Reported on 06/07/2015  3  . cetirizine (ZYRTEC) 10 MG tablet TAKE 1 TABLET(10 MG) BY MOUTH DAILY 30 tablet 0  . citalopram (CELEXA) 20 MG tablet Take 10 mg by mouth daily.     . clotrimazole-betamethasone (LOTRISONE) cream Apply 1 application topically 2 (two) times daily. 30 g 3  . escitalopram (LEXAPRO) 10 MG tablet TK 1 T PO QD  5  . folic acid (FOLVITE) 1 MG tablet Take 1 mg by mouth daily.    . hydrOXYzine (ATARAX/VISTARIL) 25 MG tablet Take 25 mg by mouth 3 (three) times daily as needed for anxiety or vomiting.    Marland Kitchen. lisinopril-hydrochlorothiazide (PRINZIDE,ZESTORETIC) 10-12.5 MG tablet Take 1 tablet by mouth daily. 90 tablet 3  . naratriptan (AMERGE) 2.5 MG tablet Take 1 tablet (2.5 mg total) by mouth as needed for migraine. Take one (1) tablet at  onset of headache; if returns or does not resolve, may repeat after 4 hours; do not exceed five (5) mg in 24 hours. 10 tablet 5  . nystatin (MYCOSTATIN/NYSTOP) powder Apply topically 2 (two) times daily. 15 g 1  . ondansetron (ZOFRAN) 4 MG tablet Take 4 mg by mouth every 8 (eight) hours as needed for nausea or vomiting.    . pramipexole (MIRAPEX) 0.125 MG tablet 1 pill each night x 1 week, then 2 pills each night x 1 week, then 3 pills each night thereafter. Take 90-120 minutes before bedtime. 90 tablet 5  . pravastatin (PRAVACHOL) 20 MG tablet Take 1 tablet (20 mg total) by mouth daily. 90 tablet 3  . promethazine (PHENERGAN) 25 MG tablet Take 25 mg by mouth every 6 (six) hours as needed for nausea or vomiting.    Marland Kitchen. terconazole (TERAZOL 7) 0.4 % vaginal cream Place 1 applicator vaginally 2 (two) times daily. 45 g 0  . topiramate (TOPAMAX) 50 MG tablet Take 1 tablet (50 mg total) by mouth at bedtime. 30 tablet 3  . valACYclovir (VALTREX) 1000 MG tablet Take 2 tablets every 12 hours. For one day with outbreak. 20 tablet 0  . fluconazole (DIFLUCAN)  150 MG tablet Take 1 tablet (150 mg total) by mouth daily. Repeat in 3 days 2 tablet 0   No current facility-administered medications for this visit.     Allergies:  Allergies  Allergen Reactions  . Bee Venom Hives and Swelling  . Enablex [Darifenacin Hydrobromide] Other (See Comments)    Phlebitis.  . Erythromycin Hives  . Macrobid [Nitrofurantoin Monohyd Macro] Nausea And Vomiting  . Nitrofuran Derivatives Nausea And Vomiting    Past Surgical History:  Procedure Laterality Date  . CHOLECYSTECTOMY    . COLONOSCOPY    . DILATION AND CURETTAGE OF UTERUS  1993  . ECTOPIC PREGNANCY SURGERY    . ESSURE TUBAL LIGATION    . LITHOTRIPSY    . ROBOTIC ASSISTED TOTAL HYSTERECTOMY WITH SALPINGECTOMY Left 09/14/2015   Procedure: ROBOTIC ASSISTED TOTAL HYSTERECTOMY WITH LEFT SALPINGECTOMY;  Surgeon: Noland Fordyce, MD;  Location: [facility];  Service:  Gynecology;  Laterality: Left;  . UPPER GI ENDOSCOPY    . WISDOM TOOTH EXTRACTION      Social History   Social History  . Marital status: Divorced    Spouse name: N/A  . Number of children: N/A  . Years of education: N/A   Social History Main Topics  . Smoking status: Never Smoker  . Smokeless tobacco: Never Used  . Alcohol use No  . Drug use: No  . Sexual activity: Yes   Other Topics Concern  . None   Social History Narrative  . None    ROS  Objective: Vitals:   09/02/16 1724  BP: 114/79  Pulse: 89  Temp: 98.1 F (36.7 C)  TempSrc: Oral  SpO2: 97%  Weight: 199 lb 12.8 oz (90.6 kg)  Height: 5\' 1"  (1.549 m)    Physical Exam  Gen: alert and oriented Vaginal exam Labia normal bilaterally without skin lesions Non-vesicular   Assessment and Plan Danayah was seen today for vaginal itching, vaginal discharge and vaginal bump.  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      Sam Overbeck A Scout Guyett

## 2016-09-04 NOTE — Telephone Encounter (Signed)
Seen 08/28/16 for std check

## 2016-09-05 MED ORDER — FLUCONAZOLE 150 MG PO TABS: 150.0000 mg | ORAL_TABLET | Freq: Every day | ORAL | 0 refills | Status: DC

## 2016-09-05 NOTE — Telephone Encounter (Signed)
Medication sent in. 

## 2016-09-28 ENCOUNTER — Ambulatory Visit (INDEPENDENT_AMBULATORY_CARE_PROVIDER_SITE_OTHER): Payer: 59 | Admitting: Family Medicine

## 2016-09-28 VITALS — BP 121/85 | HR 101 | Temp 98.3°F | Resp 16 | Ht 59.75 in | Wt 202.0 lb

## 2016-09-28 DIAGNOSIS — E669 Obesity, unspecified: Secondary | ICD-10-CM | POA: Diagnosis not present

## 2016-09-28 DIAGNOSIS — G2581 Restless legs syndrome: Secondary | ICD-10-CM

## 2016-09-28 DIAGNOSIS — F411 Generalized anxiety disorder: Secondary | ICD-10-CM

## 2016-09-28 MED ORDER — PRAMIPEXOLE DIHYDROCHLORIDE 1.5 MG PO TABS: 1.5000 mg | ORAL_TABLET | Freq: Every day | ORAL | 3 refills | Status: DC

## 2016-09-28 MED ORDER — BUPROPION HCL ER (XL) 300 MG PO TB24: 300.0000 mg | ORAL_TABLET | Freq: Every day | ORAL | 0 refills | Status: DC

## 2016-09-28 NOTE — Patient Instructions (Addendum)
   IF you received an x-ray today, you will receive an invoice from [facility]. Please contact [facility] at [phone] with questions or concerns regarding your invoice.   IF you received labwork today, you will receive an invoice from [facility]. Please contact [facility] at [phone] with questions or concerns regarding your invoice.   Our billing staff will not be able to assist you with questions regarding bills from these companies.  You will be contacted with the lab results as soon as they are available. The fastest way to get your results is to activate your My Chart account. Instructions are located on the last page of this paperwork. If you have not heard from us regarding the results in 2 weeks, please contact this office.      Generalized Anxiety Disorder, Adult Generalized anxiety disorder (GAD) is a mental health disorder. People with this condition constantly worry about everyday events. Unlike normal anxiety, worry related to GAD is not triggered by a specific event. These worries also do not fade or get better with time. GAD interferes with life functions, including relationships, work, and school. GAD can vary from mild to severe. People with severe GAD can have intense waves of anxiety with physical symptoms (panic attacks). What are the causes? The exact cause of GAD is not known. What increases the risk? This condition is more likely to develop in:  Women.  People who have a family history of anxiety disorders.  People who are very shy.  People who experience very stressful life events, such as the death of a loved one.  People who have a very stressful family environment. What are the signs or symptoms? People with GAD often worry excessively about many things in their lives, such as their health and family. They may also be overly concerned about:  Doing well at work.  Being on time.  Natural disasters.  Friendships. Physical  symptoms of GAD include:  Fatigue.  Muscle tension or having muscle twitches.  Trembling or feeling shaky.  Being easily startled.  Feeling like your heart is pounding or racing.  Feeling out of breath or like you cannot take a deep breath.  Having trouble falling asleep or staying asleep.  Sweating.  Nausea, diarrhea, or irritable bowel syndrome (IBS).  Headaches.  Trouble concentrating or remembering facts.  Restlessness.  Irritability. How is this diagnosed? Your health care provider can diagnose GAD based on your symptoms and medical history. You will also have a physical exam. The health care provider will ask specific questions about your symptoms, including how severe they are, when they started, and if they come and go. Your health care provider may ask you about your use of alcohol or drugs, including prescription medicines. Your health care provider may refer you to a mental health specialist for further evaluation. Your health care provider will do a thorough examination and may perform additional tests to rule out other possible causes of your symptoms. To be diagnosed with GAD, a person must have anxiety that:  Is out of his or her control.  Affects several different aspects of his or her life, such as work and relationships.  Causes distress that makes him or her unable to take part in normal activities.  Includes at least three physical symptoms of GAD, such as restlessness, fatigue, trouble concentrating, irritability, muscle tension, or sleep problems. Before your health care provider can confirm a diagnosis of GAD, these symptoms must be present more days than they are not,   and they must last for six months or longer. How is this treated? The following therapies are usually used to treat GAD:  Medicine. Antidepressant medicine is usually prescribed for long-term daily control. Antianxiety medicines may be added in severe cases, especially when panic  attacks occur.  Talk therapy (psychotherapy). Certain types of talk therapy can be helpful in treating GAD by providing support, education, and guidance. Options include:  Cognitive behavioral therapy (CBT). People learn coping skills and techniques to ease their anxiety. They learn to identify unrealistic or negative thoughts and behaviors and to replace them with positive ones.  Acceptance and commitment therapy (ACT). This treatment teaches people how to be mindful as a way to cope with unwanted thoughts and feelings.  Biofeedback. This process trains you to manage your body's response (physiological response) through breathing techniques and relaxation methods. You will work with a therapist while machines are used to monitor your physical symptoms.  Stress management techniques. These include yoga, meditation, and exercise. A mental health specialist can help determine which treatment is best for you. Some people see improvement with one type of therapy. However, other people require a combination of therapies. Follow these instructions at home:  Take over-the-counter and prescription medicines only as told by your health care provider.  Try to maintain a normal routine.  Try to anticipate stressful situations and allow extra time to manage them.  Practice any stress management or self-calming techniques as taught by your health care provider.  Do not punish yourself for setbacks or for not making progress.  Try to recognize your accomplishments, even if they are small.  Keep all follow-up visits as told by your health care provider. This is important. Contact a health care provider if:  Your symptoms do not get better.  Your symptoms get worse.  You have signs of depression, such as:  A persistently sad, cranky, or irritable mood.  Loss of enjoyment in activities that used to bring you joy.  Change in weight or eating.  Changes in sleeping habits.  Avoiding friends or  family members.  Loss of energy for normal tasks.  Feelings of guilt or worthlessness. Get help right away if:  You have serious thoughts about hurting yourself or others. If you ever feel like you may hurt yourself or others, or have thoughts about taking your own life, get help right away. You can go to your nearest emergency department or call:  Your local emergency services (911 in the [place]  A suicide crisis helpline, such as the National Suicide Prevention Lifeline at (480)011-3590. This is open 24 hours a day. Summary  Generalized anxiety disorder (GAD) is a mental health disorder that involves worry that is not triggered by a specific event.  People with GAD often worry excessively about many things in their lives, such as their health and family.  GAD may cause physical symptoms such as restlessness, trouble concentrating, sleep problems, frequent sweating, nausea, diarrhea, headaches, and trembling or muscle twitching.  A mental health specialist can help determine which treatment is best for you. Some people see improvement with one type of therapy. However, other people require a combination of therapies. This information is not intended to replace advice given to you by your health care provider. Make sure you discuss any questions you have with your health care provider. Document Released: 09/28/2012 Document Revised: 04/23/2016 Document Reviewed: 04/23/2016 Elsevier Interactive Patient Education  2017 [employer] Restless Legs Syndrome Restless legs syndrome is a condition that causes uncomfortable  feelings or sensations in the legs, especially while sitting or lying down. The sensations usually cause an overwhelming urge to move the legs. The arms can also sometimes be affected. The condition can range from mild to severe. The symptoms often interfere with a person's ability to sleep. What are the causes? The cause of this condition is not known. What increases the  risk? This condition is more likely to develop in:  People who are older than age 58.  Pregnant women. In general, restless legs syndrome is more common in women than in men.  People who have a family history of the condition.  People who have certain medical conditions, such as iron deficiency, kidney disease, Parkinson disease, or nerve damage.  People who take certain medicines, such as medicines for high blood pressure, nausea, colds, allergies, depression, and some heart conditions. What are the signs or symptoms? The main symptom of this condition is uncomfortable sensations in the legs. These sensations may be:  Described as pulling, tingling, prickling, throbbing, crawling, or burning.  Worse while you are sitting or lying down.  Worse during periods of rest or inactivity.  Worse at night, often interfering with your sleep.  Accompanied by a very strong urge to move your legs.  Temporarily relieved by movement of your legs. The sensations usually affect both sides of the body. The arms can also be affected, but this is rare. People who have this condition often have tiredness during the day because of their lack of sleep at night. How is this diagnosed? This condition may be diagnosed based on your description of the symptoms. You may also have tests, including blood tests, to check for other conditions that may lead to your symptoms. In some cases, you may be asked to spend some time in a sleep lab so your sleeping can be monitored. How is this treated? Treatment for this condition is focused on managing the symptoms. Treatment may include:  Self-help and lifestyle changes.  Medicines. Follow these instructions at home:  Take medicines only as directed by your health care provider.  Try these methods to get temporary relief from the uncomfortable sensations:  Massage your legs.  Walk or stretch.  Take a cold or hot bath.  Practice good sleep habits. For  example, go to bed and get up at the same time every day.  Exercise regularly.  Practice ways of relaxing, such as yoga or meditation.  Avoid caffeine and alcohol.  Do not use any tobacco products, including cigarettes, chewing tobacco, or electronic cigarettes. If you need help quitting, ask your health care provider.  Keep all follow-up visits as directed by your health care provider. This is important. Contact a health care provider if: Your symptoms do not improve with treatment, or they get worse. This information is not intended to replace advice given to you by your health care provider. Make sure you discuss any questions you have with your health care provider. Document Released: 05/24/2002 Document Revised: 11/09/2015 Document Reviewed: 05/30/2014 Elsevier Interactive Patient Education  2017 [employer].

## 2016-09-28 NOTE — Progress Notes (Signed)
Chief Complaint  Patient presents with  . Medication Refill    Buproprion    HPI  Anxiety and Depression Patient reports that her Psychiatrist left and she saw another provider in the office  She reports that her last dose was 3-4 days ago Bupropion  XL once a day She reports that it is working for her She denies any side effects She reports that when she does not take it she gets withdrawal effects with emotional lability.  RLS- chronic problem Was getting medications from Psychiatry for RLS. Takes Pramipexole 0.5mg  2 tablet at bedtime. She reports that her RLS is uncontrolled with jumping leg muscles and pain.  She reports that the 1.0mg  total dose of Mirapex is not helping anymore. Lab Results  Component Value Date   HGB 12.3 09/15/2015    Obesity Wt Readings from Last 3 Encounters:  09/28/16 202 lb (91.6 kg)  09/02/16 199 lb 12.8 oz (90.6 kg)  08/28/16 199 lb 6.4 oz (90.4 kg)   She reports that she has not been sleeping well She has felt emotional lately She reports that she is not exercising   Past Medical History:  Diagnosis Date  . Allergy   . Anxiety   . Complication of anesthesia   . Depression   . Elevated white blood cell count   . Elevated white blood cell count 09/25/2011  . GERD (gastroesophageal reflux disease)   . Headache    Migraine  . Hypertension   . Kidney stone   . Neuromuscular disorder (HCC)    carpel tunnel disease  . PONV (postoperative nausea and vomiting)   . Restless leg syndrome   . Shortness of breath dyspnea    with exertion  . Sleep apnea    does not have CPAP machine, mostly affects back sleeping was encouraged to sleep on her sides    Current Outpatient Prescriptions  Medication Sig Dispense Refill  . buPROPion (WELLBUTRIN XL) 300 MG 24 hr tablet Take 1 tablet (300 mg total) by mouth daily. 90 tablet 0  . citalopram (CELEXA) 20 MG tablet Take 10 mg by mouth daily.     Marland Kitchen escitalopram (LEXAPRO) 10 MG tablet TK 1 T PO  QD  5  . folic acid (FOLVITE) 1 MG tablet Take 1 mg by mouth daily.    . hydrOXYzine (ATARAX/VISTARIL) 25 MG tablet Take 25 mg by mouth 3 (three) times daily as needed for anxiety or vomiting.    Marland Kitchen lisinopril-hydrochlorothiazide (PRINZIDE,ZESTORETIC) 10-12.5 MG tablet Take 1 tablet by mouth daily. 90 tablet 3  . naratriptan (AMERGE) 2.5 MG tablet Take 1 tablet (2.5 mg total) by mouth as needed for migraine. Take one (1) tablet at onset of headache; if returns or does not resolve, may repeat after 4 hours; do not exceed five (5) mg in 24 hours. 10 tablet 5  . nystatin (MYCOSTATIN/NYSTOP) powder Apply topically 2 (two) times daily. 15 g 1  . pramipexole (MIRAPEX) 1.5 MG tablet Take 1 tablet (1.5 mg total) by mouth at bedtime. 30 tablet 3  . pravastatin (PRAVACHOL) 20 MG tablet Take 1 tablet (20 mg total) by mouth daily. 90 tablet 3  . valACYclovir (VALTREX) 1000 MG tablet Take 2 tablets every 12 hours. For one day with outbreak. 20 tablet 0  . clotrimazole-betamethasone (LOTRISONE) cream     . ondansetron (ZOFRAN) 4 MG tablet Take 4 mg by mouth every 8 (eight) hours as needed for nausea or vomiting.    . promethazine (PHENERGAN) 25 MG tablet  Take 25 mg by mouth every 6 (six) hours as needed for nausea or vomiting.    . topiramate (TOPAMAX) 50 MG tablet Take 1 tablet (50 mg total) by mouth at bedtime. (Patient not taking: Reported on 09/28/2016) 30 tablet 3   No current facility-administered medications for this visit.     Allergies:  Allergies  Allergen Reactions  . Bee Venom Hives and Swelling  . Enablex [Darifenacin Hydrobromide] Other (See Comments)    Phlebitis.  . Erythromycin Hives  . Macrobid [Nitrofurantoin Monohyd Macro] Nausea And Vomiting  . Nitrofuran Derivatives Nausea And Vomiting    Past Surgical History:  Procedure Laterality Date  . CHOLECYSTECTOMY    . COLONOSCOPY    . DILATION AND CURETTAGE OF UTERUS  1993  . ECTOPIC PREGNANCY SURGERY    . ESSURE TUBAL LIGATION    .  LITHOTRIPSY    . ROBOTIC ASSISTED TOTAL HYSTERECTOMY WITH SALPINGECTOMY Left 09/14/2015   Procedure: ROBOTIC ASSISTED TOTAL HYSTERECTOMY WITH LEFT SALPINGECTOMY;  Surgeon: Noland Fordyce, MD;  Location: [facility];  Service: Gynecology;  Laterality: Left;  . UPPER GI ENDOSCOPY    . WISDOM TOOTH EXTRACTION      Social History   Social History  . Marital status: Divorced    Spouse name: N/A  . Number of children: N/A  . Years of education: N/A   Social History Main Topics  . Smoking status: Never Smoker  . Smokeless tobacco: Never Used  . Alcohol use No  . Drug use: No  . Sexual activity: Yes   Other Topics Concern  . None   Social History Narrative  . None    ROS See hpi  Objective: Vitals:   09/28/16 1403  BP: 121/85  Pulse: (!) 101  Resp: 16  Temp: 98.3 F (36.8 C)  TempSrc: Oral  SpO2: 98%  Weight: 202 lb (91.6 kg)  Height: 4' 11.75" (1.518 m)   Wt Readings from Last 3 Encounters:  09/28/16 202 lb (91.6 kg)  09/02/16 199 lb 12.8 oz (90.6 kg)  08/28/16 199 lb 6.4 oz (90.4 kg)   Body mass index is 39.78 kg/m.  Physical Exam  Constitutional: She is oriented to person, place, and time. She appears well-developed and well-nourished.  HENT:  Head: Normocephalic and atraumatic.  Eyes: Conjunctivae and EOM are normal.  Cardiovascular: Normal rate, regular rhythm and normal heart sounds.   No murmur heard. Pulmonary/Chest: Effort normal and breath sounds normal. No respiratory distress. She has no wheezes.  Neurological: She is alert and oriented to person, place, and time.  Psychiatric: She has a normal mood and affect. Her behavior is normal. Judgment and thought content normal.     Assessment and Plan Campbell was seen today for medication refill.  Diagnoses and all orders for this visit:  Generalized anxiety disorder- check with insurance for   RLS (restless legs syndrome)- increased pramipexole to 1.5mg  tablet since pt was taking a total dose of 1.0mg   without achieving good control Would recommend 3 months follow up and at that time will check ferritin and cbc  Obesity (BMI 30-39.9)- advised exercise for weight loss and to help stabilize mood  Other orders -     buPROPion (WELLBUTRIN XL) 300 MG 24 hr tablet; Take 1 tablet (300 mg total) by mouth daily. -     pramipexole (MIRAPEX) 1.5 MG tablet; Take 1 tablet (1.5 mg total) by mouth at bedtime.     Fabienne Nolasco A Hattie Pine

## 2016-10-09 ENCOUNTER — Other Ambulatory Visit: Payer: Self-pay | Admitting: Family Medicine

## 2016-11-06 ENCOUNTER — Encounter: Payer: Self-pay | Admitting: Family Medicine

## 2016-11-06 ENCOUNTER — Ambulatory Visit (INDEPENDENT_AMBULATORY_CARE_PROVIDER_SITE_OTHER): Payer: 59 | Admitting: Family Medicine

## 2016-11-06 VITALS — BP 98/65 | HR 87 | Temp 98.3°F | Resp 16 | Ht 60.0 in | Wt 200.4 lb

## 2016-11-06 DIAGNOSIS — S76211A Strain of adductor muscle, fascia and tendon of right thigh, initial encounter: Secondary | ICD-10-CM | POA: Diagnosis not present

## 2016-11-06 DIAGNOSIS — M791 Myalgia, unspecified site: Secondary | ICD-10-CM

## 2016-11-06 MED ORDER — CYCLOBENZAPRINE HCL 5 MG PO TABS: 5.0000 mg | ORAL_TABLET | Freq: Three times a day (TID) | ORAL | 0 refills | Status: DC | PRN

## 2016-11-06 MED ORDER — IBUPROFEN 800 MG PO TABS: 800.0000 mg | ORAL_TABLET | Freq: Three times a day (TID) | ORAL | 0 refills | Status: DC | PRN

## 2016-11-06 NOTE — Patient Instructions (Addendum)
IF you received an x-ray today, you will receive an invoice from [facility]. Please contact [facility] at [phone] with questions or concerns regarding your invoice.   IF you received labwork today, you will receive an invoice from [facility]. Please contact [facility] at [phone] with questions or concerns regarding your invoice.   Our billing staff will not be able to assist you with questions regarding bills from these companies.  You will be contacted with the lab results as soon as they are available. The fastest way to get your results is to activate your My Chart account. Instructions are located on the last page of this paperwork. If you have not heard from us regarding the results in 2 weeks, please contact this office.    We recommend that you schedule a mammogram for breast cancer screening. Typically, you do not need a referral to do this. Please contact a local imaging center to schedule your mammogram.  [facility] - [phone]  *ask for the Radiology Department [facility] - [phone] or [phone]  [facility] - [phone] [facility] - [phone] [facility] - [phone]  *ask for the Radiology Department [facility] - [phone]) [phone]  *ask for the Radiology Department [facility] - [phone]  *ask for the Mammography Department [facility] - [phone](336) [phone]  Adductor Muscle Strain An adductor muscle strain, also called a groin strain or pull, is an injury to the muscles or tendon on the upper inner part of the thigh. These muscles are called the adductor muscles or groin muscles. They are responsible for moving the leg across the body or pulling the legs together. A muscle strain occurs when a muscle is overstretched and some muscle fibers are torn. An adductor muscle strain can range from mild to severe, depending on how  many muscle fibers are affected and whether the muscle fibers are partially or completely torn. Adductor muscle strains usually occur during exercise or participation in sports. The injury often happens when a sudden, violent force is placed on a muscle, stretching the muscle too far. A strain is more likely to occur when your muscles are not warmed up or if you are not properly conditioned. Depending on the severity of the muscle strain, recovery time may vary from a few weeks to several weeks. Severe injuries often require 4-6 weeks for recovery. In those cases, complete healing can take 4-5 months. What are the causes? This injury may be caused by:  Stretching the adductor muscles too far or too suddenly, often during side-to-side motion with an abrupt change in direction.  Putting repeated stress on the adductor muscles over a long period of time.  Performing vigorous activity without properly stretching the adductor muscles beforehand. What are the signs or symptoms? Symptoms of this condition include:  Pain and tenderness in the groin area. This begins as sharp pain and persists as a dull ache.  A popping or snapping feeling when the injury occurs (for severe strains).  Swelling or bruising.  Muscle spasms.  Weakness in the leg.  Stiffness in the groin area with decreased ability to move the affected muscles. How is this diagnosed? This condition may be diagnosed based on your symptoms, your description of how the injury occurred, and a physical exam. X-rays are sometimes needed to rule out a broken bone or cartilage problems. A CT scan or MRI may be done if your  health care provider suspects a complete muscle tear or needs to check for other injuries. How is this treated? An adductor strain will often heal on its own. Typically, treatment will involve protecting the injured area, rest, ice, pressure (compression), and elevation. This is often called PRICE therapy. Your health care  provider may also prescribe medicines to help manage pain and swelling (anti-inflammatory medicine). You may be told to use crutches for the first few days to minimize your pain. Follow these instructions at home: PRICE Therapy   Protect the muscle from being injured again.  Rest. Do not use the strained muscle if it causes pain.  If directed, apply ice to the injured area:  Put ice in a plastic bag.  Place a towel between your skin and the bag.  Leave the ice on for 20 minutes, 2-3 times a day. Do this for the first 2 days after the injury.  Apply compression by wrapping the injured area with an elastic bandage as told by your health care provider.  Raise (elevate) the injured area above the level of your heart while you are sitting or lying down. General instructions   Take over-the-counter and prescription medicines only as told by your health care provider.  Walk, stretch, and do exercises as told by your health care provider. Only do these activities if you can do so without any pain. How is this prevented?  Warm up and stretch before being active.  Cool down and stretch after being active.  Give your body time to rest between periods of activity.  Make sure to use equipment that fits you.  Be safe and responsible while being active to avoid slips and falls.  Maintain physical fitness, including:  Proper conditioning in the adductor muscles.  Overall strength, flexibility, and endurance. Contact a health care provider if:  You have increased pain or swelling in the affected area.  Your symptoms are not improving or they are getting worse. This information is not intended to replace advice given to you by your health care provider. Make sure you discuss any questions you have with your health care provider. Document Released: 01/30/2004 Document Revised: 12/22/2015 Document Reviewed: 03/07/2015 Elsevier Interactive Patient Education  2017 [employer].

## 2016-11-06 NOTE — Progress Notes (Signed)
Chief Complaint  Patient presents with  . Groin Pain    UPPER RIGHT THIGH AREA X 1 MONTH    HPI  Pt reports that she has been having intermittent groin for Helen month that was intermittent at first On the right side States that she sits at work and was getting up to stretch due to the groin discomfort She reports that she notices that it was increasing in intensity and was about Helen 5/10 She tried topical biofreeze which is no longer helping She tried tylenol and ibuprofen 800mg  that did not help She states that she feels like the top of the thigh and leg are feeling cold Now when she sits she gets an ache When she weight bear it gets worse   Past Medical History:  Diagnosis Date  . Allergy   . Anxiety   . Complication of anesthesia   . Depression   . Elevated white blood cell count   . Elevated white blood cell count 09/25/2011  . GERD (gastroesophageal reflux disease)   . Headache    Migraine  . Hypertension   . Kidney stone   . Neuromuscular disorder (HCC)    carpel tunnel disease  . PONV (postoperative nausea and vomiting)   . Restless leg syndrome   . Shortness of breath dyspnea    with exertion  . Sleep apnea    does not have CPAP machine, mostly affects back sleeping was encouraged to sleep on her sides    Current Outpatient Prescriptions  Medication Sig Dispense Refill  . buPROPion (WELLBUTRIN XL) 300 MG 24 hr tablet Take 1 tablet (300 mg total) by mouth daily. 90 tablet 0  . citalopram (CELEXA) 20 MG tablet Take 10 mg by mouth daily.     . clotrimazole-betamethasone (LOTRISONE) cream     . escitalopram (LEXAPRO) 10 MG tablet TK 1 T PO QD  5  . folic acid (FOLVITE) 1 MG tablet Take 1 mg by mouth daily.    . hydrOXYzine (ATARAX/VISTARIL) 25 MG tablet Take 25 mg by mouth 3 (three) times daily as needed for anxiety or vomiting.    Marland Kitchen lisinopril-hydrochlorothiazide (PRINZIDE,ZESTORETIC) 10-12.5 MG tablet Take 1 tablet by mouth daily. 90 tablet 3  . naratriptan  (AMERGE) 2.5 MG tablet Take 1 tablet (2.5 mg total) by mouth as needed for migraine. Take one (1) tablet at onset of headache; if returns or does not resolve, may repeat after 4 hours; do not exceed five (5) mg in 24 hours. 10 tablet 5  . nystatin (MYCOSTATIN/NYSTOP) powder Apply topically 2 (two) times daily. 15 g 1  . pramipexole (MIRAPEX) 1.5 MG tablet Take 1 tablet (1.5 mg total) by mouth at bedtime. 30 tablet 3  . valACYclovir (VALTREX) 1000 MG tablet TAKE 2 TABLETS BY MOUTH EVERY 12 HOURS FOR ONE DAY WITH OUTBREAK 20 tablet 0  . cyclobenzaprine (FLEXERIL) 5 MG tablet Take 1 tablet (5 mg total) by mouth 3 (three) times daily as needed for muscle spasms. 30 tablet 0  . ibuprofen (ADVIL,MOTRIN) 800 MG tablet Take 1 tablet (800 mg total) by mouth every 8 (eight) hours as needed. 30 tablet 0  . ondansetron (ZOFRAN) 4 MG tablet Take 4 mg by mouth every 8 (eight) hours as needed for nausea or vomiting.    . pravastatin (PRAVACHOL) 20 MG tablet Take 1 tablet (20 mg total) by mouth daily. (Patient not taking: Reported on 11/06/2016) 90 tablet 3  . promethazine (PHENERGAN) 25 MG tablet Take 25 mg by mouth  every 6 (six) hours as needed for nausea or vomiting.    . topiramate (TOPAMAX) 50 MG tablet Take 1 tablet (50 mg total) by mouth at bedtime. (Patient not taking: Reported on 09/28/2016) 30 tablet 3   No current facility-administered medications for this visit.     Allergies:  Allergies  Allergen Reactions  . Bee Venom Hives and Swelling  . Enablex [Darifenacin Hydrobromide] Other (See Comments)    Phlebitis.  . Erythromycin Hives  . Macrobid [Nitrofurantoin Monohyd Macro] Nausea And Vomiting  . Nitrofuran Derivatives Nausea And Vomiting    Past Surgical History:  Procedure Laterality Date  . CHOLECYSTECTOMY    . COLONOSCOPY    . DILATION AND CURETTAGE OF UTERUS  1993  . ECTOPIC PREGNANCY SURGERY    . ESSURE TUBAL LIGATION    . LITHOTRIPSY    . ROBOTIC ASSISTED TOTAL HYSTERECTOMY WITH  SALPINGECTOMY Left 09/14/2015   Procedure: ROBOTIC ASSISTED TOTAL HYSTERECTOMY WITH LEFT SALPINGECTOMY;  Surgeon: Noland FordyceKelly Fogleman, MD;  Location: [facility];  Service: Gynecology;  Laterality: Left;  . UPPER GI ENDOSCOPY    . WISDOM TOOTH EXTRACTION      Social History   Social History  . Marital status: Divorced    Spouse name: N/Helen  . Number of children: N/Helen  . Years of education: N/Helen   Social History Main Topics  . Smoking status: Never Smoker  . Smokeless tobacco: Never Used  . Alcohol use No  . Drug use: No  . Sexual activity: Yes   Other Topics Concern  . None   Social History Narrative  . None    ROS See hpi  Objective: Vitals:   11/06/16 1601  BP: 98/65  Pulse: 87  Resp: 16  Temp: 98.3 F (36.8 C)  TempSrc: Oral  SpO2: 96%  Weight: 200 lb 6.4 oz (90.9 kg)  Height: 5' (1.524 m)    Physical Exam  Constitutional: She is oriented to person, place, and time. She appears well-developed and well-nourished.  HENT:  Head: Normocephalic and atraumatic.  Eyes: Conjunctivae and EOM are normal.  Cardiovascular: Normal rate, regular rhythm and normal heart sounds.   Pulmonary/Chest: Breath sounds normal. No respiratory distress.  Musculoskeletal:  Right abduction and adduction elicits pain Hip without tenderness Knees with normal range of motion   Neurological: She is alert and oriented to person, place, and time.    Assessment and Plan Helen Greene was seen today for groin pain.  Diagnoses and all orders for this visit:  Groin strain, right, initial encounter Muscle pain -     Ambulatory referral to Physical Therapy -     cyclobenzaprine (FLEXERIL) 5 MG tablet; Take 1 tablet (5 mg total) by mouth 3 (three) times daily as needed for muscle spasms. -     ibuprofen (ADVIL,MOTRIN) 800 MG tablet; Take 1 tablet (800 mg total) by mouth every 8 (eight) hours as needed.     Helen Greene Helen Greene

## 2016-11-21 ENCOUNTER — Ambulatory Visit (INDEPENDENT_AMBULATORY_CARE_PROVIDER_SITE_OTHER): Payer: 59 | Admitting: Family Medicine

## 2016-11-21 ENCOUNTER — Encounter: Payer: Self-pay | Admitting: Family Medicine

## 2016-11-21 ENCOUNTER — Ambulatory Visit (INDEPENDENT_AMBULATORY_CARE_PROVIDER_SITE_OTHER): Payer: 59

## 2016-11-21 VITALS — BP 122/86 | HR 93 | Temp 98.8°F | Resp 16 | Ht 60.0 in | Wt 194.2 lb

## 2016-11-21 DIAGNOSIS — Z1389 Encounter for screening for other disorder: Secondary | ICD-10-CM | POA: Diagnosis not present

## 2016-11-21 DIAGNOSIS — A084 Viral intestinal infection, unspecified: Secondary | ICD-10-CM

## 2016-11-21 DIAGNOSIS — R112 Nausea with vomiting, unspecified: Secondary | ICD-10-CM

## 2016-11-21 LAB — POCT CBC
Granulocyte percent: 74.8 %G (ref 37–80)
HCT, POC: 44.3 % (ref 37.7–47.9)
HEMOGLOBIN: 15.5 g/dL (ref 12.2–16.2)
LYMPH, POC: 2.5 (ref 0.6–3.4)
MCH, POC: 31.4 pg — AB (ref 27–31.2)
MCHC: 35 g/dL (ref 31.8–35.4)
MCV: 89.5 fL (ref 80–97)
MID (cbc): 0.3 (ref 0–0.9)
MPV: 7.4 fL (ref 0–99.8)
POC Granulocyte: 8.2 — AB (ref 2–6.9)
POC LYMPH PERCENT: 22.8 %L (ref 10–50)
POC MID %: 2.4 % (ref 0–12)
Platelet Count, POC: 304 10*3/uL (ref 142–424)
RBC: 4.94 M/uL (ref 4.04–5.48)
RDW, POC: 12.6 %
WBC: 10.9 10*3/uL — AB (ref 4.6–10.2)

## 2016-11-21 LAB — POCT URINALYSIS DIP (MANUAL ENTRY)
Glucose, UA: NEGATIVE mg/dL
LEUKOCYTES UA: NEGATIVE
NITRITE UA: NEGATIVE
PH UA: 6 (ref 5.0–8.0)
Spec Grav, UA: >=1.03 — AB (ref 1.010–1.025)
Urobilinogen, UA: 1 E.U./dL

## 2016-11-21 LAB — POC MICROSCOPIC URINALYSIS (UMFC): Mucus: ABSENT

## 2016-11-21 MED ORDER — ONDANSETRON 4 MG PO TBDP: 4.0000 mg | ORAL_TABLET | Freq: Three times a day (TID) | ORAL | 0 refills | Status: DC | PRN

## 2016-11-21 MED ORDER — ONDANSETRON 4 MG PO TBDP
8.0000 mg | ORAL_TABLET | Freq: Once | ORAL | Status: AC
  Administered 2016-11-21: 8 mg via ORAL

## 2016-11-21 NOTE — Patient Instructions (Addendum)
  I think you did pick up the "stomach bug" from your family.  The medical term for this is viral gastroenteritis.   Take the Zofran every 8 hours for nausea relief.    If you start having worsening vomiting, fevers and chills, or worsening abdominal pain come back to see us or go to the ED.  It was good to see you today.  Feel better!   IF you received an x-ray today, you will receive an invoice from [facility]. Please contact [facility] at [phone] with questions or concerns regarding your invoice.   IF you received labwork today, you will receive an invoice from [facility]. Please contact [facility] at [phone] with questions or concerns regarding your invoice.   Our billing staff will not be able to assist you with questions regarding bills from these companies.  You will be contacted with the lab results as soon as they are available. The fastest way to get your results is to activate your My Chart account. Instructions are located on the last page of this paperwork. If you have not heard from us regarding the results in 2 weeks, please contact this office.

## 2016-11-21 NOTE — Progress Notes (Signed)
Helen Greene is a 46 y.o. female who presents to Primary Care at [facility] today for N/V:   1.  N/V/abdominal cramping:  No actual abdominal pain, just cramping when she vomits.  Has had decreased fluid intake due to this.  Started about 2 days ago.  Granddaughter and daughter have had similar symptoms, which lasted for 2-3 days and then resolved.  Some chills at night.  Vomiting was initially all food, then became just bile.  Last vomiting episode was yesterday.  Has had diarrhea as well, 1 episode yesterday, then formed stool today.  She has been able to tolerate liquids throughout entire time.  Hasn't had much food past day or so due to symptoms and not wanting to vomit.    Noticed hematuria that started 2 days ago.  States "this happens all the time when I get dehydrated."  No dysuria, no back pain, no increased frequency/urgency, no hesitancy.   ROS as above.    PMH reviewed. Patient is a nonsmoker.   Past Medical History:  Diagnosis Date  . Allergy   . Anxiety   . Complication of anesthesia   . Depression   . Elevated white blood cell count   . Elevated white blood cell count 09/25/2011  . GERD (gastroesophageal reflux disease)   . Headache    Migraine  . Hypertension   . Kidney stone   . Neuromuscular disorder (HCC)    carpel tunnel disease  . PONV (postoperative nausea and vomiting)   . Restless leg syndrome   . Shortness of breath dyspnea    with exertion  . Sleep apnea    does not have CPAP machine, mostly affects back sleeping was encouraged to sleep on her sides   Past Surgical History:  Procedure Laterality Date  . CHOLECYSTECTOMY    . COLONOSCOPY    . DILATION AND CURETTAGE OF UTERUS  1993  . ECTOPIC PREGNANCY SURGERY    . ESSURE TUBAL LIGATION    . LITHOTRIPSY    . ROBOTIC ASSISTED TOTAL HYSTERECTOMY WITH SALPINGECTOMY Left 09/14/2015   Procedure: ROBOTIC ASSISTED TOTAL HYSTERECTOMY WITH LEFT SALPINGECTOMY;  Surgeon: Noland Fordyce, MD;  Location: [facility];   Service: Gynecology;  Laterality: Left;  . UPPER GI ENDOSCOPY    . WISDOM TOOTH EXTRACTION      Medications reviewed.    Physical Exam:  BP 122/86   Pulse 93   Temp 98.8 F (37.1 C) (Oral)   Resp 16   Ht 5' (1.524 m)   Wt 194 lb 3.2 oz (88.1 kg)   LMP 09/07/2015 (Exact Date)   SpO2 99%   BMI 37.93 kg/m  Gen:  Alert, cooperative patient who appears stated age in no acute distress.  Vital signs reviewed. HEENT: EOMI,  MMM Pulm:  Clear to auscultation bilaterally  Cardiac:  Regular rate and rhythm without murmur auscultated.  Good S1/S2. Abd:  Soft/nondistended/nontender.  Normal BS throughout.   Exts: Non edematous BL  LE, warm and well perfused.   Assessment and Plan:  1.  Nausea and vomiting: - most likely viral gastroenteritis with family members with the same symptoms.  Abdominal exam benign on exam today.  - she feels a little better today.  24 hours since last vomiting episode - plan to treat as such with zofran to ensure continued hydration.   - moist mucus membranes and not tachycardic, though high end of normal pulse on exam - FU if worsening, unable to take PO, or not better in  next few days.   2.  Hematuria:  - intermittent per patient report.  Has had normal, non-hematuric U/A from March of this year.   - likely secondary to dehydration as patient reports -- however she should return once her symptoms have resolved to ensure hematuria has not persisted.   - she has no urinary symptoms, so not likely UTI

## 2016-11-22 LAB — COMPREHENSIVE METABOLIC PANEL
A/G RATIO: 1.3 (ref 1.2–2.2)
ALBUMIN: 4 g/dL (ref 3.5–5.5)
ALK PHOS: 152 IU/L — AB (ref 39–117)
ALT: 76 IU/L — AB (ref 0–32)
AST: 48 IU/L — ABNORMAL HIGH (ref 0–40)
BUN / CREAT RATIO: 15 (ref 9–23)
BUN: 13 mg/dL (ref 6–24)
Bilirubin Total: 0.3 mg/dL (ref 0.0–1.2)
CHLORIDE: 99 mmol/L (ref 96–106)
CO2: 19 mmol/L (ref 18–29)
Calcium: 9.1 mg/dL (ref 8.7–10.2)
Creatinine, Ser: 0.88 mg/dL (ref 0.57–1.00)
GFR calc non Af Amer: 79 mL/min/{1.73_m2} (ref >59)
GFR, EST AFRICAN AMERICAN: 91 mL/min/{1.73_m2} (ref >59)
Globulin, Total: 3.2 g/dL (ref 1.5–4.5)
Glucose: 120 mg/dL — ABNORMAL HIGH (ref 65–99)
POTASSIUM: 3.5 mmol/L (ref 3.5–5.2)
Sodium: 138 mmol/L (ref 134–144)
TOTAL PROTEIN: 7.2 g/dL (ref 6.0–8.5)

## 2016-11-22 LAB — LIPASE: Lipase: 40 U/L (ref 14–72)

## 2016-12-04 ENCOUNTER — Encounter: Payer: Self-pay | Admitting: Family Medicine

## 2016-12-04 ENCOUNTER — Ambulatory Visit (INDEPENDENT_AMBULATORY_CARE_PROVIDER_SITE_OTHER): Payer: 59 | Admitting: Family Medicine

## 2016-12-04 VITALS — BP 118/80 | HR 98 | Temp 98.3°F | Resp 16 | Ht 59.25 in | Wt 195.0 lb

## 2016-12-04 DIAGNOSIS — A084 Viral intestinal infection, unspecified: Secondary | ICD-10-CM | POA: Diagnosis not present

## 2016-12-04 DIAGNOSIS — N3946 Mixed incontinence: Secondary | ICD-10-CM

## 2016-12-04 DIAGNOSIS — S76211D Strain of adductor muscle, fascia and tendon of right thigh, subsequent encounter: Secondary | ICD-10-CM

## 2016-12-04 LAB — POCT URINALYSIS DIP (MANUAL ENTRY)
BILIRUBIN UA: NEGATIVE
BILIRUBIN UA: NEGATIVE mg/dL
Blood, UA: NEGATIVE
Glucose, UA: NEGATIVE mg/dL
LEUKOCYTES UA: NEGATIVE
NITRITE UA: NEGATIVE
PH UA: 6.5 (ref 5.0–8.0)
PROTEIN UA: NEGATIVE mg/dL
Spec Grav, UA: 1.02 (ref 1.010–1.025)
Urobilinogen, UA: 0.2 E.U./dL

## 2016-12-04 NOTE — Progress Notes (Signed)
Chief Complaint  Patient presents with  . groin strain    follow up from 11/06/16 and would like to know if ok to draw labs dr Gwendolyn Grant suggested on 11/21/16 visitt    HPI  Patient here to follow up regarding  Pt reports that she will be establishing with [facility] She will discuss her urinary incontinence with a specialist about a urethral sling   She states that she has not follow up with PT as yet for groin strain.  She states that her groin pain is improved She was out of work for a week due to viral gastroenteritis and it seemed like the time off work helped her groin pain substantially         Past Medical History:  Diagnosis Date  . Allergy   . Anxiety   . Complication of anesthesia   . Depression   . Elevated white blood cell count   . Elevated white blood cell count 09/25/2011  . GERD (gastroesophageal reflux disease)   . Headache    Migraine  . Hypertension   . Kidney stone   . Neuromuscular disorder (HCC)    carpel tunnel disease  . PONV (postoperative nausea and vomiting)   . Restless leg syndrome   . Shortness of breath dyspnea    with exertion  . Sleep apnea    does not have CPAP machine, mostly affects back sleeping was encouraged to sleep on her sides    Current Outpatient Prescriptions  Medication Sig Dispense Refill  . buPROPion (WELLBUTRIN XL) 300 MG 24 hr tablet Take 1 tablet (300 mg total) by mouth daily. 90 tablet 0  . clotrimazole-betamethasone (LOTRISONE) cream     . cyclobenzaprine (FLEXERIL) 5 MG tablet Take 1 tablet (5 mg total) by mouth 3 (three) times daily as needed for muscle spasms. 30 tablet 0  . escitalopram (LEXAPRO) 10 MG tablet TK 1 T PO QD  5  . folic acid (FOLVITE) 1 MG tablet Take 1 mg by mouth daily.    . hydrOXYzine (ATARAX/VISTARIL) 25 MG tablet Take 25 mg by mouth 3 (three) times daily as needed for anxiety or vomiting.    Marland Kitchen ibuprofen (ADVIL,MOTRIN) 800 MG tablet Take 1 tablet (800 mg total) by mouth every 8 (eight)  hours as needed. 30 tablet 0  . lisinopril-hydrochlorothiazide (PRINZIDE,ZESTORETIC) 10-12.5 MG tablet Take 1 tablet by mouth daily. 90 tablet 3  . nystatin (MYCOSTATIN/NYSTOP) powder Apply topically 2 (two) times daily. 15 g 1  . ondansetron (ZOFRAN ODT) 4 MG disintegrating tablet Take 1 tablet (4 mg total) by mouth every 8 (eight) hours as needed for nausea or vomiting. 20 tablet 0  . ondansetron (ZOFRAN) 4 MG tablet Take 4 mg by mouth every 8 (eight) hours as needed for nausea or vomiting.    . pramipexole (MIRAPEX) 1.5 MG tablet Take 1 tablet (1.5 mg total) by mouth at bedtime. 30 tablet 3  . valACYclovir (VALTREX) 1000 MG tablet TAKE 2 TABLETS BY MOUTH EVERY 12 HOURS FOR ONE DAY WITH OUTBREAK 20 tablet 0  . promethazine (PHENERGAN) 25 MG tablet Take 25 mg by mouth every 6 (six) hours as needed for nausea or vomiting.     No current facility-administered medications for this visit.     Allergies:  Allergies  Allergen Reactions  . Bee Venom Hives and Swelling  . Enablex [Darifenacin Hydrobromide] Other (See Comments)    Phlebitis.  . Erythromycin Hives  . Macrobid [Nitrofurantoin Monohyd Macro] Nausea And Vomiting  .  Nitrofuran Derivatives Nausea And Vomiting    Past Surgical History:  Procedure Laterality Date  . CHOLECYSTECTOMY    . COLONOSCOPY    . DILATION AND CURETTAGE OF UTERUS  1993  . ECTOPIC PREGNANCY SURGERY    . ESSURE TUBAL LIGATION    . LITHOTRIPSY    . ROBOTIC ASSISTED TOTAL HYSTERECTOMY WITH SALPINGECTOMY Left 09/14/2015   Procedure: ROBOTIC ASSISTED TOTAL HYSTERECTOMY WITH LEFT SALPINGECTOMY;  Surgeon: Noland FordyceKelly Fogleman, MD;  Location: [facility];  Service: Gynecology;  Laterality: Left;  . UPPER GI ENDOSCOPY    . WISDOM TOOTH EXTRACTION      Social History   Social History  . Marital status: Divorced    Spouse name: N/A  . Number of children: N/A  . Years of education: N/A   Social History Main Topics  . Smoking status: Never Smoker  . Smokeless tobacco:  Never Used  . Alcohol use No  . Drug use: No  . Sexual activity: Yes   Other Topics Concern  . None   Social History Narrative  . None    Review of Systems  Constitutional: Negative for chills and fever.  Gastrointestinal: Negative for abdominal pain, nausea and vomiting.  Neurological: Negative for dizziness, tingling and headaches.    Objective: Vitals:   12/04/16 1635  BP: 118/80  Pulse: 98  Resp: 16  Temp: 98.3 F (36.8 C)  TempSrc: Oral  SpO2: 98%  Weight: 195 lb (88.5 kg)  Height: 4' 11.25" (1.505 m)    Physical Exam  Constitutional: She is oriented to person, place, and time. She appears well-developed and well-nourished.  HENT:  Head: Normocephalic and atraumatic.  Cardiovascular: Normal rate, regular rhythm and normal heart sounds.   Pulmonary/Chest: Effort normal and breath sounds normal. No respiratory distress. She has no wheezes.  Abdominal: Soft. Bowel sounds are normal. She exhibits no distension. There is no tenderness.  No suprapubic tenderness, no flank pain  Neurological: She is alert and oriented to person, place, and time.  Psychiatric: She has a normal mood and affect. Her behavior is normal. Judgment and thought content normal.    Assessment and Plan Helen RowerLiesa was seen today for groin strain.  Diagnoses and all orders for this visit:  Viral gastroenteritis- resolved  Groin strain, right, subsequent encounter- improved  Mixed stress and urge urinary incontinence- follow up with urology for incontinence Urine improved -     POCT urinalysis dipstick     Helen Greene A Creta LevinStallings

## 2016-12-04 NOTE — Patient Instructions (Addendum)
IF you received an x-ray today, you will receive an invoice from [facility]. Please contact [facility] at [phone] with questions or concerns regarding your invoice.   IF you received labwork today, you will receive an invoice from [place]. Please contact [facility] at [phone] with questions or concerns regarding your invoice.   Our billing staff will not be able to assist you with questions regarding bills from these companies.  You will be contacted with the lab results as soon as they are available. The fastest way to get your results is to activate your My Chart account. Instructions are located on the last page of this paperwork. If you have not heard from [place] regarding the results in 2 weeks, please contact this office.    We recommend that you schedule a mammogram for breast cancer screening. Typically, you do not need a referral to do this. Please contact a local imaging center to schedule your mammogram.  [facility] - [phone]  *ask for the Radiology Department [facility]) - [phone] or [phone]  [facility] - [phone] [facility] - [phone] [facility] - [phone]  *ask for the Radiology Department [facility] - [phone]  *ask for the Radiology Department [facility] - [phone]  *ask for the Mammography Department [facility] - [phone]) [phone] Vaginal Sling A urethral vaginal sling procedure is surgery to correct urinary incontinence. Urinary incontinence is uncontrolled loss of urine. It is common in women who have had children and in older women. In this surgery, a strong piece of material is placed under the tube that drains the bladder (urethra). This sling is made of tension-free vaginal tape or nylon mesh. It fits under the urethra like a hammock. The sling is put in position to straighten,  support, and hold the urethra in its normal position. Tell a health care provider about:  Any allergies you have.  All medicines you are taking, including vitamins, herbs, eye drops, creams, and over-the-counter medicines.  Any problems you or family members have had with anesthetic medicines.  Any blood disorders you have.  Any surgeries you have had.  Any medical conditions you have. What are the risks? Generally, this is a safe procedure. However, as with any procedure, complications can occur. Possible complications include:  Infection.  Excessive bleeding.  Damage to other organs.  Problems urinating properly for several days or weeks.  Problems from the use of anesthetics.  Return of the urinary incontinence.  What happens before the procedure?  Ask your health care provider about changing or stopping your regular medicines. You may need to stop taking certain medicines 1 week before the surgery.  Do not eat or drink anything for 6-8 hours before the surgery.  If you smoke, do not smoke for at least 2 weeks before the surgery.  Make plans to have someone drive you home after your hospital stay. Also arrange for someone to help you with activities during recovery. What happens during the procedure?  You will have general or spinal anesthesia. With general anesthesia, you are asleep and will feel no pain. With spinal anesthesia, you are numb from the waist down, but you will still be awake.  A catheter is placed in your bladder to drain urine during the procedure.  An incision is made in your vagina and low on your belly in the hairline.  The sling material is passed around  your bladder neck and sutured to the muscles to hold the urethra in its normal position.  The incisions are closed. What happens after the procedure?  You will be taken to a recovery area where your progress will be monitored closely. Your breathing, blood pressure, and pulse (vital signs)  will be checked often. When you are stable, you will be moved to a regular hospital room.  You will have a catheter in place to drain your bladder. This will stay in place until your bladder is working properly on its own.  You may have a gauze packing in the vagina to prevent bleeding. This will be removed in 1-2 days.  You will likely need to stay in the hospital for 2-3 days. This information is not intended to replace advice given to you by your health care provider. Make sure you discuss any questions you have with your health care provider. Document Released: 03/12/2008 Document Revised: 11/09/2015 Document Reviewed: 11/20/2012 Elsevier Interactive Patient Education  [employer]2018 [employer]

## 2016-12-07 ENCOUNTER — Encounter: Payer: Self-pay | Admitting: Family Medicine

## 2016-12-07 ENCOUNTER — Emergency Department (HOSPITAL_COMMUNITY): Payer: 59

## 2016-12-07 ENCOUNTER — Emergency Department (HOSPITAL_COMMUNITY)
Admission: EM | Admit: 2016-12-07 | Discharge: 2016-12-07 | Disposition: A | Discharge status: Home / Self Care | Payer: 59 | Attending: Emergency Medicine | Admitting: Emergency Medicine

## 2016-12-07 ENCOUNTER — Encounter (HOSPITAL_COMMUNITY): Payer: Self-pay

## 2016-12-07 ENCOUNTER — Ambulatory Visit (INDEPENDENT_AMBULATORY_CARE_PROVIDER_SITE_OTHER): Payer: 59 | Admitting: Family Medicine

## 2016-12-07 VITALS — BP 127/88 | HR 100 | Temp 98.1°F | Resp 16 | Ht 59.25 in | Wt 192.0 lb

## 2016-12-07 DIAGNOSIS — R1032 Left lower quadrant pain: Secondary | ICD-10-CM

## 2016-12-07 DIAGNOSIS — K5909 Other constipation: Secondary | ICD-10-CM | POA: Diagnosis not present

## 2016-12-07 DIAGNOSIS — N83202 Unspecified ovarian cyst, left side: Secondary | ICD-10-CM | POA: Insufficient documentation

## 2016-12-07 DIAGNOSIS — Z79899 Other long term (current) drug therapy: Secondary | ICD-10-CM | POA: Diagnosis not present

## 2016-12-07 DIAGNOSIS — I10 Essential (primary) hypertension: Secondary | ICD-10-CM | POA: Diagnosis not present

## 2016-12-07 LAB — COMPREHENSIVE METABOLIC PANEL
ALK PHOS: 135 U/L — AB (ref 38–126)
ALT: 94 U/L — ABNORMAL HIGH (ref 14–54)
ANION GAP: 10 (ref 5–15)
AST: 171 U/L — ABNORMAL HIGH (ref 15–41)
Albumin: 4.3 g/dL (ref 3.5–5.0)
BILIRUBIN TOTAL: 1.2 mg/dL (ref 0.3–1.2)
BUN: 13 mg/dL (ref 6–20)
CALCIUM: 9.4 mg/dL (ref 8.9–10.3)
CO2: 23 mmol/L (ref 22–32)
Chloride: 103 mmol/L (ref 101–111)
Creatinine, Ser: 0.84 mg/dL (ref 0.44–1.00)
GFR calc non Af Amer: >60 mL/min (ref >60)
Glucose, Bld: 102 mg/dL — ABNORMAL HIGH (ref 65–99)
Potassium: 3.7 mmol/L (ref 3.5–5.1)
Sodium: 136 mmol/L (ref 135–145)
Total Protein: 7.9 g/dL (ref 6.5–8.1)

## 2016-12-07 LAB — URINALYSIS, ROUTINE W REFLEX MICROSCOPIC
BILIRUBIN URINE: NEGATIVE
Bacteria, UA: NONE SEEN
Glucose, UA: NEGATIVE mg/dL
Ketones, ur: 5 mg/dL — AB
LEUKOCYTES UA: NEGATIVE
NITRITE: NEGATIVE
PH: 5 (ref 5.0–8.0)
Protein, ur: 30 mg/dL — AB
SPECIFIC GRAVITY, URINE: 1.027 (ref 1.005–1.030)

## 2016-12-07 LAB — POCT URINALYSIS DIP (MANUAL ENTRY)
GLUCOSE UA: NEGATIVE mg/dL
Leukocytes, UA: NEGATIVE
NITRITE UA: NEGATIVE
PH UA: 6 (ref 5.0–8.0)
Spec Grav, UA: >=1.03 — AB (ref 1.010–1.025)
UROBILINOGEN UA: 0.2 U/dL

## 2016-12-07 LAB — POCT CBC
Granulocyte percent: 77.5 %G (ref 37–80)
HEMATOCRIT: 44.7 % (ref 37.7–47.9)
Hemoglobin: 15.4 g/dL (ref 12.2–16.2)
LYMPH, POC: 2.8 (ref 0.6–3.4)
MCH, POC: 31 pg (ref 27–31.2)
MCHC: 34.4 g/dL (ref 31.8–35.4)
MCV: 90.1 fL (ref 80–97)
MID (CBC): 0.9 (ref 0–0.9)
MPV: 7.7 fL (ref 0–99.8)
POC GRANULOCYTE: 12.6 — AB (ref 2–6.9)
POC LYMPH %: 17.2 % (ref 10–50)
POC MID %: 5.3 % (ref 0–12)
Platelet Count, POC: 397 10*3/uL (ref 142–424)
RBC: 4.96 M/uL (ref 4.04–5.48)
RDW, POC: 13.3 %
WBC: 16.3 10*3/uL — AB (ref 4.6–10.2)

## 2016-12-07 LAB — LIPASE, BLOOD: Lipase: 36 U/L (ref 11–51)

## 2016-12-07 MED ORDER — IOPAMIDOL (ISOVUE-300) INJECTION 61%
INTRAVENOUS | Status: AC
  Administered 2016-12-07: 100 mL via INTRAVENOUS
  Filled 2016-12-07: qty 100

## 2016-12-07 MED ORDER — ONDANSETRON HCL 4 MG/2ML IJ SOLN
INTRAMUSCULAR | Status: AC
  Filled 2016-12-07: qty 2

## 2016-12-07 MED ORDER — MAGNESIUM CITRATE PO SOLN: 0.5000 | Freq: Once | ORAL | 0 refills | Status: AC

## 2016-12-07 MED ORDER — MORPHINE SULFATE (PF) 4 MG/ML IV SOLN
2.0000 mg | Freq: Once | INTRAVENOUS | Status: AC
  Administered 2016-12-07: 2 mg via INTRAVENOUS
  Filled 2016-12-07: qty 1

## 2016-12-07 MED ORDER — KETOROLAC TROMETHAMINE 15 MG/ML IJ SOLN
15.0000 mg | Freq: Once | INTRAMUSCULAR | Status: AC
  Administered 2016-12-07: 15 mg via INTRAVENOUS
  Filled 2016-12-07: qty 1

## 2016-12-07 MED ORDER — ONDANSETRON HCL 4 MG/2ML IJ SOLN
4.0000 mg | Freq: Once | INTRAMUSCULAR | Status: AC
  Administered 2016-12-07: 4 mg via INTRAVENOUS

## 2016-12-07 NOTE — ED Provider Notes (Signed)
[facility]-EMERGENCY DEPT Provider Note   CSN: [id number] Arrival date & time: 12/07/16  1552     History   Chief Complaint Chief Complaint  Patient presents with  . Abdominal Pain    HPI Helen Greene is a 46 y.o. female with history of anxiety, migraine headaches, HTN, nephrolithiasis who presents a with chief complaint acute onset, progressively worsening left lower quadrant abdominal pain since yesterday. Patient states pain began acutely yesterday evening and is constant and severe. She states at times it radiates to the left flank. She endorses chronic constipation, and states that she took a laxative yesterday, with a hard bowel movement today. She was seen by her primary care earlier today and found to have a leukocytosis and was told to come to the ED for evaluation of diverticulitis. Pain is aggravated with laughter and palpation, alleviated by laying still. She denies fevers, chills, chest pain, shortness of breath, nausea, vomiting. She denies dysuria or frequency, but states that her urine has been dark because she has not been drinking. Has a history of nephrolithiasis, but states this feels different. She states she has a proximally 1-2 alcoholic beverages per week. She is status post hysterectomy and denies any abnormal vaginal bleeding, discharge, itching.  The history is provided by the patient.    Past Medical History:  Diagnosis Date  . Allergy   . Anxiety   . Complication of anesthesia   . Depression   . Elevated white blood cell count   . Elevated white blood cell count 09/25/2011  . GERD (gastroesophageal reflux disease)   . Headache    Migraine  . Hypertension   . Kidney stone   . Neuromuscular disorder (HCC)    carpel tunnel disease  . PONV (postoperative nausea and vomiting)   . Restless leg syndrome   . Shortness of breath dyspnea    with exertion  . Sleep apnea    does not have CPAP machine, mostly affects back sleeping was encouraged to sleep on her  sides    Patient Active Problem List   Diagnosis Date Noted  . S/P total hysterectomy 09/14/2015  . Frequent headaches 03/17/2015  . RLS (restless legs syndrome) 03/17/2015  . HTN (hypertension) 05/19/2013  . Elevated white blood cell count 09/25/2011    Past Surgical History:  Procedure Laterality Date  . CHOLECYSTECTOMY    . COLONOSCOPY    . DILATION AND CURETTAGE OF UTERUS  1993  . ECTOPIC PREGNANCY SURGERY    . ESSURE TUBAL LIGATION    . LITHOTRIPSY    . ROBOTIC ASSISTED TOTAL HYSTERECTOMY WITH SALPINGECTOMY Left 09/14/2015   Procedure: ROBOTIC ASSISTED TOTAL HYSTERECTOMY WITH LEFT SALPINGECTOMY;  Surgeon: Noland Fordyce, MD;  Location: [facility];  Service: Gynecology;  Laterality: Left;  . UPPER GI ENDOSCOPY    . WISDOM TOOTH EXTRACTION      OB History    No data available       Home Medications    Prior to Admission medications   Medication Sig Start Date End Date Taking? Authorizing Provider  buPROPion (WELLBUTRIN XL) 300 MG 24 hr tablet Take 1 tablet (300 mg total) by mouth daily. 09/28/16  Yes Doristine Bosworth, MD  cyclobenzaprine (FLEXERIL) 5 MG tablet Take 1 tablet (5 mg total) by mouth 3 (three) times daily as needed for muscle spasms. 11/06/16  Yes Stallings, Zoe A, MD  escitalopram (LEXAPRO) 10 MG tablet TK 1 T PO QD 01/02/15  Yes [provider]  folic acid (FOLVITE)  1 MG tablet Take 1 mg by mouth daily.   Yes [provider]  lisinopril-hydrochlorothiazide (PRINZIDE,ZESTORETIC) 10-12.5 MG tablet Take 1 tablet by mouth daily. 06/28/16  Yes Stallings, Zoe A, MD  nystatin (MYCOSTATIN/NYSTOP) powder Apply topically 2 (two) times daily. 06/28/16  Yes Stallings, Zoe A, MD  ondansetron (ZOFRAN ODT) 4 MG disintegrating tablet Take 1 tablet (4 mg total) by mouth every 8 (eight) hours as needed for nausea or vomiting. 11/21/16  Yes Tobey GrimWalden, Jeffrey H, MD  pramipexole (MIRAPEX) 1.5 MG tablet Take 1 tablet (1.5 mg total) by mouth at bedtime. 09/28/16  Yes  Stallings, Zoe A, MD  valACYclovir (VALTREX) 1000 MG tablet TAKE 2 TABLETS BY MOUTH EVERY 12 HOURS FOR ONE DAY WITH OUTBREAK 10/09/16  Yes Stallings, Zoe A, MD  clotrimazole-betamethasone (LOTRISONE) cream Apply 1 application topically daily as needed (rash).  06/28/16   [provider]  hydrOXYzine (ATARAX/VISTARIL) 25 MG tablet Take 25 mg by mouth 3 (three) times daily as needed for anxiety or vomiting.    [provider]  ibuprofen (ADVIL,MOTRIN) 800 MG tablet Take 1 tablet (800 mg total) by mouth every 8 (eight) hours as needed. 11/06/16   Doristine BosworthStallings, Zoe A, MD  magnesium citrate SOLN Take 148 mLs (0.5 Bottles total) by mouth once. 12/07/16 12/07/16  Jeanie SewerFawze, Jaron Czarnecki A, PA-C    Family History Family History  Problem Relation Age of Onset  . Hypertension Mother   . Heart failure Mother   . Cancer Mother   . Heart disease Mother   . Heart disease Brother   . Hypertension Brother   . Cancer Father        Leukemia  . Hypertension Father   . Cancer Sister     Social History Social History  Substance Use Topics  . Smoking status: Never Smoker  . Smokeless tobacco: Never Used  . Alcohol use No     Allergies   Bee venom; Enablex [darifenacin hydrobromide]; Erythromycin; Macrobid [nitrofurantoin monohyd macro]; and Nitrofuran derivatives   Review of Systems Review of Systems  Constitutional: Negative for chills and fever.  Respiratory: Negative for shortness of breath.   Cardiovascular: Negative for chest pain.  Gastrointestinal: Positive for abdominal pain and constipation. Negative for diarrhea, nausea and vomiting.  Genitourinary: Positive for flank pain. Negative for dysuria, hematuria, vaginal bleeding and vaginal discharge.  All other systems reviewed and are negative.    Physical Exam Updated Vital Signs BP (!) 105/54 (BP Location: Left Arm)   Pulse 88   Temp 98.1 F (36.7 C) (Oral)   Resp 15   LMP 09/07/2015 (Exact Date)   SpO2 98%   Physical Exam    Constitutional: She appears well-developed and well-nourished. No distress.  HENT:  Head: Normocephalic and atraumatic.  Eyes: Conjunctivae are normal. Right eye exhibits no discharge. Left eye exhibits no discharge.  Neck: No JVD present. No tracheal deviation present.  Cardiovascular: Normal rate, regular rhythm and normal heart sounds.   Pulmonary/Chest: Effort normal and breath sounds normal.  Abdominal: Soft. She exhibits no distension. There is tenderness. There is guarding. There is no rebound.  Hypoactive bowel sounds, tender to palpation in the left lower quadrant. Murphy's absent, Rovsing's absent, no tenderness to palpation at McBurney's point. No CVA tenderness.  Musculoskeletal: She exhibits no edema.  No midline spine TTP, mild right-sided paraspinal muscle tenderness. No SI joint tenderness  Neurological: She is alert.  Skin: Skin is warm and dry. Capillary refill takes less than 2 seconds. She is not  diaphoretic.  Psychiatric: She has a normal mood and affect. Her behavior is normal.  Nursing note and vitals reviewed.    ED Treatments / Results  Labs (all labs ordered are listed, but only abnormal results are displayed) Labs Reviewed  COMPREHENSIVE METABOLIC PANEL - Abnormal; Notable for the following:       Result Value   Glucose, Bld 102 (*)    AST 171 (*)    ALT 94 (*)    Alkaline Phosphatase 135 (*)    All other components within normal limits  URINALYSIS, ROUTINE W REFLEX MICROSCOPIC - Abnormal; Notable for the following:    Color, Urine AMBER (*)    APPearance HAZY (*)    Hgb urine dipstick SMALL (*)    Ketones, ur 5 (*)    Protein, ur 30 (*)    Squamous Epithelial / LPF 0-5 (*)    All other components within normal limits  LIPASE, BLOOD    EKG  EKG Interpretation None       Radiology US Transvaginal Non-ob  Result Date: 12/07/2016 CLINICAL DATA:  46 year old female with left lower quadrant pain. History of hysterectomy. EXAM: TRANSABDOMINAL  AND TRANSVAGINAL ULTRASOUND OF PELVIS DOPPLER ULTRASOUND OF OVARIES TECHNIQUE: Both transabdominal and transvaginal ultrasound examinations of the pelvis were performed. Transabdominal technique was performed for global imaging of the pelvis including uterus, ovaries, adnexal regions, and pelvic cul-de-sac. It was necessary to proceed with endovaginal exam following the transabdominal exam to visualize the ovaries. Color and duplex Doppler ultrasound was utilized to evaluate blood flow to the ovaries. COMPARISON:  Abdominal CT dated 12/07/2016 FINDINGS: Uterus Hysterectomy. Endometrium Hysterectomy. Right ovary Not seen. Left ovary Measurements: 5.0 x 3.8 x 3.9 cm. There is a 4.6 x 2.8 x 2.9 cm cyst in the left adnexa corresponding to the cyst seen on the CT. Doppler images demonstrate presence of arterial and venous flow to the periphery of this cyst, likely to the ovarian parenchyma. No definite intracystic solid component or mural solid nodule noted. No flow within the cyst. This likely represents a physiologic cyst. Follow-up with ultrasound in 8-12 weeks recommended to document resolution. If the cyst persist on follow-up imaging further characterization with MRI is recommended. Other findings No abnormal free fluid. IMPRESSION: 1. Left ovarian cyst. Follow-up with ultrasound in 8-12 weeks recommended. 2. Doppler demonstrated arterial and venous flow to the left ovary. 3. Hysterectomy. 4. Nonvisualization of right ovary. Electronically Signed   By: Elgie Collard M.D.   On: 12/07/2016 22:19   US Pelvis Complete  Result Date: 12/07/2016 CLINICAL DATA:  46 year old female with left lower quadrant pain. History of hysterectomy. EXAM: TRANSABDOMINAL AND TRANSVAGINAL ULTRASOUND OF PELVIS DOPPLER ULTRASOUND OF OVARIES TECHNIQUE: Both transabdominal and transvaginal ultrasound examinations of the pelvis were performed. Transabdominal technique was performed for global imaging of the pelvis including uterus,  ovaries, adnexal regions, and pelvic cul-de-sac. It was necessary to proceed with endovaginal exam following the transabdominal exam to visualize the ovaries. Color and duplex Doppler ultrasound was utilized to evaluate blood flow to the ovaries. COMPARISON:  Abdominal CT dated 12/07/2016 FINDINGS: Uterus Hysterectomy. Endometrium Hysterectomy. Right ovary Not seen. Left ovary Measurements: 5.0 x 3.8 x 3.9 cm. There is a 4.6 x 2.8 x 2.9 cm cyst in the left adnexa corresponding to the cyst seen on the CT. Doppler images demonstrate presence of arterial and venous flow to the periphery of this cyst, likely to the ovarian parenchyma. No definite intracystic solid component or mural solid nodule noted. No  flow within the cyst. This likely represents a physiologic cyst. Follow-up with ultrasound in 8-12 weeks recommended to document resolution. If the cyst persist on follow-up imaging further characterization with MRI is recommended. Other findings No abnormal free fluid. IMPRESSION: 1. Left ovarian cyst. Follow-up with ultrasound in 8-12 weeks recommended. 2. Doppler demonstrated arterial and venous flow to the left ovary. 3. Hysterectomy. 4. Nonvisualization of right ovary. Electronically Signed   By: Elgie Collard M.D.   On: 12/07/2016 22:19   Ct Abdomen Pelvis W Contrast  Result Date: 12/07/2016 CLINICAL DATA:  46 year old female with left lower quadrant abdominal pain. EXAM: CT ABDOMEN AND PELVIS WITH CONTRAST TECHNIQUE: Multidetector CT imaging of the abdomen and pelvis was performed using the standard protocol following bolus administration of intravenous contrast. CONTRAST:  <See Chart> ISOVUE-300 IOPAMIDOL (ISOVUE-300) INJECTION 61% COMPARISON:  Abdominal CT dated 04/13/2014 FINDINGS: Lower chest: The visualized lung bases are clear.  Abdominal No intra-abdominal free air.  Small free fluid within pelvis. Hepatobiliary: There is apparent mild fatty infiltration of the liver. No intrahepatic biliary ductal  dilatation. Cholecystectomy. No retained calcified stone noted in the central CBD. Pancreas: Unremarkable. No pancreatic ductal dilatation or surrounding inflammatory changes. Spleen: Normal in size without focal abnormality. Adrenals/Urinary Tract: There is a bilobed stone or 2 adjacent stones in the inferior pole of the left kidney with combined in maximal diameter of 9 mm. There is no hydronephrosis on the left. A punctate nonobstructing right renal interpolar stone is seen. There is mild fullness of the right renal collecting system without frank hydronephrosis. There is uniform and symmetric enhancement of the kidneys as well as symmetric excretion of the contrast. The visualized ureters and urinary bladder appear unremarkable. Stomach/Bowel: There is no evidence of bowel obstruction or active inflammation. Normal appendix. Vascular/Lymphatic: No significant vascular findings are present. No enlarged abdominal or pelvic lymph nodes. Reproductive: Prior hysterectomy. The right ovary appears unremarkable. There is a multi-septated cyst arising from the left ovary in the left anterior hemipelvis measuring approximately 4.5 x 4.7 cm. There is mild haziness of the wall of the cyst. This may represent a complex cyst with inflammatory changes. Ovarian abscess or a cystic neoplasm is not entirely excluded. Correlation with clinical exam and further evaluation with pelvic ultrasound recommended. Other: None Musculoskeletal: No acute or significant osseous findings. IMPRESSION: 1. Left ovarian complex cystic lesion with apparent mild inflammation. An ovarian abscess or cystic neoplasm is not entirely excluded. Correlation with clinical exam and further evaluation with pelvic ultrasound recommended. 2. Hysterectomy.  Unremarkable right ovary. 3. Mild fatty liver. 4. No bowel obstruction or active inflammation.  Normal appendix. 5. Nonobstructing left renal inferior pole calculi. Electronically Signed   By: Elgie Collard M.D.   On: 12/07/2016 19:58   [place] Art/ven Flow Abd Pelv Doppler  Result Date: 12/07/2016 CLINICAL DATA:  46 year old female with left lower quadrant pain. History of hysterectomy. EXAM: TRANSABDOMINAL AND TRANSVAGINAL ULTRASOUND OF PELVIS DOPPLER ULTRASOUND OF OVARIES TECHNIQUE: Both transabdominal and transvaginal ultrasound examinations of the pelvis were performed. Transabdominal technique was performed for global imaging of the pelvis including uterus, ovaries, adnexal regions, and pelvic cul-de-sac. It was necessary to proceed with endovaginal exam following the transabdominal exam to visualize the ovaries. Color and duplex Doppler ultrasound was utilized to evaluate blood flow to the ovaries. COMPARISON:  Abdominal CT dated 12/07/2016 FINDINGS: Uterus Hysterectomy. Endometrium Hysterectomy. Right ovary Not seen. Left ovary Measurements: 5.0 x 3.8 x 3.9 cm. There is a 4.6 x 2.8 x 2.9  cm cyst in the left adnexa corresponding to the cyst seen on the CT. Doppler images demonstrate presence of arterial and venous flow to the periphery of this cyst, likely to the ovarian parenchyma. No definite intracystic solid component or mural solid nodule noted. No flow within the cyst. This likely represents a physiologic cyst. Follow-up with ultrasound in 8-12 weeks recommended to document resolution. If the cyst persist on follow-up imaging further characterization with MRI is recommended. Other findings No abnormal free fluid. IMPRESSION: 1. Left ovarian cyst. Follow-up with ultrasound in 8-12 weeks recommended. 2. Doppler demonstrated arterial and venous flow to the left ovary. 3. Hysterectomy. 4. Nonvisualization of right ovary. Electronically Signed   By: Elgie Collard M.D.   On: 12/07/2016 22:19    Procedures Procedures (including critical care time)  Medications Ordered in ED Medications  morphine 4 MG/ML injection 2 mg (2 mg Intravenous Given 12/07/16 1955)  iopamidol (ISOVUE-300) 61 %  injection (100 mLs Intravenous Contrast Given 12/07/16 1929)  ondansetron (ZOFRAN) injection 4 mg (4 mg Intravenous Given 12/07/16 2003)  morphine 4 MG/ML injection 2 mg (2 mg Intravenous Given 12/07/16 2118)  ketorolac (TORADOL) 15 MG/ML injection 15 mg (15 mg Intravenous Given 12/07/16 2226)     Initial Impression / Assessment and Plan / ED Course  I have reviewed the triage vital signs and the nursing notes.  Pertinent labs & imaging results that were available during my care of the patient were reviewed by me and considered in my medical decision making (see chart for details).     Patient presents today from primary care with concern for diverticulitis due to acute onset of left lower quadrant pain. Afebrile, initially mildly hypertensive while in the ED, with resolution after administration of morphine. Suspect elevated blood pressure due to pain. CBC done in the primary care office shows leukocytosis of 16.3. UA with small hemoglobin and evidence of dehydration. Low suspicion of nephrolithiasis given CT findings of nonobstructive stones. Low suspicion of UTI and the absence of urinary symptoms, and low suspicion of pyelonephritis in the absence of CVA tenderness. Slight worsening of LFTs from 3 weeks ago; CT abdomen shows mild fatty liver disease. Remainder labwork unremarkable. CT abdomen and pelvis shows in addition to the aforementioned a left ovarian complex cystic lesion with mild inflammation. Obtained ultrasound of the pelvis4.6 x 2.8 x 2.9 cm cyst in the left adnexa with preserved blood flow. Low suspicion of TOA or ovarian torsion. Pain controlled in the ED with morphine and Toradol. Recommend follow-up with primary care for reevaluation and repeat ultrasound in 8-12 weeks. Discussed pain control with ibuprofen and Tylenol. She still complaining of feeling of fullness in her abdomen due to constipation, will send home with magnesium citrate and discussed increased fiber intake, water, and  activity. No evidence of obstruction on imaging. Discussed indications for return to the ED. Pt verbalized understanding of and agreement with plan and is safe for discharge home at this time.   Final Clinical Impressions(s) / ED Diagnoses   Final diagnoses:  Cyst of left ovary  Left lower quadrant pain    New Prescriptions Discharge Medication List as of 12/07/2016 11:13 PM    START taking these medications   Details  magnesium citrate SOLN Take 148 mLs (0.5 Bottles total) by mouth once., Starting Sat 12/07/2016, Print         [place], [place] A, PA-C 12/07/16 2344    Lorre Nick, MD 12/07/16 2348

## 2016-12-07 NOTE — Discharge Instructions (Signed)
Alternate 600 mg of ibuprofen and 500 mg of Tylenol every 3 hours for the next few days for pain. You may apply heat to the area for comfort. You may drink half a bottle of magnesium citrate for constipation. Follow-up with your primary care or OB/GYN for reevaluation in 1 week. Obtain a repeat pelvic ultrasound in 8-12 weeks for reassessment of the ovarian cyst. Return to the ED if any concerning signs or symptoms develop including fevers, chills, burning when you PE, blood in your urine or stool, or severe abdominal or back pain.

## 2016-12-07 NOTE — ED Triage Notes (Signed)
Pt with to [facility] today for abdominal pain in [facility].  Pt had labs drawn.  Pt told to come here for CT scan, fluids and antibiotics.  Thought that she might have "diverticulitis" per the provider.  Pain started yesterday.

## 2016-12-07 NOTE — ED Notes (Signed)
PT HAD A GOLD LAVENDER LT GREEN & DARK GREEN DRAWN IN TRIAGE

## 2016-12-07 NOTE — ED Notes (Signed)
Patient transported to CT 

## 2016-12-07 NOTE — Patient Instructions (Addendum)
Follow up in the ER for CT abdomen  Your exam and your labs are concerning for diverticulitis   IF you received an x-ray today, you will receive an invoice from [facility]. Please contact [facility] at [phone] with questions or concerns regarding your invoice.   IF you received labwork today, you will receive an invoice from [facility]. Please contact [facility] at [phone] with questions or concerns regarding your invoice.   Our billing staff will not be able to assist you with questions regarding bills from these companies.  You will be contacted with the lab results as soon as they are available. The fastest way to get your results is to activate your My Chart account. Instructions are located on the last page of this paperwork. If you have not heard from us regarding the results in 2 weeks, please contact this office.     Diverticulitis Diverticulitis is inflammation or infection of small pouches in your colon that form when you have a condition called diverticulosis. The pouches in your colon are called diverticula. Your colon, or large intestine, is where water is absorbed and stool is formed. Complications of diverticulitis can include:  Bleeding.  Severe infection.  Severe pain.  Perforation of your colon.  Obstruction of your colon.  What are the causes? Diverticulitis is caused by bacteria. Diverticulitis happens when stool becomes trapped in diverticula. This allows bacteria to grow in the diverticula, which can lead to inflammation and infection. What increases the risk? People with diverticulosis are at risk for diverticulitis. Eating a diet that does not include enough fiber from fruits and vegetables may make diverticulitis more likely to develop. What are the signs or symptoms? Symptoms of diverticulitis may include:  Abdominal pain and tenderness. The pain is normally located on the left side of the abdomen, but may occur in other  areas.  Fever and chills.  Bloating.  Cramping.  Nausea.  Vomiting.  Constipation.  Diarrhea.  Blood in your stool.  How is this diagnosed? Your health care provider will ask you about your medical history and do a physical exam. You may need to have tests done because many medical conditions can cause the same symptoms as diverticulitis. Tests may include:  Blood tests.  Urine tests.  Imaging tests of the abdomen, including X-rays and CT scans.  When your condition is under control, your health care provider may recommend that you have a colonoscopy. A colonoscopy can show how severe your diverticula are and whether something else is causing your symptoms. How is this treated? Most cases of diverticulitis are mild and can be treated at home. Treatment may include:  Taking over-the-counter pain medicines.  Following a clear liquid diet.  Taking antibiotic medicines by mouth for 7-10 days.  More severe cases may be treated at a hospital. Treatment may include:  Not eating or drinking.  Taking prescription pain medicine.  Receiving antibiotic medicines through an IV tube.  Receiving fluids and nutrition through an IV tube.  Surgery.  Follow these instructions at home:  Follow your health care provider's instructions carefully.  Follow a full liquid diet or other diet as directed by your health care provider. After your symptoms improve, your health care provider may tell you to change your diet. He or she may recommend you eat a high-fiber diet. Fruits and vegetables are good sources of fiber. Fiber makes it easier to pass stool.  Take fiber supplements or probiotics as directed by your health care provider.  Only take  medicines as directed by your health care provider.  Keep all your follow-up appointments. Contact a health care provider if:  Your pain does not improve.  You have a hard time eating food.  Your bowel movements do not return to  normal. Get help right away if:  Your pain becomes worse.  Your symptoms do not get better.  Your symptoms suddenly get worse.  You have a fever.  You have repeated vomiting.  You have bloody or black, tarry stools. This information is not intended to replace advice given to you by your health care provider. Make sure you discuss any questions you have with your health care provider. Document Released: 03/13/2005 Document Revised: 11/09/2015 Document Reviewed: 04/28/2013 Elsevier Interactive Patient Education  2017 [employer]  Diverticulitis Diverticulitis is inflammation or infection of small pouches in your colon that form when you have a condition called diverticulosis. The pouches in your colon are called diverticula. Your colon, or large intestine, is where water is absorbed and stool is formed. Complications of diverticulitis can include:  Bleeding.  Severe infection.  Severe pain.  Perforation of your colon.  Obstruction of your colon.  What are the causes? Diverticulitis is caused by bacteria. Diverticulitis happens when stool becomes trapped in diverticula. This allows bacteria to grow in the diverticula, which can lead to inflammation and infection. What increases the risk? People with diverticulosis are at risk for diverticulitis. Eating a diet that does not include enough fiber from fruits and vegetables may make diverticulitis more likely to develop. What are the signs or symptoms? Symptoms of diverticulitis may include:  Abdominal pain and tenderness. The pain is normally located on the left side of the abdomen, but may occur in other areas.  Fever and chills.  Bloating.  Cramping.  Nausea.  Vomiting.  Constipation.  Diarrhea.  Blood in your stool.  How is this diagnosed? Your health care provider will ask you about your medical history and do a physical exam. You may need to have tests done because many medical conditions can cause the  same symptoms as diverticulitis. Tests may include:  Blood tests.  Urine tests.  Imaging tests of the abdomen, including X-rays and CT scans.  When your condition is under control, your health care provider may recommend that you have a colonoscopy. A colonoscopy can show how severe your diverticula are and whether something else is causing your symptoms. How is this treated? Most cases of diverticulitis are mild and can be treated at home. Treatment may include:  Taking over-the-counter pain medicines.  Following a clear liquid diet.  Taking antibiotic medicines by mouth for 7-10 days.  More severe cases may be treated at a hospital. Treatment may include:  Not eating or drinking.  Taking prescription pain medicine.  Receiving antibiotic medicines through an IV tube.  Receiving fluids and nutrition through an IV tube.  Surgery.  Follow these instructions at home:  Follow your health care provider's instructions carefully.  Follow a full liquid diet or other diet as directed by your health care provider. After your symptoms improve, your health care provider may tell you to change your diet. He or she may recommend you eat a high-fiber diet. Fruits and vegetables are good sources of fiber. Fiber makes it easier to pass stool.  Take fiber supplements or probiotics as directed by your health care provider.  Only take medicines as directed by your health care provider.  Keep all your follow-up appointments. Contact a health care provider if:  Your pain does not improve.  You have a hard time eating food.  Your bowel movements do not return to normal. Get help right away if:  Your pain becomes worse.  Your symptoms do not get better.  Your symptoms suddenly get worse.  You have a fever.  You have repeated vomiting.  You have bloody or black, tarry stools. This information is not intended to replace advice given to you by your health care provider. Make sure  you discuss any questions you have with your health care provider. Document Released: 03/13/2005 Document Revised: 11/09/2015 Document Reviewed: 04/28/2013 Elsevier Interactive Patient Education  2017 [employer].

## 2016-12-07 NOTE — Progress Notes (Signed)
Chief Complaint  Patient presents with  . Abdominal Pain    Lower left quadrant    HPI   She reports that she had left side lower quadrant pain starting overnight and she thought this was from constipation She had a bowel movement Pt reports that she took a laxative that did not help She reports that she is eating and drinking She reports that her pain was very intense this morning No blood per rectum, no fevers or chills, no nausea or vomiting, no lightheadedness She took her child driving for practice for drivers education before coming in    Past Medical History:  Diagnosis Date  . Allergy   . Anxiety   . Complication of anesthesia   . Depression   . Elevated white blood cell count   . Elevated white blood cell count 09/25/2011  . GERD (gastroesophageal reflux disease)   . Headache    Migraine  . Hypertension   . Kidney stone   . Neuromuscular disorder (HCC)    carpel tunnel disease  . PONV (postoperative nausea and vomiting)   . Restless leg syndrome   . Shortness of breath dyspnea    with exertion  . Sleep apnea    does not have CPAP machine, mostly affects back sleeping was encouraged to sleep on her sides    Current Outpatient Prescriptions  Medication Sig Dispense Refill  . buPROPion (WELLBUTRIN XL) 300 MG 24 hr tablet Take 1 tablet (300 mg total) by mouth daily. 90 tablet 0  . clotrimazole-betamethasone (LOTRISONE) cream Apply 1 application topically daily as needed (rash).     . cyclobenzaprine (FLEXERIL) 5 MG tablet Take 1 tablet (5 mg total) by mouth 3 (three) times daily as needed for muscle spasms. 30 tablet 0  . escitalopram (LEXAPRO) 10 MG tablet TK 1 T PO QD  5  . folic acid (FOLVITE) 1 MG tablet Take 1 mg by mouth daily.    . hydrOXYzine (ATARAX/VISTARIL) 25 MG tablet Take 25 mg by mouth 3 (three) times daily as needed for anxiety or vomiting.    Marland Kitchen ibuprofen (ADVIL,MOTRIN) 800 MG tablet Take 1 tablet (800 mg total) by mouth every 8 (eight) hours as  needed. 30 tablet 0  . lisinopril-hydrochlorothiazide (PRINZIDE,ZESTORETIC) 10-12.5 MG tablet Take 1 tablet by mouth daily. 90 tablet 3  . nystatin (MYCOSTATIN/NYSTOP) powder Apply topically 2 (two) times daily. 15 g 1  . ondansetron (ZOFRAN ODT) 4 MG disintegrating tablet Take 1 tablet (4 mg total) by mouth every 8 (eight) hours as needed for nausea or vomiting. 20 tablet 0  . pramipexole (MIRAPEX) 1.5 MG tablet Take 1 tablet (1.5 mg total) by mouth at bedtime. 30 tablet 3  . valACYclovir (VALTREX) 1000 MG tablet TAKE 2 TABLETS BY MOUTH EVERY 12 HOURS FOR ONE DAY WITH OUTBREAK 20 tablet 0   No current facility-administered medications for this visit.     Allergies:  Allergies  Allergen Reactions  . Bee Venom Hives and Swelling  . Enablex [Darifenacin Hydrobromide] Other (See Comments)    Phlebitis.  . Erythromycin Hives  . Macrobid [Nitrofurantoin Monohyd Macro] Nausea And Vomiting  . Nitrofuran Derivatives Nausea And Vomiting    Past Surgical History:  Procedure Laterality Date  . CHOLECYSTECTOMY    . COLONOSCOPY    . DILATION AND CURETTAGE OF UTERUS  1993  . ECTOPIC PREGNANCY SURGERY    . ESSURE TUBAL LIGATION    . LITHOTRIPSY    . ROBOTIC ASSISTED TOTAL HYSTERECTOMY WITH SALPINGECTOMY Left  09/14/2015   Procedure: ROBOTIC ASSISTED TOTAL HYSTERECTOMY WITH LEFT SALPINGECTOMY;  Surgeon: Noland Fordyce, MD;  Location: [facility];  Service: Gynecology;  Laterality: Left;  . UPPER GI ENDOSCOPY    . WISDOM TOOTH EXTRACTION      Social History   Social History  . Marital status: Divorced    Spouse name: N/A  . Number of children: N/A  . Years of education: N/A   Social History Main Topics  . Smoking status: Never Smoker  . Smokeless tobacco: Never Used  . Alcohol use No  . Drug use: No  . Sexual activity: Yes   Other Topics Concern  . None   Social History Narrative  . None    ROS  Objective: Vitals:   12/07/16 1410  BP: 127/88  Pulse: 100  Resp: 16  Temp: 98.1  F (36.7 C)  TempSrc: Oral  SpO2: 100%  Weight: 192 lb (87.1 kg)  Height: 4' 11.25" (1.505 m)    Physical Exam  Constitutional: She is oriented to person, place, and time. She appears well-developed and well-nourished.  HENT:  Head: Normocephalic and atraumatic.  Cardiovascular: Normal rate, regular rhythm and normal heart sounds.   No murmur heard. Pulmonary/Chest: Effort normal and breath sounds normal. No respiratory distress. She has no wheezes.  Abdominal: Soft. Bowel sounds are normal.  Neurological: She is alert and oriented to person, place, and time.  Skin: Skin is warm. Capillary refill takes less than 2 seconds.  Psychiatric: She has a normal mood and affect. Her behavior is normal. Judgment and thought content normal.     No ecchymosis at the umbilicus Murphy's sign negative mcburney's point nontender Tenderness to palpation in the LLQ, no rebound, no guarding  Lab Results  Component Value Date   WBC 16.3 (A) 12/07/2016   HGB 15.4 12/07/2016   HCT 44.7 12/07/2016   MCV 90.1 12/07/2016   PLT 298 09/15/2015   Component     Latest Ref Rng & Units 11/21/2016 11/21/2016        11:11 AM 11:11 AM  Color, UA     yellow yellow   Clarity, UA     clear cloudy (A)   Glucose     negative mg/dL negative   Bilirubin, UA     negative small (A) trace (5) (A)  Specific Gravity, UA     1.010 - 1.025 >=1.030 (A)   RBC, UA     negative large (A)   pH, UA     5.0 - 8.0 6.0   Protein, UA     negative mg/dL =161 (A)   Urobilinogen, UA     0.2 or 1.0 E.U./dL 1.0   Nitrite, UA     Negative Negative   Leukocytes, UA     Negative Negative     Assessment and Plan Helen Greene was seen today for abdominal pain.  Diagnoses and all orders for this visit:  Left lower quadrant pain- concerning for diverticulitis given history of chronic constipation now with leukocystosis as well as dehydration on UA Will refer to ER for work up At this point cannot rule out ovarian pathology    Pt is hemodynamically stable and can drive to the ER -     POCT urinalysis dipstick -     POCT CBC  Chronic constipation- discussed that she should get GI referral after this acute episode    A total of 30 minutes were spent face-to-face with the patient during this encounter and over half  of that time was spent on counseling and coordination of care.   Zoe A Stallings

## 2016-12-10 ENCOUNTER — Ambulatory Visit (INDEPENDENT_AMBULATORY_CARE_PROVIDER_SITE_OTHER): Payer: 59 | Admitting: Family Medicine

## 2016-12-10 ENCOUNTER — Encounter: Payer: Self-pay | Admitting: Family Medicine

## 2016-12-10 VITALS — BP 130/87 | HR 100 | Temp 98.7°F | Resp 17 | Ht 60.5 in | Wt 194.0 lb

## 2016-12-10 DIAGNOSIS — R7401 Elevation of levels of liver transaminase levels: Secondary | ICD-10-CM

## 2016-12-10 DIAGNOSIS — D72828 Other elevated white blood cell count: Secondary | ICD-10-CM

## 2016-12-10 DIAGNOSIS — R740 Nonspecific elevation of levels of transaminase and lactic acid dehydrogenase [LDH]: Secondary | ICD-10-CM | POA: Diagnosis not present

## 2016-12-10 DIAGNOSIS — K5909 Other constipation: Secondary | ICD-10-CM

## 2016-12-10 DIAGNOSIS — R1032 Left lower quadrant pain: Secondary | ICD-10-CM

## 2016-12-10 DIAGNOSIS — N83299 Other ovarian cyst, unspecified side: Secondary | ICD-10-CM

## 2016-12-10 LAB — POCT CBC
Granulocyte percent: 69.6 %G (ref 37–80)
HCT, POC: 42.1 % (ref 37.7–47.9)
Hemoglobin: 14.5 g/dL (ref 12.2–16.2)
Lymph, poc: 3.6 — AB (ref 0.6–3.4)
MCH: 30.8 pg (ref 27–31.2)
MCHC: 34.5 g/dL (ref 31.8–35.4)
MCV: 89.5 fL (ref 80–97)
MID (CBC): 0.8 (ref 0–0.9)
MPV: 7.5 fL (ref 0–99.8)
PLATELET COUNT, POC: 399 10*3/uL (ref 142–424)
POC Granulocyte: 10 — AB (ref 2–6.9)
POC LYMPH %: 25 % (ref 10–50)
POC MID %: 5.4 % (ref 0–12)
RBC: 4 M/uL — AB (ref 4.04–5.48)
RDW, POC: 13.8 %
WBC: 14.3 10*3/uL — AB (ref 4.6–10.2)

## 2016-12-10 NOTE — Progress Notes (Signed)
Chief Complaint  Patient presents with  . Follow-up    HPI   Abdominal Pain Pt is following up from the ER She was sent in for LLQ pain and leukocytosis.  She reports that she is having less pain especially if she has been having less pain with movement. She still has nausea despite taking zofran. She reports that she would rate her pain is 2/10. She reports that her pain is improved with ibuprofen.  She is s/p hysterectomy and has both ovaries No hot flashes  Constipation She was given magnesium citrate to help her constipation She reports that she struggles with chronic constipation She has about one bm once a week or week and a half Reports that she has not had a colonoscopy Her series were quite severe   Past Medical History:  Diagnosis Date  . Allergy   . Anxiety   . Complication of anesthesia   . Depression   . Elevated white blood cell count   . Elevated white blood cell count 09/25/2011  . GERD (gastroesophageal reflux disease)   . Headache    Migraine  . Hypertension   . Kidney stone   . Neuromuscular disorder (HCC)    carpel tunnel disease  . PONV (postoperative nausea and vomiting)   . Restless leg syndrome   . Shortness of breath dyspnea    with exertion  . Sleep apnea    does not have CPAP machine, mostly affects back sleeping was encouraged to sleep on her sides    Current Outpatient Prescriptions  Medication Sig Dispense Refill  . buPROPion (WELLBUTRIN XL) 300 MG 24 hr tablet Take 1 tablet (300 mg total) by mouth daily. 90 tablet 0  . clotrimazole-betamethasone (LOTRISONE) cream Apply 1 application topically daily as needed (rash).     . cyclobenzaprine (FLEXERIL) 5 MG tablet Take 1 tablet (5 mg total) by mouth 3 (three) times daily as needed for muscle spasms. 30 tablet 0  . escitalopram (LEXAPRO) 10 MG tablet TK 1 T PO QD  5  . folic acid (FOLVITE) 1 MG tablet Take 1 mg by mouth daily.    . hydrOXYzine (ATARAX/VISTARIL) 25 MG tablet Take 25 mg  by mouth 3 (three) times daily as needed for anxiety or vomiting.    Marland Kitchen ibuprofen (ADVIL,MOTRIN) 800 MG tablet Take 1 tablet (800 mg total) by mouth every 8 (eight) hours as needed. 30 tablet 0  . lisinopril-hydrochlorothiazide (PRINZIDE,ZESTORETIC) 10-12.5 MG tablet Take 1 tablet by mouth daily. 90 tablet 3  . nystatin (MYCOSTATIN/NYSTOP) powder Apply topically 2 (two) times daily. 15 g 1  . ondansetron (ZOFRAN ODT) 4 MG disintegrating tablet Take 1 tablet (4 mg total) by mouth every 8 (eight) hours as needed for nausea or vomiting. 20 tablet 0  . pramipexole (MIRAPEX) 1.5 MG tablet Take 1 tablet (1.5 mg total) by mouth at bedtime. 30 tablet 3  . valACYclovir (VALTREX) 1000 MG tablet TAKE 2 TABLETS BY MOUTH EVERY 12 HOURS FOR ONE DAY WITH OUTBREAK 20 tablet 0   No current facility-administered medications for this visit.     Allergies:  Allergies  Allergen Reactions  . Bee Venom Hives and Swelling  . Enablex [Darifenacin Hydrobromide] Other (See Comments)    Phlebitis.  . Erythromycin Hives  . Macrobid [Nitrofurantoin Monohyd Macro] Nausea And Vomiting  . Nitrofuran Derivatives Nausea And Vomiting    Past Surgical History:  Procedure Laterality Date  . CHOLECYSTECTOMY    . COLONOSCOPY    . DILATION AND CURETTAGE  OF UTERUS  1993  . ECTOPIC PREGNANCY SURGERY    . ESSURE TUBAL LIGATION    . LITHOTRIPSY    . ROBOTIC ASSISTED TOTAL HYSTERECTOMY WITH SALPINGECTOMY Left 09/14/2015   Procedure: ROBOTIC ASSISTED TOTAL HYSTERECTOMY WITH LEFT SALPINGECTOMY;  Surgeon: Noland FordyceKelly Fogleman, MD;  Location: [facility];  Service: Gynecology;  Laterality: Left;  . UPPER GI ENDOSCOPY    . WISDOM TOOTH EXTRACTION      Social History   Social History  . Marital status: Divorced    Spouse name: N/A  . Number of children: N/A  . Years of education: N/A   Social History Main Topics  . Smoking status: Never Smoker  . Smokeless tobacco: Never Used  . Alcohol use No  . Drug use: No  . Sexual activity:  Yes   Other Topics Concern  . None   Social History Narrative  . None    ROS Review of Systems See HPI Constitution: No fevers or chills No malaise No diaphoresis Skin: No rash or itching Eyes: no blurry vision, no double vision GU: no dysuria or hematuria Neuro: no dizziness or headaches  Objective: Vitals:   12/10/16 1155  BP: 130/87  Pulse: 100  Resp: 17  Temp: 98.7 F (37.1 C)  TempSrc: Oral  SpO2: 98%  Weight: 194 lb (88 kg)  Height: 5' 0.5" (1.537 m)    Physical Exam  Constitutional: She is oriented to person, place, and time. She appears well-developed and well-nourished.  HENT:  Head: Normocephalic and atraumatic.  Cardiovascular: Normal rate, regular rhythm and normal heart sounds.   Pulmonary/Chest: Effort normal and breath sounds normal. No respiratory distress. She has no wheezes.  Abdominal: Soft. Bowel sounds are normal. She exhibits no distension. There is tenderness in the left lower quadrant. There is no rigidity, no rebound, no guarding, no CVA tenderness, no tenderness at McBurney's point and negative Murphy's sign.  Neurological: She is alert and oriented to person, place, and time.     Assessment and Plan Helen Greene was seen today for follow-up.  Diagnoses and all orders for this visit:  Left lower quadrant pain Ovarian cyst, complex- discussed follow up with Gyne -     Ambulatory referral to Gynecology  Chronic constipation- would advise pt to get evaluation by GI -     Ambulatory referral to Gastroenterology  Other elevated white blood cell (WBC) count- will recheck -     POCT CBC  Elevated transaminase level- will trend -     Comprehensive metabolic panel     Nakhi Choi A Creta LevinStallings

## 2016-12-10 NOTE — Patient Instructions (Addendum)
IF you received an x-ray today, you will receive an invoice from [facility]. Please contact [facility] at [phone] with questions or concerns regarding your invoice.   IF you received labwork today, you will receive an invoice from [facility]. Please contact [facility] at [phone] with questions or concerns regarding your invoice.   Our billing staff will not be able to assist you with questions regarding bills from these companies.  You will be contacted with the lab results as soon as they are available. The fastest way to get your results is to activate your My Chart account. Instructions are located on the last page of this paperwork. If you have not heard from us regarding the results in 2 weeks, please contact this office.      Ovarian Cyst An ovarian cyst is a fluid-filled sac that forms on an ovary. The ovaries are small organs that produce eggs in women. Various types of cysts can form on the ovaries. Some may cause symptoms and require treatment. Most ovarian cysts go away on their own, are not cancerous (are benign), and do not cause problems. Common types of ovarian cysts include:  Functional (follicle) cysts. ? Occur during the menstrual cycle, and usually go away with the next menstrual cycle if you do not get pregnant. ? Usually cause no symptoms.  Endometriomas. ? Are cysts that form from the tissue that lines the uterus (endometrium). ? Are sometimes called "chocolate cysts" because they become filled with blood that turns brown. ? Can cause pain in the lower abdomen during intercourse and during your period.  Cystadenoma cysts. ? Develop from cells on the outside surface of the ovary. ? Can get very large and cause lower abdomen pain and pain with intercourse. ? Can cause severe pain if they twist or break open (rupture).  Dermoid cysts. ? Are sometimes found in both ovaries. ? May contain different kinds of body tissue, such as skin,  teeth, hair, or cartilage. ? Usually do not cause symptoms unless they get very big.  Theca lutein cysts. ? Occur when too much of a certain hormone (human chorionic gonadotropin) is produced and overstimulates the ovaries to produce an egg. ? Are most common after having procedures used to assist with the conception of a baby (in vitro fertilization).  What are the causes? Ovarian cysts may be caused by:  Ovarian hyperstimulation syndrome. This is a condition that can develop from taking fertility medicines. It causes multiple large ovarian cysts to form.  Polycystic ovarian syndrome (PCOS). This is a common hormonal disorder that can cause ovarian cysts, as well as problems with your period or fertility.  What increases the risk? The following factors may make you more likely to develop ovarian cysts:  Being overweight or obese.  Taking fertility medicines.  Taking certain forms of hormonal birth control.  Smoking.  What are the signs or symptoms? Many ovarian cysts do not cause symptoms. If symptoms are present, they may include:  Pelvic pain or pressure.  Pain in the lower abdomen.  Pain during sex.  Abdominal swelling.  Abnormal menstrual periods.  Increasing pain with menstrual periods.  How is this diagnosed? These cysts are commonly found during a routine pelvic exam. You may have tests to find out more about the cyst, such as:  Ultrasound.  X-ray of the pelvis.  CT scan.  MRI.  Blood tests.  How is this treated? Many ovarian cysts go away on their own without treatment. Your health  care provider may want to check your cyst regularly for 2-3 months to see if it changes. If you are in menopause, it is especially important to have your cyst monitored closely because menopausal women have a higher rate of ovarian cancer. When treatment is needed, it may include:  Medicines to help relieve pain.  A procedure to drain the cyst (aspiration).  Surgery to  remove the whole cyst.  Hormone treatment or birth control pills. These methods are sometimes used to help dissolve a cyst.  Follow these instructions at home:  Take over-the-counter and prescription medicines only as told by your health care provider.  Do not drive or use heavy machinery while taking prescription pain medicine.  Get regular pelvic exams and Pap tests as often as told by your health care provider.  Return to your normal activities as told by your health care provider. Ask your health care provider what activities are safe for you.  Do not use any products that contain nicotine or tobacco, such as cigarettes and e-cigarettes. If you need help quitting, ask your health care provider.  Keep all follow-up visits as told by your health care provider. This is important. Contact a health care provider if:  Your periods are late, irregular, or painful, or they stop.  You have pelvic pain that does not go away.  You have pressure on your bladder or trouble emptying your bladder completely.  You have pain during sex.  You have any of the following in your abdomen: ? A feeling of fullness. ? Pressure. ? Discomfort. ? Pain that does not go away. ? Swelling.  You feel generally ill.  You become constipated.  You lose your appetite.  You develop severe acne.  You start to have more body hair and facial hair.  You are gaining weight or losing weight without changing your exercise and eating habits.  You think you may be pregnant. Get help right away if:  You have abdominal pain that is severe or gets worse.  You cannot eat or drink without vomiting.  You suddenly develop a fever.  Your menstrual period is much heavier than usual. This information is not intended to replace advice given to you by your health care provider. Make sure you discuss any questions you have with your health care provider. Document Released: 06/03/2005 Document Revised: 12/22/2015  Document Reviewed: 11/05/2015 Elsevier Interactive Patient Education  [employer].

## 2016-12-11 ENCOUNTER — Encounter: Payer: Self-pay | Admitting: Gastroenterology

## 2016-12-11 LAB — COMPREHENSIVE METABOLIC PANEL
ALT: 73 IU/L — ABNORMAL HIGH (ref 0–32)
AST: 30 IU/L (ref 0–40)
Albumin/Globulin Ratio: 1.6 (ref 1.2–2.2)
Albumin: 4.5 g/dL (ref 3.5–5.5)
Alkaline Phosphatase: 153 IU/L — ABNORMAL HIGH (ref 39–117)
BUN/Creatinine Ratio: 16 (ref 9–23)
BUN: 12 mg/dL (ref 6–24)
Bilirubin Total: 0.4 mg/dL (ref 0.0–1.2)
CO2: 22 mmol/L (ref 20–29)
Calcium: 9.4 mg/dL (ref 8.7–10.2)
Chloride: 98 mmol/L (ref 96–106)
Creatinine, Ser: 0.77 mg/dL (ref 0.57–1.00)
GFR calc Af Amer: 107 mL/min/{1.73_m2} (ref >59)
GFR calc non Af Amer: 93 mL/min/{1.73_m2} (ref >59)
Globulin, Total: 2.8 g/dL (ref 1.5–4.5)
Glucose: 90 mg/dL (ref 65–99)
Potassium: 4.1 mmol/L (ref 3.5–5.2)
Sodium: 137 mmol/L (ref 134–144)
Total Protein: 7.3 g/dL (ref 6.0–8.5)

## 2016-12-17 ENCOUNTER — Encounter: Payer: Self-pay | Admitting: Obstetrics & Gynecology

## 2016-12-17 ENCOUNTER — Ambulatory Visit (INDEPENDENT_AMBULATORY_CARE_PROVIDER_SITE_OTHER): Payer: 59 | Admitting: Obstetrics & Gynecology

## 2016-12-17 VITALS — BP 126/78 | Ht 60.0 in | Wt 195.0 lb

## 2016-12-17 DIAGNOSIS — R1032 Left lower quadrant pain: Secondary | ICD-10-CM

## 2016-12-17 DIAGNOSIS — N83202 Unspecified ovarian cyst, left side: Secondary | ICD-10-CM | POA: Diagnosis not present

## 2016-12-17 DIAGNOSIS — A630 Anogenital (venereal) warts: Secondary | ICD-10-CM | POA: Diagnosis not present

## 2016-12-17 NOTE — Patient Instructions (Signed)
1. Cyst of left ovary 4.6 cm simple Lt Ovarian cyst, probably physiologic.  Decision to observe.  Repeat Pelvic US 01/2017.  Risks of cyst rupture and ovarian torsion discussed.  Will call for urgent evaluation if sudden severe pain develops.  - US Transvaginal Non-OB; Future  2. LLQ pain Possibly associated with Lt Ovarian cyst, or with constipation.  Recommend nutrition high in fibers, beans, water.  - US Transvaginal Non-OB; Future  3. Condyloma H/O Vulvar condylomas.  None seen on exam today.  Patient reassured.  Condom use with next partner strongly recommended.  Will have Annual/Gyn exam early August with Pelvic US visit.  Helen RowerLiesa, it was a pleasure to meet you today.  Please call me if you develop sudden severe pelvic pain, otherwise I will see you again in August.

## 2016-12-17 NOTE — Progress Notes (Signed)
    Helen Greene Mar 25, 1971 [id number]        46 y.o.  [id number]  Divorced.  New relationship, not sexually active yet.  RP:  LLQ Pain with Lt pelvic pain  HPI:  Had sudden severe pain in LLQ on June 22nd.  Patient was seen at the ER and a CT scan, then a Pelvic US were done revealing a 4.6 cm Left Ovarian Cyst, probably physiologic.  No evidence of Bowel inflammation or obstruction on CT scan, but patient was told that she had a lot of fecal matter in her bowels.  She did have constipation, which she is still struggling with.  Had a 1st BM in a week on 6/28th after eating refried beans and 1 BM yesterday 7/2nd.  No rectal bleeding.  SUI for which she has an appointment with Dr Sherron MondayMacdiarmid end of July.  Robotic TLH with Left Salpingectomy with Dr Ernestina PennaFogleman 09/14/2015 for Left pelvic pain worsening and persisting post Essure displacement.  No Left Pelvic pain Post op until 6/22nd.  Past medical history,surgical history, problem list, medications, allergies, family history and social history were all reviewed and documented in the EPIC chart.  Directed ROS with pertinent positives and negatives documented in the history of present illness/assessment and plan.  Exam:  Vitals:   12/17/16 0829  BP: 126/78  Weight: 195 lb (88.5 kg)  Height: 5' (1.524 m)   General appearance:  Normal  Gyn exam:  Vulva normal, no condyloma seen                     Bimanual exam:  S/P Hysterectomy.  Vagina well supported.  No pelvic mass felt.  Mildly tender with abdominal hand pressure, but Rt similar to Lt.  Pelvic US 12/07/2016:  Uterus absent S/P Hysterectomy.  Right ovary:  Not seen.  Left ovary:  Measurements: 5.0 x 3.8 x 3.9 cm. There is a 4.6 x 2.8 x 2.9 cm cyst in the left adnexa corresponding to the cyst seen on the CT.  Doppler images demonstrate presence of arterial and venous flow to the periphery of this cyst, likely to the ovarian parenchyma. No definite intracystic solid component or mural solid nodule  noted. No flow within the cyst. This likely represents a physiologic cyst. No abnormal free fluid.  Assessment/Plan:  46 y.o. [id number]   1. Cyst of left ovary 4.6 cm simple Lt Ovarian cyst, probably physiologic.  Decision to observe.  Repeat Pelvic US 01/2017.  Risks of cyst rupture and ovarian torsion discussed.  Will call for urgent evaluation if sudden severe pain develops.  - US Transvaginal Non-OB; Future  2. LLQ pain Possibly associated with Lt Ovarian cyst, or with constipation.  Recommend nutrition high in fibers, beans, water.  - US Transvaginal Non-OB; Future  3. Condyloma H/O Vulvar condylomas.  None seen on exam today.  Patient reassured.  Condom use with next partner strongly recommended.  Will have Annual/Gyn exam early August with Pelvic US visit.  Counseling on above issues >50% x 30 minutes.  Genia DelMarie-Lyne Marshall Roehrich MD, 8:45 AM 12/17/2016

## 2017-01-01 ENCOUNTER — Ambulatory Visit: Payer: 59 | Admitting: Family Medicine

## 2017-01-14 ENCOUNTER — Encounter: Payer: Self-pay | Admitting: Gastroenterology

## 2017-01-14 ENCOUNTER — Ambulatory Visit (INDEPENDENT_AMBULATORY_CARE_PROVIDER_SITE_OTHER): Payer: 59 | Admitting: Family Medicine

## 2017-01-14 ENCOUNTER — Encounter: Payer: Self-pay | Admitting: Family Medicine

## 2017-01-14 VITALS — BP 113/79 | HR 79 | Temp 98.7°F | Resp 17 | Ht 60.5 in | Wt 194.8 lb

## 2017-01-14 DIAGNOSIS — F411 Generalized anxiety disorder: Secondary | ICD-10-CM | POA: Diagnosis not present

## 2017-01-14 DIAGNOSIS — E6609 Other obesity due to excess calories: Secondary | ICD-10-CM

## 2017-01-14 DIAGNOSIS — Z760 Encounter for issue of repeat prescription: Secondary | ICD-10-CM | POA: Diagnosis not present

## 2017-01-14 DIAGNOSIS — R740 Nonspecific elevation of levels of transaminase and lactic acid dehydrogenase [LDH]: Secondary | ICD-10-CM

## 2017-01-14 DIAGNOSIS — Z6837 Body mass index (BMI) 37.0-37.9, adult: Secondary | ICD-10-CM | POA: Diagnosis not present

## 2017-01-14 DIAGNOSIS — R7401 Elevation of levels of liver transaminase levels: Secondary | ICD-10-CM

## 2017-01-14 DIAGNOSIS — D72828 Other elevated white blood cell count: Secondary | ICD-10-CM | POA: Diagnosis not present

## 2017-01-14 DIAGNOSIS — G2581 Restless legs syndrome: Secondary | ICD-10-CM | POA: Diagnosis not present

## 2017-01-14 MED ORDER — FOLIC ACID 1 MG PO TABS: 1.0000 mg | ORAL_TABLET | Freq: Every day | ORAL | 1 refills | Status: DC

## 2017-01-14 MED ORDER — ESCITALOPRAM OXALATE 10 MG PO TABS: 10.0000 mg | ORAL_TABLET | Freq: Every day | ORAL | 1 refills | Status: DC

## 2017-01-14 MED ORDER — BUPROPION HCL ER (XL) 300 MG PO TB24: 300.0000 mg | ORAL_TABLET | Freq: Every day | ORAL | 1 refills | Status: DC

## 2017-01-14 MED ORDER — IBUPROFEN 800 MG PO TABS: 800.0000 mg | ORAL_TABLET | Freq: Three times a day (TID) | ORAL | 1 refills | Status: DC | PRN

## 2017-01-14 MED ORDER — PRAMIPEXOLE DIHYDROCHLORIDE 1.5 MG PO TABS: 1.5000 mg | ORAL_TABLET | Freq: Every day | ORAL | 1 refills | Status: DC

## 2017-01-14 MED ORDER — CYCLOBENZAPRINE HCL 5 MG PO TABS: 5.0000 mg | ORAL_TABLET | Freq: Three times a day (TID) | ORAL | 6 refills | Status: DC | PRN

## 2017-01-14 NOTE — Progress Notes (Signed)
Chief Complaint  Patient presents with  . Follow-up    restless legs (both)  . Medication Refill    bupropion hcl, lexapro, fovite, hydroxyzine hcl, ibuprofen, pramipexole dihydrochloride    HPI  Pt reports that she has a follow up appt with Gynecology for [place] and her annual pap smear   Her elevated liver enzymes need to be checked She also had a high wbc that needs to be rechecked She reports that she is feeling better overall   Anxiety She is now stable on her wellbutrin She reports that she has been going to counseling but had to take a break due to copays She denies side effects of wellbutrin   Restless leg syndrome Pt reports that she is doing well with mirapex She states that her restless leg is better Her hemoglobin was normal last visit.   She is not exercising but going to PT She reports that she has been holding steady with her weight Wt Readings from Last 3 Encounters:  01/14/17 194 lb 12.8 oz (88.4 kg)  12/17/16 195 lb (88.5 kg)  12/10/16 194 lb (88 kg)    Past Medical History:  Diagnosis Date  . Allergy   . Anxiety   . Complication of anesthesia   . Depression   . Elevated white blood cell count   . Elevated white blood cell count 09/25/2011  . GERD (gastroesophageal reflux disease)   . Headache    Migraine  . Hypertension   . Kidney stone   . Neuromuscular disorder (HCC)    carpel tunnel disease  . PONV (postoperative nausea and vomiting)   . Restless leg syndrome   . Shortness of breath dyspnea    with exertion  . Sleep apnea    does not have CPAP machine, mostly affects back sleeping was encouraged to sleep on her sides    Current Outpatient Prescriptions  Medication Sig Dispense Refill  . buPROPion (WELLBUTRIN XL) 300 MG 24 hr tablet Take 1 tablet (300 mg total) by mouth daily. 90 tablet 1  . clotrimazole-betamethasone (LOTRISONE) cream Apply 1 application topically daily as needed (rash).     . cyclobenzaprine (FLEXERIL) 5 MG tablet  Take 1 tablet (5 mg total) by mouth 3 (three) times daily as needed for muscle spasms. 90 tablet 6  . escitalopram (LEXAPRO) 10 MG tablet Take 1 tablet (10 mg total) by mouth daily. 90 tablet 1  . folic acid (FOLVITE) 1 MG tablet Take 1 tablet (1 mg total) by mouth daily. 90 tablet 1  . hydrOXYzine (ATARAX/VISTARIL) 25 MG tablet Take 25 mg by mouth 3 (three) times daily as needed for anxiety or vomiting.    Marland Kitchen ibuprofen (ADVIL,MOTRIN) 800 MG tablet Take 1 tablet (800 mg total) by mouth every 8 (eight) hours as needed. 90 tablet 1  . lisinopril-hydrochlorothiazide (PRINZIDE,ZESTORETIC) 10-12.5 MG tablet Take 1 tablet by mouth daily. 90 tablet 3  . nystatin (MYCOSTATIN/NYSTOP) powder Apply topically 2 (two) times daily. 15 g 1  . pramipexole (MIRAPEX) 1.5 MG tablet Take 1 tablet (1.5 mg total) by mouth at bedtime. 90 tablet 1  . valACYclovir (VALTREX) 1000 MG tablet TAKE 2 TABLETS BY MOUTH EVERY 12 HOURS FOR ONE DAY WITH OUTBREAK 20 tablet 0   No current facility-administered medications for this visit.     Allergies:  Allergies  Allergen Reactions  . Bee Venom Hives and Swelling  . Enablex [Darifenacin Hydrobromide] Other (See Comments)    Phlebitis.  . Erythromycin Hives  . Macrobid Dynegy  Monohyd Macro] Nausea And Vomiting  . Nitrofuran Derivatives Nausea And Vomiting    Past Surgical History:  Procedure Laterality Date  . CHOLECYSTECTOMY    . COLONOSCOPY    . DILATION AND CURETTAGE OF UTERUS  1993  . ECTOPIC PREGNANCY SURGERY    . ESSURE TUBAL LIGATION    . LITHOTRIPSY    . ROBOTIC ASSISTED TOTAL HYSTERECTOMY WITH SALPINGECTOMY Left 09/14/2015   Procedure: ROBOTIC ASSISTED TOTAL HYSTERECTOMY WITH LEFT SALPINGECTOMY;  Surgeon: Noland FordyceKelly Fogleman, MD;  Location: [facility];  Service: Gynecology;  Laterality: Left;  . UPPER GI ENDOSCOPY    . WISDOM TOOTH EXTRACTION      Social History   Social History  . Marital status: Divorced    Spouse name: N/A  . Number of children: N/A    . Years of education: N/A   Social History Main Topics  . Smoking status: Never Smoker  . Smokeless tobacco: Never Used  . Alcohol use No  . Drug use: No  . Sexual activity: Not Currently    Partners: Male     Comment: 1ST intercourse- 18, partners-  2    Other Topics Concern  . None   Social History Narrative  . None    ROS Review of Systems See HPI Constitution: No fevers or chills No malaise No diaphoresis Skin: No rash or itching Eyes: no blurry vision, no double vision GU: no dysuria or hematuria Neuro: no dizziness or headaches  Objective: Vitals:   01/14/17 1743  BP: 113/79  Pulse: 79  Resp: 17  Temp: 98.7 F (37.1 C)  TempSrc: Oral  SpO2: 97%  Weight: 194 lb 12.8 oz (88.4 kg)  Height: 5' 0.5" (1.537 m)  Body mass index is 37.42 kg/m.   Physical Exam Physical Exam  Constitutional: She is oriented to person, place, and time. She appears well-developed and well-nourished.  HENT:  Head: Normocephalic and atraumatic.  Eyes: Conjunctivae and EOM are normal.  Cardiovascular: Normal rate, regular rhythm and normal heart sounds.   Pulmonary/Chest: Effort normal and breath sounds normal. No respiratory distress. She has no wheezes.  Abdominal: Normal appearance and bowel sounds are normal. There is no tenderness. There is no CVA tenderness.  Neurological: She is alert and oriented to person, place, and time.     Assessment and Plan Sofie RowerLiesa was seen today for follow-up and medication refill.  Diagnoses and all orders for this visit:  Other elevated white blood cell (WBC) count- will reassess  Elevated transaminase level- will reassess  Medication refill -     ibuprofen (ADVIL,MOTRIN) 800 MG tablet; Take 1 tablet (800 mg total) by mouth every 8 (eight) hours as needed.  RLS (restless legs syndrome)-  Discussed that if her symptoms get worse we can adjust his dose -     cyclobenzaprine (FLEXERIL) 5 MG tablet; Take 1 tablet (5 mg total) by mouth 3  (three) times daily as needed for muscle spasms. -     pramipexole (MIRAPEX) 1.5 MG tablet; Take 1 tablet (1.5 mg total) by mouth at bedtime.  Generalized anxiety disorder- improved with counseling, anxiety meds and dating -     buPROPion (WELLBUTRIN XL) 300 MG 24 hr tablet; Take 1 tablet (300 mg total) by mouth daily. -     escitalopram (LEXAPRO) 10 MG tablet; Take 1 tablet (10 mg total) by mouth daily.  Class 2 obesity due to excess calories without serious comorbidity with body mass index (BMI) of 37.0 to 37.9 in adult -  Weight  improving  Pt cutting back and doing portion control  Other orders  -     folic acid (FOLVITE) 1 MG tablet; Take 1 tablet (1 mg total) by mouth daily.      Roben Schliep A Erian Rosengren

## 2017-01-14 NOTE — Patient Instructions (Addendum)
IF you received an x-ray today, you will receive an invoice from [facility]. Please contact [facility] at [phone] with questions or concerns regarding your invoice.   IF you received labwork today, you will receive an invoice from [facility]. Please contact [facility] at [phone] with questions or concerns regarding your invoice.   Our billing staff will not be able to assist you with questions regarding bills from these companies.  You will be contacted with the lab results as soon as they are available. The fastest way to get your results is to activate your My Chart account. Instructions are located on the last page of this paperwork. If you have not heard from us regarding the results in 2 weeks, please contact this office.    We recommend that you schedule a mammogram for breast cancer screening. Typically, you do not need a referral to do this. Please contact a local imaging center to schedule your mammogram.  [facility] - [phone]  *ask for the Radiology Department [facility] - [phone] or [phone]  [facility] - [phone] [facility] - [phone] [facility] - [phone]  *ask for the Radiology Department [facility] - [phone]  *ask for the Radiology Department [facility] - [phone]  *ask for the Mammography Department [facility] - [phone] Legs Syndrome Restless legs syndrome is a condition that causes uncomfortable feelings or sensations in the legs, especially while sitting or lying down. The sensations usually cause an overwhelming urge to move the legs. The arms can also sometimes be affected. The condition can range from mild to severe. The symptoms often interfere with a person's ability to sleep. What are the causes? The cause of this condition is not known. What increases the risk? This  condition is more likely to develop in:  People who are older than age 47.  Pregnant women. In general, restless legs syndrome is more common in women than in men.  People who have a family history of the condition.  People who have certain medical conditions, such as iron deficiency, kidney disease, Parkinson disease, or nerve damage.  People who take certain medicines, such as medicines for high blood pressure, nausea, colds, allergies, depression, and some heart conditions.  What are the signs or symptoms? The main symptom of this condition is uncomfortable sensations in the legs. These sensations may be:  Described as pulling, tingling, prickling, throbbing, crawling, or burning.  Worse while you are sitting or lying down.  Worse during periods of rest or inactivity.  Worse at night, often interfering with your sleep.  Accompanied by a very strong urge to move your legs.  Temporarily relieved by movement of your legs.  The sensations usually affect both sides of the body. The arms can also be affected, but this is rare. People who have this condition often have tiredness during the day because of their lack of sleep at night. How is this diagnosed? This condition may be diagnosed based on your description of the symptoms. You may also have tests, including blood tests, to check for other conditions that may lead to your symptoms. In some cases, you may be asked to spend some time in a sleep lab so your sleeping can be monitored. How is this treated? Treatment for this condition is focused on managing the symptoms. Treatment may include:  Self-help and lifestyle changes.  Medicines.  Follow these instructions  at home:  Take medicines only as directed by your health care provider.  Try these methods to get temporary relief from the uncomfortable sensations: ? Massage your legs. ? Walk or stretch. ? Take a cold or hot bath.  Practice good sleep habits. For example, go  to bed and get up at the same time every day.  Exercise regularly.  Practice ways of relaxing, such as yoga or meditation.  Avoid caffeine and alcohol.  Do not use any tobacco products, including cigarettes, chewing tobacco, or electronic cigarettes. If you need help quitting, ask your health care provider.  Keep all follow-up visits as directed by your health care provider. This is important. Contact a health care provider if: Your symptoms do not improve with treatment, or they get worse. This information is not intended to replace advice given to you by your health care provider. Make sure you discuss any questions you have with your health care provider. Document Released: 05/24/2002 Document Revised: 11/09/2015 Document Reviewed: 05/30/2014 Elsevier Interactive Patient Education  [employer]2018 [employer]

## 2017-01-15 ENCOUNTER — Ambulatory Visit (INDEPENDENT_AMBULATORY_CARE_PROVIDER_SITE_OTHER): Payer: 59

## 2017-01-15 ENCOUNTER — Encounter: Payer: Self-pay | Admitting: Obstetrics & Gynecology

## 2017-01-15 ENCOUNTER — Other Ambulatory Visit: Payer: 59

## 2017-01-15 ENCOUNTER — Ambulatory Visit (INDEPENDENT_AMBULATORY_CARE_PROVIDER_SITE_OTHER): Payer: 59 | Admitting: Obstetrics & Gynecology

## 2017-01-15 ENCOUNTER — Encounter: Payer: 59 | Admitting: Obstetrics & Gynecology

## 2017-01-15 ENCOUNTER — Other Ambulatory Visit: Payer: Self-pay | Admitting: Obstetrics & Gynecology

## 2017-01-15 VITALS — BP 122/70 | Ht 60.0 in | Wt 193.8 lb

## 2017-01-15 DIAGNOSIS — Z01411 Encounter for gynecological examination (general) (routine) with abnormal findings: Secondary | ICD-10-CM

## 2017-01-15 DIAGNOSIS — N83202 Unspecified ovarian cyst, left side: Secondary | ICD-10-CM | POA: Diagnosis not present

## 2017-01-15 DIAGNOSIS — Z90710 Acquired absence of both cervix and uterus: Secondary | ICD-10-CM

## 2017-01-15 DIAGNOSIS — N9412 Deep dyspareunia: Secondary | ICD-10-CM | POA: Diagnosis not present

## 2017-01-15 LAB — CBC WITH DIFFERENTIAL/PLATELET
Basophils Absolute: 0 10*3/uL (ref 0.0–0.2)
Basos: 0 %
EOS (ABSOLUTE): 0.1 10*3/uL (ref 0.0–0.4)
Eos: 1 %
Hematocrit: 42.8 % (ref 34.0–46.6)
Hemoglobin: 14.3 g/dL (ref 11.1–15.9)
IMMATURE GRANULOCYTES: 0 %
Immature Grans (Abs): 0 10*3/uL (ref 0.0–0.1)
LYMPHS ABS: 3.8 10*3/uL — AB (ref 0.7–3.1)
Lymphs: 26 %
MCH: 30.1 pg (ref 26.6–33.0)
MCHC: 33.4 g/dL (ref 31.5–35.7)
MCV: 90 fL (ref 79–97)
MONOS ABS: 0.6 10*3/uL (ref 0.1–0.9)
Monocytes: 4 %
NEUTROS PCT: 69 %
Neutrophils Absolute: 10 10*3/uL — ABNORMAL HIGH (ref 1.4–7.0)
PLATELETS: 387 10*3/uL — AB (ref 150–379)
RBC: 4.75 x10E6/uL (ref 3.77–5.28)
RDW: 13.2 % (ref 12.3–15.4)
WBC: 14.7 10*3/uL — AB (ref 3.4–10.8)

## 2017-01-15 LAB — COMPREHENSIVE METABOLIC PANEL
A/G RATIO: 1.5 (ref 1.2–2.2)
ALT: 14 IU/L (ref 0–32)
AST: 17 IU/L (ref 0–40)
Albumin: 4.4 g/dL (ref 3.5–5.5)
Alkaline Phosphatase: 98 IU/L (ref 39–117)
BUN/Creatinine Ratio: 15 (ref 9–23)
BUN: 12 mg/dL (ref 6–24)
Bilirubin Total: 0.4 mg/dL (ref 0.0–1.2)
CALCIUM: 9.7 mg/dL (ref 8.7–10.2)
CO2: 22 mmol/L (ref 20–29)
Chloride: 98 mmol/L (ref 96–106)
Creatinine, Ser: 0.78 mg/dL (ref 0.57–1.00)
GFR calc Af Amer: 105 mL/min/{1.73_m2} (ref >59)
GFR, EST NON AFRICAN AMERICAN: 91 mL/min/{1.73_m2} (ref >59)
GLOBULIN, TOTAL: 3 g/dL (ref 1.5–4.5)
Glucose: 90 mg/dL (ref 65–99)
POTASSIUM: 4.1 mmol/L (ref 3.5–5.2)
SODIUM: 139 mmol/L (ref 134–144)
Total Protein: 7.4 g/dL (ref 6.0–8.5)

## 2017-01-15 NOTE — Progress Notes (Signed)
Helen Greene 11/28/1970 [id number]   History:    46 y.o.  G3P2A1 Divorced.  New relationship.  RP:  Established patient presenting for annual gyn exam and Pelvic US  HPI:  Seen on July 3rd after a sudden severe pain in LLQ on June 22nd for which she presented at the ER and a CT scan, then a Pelvic US was done revealing a 4.6 cm Left Ovarian Cyst, probably physiologic.  No evidence of Bowel inflammation or obstruction on CT scan, but patient was told that she had a lot of fecal matter in her bowels.  Currently no abdomino-pelvic pain, but feels midline pelvic pain with deep IC.  S/P Robotic TLH with Left Salpingectomy 08/2015 for Left pelvic pain worsening and persisting post Essure displacement.  Breasts wnl.  Mictions/BMs wnl.  Past medical history,surgical history, family history and social history were all reviewed and documented in the EPIC chart.  Gynecologic History Patient's last menstrual period was 09/07/2015 (exact date). Contraception: status post hysterectomy Last Pap: 2016. Results were: normal Last mammogram: ?, will schedule.  Obstetric History OB History  Gravida Para Term Preterm AB Living  3 2     1 2   SAB TAB Ectopic Multiple Live Births      1        # Outcome Date GA Lbr Len/2nd Weight Sex Delivery Anes PTL Lv  3 Ectopic           2 Para           1 Para                ROS: A ROS was performed and pertinent positives and negatives are included in the history.  GENERAL: No fevers or chills. HEENT: No change in vision, no earache, sore throat or sinus congestion. NECK: No pain or stiffness. CARDIOVASCULAR: No chest pain or pressure. No palpitations. PULMONARY: No shortness of breath, cough or wheeze. GASTROINTESTINAL: No abdominal pain, nausea, vomiting or diarrhea, melena or bright red blood per rectum. GENITOURINARY: No urinary frequency, urgency, hesitancy or dysuria. MUSCULOSKELETAL: No joint or muscle pain, no back pain, no recent trauma. DERMATOLOGIC: No  rash, no itching, no lesions. ENDOCRINE: No polyuria, polydipsia, no heat or cold intolerance. No recent change in weight. HEMATOLOGICAL: No anemia or easy bruising or bleeding. NEUROLOGIC: No headache, seizures, numbness, tingling or weakness. PSYCHIATRIC: No depression, no loss of interest in normal activity or change in sleep pattern.     Exam:   BP 122/70   Ht 5' (1.524 m)   Wt 193 lb 12.8 oz (87.9 kg)   LMP 09/07/2015 (Exact Date)   BMI 37.85 kg/m   Body mass index is 37.85 kg/m.  General appearance : Well developed well nourished female. No acute distress HEENT: Eyes: no retinal hemorrhage or exudates,  Neck supple, trachea midline, no carotid bruits, no thyroidmegaly Lungs: Clear to auscultation, no rhonchi or wheezes, or rib retractions  Heart: Regular rate and rhythm, no murmurs or gallops Breast:Examined in sitting and supine position were symmetrical in appearance, no palpable masses or tenderness,  no skin retraction, no nipple inversion, no nipple discharge, no skin discoloration, no axillary or supraclavicular lymphadenopathy Abdomen: no palpable masses or tenderness, no rebound or guarding Extremities: no edema or skin discoloration or tenderness  Pelvic: Vulva normal  Bartholin, Urethra, Skene Glands: Within normal limits             Vagina: No gross lesions or discharge.  Pap/HPV done.  Cervix/Uterus Absent.  No pelvic mass or Tenderness at vaginal vault.  Adnexa  Without masses or tenderness  Anus and perineum  normal   Pelvic US today:  T/V and T/A S/P Hysterectomy.  Vaginal cuff normal.  Right Ovary normal.  Left Ovary normal with a 1.1 x 0.9 cm follicle.  Previous Left Ovarian cyst not seen.  No mass seen in Right and Left Adnexae.  No FF in CDS.  Assessment/Plan:  46 y.o. female for annual exam   1. Encounter for gynecological examination with abnormal finding Gyn exam s/p Hysterectomy.  Pap/HPV done.  Breasts wnl.  Will schedule screening Mammo.  2.  History of robot-assisted laparoscopic hysterectomy Resolved Pelvic Pain.  3. Cyst of left ovary  Pelvic US today normal, resolved Left Ovarian Cyst.  Results reviewed, patient reassured.  4. Deep dyspareunia Recently sexually active again.  Per patient, partner has a long penis.  Deep midline pain with deep IC.  Counseling done on change in position and avoiding extremes in penetration depth.  Counseling on above issues >50% x 15 minutes.  Genia DelMarie-Lyne Sequita Wise MD, 12:29 PM 01/15/2017

## 2017-01-15 NOTE — Patient Instructions (Addendum)
1. Encounter for gynecological examination with abnormal finding Gyn exam s/p Hysterectomy.  Pap/HPV done.  Breasts wnl.  Will schedule screening Mammo.  2. History of robot-assisted laparoscopic hysterectomy Resolved Pelvic Pain.  3. Cyst of left ovary  Pelvic US today normal, resolved Left Ovarian Cyst.  Results reviewed, patient reassured.  4. Deep dyspareunia Recently sexually active again.  Per patient, partner has a long penis.  Deep midline pain with deep IC.  Counseling done on change in position and avoiding extremes in penetration depth.  Helen Greene, it was good to see you again today!  I will inform you of your results as soon as available.  Health Maintenance, Female Adopting a healthy lifestyle and getting preventive care can go a long way to promote health and wellness. Talk with your health care provider about what schedule of regular examinations is right for you. This is a good chance for you to check in with your provider about disease prevention and staying healthy. In between checkups, there are plenty of things you can do on your own. Experts have done a lot of research about which lifestyle changes and preventive measures are most likely to keep you healthy. Ask your health care provider for more information. Weight and diet Eat a healthy diet  Be sure to include plenty of vegetables, fruits, low-fat dairy products, and lean protein.  Do not eat a lot of foods high in solid fats, added sugars, or salt.  Get regular exercise. This is one of the most important things you can do for your health. ? Most adults should exercise for at least 150 minutes each week. The exercise should increase your heart rate and make you sweat (moderate-intensity exercise). ? Most adults should also do strengthening exercises at least twice a week. This is in addition to the moderate-intensity exercise.  Maintain a healthy weight  Body mass index (BMI) is a measurement that can be used to  identify possible weight problems. It estimates body fat based on height and weight. Your health care provider can help determine your BMI and help you achieve or maintain a healthy weight.  For females 25 years of age and older: ? A BMI below 18.5 is considered underweight. ? A BMI of 18.5 to 24.9 is normal. ? A BMI of 25 to 29.9 is considered overweight. ? A BMI of 30 and above is considered obese.  Watch levels of cholesterol and blood lipids  You should start having your blood tested for lipids and cholesterol at 47 years of age, then have this test every 5 years.  You may need to have your cholesterol levels checked more often if: ? Your lipid or cholesterol levels are high. ? You are older than 47 years of age. ? You are at high risk for heart disease.  Cancer screening Lung Cancer  Lung cancer screening is recommended for adults 82-48 years old who are at high risk for lung cancer because of a history of smoking.  A yearly low-dose CT scan of the lungs is recommended for people who: ? Currently smoke. ? Have quit within the past 15 years. ? Have at least a 30-pack-year history of smoking. A pack year is smoking an average of one pack of cigarettes a day for 1 year.  Yearly screening should continue until it has been 15 years since you quit.  Yearly screening should stop if you develop a health problem that would prevent you from having lung cancer treatment.  Breast Cancer  Practice  This means understanding how your breasts normally appear and feel.  It also means doing regular breast self-exams. Let your health care provider know about any changes, no matter how small.  If you are in your 20s or 30s, you should have a clinical breast exam (CBE) by a health care provider every 1-3 years as part of a regular health exam.  If you are 40 or older, have a CBE every year. Also consider having a breast X-ray (mammogram) every year.  If you have a  family history of breast cancer, talk to your health care provider about genetic screening.  If you are at high risk for breast cancer, talk to your health care provider about having an MRI and a mammogram every year.  Breast cancer gene (BRCA) assessment is recommended for women who have family members with BRCA-related cancers. BRCA-related cancers include: ? Breast. ? Ovarian. ? Tubal. ? Peritoneal cancers.  Results of the assessment will determine the need for genetic counseling and BRCA1 and BRCA2 testing.  Cervical Cancer Your health care provider may recommend that you be screened regularly for cancer of the pelvic organs (ovaries, uterus, and vagina). This screening involves a pelvic examination, including checking for microscopic changes to the surface of your cervix (Pap test). You may be encouraged to have this screening done every 3 years, beginning at age 21.  For women ages 30-65, health care providers may recommend pelvic exams and Pap testing every 3 years, or they may recommend the Pap and pelvic exam, combined with testing for human papilloma virus (HPV), every 5 years. Some types of HPV increase your risk of cervical cancer. Testing for HPV may also be done on women of any age with unclear Pap test results.  Other health care providers may not recommend any screening for nonpregnant women who are considered low risk for pelvic cancer and who do not have symptoms. Ask your health care provider if a screening pelvic exam is right for you.  If you have had past treatment for cervical cancer or a condition that could lead to cancer, you need Pap tests and screening for cancer for at least 20 years after your treatment. If Pap tests have been discontinued, your risk factors (such as having a new sexual partner) need to be reassessed to determine if screening should resume. Some women have medical problems that increase the chance of getting cervical cancer. In these cases, your  health care provider may recommend more frequent screening and Pap tests.  Colorectal Cancer  This type of cancer can be detected and often prevented.  Routine colorectal cancer screening usually begins at 47 years of age and continues through 47 years of age.  Your health care provider may recommend screening at an earlier age if you have risk factors for colon cancer.  Your health care provider may also recommend using home test kits to check for hidden blood in the stool.  A small camera at the end of a tube can be used to examine your colon directly (sigmoidoscopy or colonoscopy). This is done to check for the earliest forms of colorectal cancer.  Routine screening usually begins at age 50.  Direct examination of the colon should be repeated every 5-10 years through 47 years of age. However, you may need to be screened more often if early forms of precancerous polyps or small growths are found.  Skin Cancer  Check your skin from head to toe regularly.  Tell your health care provider about any   new moles or changes in moles, especially if there is a change in a mole's shape or color.  Also tell your health care provider if you have a mole that is larger than the size of a pencil eraser.  Always use sunscreen. Apply sunscreen liberally and repeatedly throughout the day.  Protect yourself by wearing long sleeves, pants, a wide-brimmed hat, and sunglasses whenever you are outside.  Heart disease, diabetes, and high blood pressure  High blood pressure causes heart disease and increases the risk of stroke. High blood pressure is more likely to develop in: ? People who have blood pressure in the high end of the normal range (130-139/85-89 mm Hg). ? People who are overweight or obese. ? People who are African American.  If you are 18-39 years of age, have your blood pressure checked every 3-5 years. If you are 40 years of age or older, have your blood pressure checked every year. You  should have your blood pressure measured twice-once when you are at a hospital or clinic, and once when you are not at a hospital or clinic. Record the average of the two measurements. To check your blood pressure when you are not at a hospital or clinic, you can use: ? An automated blood pressure machine at a pharmacy. ? A home blood pressure monitor.  If you are between 55 years and 79 years old, ask your health care provider if you should take aspirin to prevent strokes.  Have regular diabetes screenings. This involves taking a blood sample to check your fasting blood sugar level. ? If you are at a normal weight and have a low risk for diabetes, have this test once every three years after 47 years of age. ? If you are overweight and have a high risk for diabetes, consider being tested at a younger age or more often. Preventing infection Hepatitis B  If you have a higher risk for hepatitis B, you should be screened for this virus. You are considered at high risk for hepatitis B if: ? You were born in a country where hepatitis B is common. Ask your health care provider which countries are considered high risk. ? Your parents were born in a high-risk country, and you have not been immunized against hepatitis B (hepatitis B vaccine). ? You have HIV or AIDS. ? You use needles to inject street drugs. ? You live with someone who has hepatitis B. ? You have had sex with someone who has hepatitis B. ? You get hemodialysis treatment. ? You take certain medicines for conditions, including cancer, organ transplantation, and autoimmune conditions.  Hepatitis C  Blood testing is recommended for: ? Everyone born from 1945 through 1965. ? Anyone with known risk factors for hepatitis C.  Sexually transmitted infections (STIs)  You should be screened for sexually transmitted infections (STIs) including gonorrhea and chlamydia if: ? You are sexually active and are younger than 47 years of age. ? You  are older than 47 years of age and your health care provider tells you that you are at risk for this type of infection. ? Your sexual activity has changed since you were last screened and you are at an increased risk for chlamydia or gonorrhea. Ask your health care provider if you are at risk.  If you do not have HIV, but are at risk, it may be recommended that you take a prescription medicine daily to prevent HIV infection. This is called pre-exposure prophylaxis (PrEP). You are considered at   risk if: ? You are sexually active and do not regularly use condoms or know the HIV status of your partner(s). ? You take drugs by injection. ? You are sexually active with a partner who has HIV.  Talk with your health care provider about whether you are at high risk of being infected with HIV. If you choose to begin PrEP, you should first be tested for HIV. You should then be tested every 3 months for as long as you are taking PrEP. Pregnancy  If you are premenopausal and you may become pregnant, ask your health care provider about preconception counseling.  If you may become pregnant, take 400 to 800 micrograms (mcg) of folic acid every day.  If you want to prevent pregnancy, talk to your health care provider about birth control (contraception). Osteoporosis and menopause  Osteoporosis is a disease in which the bones lose minerals and strength with aging. This can result in serious bone fractures. Your risk for osteoporosis can be identified using a bone density scan.  If you are 65 years of age or older, or if you are at risk for osteoporosis and fractures, ask your health care provider if you should be screened.  Ask your health care provider whether you should take a calcium or vitamin D supplement to lower your risk for osteoporosis.  Menopause may have certain physical symptoms and risks.  Hormone replacement therapy may reduce some of these symptoms and risks. Talk to your health care  provider about whether hormone replacement therapy is right for you. Follow these instructions at home:  Schedule regular health, dental, and eye exams.  Stay current with your immunizations.  Do not use any tobacco products including cigarettes, chewing tobacco, or electronic cigarettes.  If you are pregnant, do not drink alcohol.  If you are breastfeeding, limit how much and how often you drink alcohol.  Limit alcohol intake to no more than 1 drink per day for nonpregnant women. One drink equals 12 ounces of beer, 5 ounces of wine, or 1 ounces of hard liquor.  Do not use street drugs.  Do not share needles.  Ask your health care provider for help if you need support or information about quitting drugs.  Tell your health care provider if you often feel depressed.  Tell your health care provider if you have ever been abused or do not feel safe at home. This information is not intended to replace advice given to you by your health care provider. Make sure you discuss any questions you have with your health care provider. Document Released: 12/17/2010 Document Revised: 11/09/2015 Document Reviewed: 03/07/2015 Elsevier Interactive Patient Education  2018 [employer]  

## 2017-01-20 ENCOUNTER — Telehealth: Payer: Self-pay | Admitting: Family Medicine

## 2017-01-20 LAB — PAP, TP IMAGING W/ HPV RNA, RFLX HPV TYPE 16,18/45: HPV MRNA, HIGH RISK: NOT DETECTED

## 2017-01-20 NOTE — Telephone Encounter (Signed)
Patient states she forgot to ask Dr Creta LevinStallings when she saw her last if [facility] [email or web address]  Her call back number is [phone]

## 2017-01-21 MED ORDER — MAGIC MOUTHWASH W/LIDOCAINE: 5.0000 mL | Freq: Three times a day (TID) | ORAL | 0 refills | Status: DC | PRN

## 2017-01-21 NOTE — Telephone Encounter (Signed)
Medication sent in. 

## 2017-01-21 NOTE — Telephone Encounter (Signed)
See note below. Patient last seen 01/14/17. Mouthwash not mentioned in encounter. Please advise.

## 2017-01-23 ENCOUNTER — Other Ambulatory Visit: Payer: Self-pay | Admitting: Family Medicine

## 2017-01-28 ENCOUNTER — Telehealth: Payer: Self-pay | Admitting: *Deleted

## 2017-01-28 ENCOUNTER — Ambulatory Visit: Payer: 59 | Admitting: Gastroenterology

## 2017-01-28 NOTE — Telephone Encounter (Signed)
Pt saw her pap results on my chart and it mentioned "Shift in flora suggestive of bacterial vaginosis. " pt would like to know if Rx should be sent for BV? Please advise

## 2017-01-29 NOTE — Telephone Encounter (Signed)
Left message for pt to call.

## 2017-01-29 NOTE — Telephone Encounter (Signed)
Pt informed

## 2017-01-29 NOTE — Telephone Encounter (Signed)
Please tell her that it is not a firm Dx of BV.  If she feels an increased d/c and has odor, we can send Tindamax 4 tab PO qd x 2 days.  #8  She may need Diflucan 150 mg PO x 1 tab for Yeast after.  If no symptom, I recommend to observe or come for a wet prep.

## 2017-01-30 ENCOUNTER — Ambulatory Visit (INDEPENDENT_AMBULATORY_CARE_PROVIDER_SITE_OTHER): Payer: 59 | Admitting: Gastroenterology

## 2017-01-30 ENCOUNTER — Encounter: Payer: Self-pay | Admitting: Gastroenterology

## 2017-01-30 VITALS — BP 98/62 | HR 84 | Ht 59.75 in | Wt 196.0 lb

## 2017-01-30 DIAGNOSIS — G8929 Other chronic pain: Secondary | ICD-10-CM | POA: Diagnosis not present

## 2017-01-30 DIAGNOSIS — F411 Generalized anxiety disorder: Secondary | ICD-10-CM | POA: Diagnosis not present

## 2017-01-30 DIAGNOSIS — R194 Change in bowel habit: Secondary | ICD-10-CM

## 2017-01-30 DIAGNOSIS — R1013 Epigastric pain: Secondary | ICD-10-CM

## 2017-01-30 NOTE — Progress Notes (Signed)
[facility] Consult Note:  History: Helen Greene 01/30/2017  Referring physician: Doristine Bosworth, MD  Reason for consult/chief complaint: Constipation (Pt reports it is off and on.  She reports having 4+ BMs in a week.); Abdominal Pain (epigastric "punching" pain ocassionally.  Used  to take Omeprazole daily and it helped. Now takes only prn); and Nausea (pt reports nausea in the morning at least once a week)   Subjective  HPI:  This is a 46 year old woman referred by primary care noted above for abdominal pain and altered bowel habits. [name] tells me that about 2 months ago she had an acute GI illness that sounds probably infectious. It was characterized by upper abdominal pain with protracted vomiting for several days. Ever since then she has had some intermittent epigastric pain that is nonradiating without nausea or vomiting. She has lately taken omeprazole on occasion. At the time of the symptoms she also had severe left lower quadrant pain and was seen in the ED. There was initially concern for diverticulitis, but that was not evident on CT. She did a left ovarian cyst with some apparent adjacent inflammation that was felt to be the cause of these symptoms. She tells me that repeat imaging showed that the cyst is almost gone, and that pain is improved as well. Tends to alternate between constipation and diarrhea. Bonney takes ibuprofen 800 mg daily for chronic musculoskeletal pain, and denies passage of black or bloody stool. She also feels that all of her digestive symptoms have been worse lately with some increase in stress. She also admits that she often eats fast food even though she knows that she should not.  ROS:  Review of Systems  Constitutional: Negative for appetite change and unexpected weight change.  HENT: Negative for mouth sores and voice change.   Eyes: Negative for pain and redness.  Respiratory: Negative for cough and shortness of breath.     Cardiovascular: Negative for chest pain and palpitations.  Genitourinary: Negative for dysuria and hematuria.  Musculoskeletal: Negative for arthralgias and myalgias.  Skin: Negative for pallor and rash.  Neurological: Positive for headaches. Negative for weakness.  Hematological: Negative for adenopathy.  Psychiatric/Behavioral: The patient is nervous/anxious.      Past Medical History: Past Medical History:  Diagnosis Date  . Allergy   . Anxiety   . Complication of anesthesia   . Depression   . Elevated white blood cell count   . Elevated white blood cell count 09/25/2011  . GERD (gastroesophageal reflux disease)   . Headache    Migraine  . Hypertension   . Kidney stone   . Neuromuscular disorder (HCC)    carpel tunnel disease  . PONV (postoperative nausea and vomiting)   . Restless leg syndrome   . Shortness of breath dyspnea    with exertion  . Sleep apnea    does not have CPAP machine, mostly affects back sleeping was encouraged to sleep on her sides     Past Surgical History: Past Surgical History:  Procedure Laterality Date  . CHOLECYSTECTOMY    . COLONOSCOPY    . DILATION AND CURETTAGE OF UTERUS  1993  . ECTOPIC PREGNANCY SURGERY    . ESSURE TUBAL LIGATION    . LITHOTRIPSY    . ROBOTIC ASSISTED TOTAL HYSTERECTOMY WITH SALPINGECTOMY Left 09/14/2015   Procedure: ROBOTIC ASSISTED TOTAL HYSTERECTOMY WITH LEFT SALPINGECTOMY;  Surgeon: Noland Fordyce, MD;  Location: [facility];  Service: Gynecology;  Laterality: Left;  . UPPER  GI ENDOSCOPY    . WISDOM TOOTH EXTRACTION       Family History: Family History  Problem Relation Age of Onset  . Hypertension Mother   . Heart failure Mother   . Cancer Mother   . Heart disease Mother   . Heart disease Brother   . Hypertension Brother   . Cancer Father        Leukemia  . Hypertension Father   . Cancer Sister     Social History: Social History   Social History  . Marital status: Divorced    Spouse name: N/A   . Number of children: N/A  . Years of education: N/A   Social History Main Topics  . Smoking status: Never Smoker  . Smokeless tobacco: Never Used  . Alcohol use No  . Drug use: No  . Sexual activity: Not Currently    Partners: Male     Comment: 1ST intercourse- 18, partners-  2    Other Topics Concern  . None   Social History Narrative  . None    Allergies: Allergies  Allergen Reactions  . Bee Venom Hives and Swelling  . Enablex [Darifenacin Hydrobromide] Other (See Comments)    Phlebitis.  . Erythromycin Hives  . Macrobid [Nitrofurantoin Monohyd Macro] Nausea And Vomiting  . Nitrofuran Derivatives Nausea And Vomiting    Outpatient Meds: Current Outpatient Prescriptions  Medication Sig Dispense Refill  . buPROPion (WELLBUTRIN XL) 300 MG 24 hr tablet Take 1 tablet (300 mg total) by mouth daily. 90 tablet 1  . clotrimazole-betamethasone (LOTRISONE) cream Apply 1 application topically daily as needed (rash).     . cyclobenzaprine (FLEXERIL) 5 MG tablet Take 1 tablet (5 mg total) by mouth 3 (three) times daily as needed for muscle spasms. 90 tablet 6  . escitalopram (LEXAPRO) 10 MG tablet Take 1 tablet (10 mg total) by mouth daily. 90 tablet 1  . folic acid (FOLVITE) 1 MG tablet Take 1 tablet (1 mg total) by mouth daily. 90 tablet 1  . hydrOXYzine (ATARAX/VISTARIL) 25 MG tablet Take 25 mg by mouth 3 (three) times daily as needed for anxiety or vomiting.    Marland Kitchen ibuprofen (ADVIL,MOTRIN) 800 MG tablet Take 1 tablet (800 mg total) by mouth every 8 (eight) hours as needed. 90 tablet 1  . lisinopril-hydrochlorothiazide (PRINZIDE,ZESTORETIC) 10-12.5 MG tablet Take 1 tablet by mouth daily. 90 tablet 3  . nystatin (MYCOSTATIN/NYSTOP) powder Apply topically 2 (two) times daily. 15 g 1  . pramipexole (MIRAPEX) 1.5 MG tablet Take 1 tablet (1.5 mg total) by mouth at bedtime. 90 tablet 1  . valACYclovir (VALTREX) 1000 MG tablet TAKE 2 TABLETS BY MOUTH EVERY 12 HOURS FOR ONE DAY WITH  OUTBREAK 20 tablet 0   No current facility-administered medications for this visit.       ___________________________________________________________________ Objective   Exam:  BP 98/62   Pulse 84   Ht 4' 11.75" (1.518 m)   Wt 196 lb (88.9 kg)   LMP 09/07/2015 (Exact Date)   BMI 38.60 kg/m    General: this is a(n) Well-appearing woman. She becomes visibly upset when talking about her mother's health history   Eyes: sclera anicteric, no redness  ENT: oral mucosa moist without lesions, no cervical or supraclavicular lymphadenopathy, good dentition  CV: RRR without murmur, S1/S2, no JVD, no peripheral edema  Resp: clear to auscultation bilaterally, normal RR and effort noted  GI: soft, mild epigastric tenderness, with active bowel sounds. No guarding or palpable organomegaly noted.  Skin; warm and dry, no rash or jaundice noted  Neuro: awake, alert and oriented x 3. Normal gross motor function and fluent speech  Labs:  CBC Latest Ref Rng & Units 01/14/2017 12/10/2016 12/07/2016  WBC 3.4 - 10.8 x10E3/uL 14.7(H) 14.3(A) 16.3(A)  Hemoglobin 11.1 - 15.9 g/dL 16.114.3 09.614.5 04.515.4  Hematocrit 34.0 - 46.6 % 42.8 42.1 44.7  Platelets 150 - 379 x10E3/uL 387(H) - -   CMP Latest Ref Rng & Units 01/14/2017 12/10/2016 12/07/2016  Glucose 65 - 99 mg/dL 90 90 409(W102(H)  BUN 6 - 24 mg/dL 12 12 13   Creatinine 0.57 - 1.00 mg/dL 1.190.78 1.470.77 8.290.84  Sodium 134 - 144 mmol/L 139 137 136  Potassium 3.5 - 5.2 mmol/L 4.1 4.1 3.7  Chloride 96 - 106 mmol/L 98 98 103  CO2 20 - 29 mmol/L 22 22 23   Calcium 8.7 - 10.2 mg/dL 9.7 9.4 9.4  Total Protein 6.0 - 8.5 g/dL 7.4 7.3 7.9  Total Bilirubin 0.0 - 1.2 mg/dL 0.4 0.4 1.2  Alkaline Phos 39 - 117 IU/L 98 153(H) 135(H)  AST 0 - 40 IU/L 17 30 171(H)  ALT 0 - 32 IU/L 14 73(H) 94(H)     Radiologic Studies:  CTAP left ovarian cyst with inflammation 12/07/16, otherwise normal  Assessment: Encounter Diagnoses  Name Primary?  . Abdominal pain, chronic,  epigastric Yes  . Altered bowel habits   . Generalized anxiety disorder     It is not entirely clear what flared her symptoms several months ago. It sounds as if she may have had an acute viral infection and now has some lingering postinfectious symptoms. I suspect there also may be some anxiety related functional component. She is taking NSAIDs regularly and has epigastric pain, so I felt that we should rule out peptic ulcer him and she is agreeable to an EGD.  Plan:  EGD  Thank you for the courtesy of this consult.  Please call me with any questions or concerns.  Charlie PitterHenry L Danis III  CC: Doristine BosworthStallings, Zoe A, MD

## 2017-01-30 NOTE — Patient Instructions (Signed)
If you are age 47 or older, your body mass index should be between 23-30. Your Body mass index is 38.6 kg/m. If this is out of the aforementioned range listed, please consider follow up with your Primary Care Provider.  If you are age 47 or younger, your body mass index should be between 19-25. Your Body mass index is 38.6 kg/m. If this is out of the aformentioned range listed, please consider follow up with your Primary Care Provider.   You have been scheduled for an endoscopy. Please follow written instructions given to you at your visit today. If you use inhalers (even only as needed), please bring them with you on the day of your procedure. Your physician has requested that you go to www.startemmi.com and enter the access code given to you at your visit today. This web site gives a general overview about your procedure. However, you should still follow specific instructions given to you by our office regarding your preparation for the procedure.  Thank you for choosing [facility]  Dr Amada JupiterHenry Danis III

## 2017-02-05 ENCOUNTER — Ambulatory Visit (AMBULATORY_SURGERY_CENTER): Payer: 59 | Admitting: Gastroenterology

## 2017-02-05 ENCOUNTER — Encounter: Payer: Self-pay | Admitting: Gastroenterology

## 2017-02-05 VITALS — BP 137/89 | HR 85 | Temp 97.8°F | Resp 18 | Ht 59.75 in | Wt 196.0 lb

## 2017-02-05 DIAGNOSIS — R1013 Epigastric pain: Secondary | ICD-10-CM | POA: Diagnosis not present

## 2017-02-05 MED ORDER — SODIUM CHLORIDE 0.9 % IV SOLN: 500.0000 mL | INTRAVENOUS | Status: DC

## 2017-02-05 NOTE — Patient Instructions (Signed)
YOU HAD AN ENDOSCOPIC PROCEDURE TODAY AT THE [facility]:   Refer to the procedure report that was given to you for any specific questions about what was found during the examination.  If the procedure report does not answer your questions, please call your gastroenterologist to clarify.  If you requested that your care partner not be given the details of your procedure findings, then the procedure report has been included in a sealed envelope for you to review at your convenience later.  YOU SHOULD EXPECT: Some feelings of bloating in the abdomen. Passage of more gas than usual.  Walking can help get rid of the air that was put into your GI tract during the procedure and reduce the bloating.   Please Note:  You might notice some irritation and congestion in your nose or some drainage.  This is from the oxygen used during your procedure.  There is no need for concern and it should clear up in a day or so.  SYMPTOMS TO REPORT IMMEDIATELY:  Following upper endoscopy (EGD)  Vomiting of blood or coffee ground material  New chest pain or pain under the shoulder blades  Painful or persistently difficult swallowing  New shortness of breath  Fever of 100F or higher  Black, tarry-looking stools  For urgent or emergent issues, a gastroenterologist can be reached at any hour by calling [phone].   DIET:  We do recommend a small meal at first, but then you may proceed to your regular diet.  Drink plenty of fluids but you should avoid alcoholic beverages for 24 hours.  ACTIVITY:  You should plan to take it easy for the rest of today and you should NOT DRIVE or use heavy machinery until tomorrow (because of the sedation medicines used during the test).    FOLLOW UP: Our staff will call the number listed on your records the next business day following your procedure to check on you and address any questions or concerns that you may have regarding the information given to you following  your procedure. If we do not reach you, we will leave a message.  However, if you are feeling well and you are not experiencing any problems, there is no need to return our call.  We will assume that you have returned to your regular daily activities without incident.  SIGNATURES/CONFIDENTIALITY: You and/or your care partner have signed paperwork which will be entered into your electronic medical record.  These signatures attest to the fact that that the information above on your After Visit Summary has been reviewed and is understood.  Full responsibility of the confidentiality of this discharge information lies with you and/or your care-partner.  Continue your normal medications  Please call office if you have any needs or concerns

## 2017-02-05 NOTE — Progress Notes (Signed)
A and O x3. Report to RN. Tolerated MAC anesthesia well.Teeth unchanged after procedure.

## 2017-02-05 NOTE — Progress Notes (Signed)
Pt's states no medical or surgical changes since previsit or office visit. 

## 2017-02-05 NOTE — Op Note (Signed)
[facility] Patient Name: Helen Greene Procedure Date: 02/05/2017 3:18 PM MRN: [id number] Endoscopist: Sherilyn Cooter L. Myrtie Neither , MD Age: 46 Referring MD:  Date of Birth: 1970/07/05 Gender: Female Account #: [id number] Procedure:                Upper GI endoscopy Indications:              Epigastric abdominal pain (with chronic NSAID use) Medicines:                Monitored Anesthesia Care Procedure:                Pre-Anesthesia Assessment:                           - Prior to the procedure, a History and Physical                            was performed, and patient medications and                            allergies were reviewed. The patient's tolerance of                            previous anesthesia was also reviewed. The risks                            and benefits of the procedure and the sedation                            options and risks were discussed with the patient.                            All questions were answered, and informed consent                            was obtained. Prior Anticoagulants: The patient has                            taken no previous anticoagulant or antiplatelet                            agents. ASA Grade Assessment: II - A patient with                            mild systemic disease. After reviewing the risks                            and benefits, the patient was deemed in                            satisfactory condition to undergo the procedure.                           After obtaining informed consent, the endoscope was  passed under direct vision. Throughout the                            procedure, the patient's blood pressure, pulse, and                            oxygen saturations were monitored continuously. The                            Endoscope was introduced through the mouth, and                            advanced to the second part of duodenum. The upper                            GI  endoscopy was accomplished without difficulty.                            The patient tolerated the procedure well. Scope In: Scope Out: Findings:                 The esophagus was normal.                           The stomach was normal.                           The cardia and gastric fundus were normal on                            retroflexion.                           The examined duodenum was normal. Complications:            No immediate complications. Estimated Blood Loss:     Estimated blood loss: none. Impression:               - Normal esophagus.                           - Normal stomach.                           - Normal examined duodenum.                           - No specimens collected.                           The overall clinical scenario is most consistent                            with post-infectious symptoms and perhaps an                            element of recent stressors. Recommendation:           -  Patient has a contact number available for                            emergencies. The signs and symptoms of potential                            delayed complications were discussed with the                            patient. Return to normal activities tomorrow.                            Written discharge instructions were provided to the                            patient.                           - Patient has a contact number available for                            emergencies. The signs and symptoms of potential                            delayed complications were discussed with the                            patient. Return to normal activities tomorrow.                            Written discharge instructions were provided to the                            patient.                           - Resume previous diet.                           - Continue present medications. Melodee Lupe L. Myrtie Neither, MD 02/05/2017 3:40:20 PM This report has been signed  electronically.

## 2017-02-06 ENCOUNTER — Telehealth: Payer: Self-pay | Admitting: *Deleted

## 2017-02-06 NOTE — Telephone Encounter (Signed)
  Follow up Call-  Call back number 02/05/2017  Post procedure Call Back phone  # [phone]  Permission to leave phone message Yes  Some recent data might be hidden     Patient questions:  Do you have a fever, pain , or abdominal swelling? No. Pain Score  0 *  Have you tolerated food without any problems? Yes.    Have you been able to return to your normal activities? Yes.    Do you have any questions about your discharge instructions: Diet   No. Medications  No. Follow up visit  no  Do you have questions or concerns about your Care? Yes.  Patient had a question regarding her blood pressure and felt like it was elevated because she was "really sleepy"  after she went home. She did not check this at home so I told her to monitor it and to notify her PCP if it was elevated. No other complaints. SM  Actions: * If pain score is 4 or above: No action needed, pain <4.

## 2017-02-06 NOTE — Telephone Encounter (Signed)
  Follow up Call-  Call back number 02/05/2017  Post procedure Call Back phone  # [phone]  Permission to leave phone message Yes  Some recent data might be hidden     Patient questions:  Lm on number that identifies pt that we will try back later today- EWM, RN

## 2017-03-10 ENCOUNTER — Ambulatory Visit: Payer: 59 | Admitting: Gastroenterology

## 2017-03-14 ENCOUNTER — Ambulatory Visit (INDEPENDENT_AMBULATORY_CARE_PROVIDER_SITE_OTHER): Payer: 59 | Admitting: Obstetrics & Gynecology

## 2017-03-14 ENCOUNTER — Encounter: Payer: Self-pay | Admitting: Obstetrics & Gynecology

## 2017-03-14 VITALS — BP 136/88

## 2017-03-14 DIAGNOSIS — N763 Subacute and chronic vulvitis: Secondary | ICD-10-CM | POA: Diagnosis not present

## 2017-03-14 MED ORDER — CLOBETASOL PROPIONATE 0.05 % EX OINT: 1.0000 "application " | TOPICAL_OINTMENT | Freq: Two times a day (BID) | CUTANEOUS | 1 refills | Status: AC

## 2017-03-14 NOTE — Progress Notes (Signed)
    Helen Greene 1971-04-02 [id number]        46 y.o.  [id number]   RP:  Left post vulvar irritation with small bumps  HPI:  Feeling irritated x a couple of weeks at left post vulva with mall bumps felt.  No abnormal d/c or bleeding.  Had Robotic TLH/Lt Salpingectomy 08/2015.    Past medical history,surgical history, problem list, medications, allergies, family history and social history were all reviewed and documented in the EPIC chart.  Directed ROS with pertinent positives and negatives documented in the history of present illness/assessment and plan.  Exam:  Vitals:   03/14/17 1222  BP: 136/88   General appearance:  Normal  Vulva:  Left post vulva:  Slightly inflamed, but no discrete lesion.   Assessment/Plan:  46 y.o. [id number]   1. Subacute vulvitis Clobetasol 0.05% ointment.  Usage discussed.  Recommend to avoid all vulvar irritants.  White cotton underwear recommended.  No sexual activity.  Warm soaking in bath daily if possible.  F/U in 2-3 weeks to reassess.  Counseling on above issues >50% x 15 minutes.  Genia Del MD, 12:45 PM 03/14/2017

## 2017-03-14 NOTE — Patient Instructions (Addendum)
1. Subacute vulvitis Clobetasol 0.05% ointment.  Usage discussed.  Recommend to avoid all vulvar irritants.  White cotton underwear recommended.  No sexual activity.  Warm soaking in bath daily if possible.  F/U in 2-3 weeks to reassess.  Norman Clay, it was a pleasure to see you today!

## 2017-04-08 ENCOUNTER — Encounter: Payer: Self-pay | Admitting: Obstetrics & Gynecology

## 2017-04-08 ENCOUNTER — Ambulatory Visit (INDEPENDENT_AMBULATORY_CARE_PROVIDER_SITE_OTHER): Payer: 59 | Admitting: Obstetrics & Gynecology

## 2017-04-08 VITALS — BP 134/88

## 2017-04-08 DIAGNOSIS — N898 Other specified noninflammatory disorders of vagina: Secondary | ICD-10-CM

## 2017-04-08 DIAGNOSIS — N763 Subacute and chronic vulvitis: Secondary | ICD-10-CM

## 2017-04-08 LAB — WET PREP FOR TRICH, YEAST, CLUE

## 2017-04-08 MED ORDER — FLUCONAZOLE 150 MG PO TABS: 150.0000 mg | ORAL_TABLET | Freq: Once | ORAL | 2 refills | Status: AC

## 2017-04-08 MED ORDER — METRONIDAZOLE 0.75 % VA GEL: 1.0000 | Freq: Every day | VAGINAL | 0 refills | Status: AC

## 2017-04-08 MED ORDER — CLOBETASOL PROPIONATE 0.05 % EX CREA: 1.0000 "application " | TOPICAL_CREAM | Freq: Two times a day (BID) | CUTANEOUS | 0 refills | Status: DC

## 2017-04-08 NOTE — Progress Notes (Signed)
    Helen Greene 1970/09/26 [id number]        46 y.o.  [id number] Divorced.  Boyfriend.  RP:  Continued irritation to left lower vulva and increased vaginal d/c  HPI:  Continued irritation at lower left vulva x last visit about 4 weeks ago.   Feeling discomfort at left post vulva with small bumps felt.  Now has an increased vaginal d/c, but no bleeding.  Had Robotic TLH/Lt Salpingectomy 08/2015.   Past medical history,surgical history, problem list, medications, allergies, family history and social history were all reviewed and documented in the EPIC chart.  Directed ROS with pertinent positives and negatives documented in the history of present illness/assessment and plan.  Exam:  Vitals:   04/08/17 1552  BP: 134/88   General appearance:  Normal  Gyn exam:  Vulva:  No erythema.  Very small "bumps" of the vulvar skin in the left lower area, which appear wnl.  Speculum:  Increased d/c, wet prep done.   Assessment/Plan: 46 y.o. [id number]   1. Chronic vulvitis Clobetasol 0.05% cream twice a day until f/u.  Will proceed with Vulvar Bx at f/u given the persistence of Sxs. - WET PREP FOR TRICH, YEAST, CLUE  2. Vaginal discharge Bacterial Vaginosis.  Dx and treatment discussed.  Risk of Yeast vaginitis post ABTx.  Metrogel x 5 HS.  Then  Fluconazole tab x 1. - WET PREP FOR TRICH, YEAST, CLUE  Counseling on above issues >50% x 15 minutes  Genia DelMarie-Lyne Dmarcus Decicco MD, 4:37 PM 04/08/2017

## 2017-04-09 ENCOUNTER — Other Ambulatory Visit: Payer: Self-pay | Admitting: Family Medicine

## 2017-04-12 NOTE — Patient Instructions (Signed)
1. Chronic vulvitis Clobetasol 0.05% cream twice a day until f/u.  Will proceed with Vulvar Bx at f/u given the persistence of Sxs. - WET PREP FOR TRICH, YEAST, CLUE  2. Vaginal discharge Bacterial Vaginosis.  Dx and treatment discussed.  Risk of Yeast vaginitis post ABTx.  Metrogel x 5 HS.  Then  Fluconazole tab x 1. - WET PREP FOR TRICH, YEAST, CLUE  Good to see you today Sofie RowerLiesa!

## 2017-04-25 ENCOUNTER — Ambulatory Visit (INDEPENDENT_AMBULATORY_CARE_PROVIDER_SITE_OTHER): Payer: 59 | Admitting: Obstetrics & Gynecology

## 2017-04-25 ENCOUNTER — Encounter: Payer: Self-pay | Admitting: Obstetrics & Gynecology

## 2017-04-25 VITALS — BP 140/86

## 2017-04-25 DIAGNOSIS — N763 Subacute and chronic vulvitis: Secondary | ICD-10-CM | POA: Diagnosis not present

## 2017-04-25 NOTE — Progress Notes (Signed)
    Helen Greene 02-15-1971 [id number]        46 y.o.  [id number] Divorced.  Boyfriend.  RP:  Left posterior vulvar irritation for Vulvar biopsy  HPI: No change since last visit:  Continued irritation at lower left vulva x last visit, but slightly better given no IC.  Feeling discomfort at left post vulva with small bumps felt. Normal vaginal d/c.  No bleeding. Had Robotic TLH/Lt Salpingectomy 08/2015.   Past medical history,surgical history, problem list, medications, allergies, family history and social history were all reviewed and documented in the EPIC chart.  Directed ROS with pertinent positives and negatives documented in the history of present illness/assessment and plan.  Exam:  Vitals:   04/25/17 1608  BP: 140/86   General appearance:  Normal  Gyn exam:  Left inferior vulva with prominent glandular pattern.  No erythema.  Excisional oval biopsy with scalpel after Betadine prep and local anesthesia with Lidocaine 1%.  Closure with 3 separate stitches of Vicryl 4-0.  Good hemostasis.   Assessment/Plan:  46 y.o. [id number]   1. Chronic vulvitis Given persistence of discomfort, decision to proceed with Excisional Bx.  Specimen sent to pathology.  No complication, well tolerated and good hemostasis.  Post Vulvar Bx precautions discussed.  Helen DelMarie-Lyne Chanley Mcenery MD, 4:54 PM 04/25/2017

## 2017-04-28 ENCOUNTER — Encounter: Payer: Self-pay | Admitting: Obstetrics & Gynecology

## 2017-04-28 NOTE — Patient Instructions (Signed)
1. Chronic vulvitis Given persistence of discomfort, decision to proceed with Excisional Bx.  Specimen sent to pathology.  No complication, well tolerated and good hemostasis.  Post Vulvar Bx precautions discussed.  Helen RowerLiesa, good seeing you today!  I will inform you of the pathology results as soon as available.

## 2017-04-30 LAB — TISSUE SPECIMEN

## 2017-04-30 LAB — PATHOLOGY

## 2017-07-27 ENCOUNTER — Other Ambulatory Visit: Payer: Self-pay | Admitting: Family Medicine

## 2017-07-27 DIAGNOSIS — I10 Essential (primary) hypertension: Secondary | ICD-10-CM

## 2017-07-28 NOTE — Telephone Encounter (Signed)
Please advise. Dgaddy, CMA 

## 2017-07-30 ENCOUNTER — Encounter (HOSPITAL_COMMUNITY): Payer: Self-pay | Admitting: *Deleted

## 2017-07-30 ENCOUNTER — Emergency Department (HOSPITAL_COMMUNITY): Payer: 59

## 2017-07-30 ENCOUNTER — Emergency Department (HOSPITAL_COMMUNITY)
Admission: EM | Admit: 2017-07-30 | Discharge: 2017-07-30 | Disposition: A | Discharge status: Home / Self Care | Payer: 59 | Attending: Emergency Medicine | Admitting: Emergency Medicine

## 2017-07-30 DIAGNOSIS — Z79899 Other long term (current) drug therapy: Secondary | ICD-10-CM | POA: Diagnosis not present

## 2017-07-30 DIAGNOSIS — M5412 Radiculopathy, cervical region: Secondary | ICD-10-CM | POA: Diagnosis not present

## 2017-07-30 DIAGNOSIS — M792 Neuralgia and neuritis, unspecified: Secondary | ICD-10-CM

## 2017-07-30 DIAGNOSIS — I10 Essential (primary) hypertension: Secondary | ICD-10-CM | POA: Insufficient documentation

## 2017-07-30 DIAGNOSIS — G629 Polyneuropathy, unspecified: Secondary | ICD-10-CM | POA: Diagnosis not present

## 2017-07-30 DIAGNOSIS — R531 Weakness: Secondary | ICD-10-CM | POA: Diagnosis present

## 2017-07-30 LAB — CBC
HCT: 44.4 % (ref 36.0–46.0)
Hemoglobin: 14.3 g/dL (ref 12.0–15.0)
MCH: 30.5 pg (ref 26.0–34.0)
MCHC: 32.2 g/dL (ref 30.0–36.0)
MCV: 94.7 fL (ref 78.0–100.0)
PLATELETS: 385 10*3/uL (ref 150–400)
RBC: 4.69 MIL/uL (ref 3.87–5.11)
RDW: 13.2 % (ref 11.5–15.5)
WBC: 12 10*3/uL — ABNORMAL HIGH (ref 4.0–10.5)

## 2017-07-30 LAB — BASIC METABOLIC PANEL
Anion gap: 12 (ref 5–15)
BUN: 13 mg/dL (ref 6–20)
CALCIUM: 9.1 mg/dL (ref 8.9–10.3)
CHLORIDE: 105 mmol/L (ref 101–111)
CO2: 24 mmol/L (ref 22–32)
CREATININE: 0.88 mg/dL (ref 0.44–1.00)
GFR calc Af Amer: >60 mL/min (ref >60)
GFR calc non Af Amer: >60 mL/min (ref >60)
GLUCOSE: 112 mg/dL — AB (ref 65–99)
Potassium: 4.2 mmol/L (ref 3.5–5.1)
Sodium: 141 mmol/L (ref 135–145)

## 2017-07-30 LAB — I-STAT BETA HCG BLOOD, ED (MC, WL, AP ONLY): I-stat hCG, quantitative: <5 m[IU]/mL (ref <5)

## 2017-07-30 LAB — I-STAT TROPONIN, ED: TROPONIN I, POC: 0 ng/mL (ref 0.00–0.08)

## 2017-07-30 MED ORDER — PREDNISONE 20 MG PO TABS: ORAL_TABLET | ORAL | 0 refills | Status: DC

## 2017-07-30 MED ORDER — HYDROCODONE-ACETAMINOPHEN 5-325 MG PO TABS: 1.0000 | ORAL_TABLET | ORAL | 0 refills | Status: DC | PRN

## 2017-07-30 NOTE — ED Triage Notes (Signed)
To ED for eval of left arm tingling associated with cp and sob. States this 'has been happening a lot recently'. Arm tinging and tightness now.

## 2017-07-30 NOTE — ED Notes (Signed)
Patient ambulated to restroom independently. No acute distress noted.  

## 2017-07-30 NOTE — Discharge Instructions (Signed)
1.  Follow-up with your doctor.  Based on your response to treatment, your doctor might choose to send you to a spine specialist or an orthopedic doctor.

## 2017-07-30 NOTE — ED Provider Notes (Signed)
[facility] EMERGENCY DEPARTMENT Provider Note   CSN: [id number] Arrival date & time: 07/30/17  0802     History   Chief Complaint Chief Complaint  Patient presents with  . Weakness  . Chest Pain  . Shortness of Breath    HPI Helen Greene is a 47 y.o. female.  HPI Patient has been getting intermittent problems with pain and tingling radiating down the inside of her left upper arm for greater than several months.  Initially she thought it was a nerve.  It would come and go.  It was not particularly frequent.  Feel like there is an uncomfortable sensation in her medial upper arm and then would feel pain radiate towards her hand and some numbness.  She reports sometimes a few the fingers might feel numb and other times all of them.  She reports that this has been getting more frequent now.  Episodes are lasting longer.  Today she reports that pain was radiating up towards the back of her neck and top of her shoulder, she finally became concerned enough to seek treatment.  Ports she has a very strong family history for cardiac disease even though she does not really think that is what the problem is. Past Medical History:  Diagnosis Date  . Allergy   . Anxiety   . Complication of anesthesia   . Depression   . Elevated white blood cell count   . Elevated white blood cell count 09/25/2011  . GERD (gastroesophageal reflux disease)   . Headache    Migraine  . Hypertension   . Kidney stone   . Neuromuscular disorder (HCC)    carpel tunnel disease  . PONV (postoperative nausea and vomiting)   . Restless leg syndrome   . Shortness of breath dyspnea    with exertion  . Sleep apnea    does not have CPAP machine, mostly affects back sleeping was encouraged to sleep on her sides    Patient Active Problem List   Diagnosis Date Noted  . S/P total hysterectomy 09/14/2015  . Frequent headaches 03/17/2015  . RLS (restless legs syndrome) 03/17/2015  . HTN  (hypertension) 05/19/2013  . Elevated white blood cell count 09/25/2011    Past Surgical History:  Procedure Laterality Date  . CHOLECYSTECTOMY    . COLONOSCOPY    . DILATION AND CURETTAGE OF UTERUS  1993  . ECTOPIC PREGNANCY SURGERY    . ESSURE TUBAL LIGATION    . LITHOTRIPSY    . ROBOTIC ASSISTED TOTAL HYSTERECTOMY WITH SALPINGECTOMY Left 09/14/2015   Procedure: ROBOTIC ASSISTED TOTAL HYSTERECTOMY WITH LEFT SALPINGECTOMY;  Surgeon: Noland Fordyce, MD;  Location: [facility];  Service: Gynecology;  Laterality: Left;  . UPPER GI ENDOSCOPY    . WISDOM TOOTH EXTRACTION      OB History    Gravida Para Term Preterm AB Living   3 2     1 2    SAB TAB Ectopic Multiple Live Births       1           Home Medications    Prior to Admission medications   Medication Sig Start Date End Date Taking? Authorizing Provider  buPROPion (WELLBUTRIN XL) 300 MG 24 hr tablet Take 1 tablet (300 mg total) by mouth daily. 01/14/17   Doristine Bosworth, MD  clobetasol cream (TEMOVATE) 0.05 % Apply 1 application topically 2 (two) times daily. Thin application to left vulva. 04/08/17   Genia Del, MD  clotrimazole-betamethasone Thurmond Butts)  cream Apply 1 application topically daily as needed (rash).  06/28/16   [provider]  cyclobenzaprine (FLEXERIL) 5 MG tablet Take 1 tablet (5 mg total) by mouth 3 (three) times daily as needed for muscle spasms. 01/14/17   Doristine BosworthStallings, Zoe A, MD  escitalopram (LEXAPRO) 10 MG tablet Take 1 tablet (10 mg total) by mouth daily. 01/14/17   Doristine BosworthStallings, Zoe A, MD  folic acid (FOLVITE) 1 MG tablet Take 1 tablet (1 mg total) by mouth daily. 01/14/17   Doristine BosworthStallings, Zoe A, MD  HYDROcodone-acetaminophen (NORCO/VICODIN) 5-325 MG tablet Take 1-2 tablets by mouth every 4 (four) hours as needed for moderate pain or severe pain. 07/30/17   Arby BarrettePfeiffer, Riya Huxford, MD  hydrOXYzine (ATARAX/VISTARIL) 25 MG tablet Take 25 mg by mouth 3 (three) times daily as needed for anxiety or vomiting.     [provider]  ibuprofen (ADVIL,MOTRIN) 800 MG tablet Take 1 tablet (800 mg total) by mouth every 8 (eight) hours as needed. 01/14/17   Doristine BosworthStallings, Zoe A, MD  lisinopril-hydrochlorothiazide (PRINZIDE,ZESTORETIC) 10-12.5 MG tablet Take 1 tablet by mouth daily. 06/28/16   Doristine BosworthStallings, Zoe A, MD  mirabegron ER (MYRBETRIQ) 50 MG TB24 tablet Take 50 mg by mouth daily.    [provider]  nystatin (MYCOSTATIN/NYSTOP) powder Apply topically 2 (two) times daily. 06/28/16   Doristine BosworthStallings, Zoe A, MD  pramipexole (MIRAPEX) 1.5 MG tablet Take 1 tablet (1.5 mg total) by mouth at bedtime. 01/14/17   Doristine BosworthStallings, Zoe A, MD  predniSONE (DELTASONE) 20 MG tablet 3 tabs po daily x 3 days, then 2 tabs x 3 days, then 1.5 tabs x 3 days, then 1 tab x 3 days, then 0.5 tabs x 3 days 07/30/17   Arby BarrettePfeiffer, Maureen Duesing, MD  valACYclovir (VALTREX) 1000 MG tablet TAKE 2 TABLETS BY MOUTH EVERY 12 HOURS FOR ONE DAY WITH OUTBREAK 04/10/17   Doristine BosworthStallings, Zoe A, MD    Family History Family History  Problem Relation Age of Onset  . Hypertension Mother   . Heart failure Mother   . Cancer Mother   . Heart disease Mother   . Heart disease Brother   . Hypertension Brother   . Cancer Father        Leukemia  . Hypertension Father   . Cancer Sister   . Colon cancer Neg Hx   . Esophageal cancer Neg Hx   . Rectal cancer Neg Hx   . Stomach cancer Neg Hx     Social History Social History   Tobacco Use  . Smoking status: Never Smoker  . Smokeless tobacco: Never Used  Substance Use Topics  . Alcohol use: No    Alcohol/week: 0.0 oz  . Drug use: No     Allergies   Bee venom; Enablex [darifenacin hydrobromide]; Erythromycin; Macrobid [nitrofurantoin monohyd macro]; and Nitrofuran derivatives   Review of Systems Review of Systems 10 Systems reviewed and are negative for acute change except as noted in the HPI.   Physical Exam Updated Vital Signs BP 135/81   Pulse 84   Resp 19   LMP 09/07/2015 (Exact Date)   SpO2  99%   Physical Exam  Constitutional: She is oriented to person, place, and time. She appears well-developed and well-nourished. No distress.  HENT:  Head: Normocephalic and atraumatic.  Mouth/Throat: Oropharynx is clear and moist.  Eyes: Conjunctivae and EOM are normal.  Neck: Neck supple.  Cardiovascular: Normal rate, regular rhythm, normal heart sounds and intact distal pulses.  No murmur heard. Pulmonary/Chest: Effort normal and  breath sounds normal. No respiratory distress.  Abdominal: Soft. There is no tenderness.  Musculoskeletal: She exhibits tenderness. She exhibits no edema or deformity.  Reproducible tenderness in the paraspinous cervical muscle bodies on the left.  Also tender along the trapezius.  reproducible discomfort at the medial upper forearm, no soft tissue anomaly.  No masses no erythema.  No edema of the arm.  Normal neurovascular exam.  Patient strength is intact for shoulder upper arm and lower arm strength testing.  No peripheral edema calves are soft and nontender.  Neurological: She is alert and oriented to person, place, and time. No cranial nerve deficit or sensory deficit. She exhibits normal muscle tone. Coordination normal.  Skin: Skin is warm and dry.  Psychiatric: She has a normal mood and affect.  Nursing note and vitals reviewed.    ED Treatments / Results  Labs (all labs ordered are listed, but only abnormal results are displayed) Labs Reviewed  BASIC METABOLIC PANEL - Abnormal; Notable for the following components:      Result Value   Glucose, Bld 112 (*)    All other components within normal limits  CBC - Abnormal; Notable for the following components:   WBC 12.0 (*)    All other components within normal limits  I-STAT TROPONIN, ED  I-STAT BETA HCG BLOOD, ED ([facility], [facility], [facility] ONLY)    EKG  EKG Interpretation  Date/Time:  Wednesday July 30 2017 08:16:14 EST Ventricular Rate:  91 PR Interval:  126 QRS Duration: 88 QT Interval:  362 QTC  Calculation: 445 R Axis:   67 Text Interpretation:  Normal sinus rhythm Normal ECG agree. no change from old Confirmed by Arby Barrette [phone]) on 07/30/2017 10:15:11 AM       Radiology Dg Chest 2 View  Result Date: 07/30/2017 CLINICAL DATA:  Several week history of chest pain and tightness and shortness of breath. Also left arm pain and numbness. History of hypertension, nonsmoker. EXAM: CHEST  2 VIEW COMPARISON:  CT scan of the chest of April 21, 2012 FINDINGS: The lungs are well-expanded and clear. The heart and pulmonary vascularity are normal. The mediastinum is normal in width. There is no pleural effusion. The trachea is midline. The bony thorax is unremarkable. IMPRESSION: There is no active cardiopulmonary disease. Electronically Signed   By: David  [place] M.D.   On: 07/30/2017 08:37    Procedures Procedures (including critical care time)  Medications Ordered in ED Medications - No data to display   Initial Impression / Assessment and Plan / ED Course  I have reviewed the triage vital signs and the nursing notes.  Pertinent labs & imaging results that were available during my care of the patient were reviewed by me and considered in my medical decision making (see chart for details).      Final Clinical Impressions(s) / ED Diagnoses   Final diagnoses:  Cervical radiculopathy  Neuropathic pain   Patient presents what appears to be neuropathic type pain.  This has been incrementally developing.  No neurologic dysfunction.  Differential diagnosis is cervical radiculopathy versus compression closer to the brachial plexus.  Patient appearance is discomfort in her medial upper arm.  There are no signs of swelling or vascular anomaly.  No edema of the arm.  Underarm is soft with normal palpation of the axilla.  At this time, plan will be for follow-up with her PCP for evaluation of response to treatment and then determination of specialty referral if needed. ED Discharge  Orders  Ordered    predniSONE (DELTASONE) 20 MG tablet     07/30/17 1212    HYDROcodone-acetaminophen (NORCO/VICODIN) 5-325 MG tablet  Every 4 hours PRN     07/30/17 1212       Arby Barrette, MD 07/30/17 1221

## 2017-08-03 NOTE — H&P (Signed)
Helen Greene is an 47 y.o. female.   Chief Complaint: tonsil stones HPI: Return visit. Many year history of tonsil stones. Sometimes they cause severe irritation of her throat. She tries to clean it out with toothpicks with some success. She is considering having her tonsils removed. She drinks 32 ounces of iced tea daily. She does not have any chronic heartburn.  Past Medical History:  Diagnosis Date  . Allergy   . Anxiety   . Complication of anesthesia   . Depression   . Elevated white blood cell count   . Elevated white blood cell count 09/25/2011  . GERD (gastroesophageal reflux disease)   . Headache    Migraine  . Hypertension   . Kidney stone   . Neuromuscular disorder (HCC)    carpel tunnel disease  . PONV (postoperative nausea and vomiting)   . Restless leg syndrome   . Shortness of breath dyspnea    with exertion  . Sleep apnea    does not have CPAP machine, mostly affects back sleeping was encouraged to sleep on her sides    Past Surgical History:  Procedure Laterality Date  . CHOLECYSTECTOMY    . COLONOSCOPY    . DILATION AND CURETTAGE OF UTERUS  1993  . ECTOPIC PREGNANCY SURGERY    . ESSURE TUBAL LIGATION    . LITHOTRIPSY    . ROBOTIC ASSISTED TOTAL HYSTERECTOMY WITH SALPINGECTOMY Left 09/14/2015   Procedure: ROBOTIC ASSISTED TOTAL HYSTERECTOMY WITH LEFT SALPINGECTOMY;  Surgeon: Noland Fordyce, MD;  Location: [facility];  Service: Gynecology;  Laterality: Left;  . UPPER GI ENDOSCOPY    . WISDOM TOOTH EXTRACTION      Family History  Problem Relation Age of Onset  . Hypertension Mother   . Heart failure Mother   . Cancer Mother   . Heart disease Mother   . Heart disease Brother   . Hypertension Brother   . Cancer Father        Leukemia  . Hypertension Father   . Cancer Sister   . Colon cancer Neg Hx   . Esophageal cancer Neg Hx   . Rectal cancer Neg Hx   . Stomach cancer Neg Hx    Social History:  reports that  has never smoked. she has never used  smokeless tobacco. She reports that she does not drink alcohol or use drugs.  Allergies:  Allergies  Allergen Reactions  . Bee Venom Hives and Swelling  . Enablex [Darifenacin Hydrobromide] Other (See Comments)    Phlebitis.  . Erythromycin Hives  . Macrobid [Nitrofurantoin Monohyd Macro] Nausea And Vomiting  . Nitrofuran Derivatives Nausea And Vomiting    No medications prior to admission.    No results found for this or any previous visit (from the past 48 hour(s)). No results found.  ROS: otherwise negative  Last menstrual period 09/07/2015.  PHYSICAL EXAM: Overall appearance:  Healthy appearing, in no distress Head:  Normocephalic, atraumatic. Ears: External auditory canals are clear; tympanic membranes are intact and the middle ears are free of any effusion. Nose: External nose is healthy in appearance. Internal nasal exam free of any lesions or obstruction. Oral Cavity/pharynx:  There are no mucosal lesions or masses identified. tonsils are relatively small but with cryptic spaces. There is no debris present today. They are symmetric. Oropharynx is otherwise clear. Neuro:  No identifiable neurologic deficits. Neck: No palpable neck masses.  Studies Reviewed: none    Assessment/Plan  Chronic tonsil stones. Consider tonsillectomy. Recommend she try  more aggressive conservative measures first such as a WaterPik or Q-tips. Recommend against using toothpicks as they can be sharp and she can inhale her pharynx.Sofie RowerLiesa meets the indications for tonsillectomy. Risks and benefits were discussed in detail. All questions were answered. A handout was provided with additional details.       Serena ColonelJefry Bayani Renteria 08/03/2017, 6:58 PM

## 2017-08-05 ENCOUNTER — Encounter (HOSPITAL_BASED_OUTPATIENT_CLINIC_OR_DEPARTMENT_OTHER): Payer: Self-pay | Admitting: *Deleted

## 2017-08-05 ENCOUNTER — Other Ambulatory Visit: Payer: Self-pay

## 2017-08-11 ENCOUNTER — Ambulatory Visit (HOSPITAL_BASED_OUTPATIENT_CLINIC_OR_DEPARTMENT_OTHER)
Admission: RE | Admit: 2017-08-11 | Discharge: 2017-08-11 | Disposition: A | Discharge status: Home / Self Care | Payer: 59 | Source: Ambulatory Visit | Attending: Otolaryngology | Admitting: Otolaryngology

## 2017-08-11 ENCOUNTER — Encounter (HOSPITAL_BASED_OUTPATIENT_CLINIC_OR_DEPARTMENT_OTHER)
Admission: RE | Disposition: A | Discharge status: Home / Self Care | Payer: Self-pay | Source: Ambulatory Visit | Attending: Otolaryngology

## 2017-08-11 ENCOUNTER — Encounter (HOSPITAL_BASED_OUTPATIENT_CLINIC_OR_DEPARTMENT_OTHER): Payer: Self-pay | Admitting: Anesthesiology

## 2017-08-11 ENCOUNTER — Ambulatory Visit (HOSPITAL_BASED_OUTPATIENT_CLINIC_OR_DEPARTMENT_OTHER): Payer: 59 | Admitting: Anesthesiology

## 2017-08-11 DIAGNOSIS — G4730 Sleep apnea, unspecified: Secondary | ICD-10-CM | POA: Diagnosis not present

## 2017-08-11 DIAGNOSIS — G2581 Restless legs syndrome: Secondary | ICD-10-CM | POA: Diagnosis not present

## 2017-08-11 DIAGNOSIS — I10 Essential (primary) hypertension: Secondary | ICD-10-CM | POA: Diagnosis not present

## 2017-08-11 DIAGNOSIS — Z79899 Other long term (current) drug therapy: Secondary | ICD-10-CM | POA: Diagnosis not present

## 2017-08-11 DIAGNOSIS — K219 Gastro-esophageal reflux disease without esophagitis: Secondary | ICD-10-CM | POA: Insufficient documentation

## 2017-08-11 DIAGNOSIS — Z9089 Acquired absence of other organs: Secondary | ICD-10-CM

## 2017-08-11 DIAGNOSIS — F329 Major depressive disorder, single episode, unspecified: Secondary | ICD-10-CM | POA: Diagnosis not present

## 2017-08-11 DIAGNOSIS — J358 Other chronic diseases of tonsils and adenoids: Secondary | ICD-10-CM | POA: Diagnosis present

## 2017-08-11 DIAGNOSIS — Z87442 Personal history of urinary calculi: Secondary | ICD-10-CM | POA: Diagnosis not present

## 2017-08-11 DIAGNOSIS — F419 Anxiety disorder, unspecified: Secondary | ICD-10-CM | POA: Insufficient documentation

## 2017-08-11 HISTORY — DX: Other chronic diseases of tonsils and adenoids: J35.8

## 2017-08-11 HISTORY — PX: TONSILLECTOMY: SHX5217

## 2017-08-11 SURGERY — TONSILLECTOMY
Anesthesia: General

## 2017-08-11 MED ORDER — LACTATED RINGERS IV SOLN
INTRAVENOUS | Status: DC
  Administered 2017-08-11: 08:00:00 via INTRAVENOUS

## 2017-08-11 MED ORDER — MIDAZOLAM HCL 2 MG/2ML IJ SOLN
INTRAMUSCULAR | Status: AC
  Filled 2017-08-11: qty 2

## 2017-08-11 MED ORDER — KETOROLAC TROMETHAMINE 30 MG/ML IJ SOLN
30.0000 mg | Freq: Once | INTRAMUSCULAR | Status: DC | PRN
  Administered 2017-08-11: 30 mg via INTRAVENOUS

## 2017-08-11 MED ORDER — FENTANYL CITRATE (PF) 100 MCG/2ML IJ SOLN
50.0000 ug | INTRAMUSCULAR | Status: DC | PRN
  Administered 2017-08-11: 100 ug via INTRAVENOUS

## 2017-08-11 MED ORDER — SUCCINYLCHOLINE CHLORIDE 200 MG/10ML IV SOSY
PREFILLED_SYRINGE | INTRAVENOUS | Status: AC
  Filled 2017-08-11: qty 10

## 2017-08-11 MED ORDER — MIDAZOLAM HCL 2 MG/2ML IJ SOLN
1.0000 mg | INTRAMUSCULAR | Status: DC | PRN
  Administered 2017-08-11: 2 mg via INTRAVENOUS

## 2017-08-11 MED ORDER — FENTANYL CITRATE (PF) 100 MCG/2ML IJ SOLN
50.0000 ug | INTRAMUSCULAR | Status: DC | PRN
  Administered 2017-08-11 (×2): 50 ug via INTRAVENOUS

## 2017-08-11 MED ORDER — OXYCODONE HCL 5 MG/5ML PO SOLN: 5.0000 mg | Freq: Once | ORAL | Status: DC | PRN

## 2017-08-11 MED ORDER — ONDANSETRON HCL 4 MG/2ML IJ SOLN: 4.0000 mg | Freq: Once | INTRAMUSCULAR | Status: DC | PRN

## 2017-08-11 MED ORDER — PROMETHAZINE HCL 25 MG RE SUPP: 25.0000 mg | Freq: Four times a day (QID) | RECTAL | 1 refills | Status: DC | PRN

## 2017-08-11 MED ORDER — ACETAMINOPHEN 160 MG/5ML PO SOLN: 325.0000 mg | ORAL | Status: DC | PRN

## 2017-08-11 MED ORDER — FENTANYL CITRATE (PF) 100 MCG/2ML IJ SOLN: 25.0000 ug | INTRAMUSCULAR | Status: DC | PRN

## 2017-08-11 MED ORDER — SCOPOLAMINE 1 MG/3DAYS TD PT72: 1.0000 | MEDICATED_PATCH | Freq: Once | TRANSDERMAL | Status: DC | PRN

## 2017-08-11 MED ORDER — FENTANYL CITRATE (PF) 100 MCG/2ML IJ SOLN
INTRAMUSCULAR | Status: AC
Start: 2017-08-11 — End: ?
  Filled 2017-08-11: qty 2

## 2017-08-11 MED ORDER — HYDROCODONE-ACETAMINOPHEN 7.5-325 MG/15ML PO SOLN: 15.0000 mL | ORAL | Status: DC | PRN

## 2017-08-11 MED ORDER — MIDAZOLAM HCL 2 MG/2ML IJ SOLN: 1.0000 mg | INTRAMUSCULAR | Status: DC | PRN

## 2017-08-11 MED ORDER — KETOROLAC TROMETHAMINE 30 MG/ML IJ SOLN
INTRAMUSCULAR | Status: AC
  Filled 2017-08-11: qty 1

## 2017-08-11 MED ORDER — DEXAMETHASONE SODIUM PHOSPHATE 10 MG/ML IJ SOLN
INTRAMUSCULAR | Status: AC
  Filled 2017-08-11: qty 1

## 2017-08-11 MED ORDER — PROPOFOL 500 MG/50ML IV EMUL
INTRAVENOUS | Status: DC | PRN
  Administered 2017-08-11: 100 ug/kg/min via INTRAVENOUS

## 2017-08-11 MED ORDER — ONDANSETRON HCL 4 MG/2ML IJ SOLN
INTRAMUSCULAR | Status: DC | PRN
  Administered 2017-08-11: 4 mg via INTRAVENOUS

## 2017-08-11 MED ORDER — ACETAMINOPHEN 325 MG PO TABS: 325.0000 mg | ORAL_TABLET | ORAL | Status: DC | PRN

## 2017-08-11 MED ORDER — ONDANSETRON HCL 4 MG/2ML IJ SOLN
INTRAMUSCULAR | Status: AC
  Filled 2017-08-11: qty 2

## 2017-08-11 MED ORDER — LIDOCAINE 2% (20 MG/ML) 5 ML SYRINGE
INTRAMUSCULAR | Status: AC
  Filled 2017-08-11: qty 5

## 2017-08-11 MED ORDER — MEPERIDINE HCL 25 MG/ML IJ SOLN: 6.2500 mg | INTRAMUSCULAR | Status: DC | PRN

## 2017-08-11 MED ORDER — PHENOL 1.4 % MT LIQD: 1.0000 | OROMUCOSAL | Status: DC | PRN

## 2017-08-11 MED ORDER — DEXAMETHASONE SODIUM PHOSPHATE 4 MG/ML IJ SOLN
INTRAMUSCULAR | Status: DC | PRN
  Administered 2017-08-11: 10 mg via INTRAVENOUS

## 2017-08-11 MED ORDER — HYDROCODONE-ACETAMINOPHEN 7.5-325 MG/15ML PO SOLN: 15.0000 mL | Freq: Four times a day (QID) | ORAL | 0 refills | Status: DC | PRN

## 2017-08-11 MED ORDER — SUCCINYLCHOLINE CHLORIDE 20 MG/ML IJ SOLN
INTRAMUSCULAR | Status: DC | PRN
  Administered 2017-08-11: 140 mg via INTRAVENOUS

## 2017-08-11 MED ORDER — ALPRAZOLAM 0.25 MG PO TABS
0.2500 mg | ORAL_TABLET | Freq: Three times a day (TID) | ORAL | Status: AC | PRN
  Administered 2017-08-11: 0.25 mg via ORAL
  Filled 2017-08-11: qty 1

## 2017-08-11 MED ORDER — PROPOFOL 500 MG/50ML IV EMUL
INTRAVENOUS | Status: AC
  Filled 2017-08-11: qty 50

## 2017-08-11 MED ORDER — OXYCODONE HCL 5 MG PO TABS: 5.0000 mg | ORAL_TABLET | Freq: Once | ORAL | Status: DC | PRN

## 2017-08-11 MED ORDER — DEXTROSE-NACL 5-0.9 % IV SOLN
INTRAVENOUS | Status: DC
  Administered 2017-08-11: 10:00:00 via INTRAVENOUS

## 2017-08-11 MED ORDER — FENTANYL CITRATE (PF) 100 MCG/2ML IJ SOLN
INTRAMUSCULAR | Status: AC
  Filled 2017-08-11: qty 2

## 2017-08-11 SURGICAL SUPPLY — 30 items
CANISTER SUCT 1200ML W/VALVE (MISCELLANEOUS) ×3 IMPLANT
CATH ROBINSON RED A/P 12FR (CATHETERS) ×3 IMPLANT
COAGULATOR SUCT 6 FR SWTCH (ELECTROSURGICAL) ×1
COAGULATOR SUCT SWTCH 10FR 6 (ELECTROSURGICAL) ×2 IMPLANT
COVER BACK TABLE 60X90IN (DRAPES) ×3 IMPLANT
COVER MAYO STAND STRL (DRAPES) ×3 IMPLANT
ELECT COATED BLADE 2.86 ST (ELECTRODE) ×3 IMPLANT
ELECT REM PT RETURN 9FT ADLT (ELECTROSURGICAL) ×3
ELECT REM PT RETURN 9FT PED (ELECTROSURGICAL)
ELECTRODE REM PT RETRN 9FT PED (ELECTROSURGICAL) IMPLANT
ELECTRODE REM PT RTRN 9FT ADLT (ELECTROSURGICAL) IMPLANT
GAUZE SPONGE 4X4 12PLY STRL LF (GAUZE/BANDAGES/DRESSINGS) ×3 IMPLANT
GLOVE BIO SURGEON STRL SZ 6.5 (GLOVE) ×2 IMPLANT
GLOVE BIO SURGEONS STRL SZ 6.5 (GLOVE) ×2
GLOVE ECLIPSE 7.5 STRL STRAW (GLOVE) ×3 IMPLANT
GOWN STRL REUS W/ TWL LRG LVL3 (GOWN DISPOSABLE) ×2 IMPLANT
GOWN STRL REUS W/TWL LRG LVL3 (GOWN DISPOSABLE) ×6
MARKER SKIN DUAL TIP RULER LAB (MISCELLANEOUS) IMPLANT
NS IRRIG 1000ML POUR BTL (IV SOLUTION) ×3 IMPLANT
PENCIL FOOT CONTROL (ELECTRODE) ×3 IMPLANT
SHEET MEDIUM DRAPE 40X70 STRL (DRAPES) ×3 IMPLANT
SOLUTION BUTLER CLEAR DIP (MISCELLANEOUS) IMPLANT
SPONGE TONSIL 1 RF SGL (DISPOSABLE) IMPLANT
SPONGE TONSIL 1.25 RF SGL STRG (GAUZE/BANDAGES/DRESSINGS) ×2 IMPLANT
SYR BULB 3OZ (MISCELLANEOUS) ×3 IMPLANT
TOWEL OR 17X24 6PK STRL BLUE (TOWEL DISPOSABLE) ×3 IMPLANT
TUBE CONNECTING 20'X1/4 (TUBING) ×1
TUBE CONNECTING 20X1/4 (TUBING) ×2 IMPLANT
TUBE SALEM SUMP 12R W/ARV (TUBING) IMPLANT
TUBE SALEM SUMP 16 FR W/ARV (TUBING) ×2 IMPLANT

## 2017-08-11 NOTE — Anesthesia Postprocedure Evaluation (Signed)
Anesthesia Post Note  Patient: Helen Greene  Procedure(s) Performed: TONSILLECTOMY (N/A )     Patient location during evaluation: PACU Anesthesia Type: General Level of consciousness: awake Pain management: pain level controlled Vital Signs Assessment: post-procedure vital signs reviewed and stable Respiratory status: spontaneous breathing Cardiovascular status: stable Postop Assessment: no apparent nausea or vomiting Anesthetic complications: no    Last Vitals:  Vitals:   08/11/17 0915 08/11/17 0945  BP: 133/89 127/80  Pulse: 81 89  Resp: 20 19  Temp:    SpO2: 99% 95%    Last Pain:  Vitals:   08/11/17 0930  TempSrc:   PainSc: 5    Pain Goal:                 Suvan Stcyr JR,JOHN Stephene Alegria

## 2017-08-11 NOTE — Op Note (Signed)
08/11/2017  8:37 AM  PATIENT:  Helen Greene  47 y.o. female  PRE-OPERATIVE DIAGNOSIS:  RECURRENT OTITIS MEDIA  POST-OPERATIVE DIAGNOSIS:  RECURRENT OTITIS MEDIA  PROCEDURE:  Procedure(s): TONSILLECTOMY  SURGEON:  Surgeon(s): Serena Colonelosen, Bowman Higbie, MD  ANESTHESIA:   General  COUNTS: Correct   DICTATION: The patient was taken to the operating room and placed on the operating table in the supine position. Following induction of general endotracheal anesthesia, the table was turned and the patient was draped in a standard fashion. A Crowe-Davis mouthgag was inserted into the oral cavity and used to retract the tongue and mandible, then attached to the Mayo stand.  The tonsillectomy was then performed using electrocautery dissection, carefully dissecting the avascular plane between the capsule and constrictor muscles. Cautery was used for completion of hemostasis. The tonsils were cryptic and filled with debris , and were discarded.  The pharynx was irrigated with saline and suctioned. An oral gastric tube was used to aspirate the contents of the stomach. The patient was then awakened from anesthesia and transferred to PACU in stable condition.   PATIENT DISPOSITION:  To PACA, stable

## 2017-08-11 NOTE — Progress Notes (Signed)
Pt crying and states she is missing her mother that passed away in 2014.  She states she is a very emotional person and knew she would be upset after anesthesia.  Boyfriend is support person. Emotional support given [facility].

## 2017-08-11 NOTE — Anesthesia Preprocedure Evaluation (Signed)
Anesthesia Evaluation  Patient identified by MRN, date of birth, ID band Patient awake    Reviewed: Allergy & Precautions, NPO status , Patient's Chart, lab work & pertinent test results  History of Anesthesia Complications (+) PONV and history of anesthetic complications  Airway Mallampati: II  TM Distance: >3 FB Neck ROM: Full    Dental no notable dental hx. (+) Dental Advisory Given   Pulmonary sleep apnea ,    Pulmonary exam normal breath sounds clear to auscultation       Cardiovascular hypertension, Pt. on medications Normal cardiovascular exam Rhythm:Regular Rate:Normal     Neuro/Psych  Headaches, PSYCHIATRIC DISORDERS Anxiety Depression    GI/Hepatic Neg liver ROS, GERD  Medicated and Controlled,  Endo/Other  Morbid obesityobesity  Renal/GU negative Renal ROS  negative genitourinary   Musculoskeletal negative musculoskeletal ROS (+)   Abdominal (+) + obese,   Peds negative pediatric ROS (+)  Hematology negative hematology ROS (+)   Anesthesia Other Findings   Reproductive/Obstetrics                             Anesthesia Physical  Anesthesia Plan  ASA: III  Anesthesia Plan: General   Post-op Pain Management:    Induction: Intravenous  PONV Risk Score and Plan: 4 or greater and Ondansetron, Dexamethasone, Midazolam and Scopolamine patch - Pre-op  Airway Management Planned: Oral ETT  Additional Equipment:   Intra-op Plan:   Post-operative Plan: Extubation in OR  Informed Consent: I have reviewed the patients History and Physical, chart, labs and discussed the procedure including the risks, benefits and alternatives for the proposed anesthesia with the patient or authorized representative who has indicated his/her understanding and acceptance.   Dental advisory given  Plan Discussed with: CRNA and Surgeon  Anesthesia Plan Comments:         Anesthesia Quick  Evaluation

## 2017-08-11 NOTE — Transfer of Care (Signed)
Immediate Anesthesia Transfer of Care Note  Patient: Helen Greene  Procedure(s) Performed: TONSILLECTOMY (N/A )  Patient Location: PACU  Anesthesia Type:General  Level of Consciousness: sedated  Airway & Oxygen Therapy: Patient Spontanous Breathing and Patient connected to face mask oxygen  Post-op Assessment: Report given to RN and Post -op Vital signs reviewed and stable  Post vital signs: Reviewed and stable  Last Vitals:  Vitals:   08/11/17 0741  BP: (!) 134/92  Pulse: 91  Temp: 36.9 C  SpO2: 96%    Last Pain:  Vitals:   08/11/17 0741  TempSrc: Oral         Complications: No apparent anesthesia complications

## 2017-08-11 NOTE — Anesthesia Procedure Notes (Signed)
Procedure Name: Intubation Date/Time: 08/11/2017 8:12 AM Performed by: Maryella Shivers, CRNA Pre-anesthesia Checklist: Patient identified, Emergency Drugs available, Suction available and Patient being monitored Patient Re-evaluated:Patient Re-evaluated prior to induction Oxygen Delivery Method: Circle system utilized Preoxygenation: Pre-oxygenation with 100% oxygen Induction Type: IV induction Ventilation: Mask ventilation without difficulty Laryngoscope Size: Mac and 3 Grade View: Grade I Tube type: Oral Tube size: 7.0 mm Number of attempts: 1 Airway Equipment and Method: Stylet and Oral airway Placement Confirmation: ETT inserted through vocal cords under direct vision,  positive ETCO2 and breath sounds checked- equal and bilateral Secured at: 19 cm Tube secured with: Tape Dental Injury: Teeth and Oropharynx as per pre-operative assessment

## 2017-08-11 NOTE — Interval H&P Note (Signed)
History and Physical Interval Note:  08/11/2017 7:47 AM  Helen Greene  has presented today for surgery, with the diagnosis of TONSILLAR CALCULUS  The various methods of treatment have been discussed with the patient and family. After consideration of risks, benefits and other options for treatment, the patient has consented to  Procedure(s): TONSILLECTOMY (N/A) as a surgical intervention .  The patient's history has been reviewed, patient examined, no change in status, stable for surgery.  I have reviewed the patient's chart and labs.  Questions were answered to the patient's satisfaction.     Serena ColonelJefry Charo Philipp

## 2017-08-11 NOTE — Discharge Instructions (Signed)
°Post Anesthesia Home Care Instructions ° °Activity: °Get plenty of rest for the remainder of the day. A responsible individual must stay with you for 24 hours following the procedure.  °For the next 24 hours, DO NOT: °-Drive a car °-Operate machinery °-Drink alcoholic beverages °-Take any medication unless instructed by your physician °-Make any legal decisions or sign important papers. ° °Meals: °Start with liquid foods such as gelatin or soup. Progress to regular foods as tolerated. Avoid greasy, spicy, heavy foods. If nausea and/or vomiting occur, drink only clear liquids until the nausea and/or vomiting subsides. Call your physician if vomiting continues. ° °Special Instructions/Symptoms: °Your throat may feel dry or sore from the anesthesia or the breathing tube placed in your throat during surgery. If this causes discomfort, gargle with warm salt water. The discomfort should disappear within 24 hours. ° °If you had a scopolamine patch placed behind your ear for the management of post- operative nausea and/or vomiting: ° °1. The medication in the patch is effective for 72 hours, after which it should be removed.  Wrap patch in a tissue and discard in the trash. Wash hands thoroughly with soap and water. °2. You may remove the patch earlier than 72 hours if you experience unpleasant side effects which may include dry mouth, dizziness or visual disturbances. °3. Avoid touching the patch. Wash your hands with soap and water after contact with the patch. ° °  °Tonsillectomy, Adult, Care After °This sheet gives you information about how to care for yourself after your procedure. Your doctor may also give you more specific instructions. If you have problems or questions, contact your doctor. °Follow these instructions at home: °Eating and drinking °· Follow instructions from your doctor about eating and drinking. °· For many days after surgery, choose foods that are soft and cold. Examples  are: °? Gelatin. °? Sherbet. °? Ice cream. °? Frozen ice pops. °· If you have an upset stomach (are nauseous), choose liquids that are cold and that you can see through (clear liquids). Examples are water and apple juice without pulp. You can try thick liquids and soft foods when you can eat without throwing up (vomiting) and without too much pain. Examples are: °? Creamed soups. °? Soft, warm cereals, such as oatmeal or hot wheat cereal. °? Milk. °? Mashed potatoes. °? Applesauce. °· Drink enough fluid to keep your pee (urine) clear or pale yellow. °Driving °· Do not drive for 24 hours if you were given a medicine to help you relax (sedative). °· Do not drive or use heavy machinery while taking prescription pain medicine or until your health care provider approves. °General instructions °· Rest. °· Keep your head raised (elevated) when lying down. °· Take medicines only as told by your doctor. These include over-the-counter medicines and prescription medicines. °· Do not use mouthwashes until your doctor says it is okay. °· Gargle only as told by your doctor. °· Stay away from people who are sick. °Contact a doctor if: °· Your pain gets worse or medicines do not help. °· You have a fever. °· You have a rash. °· You feel light-headed or you pass out (faint). °· You cannot swallow even a little liquid or spit (saliva). °· Your pee is very dark. °Get help right away if: °· You have trouble breathing. °· You bleed bright red blood from your throat. °· You throw up bright red blood. °Summary °· Follow instructions from your doctor about what you cannot eat or drink. For   many days after surgery, choose foods that are soft and cold. °· Talk with your doctor about how to keep your pain under control. This can help you rest and swallow better. °· Get help right away if you bleed bright red blood from your throat or you throw up bright red blood. °This information is not intended to replace advice given to you by your health  care provider. Make sure you discuss any questions you have with your health care provider. °Document Released: 07/06/2010 Document Revised: 04/26/2016 Document Reviewed: 04/26/2016 °Elsevier Interactive Patient Education © 2017 [employer] ° °

## 2017-08-12 ENCOUNTER — Encounter (HOSPITAL_BASED_OUTPATIENT_CLINIC_OR_DEPARTMENT_OTHER): Payer: Self-pay | Admitting: Otolaryngology

## 2017-08-15 ENCOUNTER — Encounter (HOSPITAL_COMMUNITY): Payer: Self-pay | Admitting: Emergency Medicine

## 2017-08-15 ENCOUNTER — Emergency Department (HOSPITAL_COMMUNITY): Payer: 59

## 2017-08-15 ENCOUNTER — Inpatient Hospital Stay (HOSPITAL_COMMUNITY): Payer: 59

## 2017-08-15 ENCOUNTER — Inpatient Hospital Stay (HOSPITAL_COMMUNITY)
Admission: EM | Admit: 2017-08-15 | Discharge: 2017-08-19 | DRG: 871 | Disposition: A | Discharge status: Home / Self Care | Payer: 59 | Attending: Internal Medicine | Admitting: Internal Medicine

## 2017-08-15 ENCOUNTER — Other Ambulatory Visit: Payer: Self-pay

## 2017-08-15 DIAGNOSIS — Z806 Family history of leukemia: Secondary | ICD-10-CM | POA: Diagnosis not present

## 2017-08-15 DIAGNOSIS — A419 Sepsis, unspecified organism: Secondary | ICD-10-CM | POA: Diagnosis present

## 2017-08-15 DIAGNOSIS — A403 Sepsis due to Streptococcus pneumoniae: Principal | ICD-10-CM | POA: Diagnosis present

## 2017-08-15 DIAGNOSIS — J9589 Other postprocedural complications and disorders of respiratory system, not elsewhere classified: Secondary | ICD-10-CM

## 2017-08-15 DIAGNOSIS — E876 Hypokalemia: Secondary | ICD-10-CM | POA: Diagnosis not present

## 2017-08-15 DIAGNOSIS — Z8249 Family history of ischemic heart disease and other diseases of the circulatory system: Secondary | ICD-10-CM | POA: Diagnosis not present

## 2017-08-15 DIAGNOSIS — G4733 Obstructive sleep apnea (adult) (pediatric): Secondary | ICD-10-CM | POA: Diagnosis present

## 2017-08-15 DIAGNOSIS — J189 Pneumonia, unspecified organism: Secondary | ICD-10-CM

## 2017-08-15 DIAGNOSIS — J322 Chronic ethmoidal sinusitis: Secondary | ICD-10-CM | POA: Diagnosis present

## 2017-08-15 DIAGNOSIS — G2581 Restless legs syndrome: Secondary | ICD-10-CM | POA: Diagnosis present

## 2017-08-15 DIAGNOSIS — R1031 Right lower quadrant pain: Secondary | ICD-10-CM | POA: Diagnosis present

## 2017-08-15 DIAGNOSIS — I9589 Other hypotension: Secondary | ICD-10-CM | POA: Diagnosis present

## 2017-08-15 DIAGNOSIS — R6520 Severe sepsis without septic shock: Secondary | ICD-10-CM | POA: Diagnosis present

## 2017-08-15 DIAGNOSIS — R7989 Other specified abnormal findings of blood chemistry: Secondary | ICD-10-CM

## 2017-08-15 DIAGNOSIS — J320 Chronic maxillary sinusitis: Secondary | ICD-10-CM | POA: Diagnosis present

## 2017-08-15 DIAGNOSIS — K7200 Acute and subacute hepatic failure without coma: Secondary | ICD-10-CM | POA: Diagnosis present

## 2017-08-15 DIAGNOSIS — J13 Pneumonia due to Streptococcus pneumoniae: Secondary | ICD-10-CM | POA: Diagnosis present

## 2017-08-15 DIAGNOSIS — I10 Essential (primary) hypertension: Secondary | ICD-10-CM | POA: Diagnosis not present

## 2017-08-15 DIAGNOSIS — Z6841 Body Mass Index (BMI) 40.0 and over, adult: Secondary | ICD-10-CM | POA: Diagnosis not present

## 2017-08-15 DIAGNOSIS — N179 Acute kidney failure, unspecified: Secondary | ICD-10-CM

## 2017-08-15 DIAGNOSIS — D72823 Leukemoid reaction: Secondary | ICD-10-CM | POA: Diagnosis not present

## 2017-08-15 DIAGNOSIS — E861 Hypovolemia: Secondary | ICD-10-CM | POA: Diagnosis present

## 2017-08-15 DIAGNOSIS — R109 Unspecified abdominal pain: Secondary | ICD-10-CM

## 2017-08-15 DIAGNOSIS — F419 Anxiety disorder, unspecified: Secondary | ICD-10-CM | POA: Diagnosis present

## 2017-08-15 DIAGNOSIS — G4730 Sleep apnea, unspecified: Secondary | ICD-10-CM | POA: Diagnosis present

## 2017-08-15 DIAGNOSIS — Z9089 Acquired absence of other organs: Secondary | ICD-10-CM | POA: Diagnosis not present

## 2017-08-15 HISTORY — DX: Pneumonia, unspecified organism: J18.9

## 2017-08-15 LAB — BLOOD CULTURE ID PANEL (REFLEXED)
Acinetobacter baumannii: NOT DETECTED
CANDIDA ALBICANS: NOT DETECTED
CANDIDA GLABRATA: NOT DETECTED
CANDIDA KRUSEI: NOT DETECTED
CANDIDA TROPICALIS: NOT DETECTED
Candida parapsilosis: NOT DETECTED
ENTEROBACTERIACEAE SPECIES: NOT DETECTED
ENTEROCOCCUS SPECIES: NOT DETECTED
ESCHERICHIA COLI: NOT DETECTED
Enterobacter cloacae complex: NOT DETECTED
HAEMOPHILUS INFLUENZAE: NOT DETECTED
KLEBSIELLA OXYTOCA: NOT DETECTED
KLEBSIELLA PNEUMONIAE: NOT DETECTED
LISTERIA MONOCYTOGENES: NOT DETECTED
Neisseria meningitidis: NOT DETECTED
Proteus species: NOT DETECTED
Pseudomonas aeruginosa: NOT DETECTED
STREPTOCOCCUS PYOGENES: NOT DETECTED
Serratia marcescens: NOT DETECTED
Staphylococcus aureus (BCID): NOT DETECTED
Staphylococcus species: NOT DETECTED
Streptococcus agalactiae: NOT DETECTED
Streptococcus pneumoniae: DETECTED — AB
Streptococcus species: DETECTED — AB

## 2017-08-15 LAB — SODIUM, URINE, RANDOM: Sodium, Ur: 95 mmol/L

## 2017-08-15 LAB — URINALYSIS, COMPLETE (UACMP) WITH MICROSCOPIC
Bacteria, UA: NONE SEEN
Bilirubin Urine: NEGATIVE
Glucose, UA: NEGATIVE mg/dL
Ketones, ur: NEGATIVE mg/dL
Leukocytes, UA: NEGATIVE
Nitrite: NEGATIVE
Protein, ur: 30 mg/dL — AB
Specific Gravity, Urine: 1.011 (ref 1.005–1.030)
pH: 6 (ref 5.0–8.0)

## 2017-08-15 LAB — COMPREHENSIVE METABOLIC PANEL
ALBUMIN: 2.5 g/dL — AB (ref 3.5–5.0)
ALT: 94 U/L — AB (ref 14–54)
ANION GAP: 10 (ref 5–15)
AST: 53 U/L — ABNORMAL HIGH (ref 15–41)
Alkaline Phosphatase: 165 U/L — ABNORMAL HIGH (ref 38–126)
BUN: 22 mg/dL — ABNORMAL HIGH (ref 6–20)
CHLORIDE: 101 mmol/L (ref 101–111)
CO2: 24 mmol/L (ref 22–32)
Calcium: 7.3 mg/dL — ABNORMAL LOW (ref 8.9–10.3)
Creatinine, Ser: 2.59 mg/dL — ABNORMAL HIGH (ref 0.44–1.00)
GFR calc non Af Amer: 21 mL/min — ABNORMAL LOW (ref >60)
GFR, EST AFRICAN AMERICAN: 24 mL/min — AB (ref >60)
GLUCOSE: 152 mg/dL — AB (ref 65–99)
Potassium: 3.7 mmol/L (ref 3.5–5.1)
SODIUM: 135 mmol/L (ref 135–145)
Total Bilirubin: 2 mg/dL — ABNORMAL HIGH (ref 0.3–1.2)
Total Protein: 6 g/dL — ABNORMAL LOW (ref 6.5–8.1)

## 2017-08-15 LAB — PROTIME-INR
INR: 1.2
Prothrombin Time: 15.1 s (ref 11.4–15.2)

## 2017-08-15 LAB — HIV ANTIBODY (ROUTINE TESTING W REFLEX): HIV SCREEN 4TH GENERATION: NONREACTIVE

## 2017-08-15 LAB — INFLUENZA PANEL BY PCR (TYPE A & B)
Influenza A By PCR: NEGATIVE
Influenza B By PCR: NEGATIVE

## 2017-08-15 LAB — MRSA PCR SCREENING: MRSA by PCR: NEGATIVE

## 2017-08-15 LAB — DIFFERENTIAL
BASOS ABS: 0 10*3/uL (ref 0.0–0.1)
Basophils Relative: 0 %
Eosinophils Absolute: 0 10*3/uL (ref 0.0–0.7)
Eosinophils Relative: 0 %
Lymphocytes Relative: 6 %
Lymphs Abs: 3 10*3/uL (ref 0.7–4.0)
MONOS PCT: 4 %
Monocytes Absolute: 2 10*3/uL — ABNORMAL HIGH (ref 0.1–1.0)
NEUTROS PCT: 90 %
Neutro Abs: 44.4 10*3/uL — ABNORMAL HIGH (ref 1.7–7.7)

## 2017-08-15 LAB — CBC WITH DIFFERENTIAL/PLATELET
BAND NEUTROPHILS: 17 %
Basophils Absolute: 0 10*3/uL (ref 0.0–0.1)
Basophils Relative: 0 %
Blasts: 0 %
EOS ABS: 0 10*3/uL (ref 0.0–0.7)
EOS PCT: 0 %
HCT: 41.3 % (ref 36.0–46.0)
HEMOGLOBIN: 13.6 g/dL (ref 12.0–15.0)
LYMPHS ABS: 2.7 10*3/uL (ref 0.7–4.0)
Lymphocytes Relative: 6 %
MCH: 30.9 pg (ref 26.0–34.0)
MCHC: 32.9 g/dL (ref 30.0–36.0)
MCV: 93.9 fL (ref 78.0–100.0)
MONO ABS: 1.3 10*3/uL — AB (ref 0.1–1.0)
MYELOCYTES: 1 %
Metamyelocytes Relative: 0 %
Monocytes Relative: 3 %
Neutro Abs: 40.4 10*3/uL — ABNORMAL HIGH (ref 1.7–7.7)
Neutrophils Relative %: 73 %
Other: 0 %
PLATELETS: 257 10*3/uL (ref 150–400)
PROMYELOCYTES ABS: 0 %
RBC: 4.4 MIL/uL (ref 3.87–5.11)
RDW: 13.5 % (ref 11.5–15.5)
WBC: 44.4 10*3/uL — ABNORMAL HIGH (ref 4.0–10.5)
nRBC: 0 /100 WBC

## 2017-08-15 LAB — CBC
HCT: 41.9 % (ref 36.0–46.0)
HEMOGLOBIN: 13.8 g/dL (ref 12.0–15.0)
MCH: 31.1 pg (ref 26.0–34.0)
MCHC: 32.9 g/dL (ref 30.0–36.0)
MCV: 94.4 fL (ref 78.0–100.0)
PLATELETS: 326 10*3/uL (ref 150–400)
RBC: 4.44 MIL/uL (ref 3.87–5.11)
RDW: 13.5 % (ref 11.5–15.5)
WBC: 49.4 10*3/uL — ABNORMAL HIGH (ref 4.0–10.5)

## 2017-08-15 LAB — URINALYSIS, ROUTINE W REFLEX MICROSCOPIC
BILIRUBIN URINE: NEGATIVE
GLUCOSE, UA: NEGATIVE mg/dL
HGB URINE DIPSTICK: NEGATIVE
Ketones, ur: NEGATIVE mg/dL
Leukocytes, UA: NEGATIVE
Nitrite: NEGATIVE
Protein, ur: NEGATIVE mg/dL
SPECIFIC GRAVITY, URINE: 1.011 (ref 1.005–1.030)
pH: 8 (ref 5.0–8.0)

## 2017-08-15 LAB — RAPID URINE DRUG SCREEN, HOSP PERFORMED
AMPHETAMINES: NOT DETECTED
BARBITURATES: NOT DETECTED
Benzodiazepines: NOT DETECTED
COCAINE: NOT DETECTED
Opiates: POSITIVE — AB
TETRAHYDROCANNABINOL: NOT DETECTED

## 2017-08-15 LAB — APTT: APTT: 33 s (ref 24–36)

## 2017-08-15 LAB — BASIC METABOLIC PANEL
Anion gap: 17 — ABNORMAL HIGH (ref 5–15)
BUN: 22 mg/dL — ABNORMAL HIGH (ref 6–20)
CALCIUM: 8.6 mg/dL — AB (ref 8.9–10.3)
CO2: 23 mmol/L (ref 22–32)
CREATININE: 3.44 mg/dL — AB (ref 0.44–1.00)
Chloride: 93 mmol/L — ABNORMAL LOW (ref 101–111)
GFR calc Af Amer: 17 mL/min — ABNORMAL LOW (ref >60)
GFR calc non Af Amer: 15 mL/min — ABNORMAL LOW (ref >60)
GLUCOSE: 147 mg/dL — AB (ref 65–99)
Potassium: 3.6 mmol/L (ref 3.5–5.1)
Sodium: 133 mmol/L — ABNORMAL LOW (ref 135–145)

## 2017-08-15 LAB — STREP PNEUMONIAE URINARY ANTIGEN: STREP PNEUMO URINARY ANTIGEN: NEGATIVE

## 2017-08-15 LAB — I-STAT CG4 LACTIC ACID, ED
Lactic Acid, Venous: 3.85 mmol/L (ref 0.5–1.9)
Lactic Acid, Venous: 1.62 mmol/L (ref 0.5–1.9)

## 2017-08-15 LAB — LACTIC ACID, PLASMA
Lactic Acid, Venous: 1.8 mmol/L (ref 0.5–1.9)
Lactic Acid, Venous: 1.7 mmol/L (ref 0.5–1.9)

## 2017-08-15 LAB — TROPONIN I: Troponin I: <0.03 ng/mL (ref <0.03)

## 2017-08-15 LAB — PROCALCITONIN: Procalcitonin: 36.07 ng/mL

## 2017-08-15 LAB — CBG MONITORING, ED: GLUCOSE-CAPILLARY: 149 mg/dL — AB (ref 65–99)

## 2017-08-15 LAB — CORTISOL: CORTISOL PLASMA: 23.5 ug/dL

## 2017-08-15 LAB — CREATININE, URINE, RANDOM: Creatinine, Urine: 106.21 mg/dL

## 2017-08-15 MED ORDER — SODIUM CHLORIDE 0.9 % IV SOLN
2.0000 g | Freq: Once | INTRAVENOUS | Status: AC
  Administered 2017-08-15: 2 g via INTRAVENOUS
  Filled 2017-08-15: qty 2

## 2017-08-15 MED ORDER — SODIUM CHLORIDE 0.9 % IV SOLN
2.0000 g | INTRAVENOUS | Status: DC
  Administered 2017-08-15 – 2017-08-19 (×4): 2 g via INTRAVENOUS
  Filled 2017-08-15 (×4): qty 20

## 2017-08-15 MED ORDER — HYDROXYZINE HCL 50 MG/ML IM SOLN
25.0000 mg | INTRAMUSCULAR | Status: DC | PRN
  Filled 2017-08-15: qty 0.5

## 2017-08-15 MED ORDER — ONDANSETRON HCL 4 MG/2ML IJ SOLN
4.0000 mg | Freq: Four times a day (QID) | INTRAMUSCULAR | Status: DC | PRN
  Administered 2017-08-15 – 2017-08-16 (×2): 4 mg via INTRAVENOUS
  Filled 2017-08-15 (×4): qty 2

## 2017-08-15 MED ORDER — PROMETHAZINE HCL 25 MG/ML IJ SOLN: 12.5000 mg | Freq: Four times a day (QID) | INTRAMUSCULAR | Status: DC | PRN

## 2017-08-15 MED ORDER — VANCOMYCIN HCL IN DEXTROSE 1-5 GM/200ML-% IV SOLN
1000.0000 mg | INTRAVENOUS | Status: DC
  Administered 2017-08-16 – 2017-08-17 (×2): 1000 mg via INTRAVENOUS
  Filled 2017-08-15 (×2): qty 200

## 2017-08-15 MED ORDER — VANCOMYCIN HCL IN DEXTROSE 1-5 GM/200ML-% IV SOLN
1000.0000 mg | Freq: Once | INTRAVENOUS | Status: AC
  Administered 2017-08-15: 1000 mg via INTRAVENOUS
  Filled 2017-08-15: qty 200

## 2017-08-15 MED ORDER — SODIUM CHLORIDE 0.9 % IV BOLUS (SEPSIS)
1000.0000 mL | Freq: Once | INTRAVENOUS | Status: AC
  Administered 2017-08-15: 1000 mL via INTRAVENOUS

## 2017-08-15 MED ORDER — ESCITALOPRAM OXALATE 10 MG PO TABS
10.0000 mg | ORAL_TABLET | Freq: Every day | ORAL | Status: DC
  Administered 2017-08-16 – 2017-08-19 (×4): 10 mg via ORAL
  Filled 2017-08-15 (×5): qty 1

## 2017-08-15 MED ORDER — BUPROPION HCL ER (XL) 300 MG PO TB24
300.0000 mg | ORAL_TABLET | Freq: Every day | ORAL | Status: DC
  Administered 2017-08-15 – 2017-08-19 (×5): 300 mg via ORAL
  Filled 2017-08-15: qty 2
  Filled 2017-08-15 (×4): qty 1

## 2017-08-15 MED ORDER — ACETAMINOPHEN 650 MG RE SUPP
650.0000 mg | RECTAL | Status: DC | PRN
  Administered 2017-08-16 (×2): 650 mg via RECTAL
  Filled 2017-08-15 (×2): qty 1

## 2017-08-15 MED ORDER — VANCOMYCIN HCL IN DEXTROSE 750-5 MG/150ML-% IV SOLN
750.0000 mg | Freq: Once | INTRAVENOUS | Status: AC
  Administered 2017-08-15: 750 mg via INTRAVENOUS
  Filled 2017-08-15: qty 150

## 2017-08-15 MED ORDER — MORPHINE SULFATE (PF) 4 MG/ML IV SOLN
1.0000 mg | INTRAVENOUS | Status: DC | PRN
  Administered 2017-08-15: 3 mg via INTRAVENOUS
  Administered 2017-08-16: 4 mg via INTRAVENOUS
  Filled 2017-08-15 (×2): qty 1

## 2017-08-15 MED ORDER — PRAMIPEXOLE DIHYDROCHLORIDE 1.5 MG PO TABS
1.5000 mg | ORAL_TABLET | Freq: Every day | ORAL | Status: DC
  Administered 2017-08-15 – 2017-08-18 (×4): 1.5 mg via ORAL
  Filled 2017-08-15 (×4): qty 1

## 2017-08-15 MED ORDER — ACETAMINOPHEN 325 MG PO TABS
650.0000 mg | ORAL_TABLET | ORAL | Status: DC | PRN
  Administered 2017-08-15 – 2017-08-19 (×4): 650 mg via ORAL
  Filled 2017-08-15 (×4): qty 2

## 2017-08-15 MED ORDER — HYDROXYZINE HCL 10 MG PO TABS
10.0000 mg | ORAL_TABLET | ORAL | Status: DC | PRN
  Administered 2017-08-15 – 2017-08-17 (×2): 10 mg via ORAL
  Filled 2017-08-15 (×5): qty 1

## 2017-08-15 MED ORDER — SODIUM CHLORIDE 0.9 % IV SOLN
INTRAVENOUS | Status: AC
  Administered 2017-08-15: 07:00:00 via INTRAVENOUS

## 2017-08-15 MED ORDER — ENOXAPARIN SODIUM 30 MG/0.3ML ~~LOC~~ SOLN
30.0000 mg | SUBCUTANEOUS | Status: DC
  Administered 2017-08-15 – 2017-08-16 (×2): 30 mg via SUBCUTANEOUS
  Filled 2017-08-15 (×2): qty 0.3

## 2017-08-15 MED ORDER — HYDROXYZINE HCL 50 MG/ML IM SOLN: 12.5000 mg | INTRAMUSCULAR | Status: DC | PRN

## 2017-08-15 MED ORDER — SODIUM CHLORIDE 0.9 % IV SOLN
1.0000 g | INTRAVENOUS | Status: DC
  Filled 2017-08-15: qty 1

## 2017-08-15 MED ORDER — TRAMADOL HCL 50 MG PO TABS
50.0000 mg | ORAL_TABLET | Freq: Four times a day (QID) | ORAL | Status: DC | PRN
  Administered 2017-08-15 (×2): 50 mg via ORAL
  Filled 2017-08-15 (×2): qty 1

## 2017-08-15 NOTE — Progress Notes (Signed)
Pharmacy Antibiotic Note  Helen Greene is a 47 y.o. female admitted on 08/15/2017 with sepsis.  Pharmacy has been consulted for vancomycin and cefepime dosing.  Admitted with flu like symptoms, concerns of OD. Currently afebrile with wbc of 49.   Plan: Vancomycin 1750mg  once then 1g  IV every 24 hours.  Goal trough 15-20 mcg/mL. Cefepime 1g IV q24 hours  Height: 4\' 11"  (149.9 cm) Weight: 202 lb (91.6 kg) IBW/kg (Calculated) : 43.2  Temp (24hrs), Avg:97.6 F (36.4 C), Min:97.6 F (36.4 C), Max:97.6 F (36.4 C)  Recent Labs  Lab 08/15/17 0335 08/15/17 0357 08/15/17 0603  WBC 49.4*  --   --   CREATININE 3.44*  --   --   LATICACIDVEN  --  3.85* 1.62    Estimated Creatinine Clearance: 20 mL/min (A) (by C-G formula based on SCr of 3.44 mg/dL (H)).    Allergies  Allergen Reactions  . Bee Venom Hives and Swelling  . Enablex [Darifenacin Hydrobromide] Other (See Comments)    Phlebitis.  . Erythromycin Hives  . Macrobid [Nitrofurantoin Monohyd Macro] Nausea And Vomiting  . Nitrofuran Derivatives Nausea And Vomiting   Thank you for allowing pharmacy to be a part of this patient's care.  Sheppard CoilFrank Devika Dragovich PharmD., BCPS Clinical Pharmacist 08/15/2017 6:32 AM

## 2017-08-15 NOTE — ED Notes (Signed)
Lunch tray ordered, regular diet 

## 2017-08-15 NOTE — H&P (Addendum)
History and Physical    Helen Greene:[id number] DOB: 1970/09/23 DOA: 08/15/2017  **Will admit patient based on the expectation that the patient will need hospitalization/ hospital care that crosses at least 2 midnights  PCP: Doristine Bosworth, MD Gentry Fitz  Attending physician: [place]  Patient coming from/Resides with: Private residence  Chief Complaint: Flulike symptoms  HPI: Helen Greene is a 47 y.o. female with medical history significant for retention, sleep apnea intolerant to CPAP, restless leg syndrome, anxiety disorder, and obesity.  Patient has a long-standing history of tonsillar stones and underwent tonsillectomy on 08/11/17.  According to the operative note surgery was uneventful and patient was discharged home on same date of surgery.  Except for expected pain (which was managed with oral narcotics) patient had been doing well until Wednesday night into Thursday morning.  Patient reports that she was "feeling bad", having chills without fever and had vomiting x1.  She remembers taking her pain medication on Thursday morning and does not remember much else.  She presented to the ER because of flulike symptoms and she was concerned that she may have inadvertently overdosed on her pain medication.  Evaluation in the ER revealed RUL pneumonia, leukocytosis 49,000+, elevated serum lactate greater than 3, and acute kidney injury with a creatinine of 3.44.  Patient has been given 3 L of IV fluid and started on an infusion at 150 cc/hr with subsequent normalization of serum lactate.  Patient's blood pressure is suboptimal with SBP readings in the high 80s low 90s.  She has been started on broad-spectrum empiric antibiotics to cover sepsis physiology with source of infection being pneumonia.  She will be admitted to the stepdown unit.  Of note when patient sat forward for pulmonary exam she began complaining of right-sided abdominal pain.  ED Course:  Vital Signs: BP 96/60   Pulse 92    Temp 97.6 F (36.4 C) (Oral)   Resp 18   Ht 4\' 11"  (1.499 m)   Wt 91.6 kg (202 lb)   LMP 09/07/2015 (Exact Date)   SpO2 99%   BMI 40.80 kg/m  CXR: RUL PNA Lab data: Sodium 133, potassium 3.6, chloride 93, CO2 23, glucose 147, BUN 22, creatinine 3.44, anion gap 17, troponin <0.03, initial serum lactate 3.85 with follow-up readings 1.62 and 1.8, pro calcitonin 36.07, white count 49,400 differential pending at time of dictation, urinalysis unremarkable, urine drug screen positive for opiates for which patient was prescribed postoperatively.  Coags normal, influenza PCR negative, blood culture and urine culture obtained in the ER. Medications and treatments: Normal saline bolus times 3 L, Maxipime 2 g IV x1, vancomycin 1 g IV x1  Review of Systems:  In addition to the HPI above,  No Headache, changes with Vision or hearing, new weakness, tingling, numbness in any extremity, dizziness, dysarthria or word finding difficulty, gait disturbance or imbalance, tremors or seizure activity No problems swallowing food or Liquids, indigestion/reflux, choking or coughing while eating, abdominal pain with or after eating No Chest pain, Cough or Shortness of Breath, palpitations, orthopnea or DOE No N/V, melena,hematochezia, dark tarry stools, constipation No dysuria, malodorous urine, hematuria or flank pain No new skin rashes, lesions, masses or bruises, No new joint pains, aches, swelling or redness No recent unintentional weight gain or loss No polyuria, polydypsia or polyphagia   Past Medical History:  Diagnosis Date  . Allergy   . Anxiety   . Depression   . Elevated white blood cell count   .  Elevated white blood cell count 09/25/2011  . GERD (gastroesophageal reflux disease)   . Headache    Migraine  . Hypertension   . Kidney stone   . Neuromuscular disorder (HCC)    carpel tunnel disease  . Restless leg syndrome   . Shortness of breath dyspnea    with exertion  . Sleep apnea     does not have CPAP machine, mostly affects back sleeping was encouraged to sleep on her sides  . Tonsillar calculus     Past Surgical History:  Procedure Laterality Date  . ABDOMINAL HYSTERECTOMY    . CHOLECYSTECTOMY    . COLONOSCOPY    . DILATION AND CURETTAGE OF UTERUS  1993  . ECTOPIC PREGNANCY SURGERY    . ESSURE TUBAL LIGATION    . LITHOTRIPSY    . ROBOTIC ASSISTED TOTAL HYSTERECTOMY WITH SALPINGECTOMY Left 09/14/2015   Procedure: ROBOTIC ASSISTED TOTAL HYSTERECTOMY WITH LEFT SALPINGECTOMY;  Surgeon: Noland Fordyce, MD;  Location: [facility];  Service: Gynecology;  Laterality: Left;  . TONSILLECTOMY N/A 08/11/2017   Procedure: TONSILLECTOMY;  Surgeon: Serena Colonel, MD;  Location: [facility];  Service: ENT;  Laterality: N/A;  . UPPER GI ENDOSCOPY    . WISDOM TOOTH EXTRACTION      Social History   Socioeconomic History  . Marital status: Divorced    Spouse name: Not on file  . Number of children: Not on file  . Years of education: Not on file  . Highest education level: Not on file  Social Needs  . Financial resource strain: Not on file  . Food insecurity - worry: Not on file  . Food insecurity - inability: Not on file  . Transportation needs - medical: Not on file  . Transportation needs - non-medical: Not on file  Occupational History  . Not on file  Tobacco Use  . Smoking status: Never Smoker  . Smokeless tobacco: Never Used  Substance and Sexual Activity  . Alcohol use: Yes    Alcohol/week: 0.0 oz    Comment: social  . Drug use: No  . Sexual activity: Not Currently    Partners: Male    Comment: 1ST intercourse- 18, partners-  2   Other Topics Concern  . Not on file  Social History Narrative  . Not on file    Mobility: Independent Work history: Not obtained   Allergies  Allergen Reactions  . Bee Venom Hives and Swelling  . Enablex [Darifenacin Hydrobromide] Other (See Comments)    Phlebitis.  . Erythromycin Hives  . Macrobid  [Nitrofurantoin Monohyd Macro] Nausea And Vomiting  . Nitrofuran Derivatives Nausea And Vomiting    Family History  Problem Relation Age of Onset  . Hypertension Mother   . Heart failure Mother   . Cancer Mother   . Heart disease Mother   . Heart disease Brother   . Hypertension Brother   . Cancer Father        Leukemia  . Hypertension Father   . Cancer Sister   . Colon cancer Neg Hx   . Esophageal cancer Neg Hx   . Rectal cancer Neg Hx   . Stomach cancer Neg Hx       Prior to Admission medications   Medication Sig Start Date End Date Taking? Authorizing Provider  buPROPion (WELLBUTRIN XL) 300 MG 24 hr tablet Take 1 tablet (300 mg total) by mouth daily. 01/14/17  Yes Stallings, Zoe A, MD  clobetasol cream (TEMOVATE) 0.05 % Apply 1 application  topically 2 (two) times daily. Thin application to left vulva. Patient taking differently: Apply 1 application topically 2 (two) times daily as needed (RASH). Thin application to left vulva. 04/08/17  Yes Genia Del, MD  clotrimazole-betamethasone (LOTRISONE) cream Apply 1 application topically daily as needed (rash).  06/28/16  Yes [provider]  cyclobenzaprine (FLEXERIL) 5 MG tablet Take 1 tablet (5 mg total) by mouth 3 (three) times daily as needed for muscle spasms. 01/14/17  Yes Stallings, Zoe A, MD  escitalopram (LEXAPRO) 10 MG tablet TAKE 1 TABLET(10 MG) BY MOUTH DAILY 07/30/17  Yes Stallings, Zoe A, MD  folic acid (FOLVITE) 1 MG tablet Take 1 tablet (1 mg total) by mouth daily. 01/14/17  Yes Doristine Bosworth, MD  HYDROcodone-acetaminophen (HYCET) 7.5-325 mg/15 ml solution Take 15 mLs by mouth 4 (four) times daily as needed for moderate pain. 08/11/17  Yes Serena Colonel, MD  hydrOXYzine (ATARAX/VISTARIL) 25 MG tablet Take 25 mg by mouth 3 (three) times daily as needed for anxiety or vomiting.   Yes [provider]  ibuprofen (ADVIL,MOTRIN) 800 MG tablet Take 1 tablet (800 mg total) by mouth every 8 (eight) hours as  needed. Patient taking differently: Take 800 mg by mouth every 8 (eight) hours as needed for moderate pain.  01/14/17  Yes Stallings, Zoe A, MD  lisinopril-hydrochlorothiazide (PRINZIDE,ZESTORETIC) 10-12.5 MG tablet TAKE 1 TABLET BY MOUTH DAILY 07/30/17  Yes Stallings, Zoe A, MD  mirabegron ER (MYRBETRIQ) 50 MG TB24 tablet Take 50 mg by mouth daily.   Yes [provider]  nystatin (MYCOSTATIN/NYSTOP) powder Apply topically 2 (two) times daily. Patient taking differently: Apply 1 g topically 2 (two) times daily as needed (RASH).  06/28/16  Yes Collie Siad A, MD  pramipexole (MIRAPEX) 1.5 MG tablet Take 1 tablet (1.5 mg total) by mouth at bedtime. 01/14/17  Yes Doristine Bosworth, MD  promethazine (PHENERGAN) 25 MG suppository Place 1 suppository (25 mg total) rectally every 6 (six) hours as needed for nausea or vomiting. 08/11/17  Yes Serena Colonel, MD  valACYclovir (VALTREX) 1000 MG tablet TAKE 2 TABLETS BY MOUTH EVERY 12 HOURS FOR ONE DAY WITH OUTBREAK Patient taking differently: TAKE 2 TABLETS BY MOUTH EVERY 12 HOURS AS NEEDED FOR ONE DAY WITH OUTBREAK 04/10/17  Yes Doristine Bosworth, MD    Physical Exam: Vitals:   08/15/17 0545 08/15/17 0600 08/15/17 0645 08/15/17 0700  BP:   (!) 85/52 96/60  Pulse: 83 95 93 92  Resp: 20 (!) 22 15 18   Temp:      TempSrc:      SpO2: 92% 96% 99% 99%  Weight:      Height:          Constitutional: Moderately toxic appearing, anxious and tearful, uncomfortable Eyes: PERRL, lids and conjunctivae normal ENMT: Mucous membranes are very dry. Posterior pharynx clear of any exudate or lesions.Normal dentition.  Neck: normal, supple, no masses but very full cervical regions laterally and somewhat tender to palpation, no thyromegaly Respiratory: coarse to auscultation bilaterally, no wheezing, no crackles but noted with faint expiratory rhonchi right mid fields up. Normal respiratory effort without accessory muscle use rest. RA Cardiovascular: Regular  slightly tachycardic rate rest underlying sinus tachycardia, no murmurs / rubs / gallops. No extremity edema. 2+ pedal pulses. No carotid bruits.  Diminished capillary refill in hands and feet greater than 2 seconds Abdomen: Focal right lower quadrant tenderness that does not radiate without guarding or rebound, no masses palpated. No hepatosplenomegaly. Bowel sounds positive.  Musculoskeletal: no clubbing / cyanosis. No joint deformity upper and lower extremities. Good ROM, no contractures. Normal muscle tone.  Skin: no rashes, lesions, ulcers. No induration Neurologic: CN 2-12 grossly intact. Sensation intact, DTR normal. Strength 5/5 x all 4 extremities.  Psychiatric: Normal judgment and insight. Alert and oriented x 3.  Very anxious mood.    Labs on Admission: I have personally reviewed following labs and imaging studies  CBC: Recent Labs  Lab 08/15/17 0335  WBC 49.4*  NEUTROABS PENDING  HGB 13.8  HCT 41.9  MCV 94.4  PLT 326   Basic Metabolic Panel: Recent Labs  Lab 08/15/17 0335  NA 133*  K 3.6  CL 93*  CO2 23  GLUCOSE 147*  BUN 22*  CREATININE 3.44*  CALCIUM 8.6*   GFR: Estimated Creatinine Clearance: 20 mL/min (A) (by C-G formula based on SCr of 3.44 mg/dL (H)). Liver Function Tests: No results for input(s): AST, ALT, ALKPHOS, BILITOT, PROT, ALBUMIN in the last 168 hours. No results for input(s): LIPASE, AMYLASE in the last 168 hours. No results for input(s): AMMONIA in the last 168 hours. Coagulation Profile: Recent Labs  Lab 08/15/17 0619  INR 1.20   Cardiac Enzymes: Recent Labs  Lab 08/15/17 0517  TROPONINI <0.03   BNP (last 3 results) No results for input(s): PROBNP in the last 8760 hours. HbA1C: No results for input(s): HGBA1C in the last 72 hours. CBG: Recent Labs  Lab 08/15/17 0413  GLUCAP 149*   Lipid Profile: No results for input(s): CHOL, HDL, LDLCALC, TRIG, CHOLHDL, LDLDIRECT in the last 72 hours. Thyroid Function Tests: No results  for input(s): TSH, T4TOTAL, FREET4, T3FREE, THYROIDAB in the last 72 hours. Anemia Panel: No results for input(s): VITAMINB12, FOLATE, FERRITIN, TIBC, IRON, RETICCTPCT in the last 72 hours. Urine analysis:    Component Value Date/Time   COLORURINE YELLOW 08/15/2017 0345   APPEARANCEUR CLEAR 08/15/2017 0345   LABSPEC 1.011 08/15/2017 0345   PHURINE 8.0 08/15/2017 0345   GLUCOSEU NEGATIVE 08/15/2017 0345   HGBUR NEGATIVE 08/15/2017 0345   BILIRUBINUR NEGATIVE 08/15/2017 0345   BILIRUBINUR small (A) 12/07/2016 1455   BILIRUBINUR neg 02/13/2015 1943   KETONESUR NEGATIVE 08/15/2017 0345   PROTEINUR NEGATIVE 08/15/2017 0345   UROBILINOGEN 0.2 12/07/2016 1455   UROBILINOGEN 0.2 07/24/2014 0949   NITRITE NEGATIVE 08/15/2017 0345   LEUKOCYTESUR NEGATIVE 08/15/2017 0345   Sepsis Labs: @LABRCNTIP (procalcitonin:4,lacticidven:4) )No results found for this or any previous visit (from the past 240 hour(s)).   Radiological Exams on Admission: Dg Chest Port 1 View  Result Date: 08/15/2017 CLINICAL DATA:  47 year old female with hypotension. EXAM: PORTABLE CHEST 1 VIEW COMPARISON:  Chest radiograph dated 07/30/2017 FINDINGS: There has been interval development of a focal area of opacity in the right upper lung field most consistent with pneumonia given the rapid interval development. Underlying mass is less likely but not entirely excluded. Correlation with clinical exam and follow-up after treatment recommended to document resolution. There is no pleural effusion or pneumothorax. The cardiac silhouette is within normal limits. No acute osseous pathology. IMPRESSION: Findings most consistent with pneumonia. Clinical correlation and follow-up to resolution recommended. Electronically Signed   By: Elgie Collard M.D.   On: 08/15/2017 06:01    EKG: (Independently reviewed) sinus tachycardia ventricular rate 112 bpm, QTC 444 ms, normal R wave rotation, no acute ischemic  changes,  Assessment/Plan Principal Problem:   Sepsis due to pneumonia/ Post-op pneumonia/History of tonsillectomy Aug 11, 2017 -Patient presents with flulike symptoms without fever with associated  acute kidney injury, profound leukocytosis and evidence of pneumonia on chest x-ray without hypoxemia (history of chronic leukocytosis ranging between 12,000 and 15,000) -Serum lactate normalized after hydration -Procalcitonin greater than 36 -Obtain CT soft tissue neck to rule out postoperative complication/infection:  1. Right upper lobe pneumonia. 2. Swelling/inflammation at tonsillectomy sites with symmetry favoring postoperative change over surgical site infection. No evidence of abscess or airway narrowing. 3. Left maxillary and ethmoid sinusitis. -Continue broad-spectrum, empiric antibiotics and narrow appropriately -Continue aggressive volume resuscitation given continue suboptimal blood pressure readings -Close monitoring in the stepdown level -Oxygen available prn-continuous pulse oximetry -No increased work of breathing but increased congested sound since hydrated so we will repeat chest x-ray (see below) -Repeat CBC now -Ultram for moderate pain, IV morphine for severe pain -Zofran/Phenergan IV for nausea and vomiting  Active Problems:   AKI (acute kidney injury)  -Baseline creatinine 0.88 -Creatinine at admission 3.44 -Patient with concomitant administration of NSAIDs as well as thiazide diuretic and ACE inhibitor prior to admission-currently on hold -Suspect a degree of ATN noting concurrent elevated anion gap metabolic acidosis -Repeat electrolyte panel now and follow daily until renal function normalized    Right sided abdominal pain -Reporting right-sided abdominal pain -Exam without guarding or rebounding -In review of medical record patient has a history of chronic constipation -Obtain baseline abdominal films and continue to follow     Anxiety -Vistaril  prn -Continue preadmission Wellbutrin and Lexapro    Hypertension -On HCTZ with lisinopril prior to admission but currently on hold secondary to AKI -Blood pressure readings suboptimal in the context of sepsis physiology    Sleep apnea -Does not utilize CPAP at home -Follow nocturnal oximetry w/ O2 prn    Restless leg syndrome -Continue Mirapex    Morbid obesity with BMI of 40.0-44.9, adult  -Risk factor for postoperative complications -Weight reduction strategies per PCP    **Additional lab, imaging and/or diagnostic evaluation at discretion of supervising physician  DVT prophylaxis: Lovenox dose adjusted for low GFR at presentation Code Status: Full Family Communication: No family at bedside Disposition Plan: Home Consults called: None but ENT/Rosen added as Research scientist (medical)consultant as courtesy    Russella DarELLIS,Dave Mergen L. ANP-BC [facility] Pager [phone]   If 7PM-7AM, please contact night-coverage [email or web address] Password [email or web address]  08/15/2017, 8:05 AM

## 2017-08-15 NOTE — ED Triage Notes (Signed)
Pt arrives with c/o flu like symptoms, dizziness, and concerns for OD on home hydrocodone. Pt cannot verify the last time she took this medication. States she's unsure if she's taking too much, pupils PERRLA . States she's taking it for tonsil surgery earlier this week. Thinks she's dehydrated too.

## 2017-08-15 NOTE — ED Notes (Signed)
ED Provider at bedside. 

## 2017-08-15 NOTE — ED Notes (Signed)
Patient being transported to x-ray.

## 2017-08-15 NOTE — ED Notes (Signed)
X-ray at bedside

## 2017-08-15 NOTE — ED Notes (Signed)
Revonda StandardAllison NP notified of pt's temp of 103.1. She is going to place order for tylenol.

## 2017-08-15 NOTE — Progress Notes (Addendum)
SBP >100 -voided large amount so will decrease IVFs to 100/hr  Pete labs reveal improvement in renal function creatinine down to 2.59.  Patient does have mild transaminitis with AST of 53 and an ALT of 94 and a total bilirubin of 2.0 likely related to shock liver.  She did have right lower quadrant abdominal pain earlier with negative abdominal series.On exam no further right side abdominal pain. If LFTs increase and or the right side pain returns may need to investigate for possible cholecystitis etiology.  Junious SilkAllison Ellis, ANP

## 2017-08-15 NOTE — ED Notes (Signed)
Pt was able to void on her own after 3 attempts to use the bed pan.

## 2017-08-15 NOTE — Progress Notes (Signed)
PHARMACY - PHYSICIAN COMMUNICATION CRITICAL VALUE ALERT - BLOOD CULTURE IDENTIFICATION (BCID)  Helen Greene is an 47 y.o. female who presented to [facility] on 08/15/2017 with a chief complaint of flu-like symptoms  Assessment:  PNA, recent tonsillectomy   Name of physician (or Provider) Contacted: X Blount ([facility])  Current antibiotics: Vancomycin/Cefepime  Changes to prescribed antibiotics recommended:  Change Cefepime to Ceftriaxone, consider DC vancomycin soon  Results for orders placed or performed during the hospital encounter of 08/15/17  Blood Culture ID Panel (Reflexed) (Collected: 08/15/2017  6:47 AM)  Result Value Ref Range   Enterococcus species NOT DETECTED NOT DETECTED   Listeria monocytogenes NOT DETECTED NOT DETECTED   Staphylococcus species NOT DETECTED NOT DETECTED   Staphylococcus aureus NOT DETECTED NOT DETECTED   Streptococcus species DETECTED (A) NOT DETECTED   Streptococcus agalactiae NOT DETECTED NOT DETECTED   Streptococcus pneumoniae DETECTED (A) NOT DETECTED   Streptococcus pyogenes NOT DETECTED NOT DETECTED   Acinetobacter baumannii NOT DETECTED NOT DETECTED   Enterobacteriaceae species NOT DETECTED NOT DETECTED   Enterobacter cloacae complex NOT DETECTED NOT DETECTED   Escherichia coli NOT DETECTED NOT DETECTED   Klebsiella oxytoca NOT DETECTED NOT DETECTED   Klebsiella pneumoniae NOT DETECTED NOT DETECTED   Proteus species NOT DETECTED NOT DETECTED   Serratia marcescens NOT DETECTED NOT DETECTED   Haemophilus influenzae NOT DETECTED NOT DETECTED   Neisseria meningitidis NOT DETECTED NOT DETECTED   Pseudomonas aeruginosa NOT DETECTED NOT DETECTED   Candida albicans NOT DETECTED NOT DETECTED   Candida glabrata NOT DETECTED NOT DETECTED   Candida krusei NOT DETECTED NOT DETECTED   Candida parapsilosis NOT DETECTED NOT DETECTED   Candida tropicalis NOT DETECTED NOT DETECTED    Abran DukeLedford, Bleu Moisan 08/15/2017  11:32 PM

## 2017-08-15 NOTE — ED Provider Notes (Signed)
[facility] EMERGENCY DEPARTMENT Provider Note   CSN: [id number] Arrival date & time: 08/15/17  0316     History   Chief Complaint Chief Complaint  Patient presents with  . Dizziness    HPI Helen Greene is a 47 y.o. female.  The history is provided by the patient.  She has history of hypertension, exertional dyspnea, restless leg syndrome and comes in because of weakness and dizziness for the last 36 hours.  Of note, she had tonsillectomy done 4 days ago.  She states that she has been having chills and sweats since coming out of anesthesia, but chills got significant he worse yesterday.  She denies fever.  There has been a cough productive of small amount of brown sputum.  She denies any urinary urgency or frequency but states that she has only had a very small amount of urine.  She thinks she has been taking adequate amounts of fluid and she denies any significant pain with swallowing.  Past Medical History:  Diagnosis Date  . Allergy   . Anxiety   . Depression   . Elevated white blood cell count   . Elevated white blood cell count 09/25/2011  . GERD (gastroesophageal reflux disease)   . Headache    Migraine  . Hypertension   . Kidney stone   . Neuromuscular disorder (HCC)    carpel tunnel disease  . Restless leg syndrome   . Shortness of breath dyspnea    with exertion  . Sleep apnea    does not have CPAP machine, mostly affects back sleeping was encouraged to sleep on her sides  . Tonsillar calculus     Patient Active Problem List   Diagnosis Date Noted  . S/P tonsillectomy 08/11/2017  . S/P total hysterectomy 09/14/2015  . Frequent headaches 03/17/2015  . RLS (restless legs syndrome) 03/17/2015  . HTN (hypertension) 05/19/2013  . Elevated white blood cell count 09/25/2011    Past Surgical History:  Procedure Laterality Date  . ABDOMINAL HYSTERECTOMY    . CHOLECYSTECTOMY    . COLONOSCOPY    . DILATION AND CURETTAGE OF UTERUS  1993  .  ECTOPIC PREGNANCY SURGERY    . ESSURE TUBAL LIGATION    . LITHOTRIPSY    . ROBOTIC ASSISTED TOTAL HYSTERECTOMY WITH SALPINGECTOMY Left 09/14/2015   Procedure: ROBOTIC ASSISTED TOTAL HYSTERECTOMY WITH LEFT SALPINGECTOMY;  Surgeon: Noland Fordyce, MD;  Location: [facility];  Service: Gynecology;  Laterality: Left;  . TONSILLECTOMY N/A 08/11/2017   Procedure: TONSILLECTOMY;  Surgeon: Serena Colonel, MD;  Location: [facility];  Service: ENT;  Laterality: N/A;  . UPPER GI ENDOSCOPY    . WISDOM TOOTH EXTRACTION      OB History    Gravida Para Term Preterm AB Living   3 2     1 2    SAB TAB Ectopic Multiple Live Births       1           Home Medications    Prior to Admission medications   Medication Sig Start Date End Date Taking? Authorizing Provider  buPROPion (WELLBUTRIN XL) 300 MG 24 hr tablet Take 1 tablet (300 mg total) by mouth daily. 01/14/17   Doristine Bosworth, MD  clobetasol cream (TEMOVATE) 0.05 % Apply 1 application topically 2 (two) times daily. Thin application to left vulva. 04/08/17   Genia Del, MD  clotrimazole-betamethasone (LOTRISONE) cream Apply 1 application topically daily as needed (rash).  06/28/16   [provider]  cyclobenzaprine (FLEXERIL) 5 MG tablet Take 1 tablet (5 mg total) by mouth 3 (three) times daily as needed for muscle spasms. 01/14/17   Doristine BosworthStallings, Zoe A, MD  escitalopram (LEXAPRO) 10 MG tablet TAKE 1 TABLET(10 MG) BY MOUTH DAILY 07/30/17   Doristine BosworthStallings, Zoe A, MD  folic acid (FOLVITE) 1 MG tablet Take 1 tablet (1 mg total) by mouth daily. 01/14/17   Doristine BosworthStallings, Zoe A, MD  HYDROcodone-acetaminophen (HYCET) 7.5-325 mg/15 ml solution Take 15 mLs by mouth 4 (four) times daily as needed for moderate pain. 08/11/17   Serena Colonelosen, Jefry, MD  hydrOXYzine (ATARAX/VISTARIL) 25 MG tablet Take 25 mg by mouth 3 (three) times daily as needed for anxiety or vomiting.    [provider]  ibuprofen (ADVIL,MOTRIN) 800 MG tablet Take 1 tablet (800 mg  total) by mouth every 8 (eight) hours as needed. 01/14/17   Doristine BosworthStallings, Zoe A, MD  lisinopril-hydrochlorothiazide (PRINZIDE,ZESTORETIC) 10-12.5 MG tablet TAKE 1 TABLET BY MOUTH DAILY 07/30/17   Collie SiadStallings, Zoe A, MD  mirabegron ER (MYRBETRIQ) 50 MG TB24 tablet Take 50 mg by mouth daily.    [provider]  nystatin (MYCOSTATIN/NYSTOP) powder Apply topically 2 (two) times daily. 06/28/16   Doristine BosworthStallings, Zoe A, MD  pramipexole (MIRAPEX) 1.5 MG tablet Take 1 tablet (1.5 mg total) by mouth at bedtime. 01/14/17   Doristine BosworthStallings, Zoe A, MD  predniSONE (DELTASONE) 20 MG tablet 3 tabs po daily x 3 days, then 2 tabs x 3 days, then 1.5 tabs x 3 days, then 1 tab x 3 days, then 0.5 tabs x 3 days 07/30/17   Arby BarrettePfeiffer, Marcy, MD  promethazine (PHENERGAN) 25 MG suppository Place 1 suppository (25 mg total) rectally every 6 (six) hours as needed for nausea or vomiting. 08/11/17   Serena Colonelosen, Jefry, MD  valACYclovir (VALTREX) 1000 MG tablet TAKE 2 TABLETS BY MOUTH EVERY 12 HOURS FOR ONE DAY WITH OUTBREAK 04/10/17   Doristine BosworthStallings, Zoe A, MD    Family History Family History  Problem Relation Age of Onset  . Hypertension Mother   . Heart failure Mother   . Cancer Mother   . Heart disease Mother   . Heart disease Brother   . Hypertension Brother   . Cancer Father        Leukemia  . Hypertension Father   . Cancer Sister   . Colon cancer Neg Hx   . Esophageal cancer Neg Hx   . Rectal cancer Neg Hx   . Stomach cancer Neg Hx     Social History Social History   Tobacco Use  . Smoking status: Never Smoker  . Smokeless tobacco: Never Used  Substance Use Topics  . Alcohol use: Yes    Alcohol/week: 0.0 oz    Comment: social  . Drug use: No     Allergies   Bee venom; Enablex [darifenacin hydrobromide]; Erythromycin; Macrobid [nitrofurantoin monohyd macro]; and Nitrofuran derivatives   Review of Systems Review of Systems  All other systems reviewed and are negative.    Physical Exam Updated Vital Signs BP (!)  80/60 (BP Location: Right Arm)   Pulse (!) 105   Temp 97.6 F (36.4 C) (Oral)   Resp 20   Ht 4\' 11"  (1.499 m)   Wt 91.6 kg (202 lb)   LMP 09/07/2015 (Exact Date)   SpO2 98%   BMI 40.80 kg/m   Physical Exam  Nursing note and vitals reviewed.  47 year old female, resting comfortably and in no acute distress. Vital signs are  significant for low blood pressure and elevated heart rate. Oxygen saturation is 98%, which is normal. Head is normocephalic and atraumatic. PERRLA, EOMI. Oropharynx shows exudate over the tonsillar fossae consistent with routine recovery from tonsillectomy.  Mucous membranes are dry. Neck is nontender and supple with tender submandibular adenopathy . Back is nontender and there is no CVA tenderness. Lungs are clear without rales, wheezes, or rhonchi. Chest is nontender. Heart has regular rate and rhythm without murmur. Abdomen is soft, flat, nontender without masses or hepatosplenomegaly and peristalsis is normoactive. Extremities have no cyanosis or edema, full range of motion is present. Skin is warm and dry without rash. Neurologic: Mental status is normal, cranial nerves are intact, there are no motor or sensory deficits.  ED Treatments / Results  Labs (all labs ordered are listed, but only abnormal results are displayed) Labs Reviewed  CBG MONITORING, ED - Abnormal; Notable for the following components:      Result Value   Glucose-Capillary 149 (*)    All other components within normal limits  I-STAT CG4 LACTIC ACID, ED - Abnormal; Notable for the following components:   Lactic Acid, Venous 3.85 (*)    All other components within normal limits  BASIC METABOLIC PANEL  CBC  URINALYSIS, ROUTINE W REFLEX MICROSCOPIC  RAPID URINE DRUG SCREEN, HOSP PERFORMED    EKG  EKG Interpretation  Date/Time:  Friday August 15 2017 03:30:53 EST Ventricular Rate:  112 PR Interval:  92 QRS Duration: 82 QT Interval:  326 QTC Calculation: 444 R Axis:   96 Text  Interpretation:  ** Suspect arm lead reversal, interpretation assumes no reversal Sinus tachycardia with short PR Lateral infarct , age undetermined Abnormal ECG When compared with ECG of 07/30/2017, Lateral Q waves noted now   Confirmed by Dione Booze ([id number]) on 08/15/2017 4:38:02 AM       Radiology Dg Chest Port 1 View  Result Date: 08/15/2017 CLINICAL DATA:  47 year old female with hypotension. EXAM: PORTABLE CHEST 1 VIEW COMPARISON:  Chest radiograph dated 07/30/2017 FINDINGS: There has been interval development of a focal area of opacity in the right upper lung field most consistent with pneumonia given the rapid interval development. Underlying mass is less likely but not entirely excluded. Correlation with clinical exam and follow-up after treatment recommended to document resolution. There is no pleural effusion or pneumothorax. The cardiac silhouette is within normal limits. No acute osseous pathology. IMPRESSION: Findings most consistent with pneumonia. Clinical correlation and follow-up to resolution recommended. Electronically Signed   By: Elgie Collard M.D.   On: 08/15/2017 06:01    Procedures Procedures  CRITICAL CARE Performed by: Dione Booze Total critical care time: 85 minutes Critical care time was exclusive of separately billable procedures and treating other patients. Critical care was necessary to treat or prevent imminent or life-threatening deterioration. Critical care was time spent personally by me on the following activities: development of treatment plan with patient and/or surrogate as well as nursing, discussions with consultants, evaluation of patient's response to treatment, examination of patient, obtaining history from patient or surrogate, ordering and performing treatments and interventions, ordering and review of laboratory studies, ordering and review of radiographic studies, pulse oximetry and re-evaluation of patient's condition.  Medications Ordered in  ED Medications  ceFEPIme (MAXIPIME) 2 g in sodium chloride 0.9 % 100 mL IVPB (2 g Intravenous New Bag/Given 08/15/17 0713)  vancomycin (VANCOCIN) IVPB 1000 mg/200 mL premix (not administered)  0.9 %  sodium chloride infusion ( Intravenous New Bag/Given 08/15/17 0714)  vancomycin (VANCOCIN) IVPB 750 mg/150 ml premix (not administered)  ceFEPIme (MAXIPIME) 1 g in sodium chloride 0.9 % 100 mL IVPB (not administered)  vancomycin (VANCOCIN) IVPB 1000 mg/200 mL premix (not administered)  sodium chloride 0.9 % bolus 1,000 mL (0 mLs Intravenous Stopped 08/15/17 0549)  sodium chloride 0.9 % bolus 1,000 mL (0 mLs Intravenous Stopped 08/15/17 0549)  sodium chloride 0.9 % bolus 1,000 mL (1,000 mLs Intravenous New Bag/Given 08/15/17 0601)     Initial Impression / Assessment and Plan / ED Course  I have reviewed the triage vital signs and the nursing notes.  Pertinent labs & imaging results that were available during my care of the patient were reviewed by me and considered in my medical decision making (see chart for details).  Hypotension which seems most likely to be due to dehydration.  Lactic acid level had been obtained prior to my seeing the patient and is elevated, but she is not clinically septic.  Elevated lactate was felt to be due to hypotension and hypovolemia.  She is not febrile.  Will check chest x-ray and urinalysis for evidence of occult infection.  Old records reviewed confirming recent tonsillectomy.  She will be given aggressive IV hydration.  Of note, ECG shows lateral wall Q waves which could be due to limb lead reversal, but I have reviewed the placement of her leads and the are appropriate.  Therefore, will check troponin.  Repeat lactic acid level is normal.  Embolic panel shows a creatinine of 3.44 indicative of acute renal failure.  WBC is markedly elevated at 49.4 and is felt to be a leukemoid reaction.  Urinalysis did not show evidence of infection.  At this point, chest x-ray was pending.   Blood pressure had improved slightly and additional fluids were being given.  Case was discussed with Dr. Antionette Char of [facility] hospitalist who agrees to admit the patient.  Following my discussion with him, chest x-ray came back showing evidence of pneumonia.  Picture is now felt to be consistent with sepsis due to pneumonia and appropriate antibiotics were initiated.  Final Clinical Impressions(s) / ED Diagnoses   Final diagnoses:  Acute kidney injury (nontraumatic) (HCC)  Hypotension due to hypovolemia  Elevated lactic acid level  Leukemoid reaction  Sepsis due to pneumonia [facility])    ED Discharge Orders    None       Dione Booze, MD 08/15/17 [phone]

## 2017-08-16 DIAGNOSIS — A419 Sepsis, unspecified organism: Secondary | ICD-10-CM

## 2017-08-16 DIAGNOSIS — J189 Pneumonia, unspecified organism: Secondary | ICD-10-CM

## 2017-08-16 LAB — CBC WITH DIFFERENTIAL/PLATELET
Basophils Absolute: 0 10*3/uL (ref 0.0–0.1)
Basophils Relative: 0 %
EOS ABS: 0 10*3/uL (ref 0.0–0.7)
Eosinophils Relative: 0 %
HEMATOCRIT: 35.9 % — AB (ref 36.0–46.0)
Hemoglobin: 11.9 g/dL — ABNORMAL LOW (ref 12.0–15.0)
LYMPHS ABS: 1.7 10*3/uL (ref 0.7–4.0)
Lymphocytes Relative: 5 %
MCH: 31 pg (ref 26.0–34.0)
MCHC: 33.1 g/dL (ref 30.0–36.0)
MCV: 93.5 fL (ref 78.0–100.0)
MONO ABS: 1.3 10*3/uL — AB (ref 0.1–1.0)
MONOS PCT: 4 %
NEUTROS ABS: 30.3 10*3/uL — AB (ref 1.7–7.7)
Neutrophils Relative %: 91 %
PLATELETS: 267 10*3/uL (ref 150–400)
RBC: 3.84 MIL/uL — ABNORMAL LOW (ref 3.87–5.11)
RDW: 13.9 % (ref 11.5–15.5)
WBC: 33.3 10*3/uL — ABNORMAL HIGH (ref 4.0–10.5)

## 2017-08-16 LAB — URINE CULTURE: Culture: NO GROWTH

## 2017-08-16 LAB — UREA NITROGEN, URINE: UREA NITROGEN UR: 247 mg/dL

## 2017-08-16 LAB — COMPREHENSIVE METABOLIC PANEL
ALBUMIN: 2.1 g/dL — AB (ref 3.5–5.0)
ALK PHOS: 163 U/L — AB (ref 38–126)
ALT: 62 U/L — AB (ref 14–54)
AST: 29 U/L (ref 15–41)
Anion gap: 10 (ref 5–15)
BILIRUBIN TOTAL: 0.7 mg/dL (ref 0.3–1.2)
BUN: 17 mg/dL (ref 6–20)
CALCIUM: 7.4 mg/dL — AB (ref 8.9–10.3)
CO2: 23 mmol/L (ref 22–32)
CREATININE: 1.39 mg/dL — AB (ref 0.44–1.00)
Chloride: 101 mmol/L (ref 101–111)
GFR calc Af Amer: 51 mL/min — ABNORMAL LOW (ref >60)
GFR, EST NON AFRICAN AMERICAN: 44 mL/min — AB (ref >60)
GLUCOSE: 125 mg/dL — AB (ref 65–99)
Potassium: 3.4 mmol/L — ABNORMAL LOW (ref 3.5–5.1)
Sodium: 134 mmol/L — ABNORMAL LOW (ref 135–145)
TOTAL PROTEIN: 5.8 g/dL — AB (ref 6.5–8.1)

## 2017-08-16 LAB — LEGIONELLA PNEUMOPHILA SEROGP 1 UR AG: L. pneumophila Serogp 1 Ur Ag: NEGATIVE

## 2017-08-16 MED ORDER — SODIUM CHLORIDE 0.9 % IV SOLN
INTRAVENOUS | Status: DC
  Administered 2017-08-16 – 2017-08-17 (×3): via INTRAVENOUS

## 2017-08-16 MED ORDER — KETOROLAC TROMETHAMINE 15 MG/ML IJ SOLN
15.0000 mg | Freq: Four times a day (QID) | INTRAMUSCULAR | Status: DC | PRN
  Administered 2017-08-17 – 2017-08-18 (×4): 15 mg via INTRAVENOUS
  Filled 2017-08-16 (×4): qty 1

## 2017-08-16 MED ORDER — POTASSIUM CHLORIDE CRYS ER 20 MEQ PO TBCR
40.0000 meq | EXTENDED_RELEASE_TABLET | Freq: Once | ORAL | Status: AC
  Administered 2017-08-16: 40 meq via ORAL
  Filled 2017-08-16: qty 2

## 2017-08-16 MED ORDER — POTASSIUM CHLORIDE 20 MEQ/15ML (10%) PO SOLN
40.0000 meq | Freq: Once | ORAL | Status: DC
  Filled 2017-08-16: qty 30

## 2017-08-16 MED ORDER — MORPHINE SULFATE (PF) 4 MG/ML IV SOLN: 1.0000 mg | INTRAVENOUS | Status: DC | PRN

## 2017-08-16 MED ORDER — TRAMADOL HCL 50 MG PO TABS
50.0000 mg | ORAL_TABLET | Freq: Four times a day (QID) | ORAL | Status: DC | PRN
  Administered 2017-08-19: 50 mg via ORAL
  Filled 2017-08-16: qty 1

## 2017-08-16 MED ORDER — KETOROLAC TROMETHAMINE 15 MG/ML IJ SOLN: 15.0000 mg | Freq: Four times a day (QID) | INTRAMUSCULAR | Status: DC | PRN

## 2017-08-16 NOTE — Progress Notes (Signed)
Patient alert and cooperatiive.  Patient  Complaining of throat pain.  Tylenol suppository given for temperature.  Morphine 3 mg given for pain.  Patient  Taking ice chips at this time.  Safety and comfort measure maintained , will continue to monitor and call bell within reach.

## 2017-08-16 NOTE — Plan of Care (Signed)
Patient is progressing in care plan goals 
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
## 2017-08-16 NOTE — Progress Notes (Signed)
PROGRESS NOTE    Helen Greene  ZOX:[id number] DOB: 04/26/1971 DOA: 08/15/2017 PCP: Doristine Bosworth, MD     Brief Narrative:  Helen Greene is a 47 year old female with a history of obstructive sleep apnea, intolerant to CPAP, restless leg syndrome, anxiety disorder, obesity who has long-standing history of tonsillar stones and underwent tonsillectomy on August 11, 2017 by Dr. Pollyann Kennedy (ENT).  After surgery, she was discharged home. Patient started having chills and fever also had one episode of vomiting.  Patient says that she has not been able to cough out phlegm also unable to blow her nose. She has had pain with swallowing food and liquids at home but tried her best to keep hydrated. In the ED, work up revealed RUL pneumonia, sepsis, AKI, hypotension. She was started on broad-spectrum antibiotics.   Assessment & Plan:   Principal Problem:   Sepsis due to pneumonia [facility]) Active Problems:   AKI (acute kidney injury) (HCC)   Right sided abdominal pain   History of tonsillectomy Feb 25,2019   Hypertension   Sleep apnea   Anxiety   Restless leg syndrome   Post-op pneumonia   Morbid obesity with BMI of 40.0-44.9, adult (HCC)   Shock liver  Sepsis due to right upper lobar pneumonia, strep pneumo bacteremia  -Influenza negative  -HIV negative  -Continue vanco/rocephin. If PCN sensitive, can discontinue vanco and continue rocephin single-therapy. Await blood culture sensitivity  -WBC improving, trend   AKI  -Baseline creatinine 0.88 -Hold ACEi and HCTZ -IVF  -Cr improving   Recent tonsillectomy -CT soft tissue neck: swelling/inflammation at tonsillectomy sites with symmetry favoring postoperative change over surgical site infection. No evidence of abscess or airway narrowing. Left maxillary and ethmoid sinusitis.  Anxiety -Vistaril prn -Continue Wellbutrin and Lexapro  Hypertension -On HCTZ with lisinopril prior to admission but currently on hold secondary to  AKI -Hypotensive on admission, BP stable this morning   OSA -Does not utilize CPAP at home  Restless leg syndrome -Continue Mirapex  Morbid obesity with BMI of 40.0-44.9, adult   Hypokalemia -Replace, trend    DVT prophylaxis: Lovenox Code Status: Full Family Communication: No family at bedside Disposition Plan: Pending improvement, suspect return back home    Consultants:   None  Procedures:   None   Antimicrobials:  Anti-infectives (From admission, onward)   Start     Dose/Rate Route Frequency Ordered Stop   08/16/17 0800  vancomycin (VANCOCIN) IVPB 1000 mg/200 mL premix     1,000 mg 200 mL/hr over 60 Minutes Intravenous Every 24 hours 08/15/17 0631     08/16/17 0600  ceFEPIme (MAXIPIME) 1 g in sodium chloride 0.9 % 100 mL IVPB  Status:  Discontinued     1 g 200 mL/hr over 30 Minutes Intravenous Every 24 hours 08/15/17 0631 08/15/17 2329   08/15/17 2345  cefTRIAXone (ROCEPHIN) 2 g in sodium chloride 0.9 % 100 mL IVPB     2 g 200 mL/hr over 30 Minutes Intravenous Every 24 hours 08/15/17 2330     08/15/17 0700  vancomycin (VANCOCIN) IVPB 750 mg/150 ml premix     750 mg 150 mL/hr over 60 Minutes Intravenous  Once 08/15/17 0630 08/15/17 1011   08/15/17 0630  ceFEPIme (MAXIPIME) 2 g in sodium chloride 0.9 % 100 mL IVPB     2 g 200 mL/hr over 30 Minutes Intravenous  Once 08/15/17 0621 08/15/17 0743   08/15/17 0630  vancomycin (VANCOCIN) IVPB 1000 mg/200 mL premix  1,000 mg 200 mL/hr over 60 Minutes Intravenous  Once 08/15/17 7829 08/15/17 0849       Subjective: Patient complains of generalized weakness, difficulty swallowing food due to recent surgery.   Objective: Vitals:   08/15/17 2100 08/15/17 2200 08/15/17 2336 08/16/17 0539  BP:   (!) 103/55 134/86  Pulse:   (!) 112 97  Resp:   20 20  Temp:   (!) 100.8 F (38.2 C) 99.1 F (37.3 C)  TempSrc:   Oral Axillary  SpO2: 93% (!) 84% 91% 96%  Weight:      Height:        Intake/Output Summary  (Last 24 hours) at 08/16/2017 1006 Last data filed at 08/16/2017 0536 Gross per 24 hour  Intake 1169.17 ml  Output 1150 ml  Net 19.17 ml   Filed Weights   08/15/17 0340 08/15/17 1415  Weight: 91.6 kg (202 lb) 91.6 kg (201 lb 15.1 oz)    Examination:  General exam: Appears calm and uncomfortable, ill appearing  ENT: bilateral tonsillar post-op region with white plaque bilaterally  Respiratory system: RUL crackles. Respiratory effort normal on room air. No dyspnea or distress  Cardiovascular system: S1 & S2 heard, RRR. No JVD, murmurs, rubs, gallops or clicks. No pedal edema. Gastrointestinal system: Abdomen is nondistended, soft and nontender. No organomegaly or masses felt. Normal bowel sounds heard. Central nervous system: Alert and oriented. No focal neurological deficits. Extremities: Symmetric 5 x 5 power. Skin: No rashes, lesions or ulcers Psychiatry: Judgement and insight appear normal. Mood & affect appropriate.   Data Reviewed: I have personally reviewed following labs and imaging studies  CBC: Recent Labs  Lab 08/15/17 0335 08/15/17 0923 08/16/17 0242  WBC 49.4* 44.4* 33.3*  NEUTROABS 44.4* 40.4* 30.3*  HGB 13.8 13.6 11.9*  HCT 41.9 41.3 35.9*  MCV 94.4 93.9 93.5  PLT 326 257 267   Basic Metabolic Panel: Recent Labs  Lab 08/15/17 0335 08/15/17 0923 08/16/17 0242  NA 133* 135 134*  K 3.6 3.7 3.4*  CL 93* 101 101  CO2 23 24 23   GLUCOSE 147* 152* 125*  BUN 22* 22* 17  CREATININE 3.44* 2.59* 1.39*  CALCIUM 8.6* 7.3* 7.4*   GFR: Estimated Creatinine Clearance: 49.4 mL/min (A) (by C-G formula based on SCr of 1.39 mg/dL (H)). Liver Function Tests: Recent Labs  Lab 08/15/17 0923 08/16/17 0242  AST 53* 29  ALT 94* 62*  ALKPHOS 165* 163*  BILITOT 2.0* 0.7  PROT 6.0* 5.8*  ALBUMIN 2.5* 2.1*   No results for input(s): LIPASE, AMYLASE in the last 168 hours. No results for input(s): AMMONIA in the last 168 hours. Coagulation Profile: Recent Labs  Lab  08/15/17 0619  INR 1.20   Cardiac Enzymes: Recent Labs  Lab 08/15/17 0517  TROPONINI <0.03   BNP (last 3 results) No results for input(s): PROBNP in the last 8760 hours. HbA1C: No results for input(s): HGBA1C in the last 72 hours. CBG: Recent Labs  Lab 08/15/17 0413  GLUCAP 149*   Lipid Profile: No results for input(s): CHOL, HDL, LDLCALC, TRIG, CHOLHDL, LDLDIRECT in the last 72 hours. Thyroid Function Tests: No results for input(s): TSH, T4TOTAL, FREET4, T3FREE, THYROIDAB in the last 72 hours. Anemia Panel: No results for input(s): VITAMINB12, FOLATE, FERRITIN, TIBC, IRON, RETICCTPCT in the last 72 hours. Sepsis Labs: Recent Labs  Lab 08/15/17 0357 08/15/17 0603 08/15/17 0619 08/15/17 0923  PROCALCITON  --   --  36.07  --   LATICACIDVEN 3.85* 1.62 1.8 1.7  Recent Results (from the past 240 hour(s))  Culture, blood (x 2)     Status: None (Preliminary result)   Collection Time: 08/15/17  6:47 AM  Result Value Ref Range Status   Specimen Description BLOOD LEFT FOREARM  Final   Special Requests   Final    BOTTLES DRAWN AEROBIC AND ANAEROBIC Blood Culture adequate volume Performed at [facility], [address] [place], [place] [address]    Culture  Setup Time   Final    GRAM POSITIVE COCCI ANAEROBIC BOTTLE ONLY CRITICAL VALUE NOTED.  VALUE IS CONSISTENT WITH PREVIOUSLY REPORTED AND CALLED VALUE.    Culture PENDING  Incomplete   Report Status PENDING  Incomplete  Culture, blood (x 2)     Status: None (Preliminary result)   Collection Time: 08/15/17  6:47 AM  Result Value Ref Range Status   Specimen Description BLOOD RIGHT ANTECUBITAL  Final   Special Requests   Final    BOTTLES DRAWN AEROBIC AND ANAEROBIC Blood Culture adequate volume Performed at [facility], [address] [place], [place] [address]    Culture  Setup Time   Final    GRAM POSITIVE COCCI IN BOTH AEROBIC AND ANAEROBIC BOTTLES Organism ID to follow CRITICAL RESULT CALLED  TO, READ BACK BY AND VERIFIED WITH: Lytle Butte PHARMD 2228 08/15/17 A BROWNING    Culture PENDING  Incomplete   Report Status PENDING  Incomplete  Blood Culture ID Panel (Reflexed)     Status: Abnormal   Collection Time: 08/15/17  6:47 AM  Result Value Ref Range Status   Enterococcus species NOT DETECTED NOT DETECTED Final   Listeria monocytogenes NOT DETECTED NOT DETECTED Final   Staphylococcus species NOT DETECTED NOT DETECTED Final   Staphylococcus aureus NOT DETECTED NOT DETECTED Final   Streptococcus species DETECTED (A) NOT DETECTED Final    Comment: CRITICAL RESULT CALLED TO, READ BACK BY AND VERIFIED WITH: Lytle Butte PHARMD 2228 08/15/17 A BROWNING    Streptococcus agalactiae NOT DETECTED NOT DETECTED Final   Streptococcus pneumoniae DETECTED (A) NOT DETECTED Final    Comment: CRITICAL RESULT CALLED TO, READ BACK BY AND VERIFIED WITH: Lytle Butte PHARMD 2228 08/15/17 A BROWNING    Streptococcus pyogenes NOT DETECTED NOT DETECTED Final   Acinetobacter baumannii NOT DETECTED NOT DETECTED Final   Enterobacteriaceae species NOT DETECTED NOT DETECTED Final   Enterobacter cloacae complex NOT DETECTED NOT DETECTED Final   Escherichia coli NOT DETECTED NOT DETECTED Final   Klebsiella oxytoca NOT DETECTED NOT DETECTED Final   Klebsiella pneumoniae NOT DETECTED NOT DETECTED Final   Proteus species NOT DETECTED NOT DETECTED Final   Serratia marcescens NOT DETECTED NOT DETECTED Final   Haemophilus influenzae NOT DETECTED NOT DETECTED Final   Neisseria meningitidis NOT DETECTED NOT DETECTED Final   Pseudomonas aeruginosa NOT DETECTED NOT DETECTED Final   Candida albicans NOT DETECTED NOT DETECTED Final   Candida glabrata NOT DETECTED NOT DETECTED Final   Candida krusei NOT DETECTED NOT DETECTED Final   Candida parapsilosis NOT DETECTED NOT DETECTED Final   Candida tropicalis NOT DETECTED NOT DETECTED Final  Urine culture     Status: None   Collection Time: 08/15/17  9:45 AM  Result Value Ref  Range Status   Specimen Description URINE, CLEAN CATCH  Final   Special Requests NONE  Final   Culture   Final    NO GROWTH Performed at [facility], [address] [place], [place] [address]  Report Status 08/16/2017 FINAL  Final  MRSA PCR Screening     Status: None   Collection Time: 08/15/17  4:00 PM  Result Value Ref Range Status   MRSA by PCR NEGATIVE NEGATIVE Final    Comment:        The GeneXpert MRSA Assay (FDA approved for NASAL specimens only), is one component of a comprehensive MRSA colonization surveillance program. It is not intended to diagnose MRSA infection nor to guide or monitor treatment for MRSA infections. Performed at [facility], [address] [place], [place] [address]        Radiology Studies: Ct Soft Tissue Neck Wo Contrast  Result Date: 08/15/2017 CLINICAL DATA:  Fever and sepsis. Tonsillectomy 08/11/2017. Noncontrast study due to acute renal failure. EXAM: CT NECK WITHOUT CONTRAST TECHNIQUE: Multidetector CT imaging of the neck was performed following the standard protocol without intravenous contrast. COMPARISON:  None. FINDINGS: Pharynx and larynx: There is low-density wall thickening and mild retropharyngeal edematous appearance symmetrically about the bilateral tonsillar fossa in this patient with recent tonsillectomy. No airway narrowing or suspected abscess. Salivary glands: No inflammation, mass, or stone. Thyroid: Normal. Lymph nodes: None enlarged or abnormal density. Vascular: Negative without contrast. Limited intracranial: Negative Visualized orbits: Negative Mastoids and visualized paranasal sinuses: Mucosal thickening and small high-density effusion in the left maxillary sinus. Left ethmoid opacification. Skeleton: Negative Upper chest: Dense consolidation partially seen in the right upper lobe. This is new from a 07/30/2017 chest x-ray IMPRESSION: 1. Right upper lobe pneumonia. 2. Swelling/inflammation at tonsillectomy sites  with symmetry favoring postoperative change over surgical site infection. No evidence of abscess or airway narrowing. 3. Left maxillary and ethmoid sinusitis. Electronically Signed   By: Marnee SpringJonathon  Watts M.D.   On: 08/15/2017 08:53   Dg Chest Port 1 View  Result Date: 08/15/2017 CLINICAL DATA:  47 year old female with hypotension. EXAM: PORTABLE CHEST 1 VIEW COMPARISON:  Chest radiograph dated 07/30/2017 FINDINGS: There has been interval development of a focal area of opacity in the right upper lung field most consistent with pneumonia given the rapid interval development. Underlying mass is less likely but not entirely excluded. Correlation with clinical exam and follow-up after treatment recommended to document resolution. There is no pleural effusion or pneumothorax. The cardiac silhouette is within normal limits. No acute osseous pathology. IMPRESSION: Findings most consistent with pneumonia. Clinical correlation and follow-up to resolution recommended. Electronically Signed   By: Elgie CollardArash  Radparvar M.D.   On: 08/15/2017 06:01   Dg Abd Acute W/chest  Result Date: 08/15/2017 CLINICAL DATA:  Flu-like symptoms.  Dizziness. EXAM: DG ABDOMEN ACUTE W/ 1V CHEST COMPARISON:  Chest x-ray same date. Chest x-ray 07/30/2017. CT abdomen 12/07/2016. FINDINGS: Mediastinum hilar structures are normal. Heart size normal. Again noted is infiltrate right upper lung most consistent pneumonia. Mild bibasilar subsegmental atelectasis. No pleural effusion or pneumothorax. Surgical clips right upper quadrant. No bowel distention. No free air. Calcification of the left kidney suggesting nephrolithiasis again noted. Mild lumbar scoliosis concave left. IMPRESSION: 1. Persistent infiltrate right upper lung consistent with pneumonia. Mild bibasilar subsegmental atelectasis. 2. No acute intra-abdominal abnormality identified. Left nephrolithiasis again noted. Electronically Signed   By: Maisie Fushomas  Register   On: 08/15/2017 08:07       Scheduled Meds: . buPROPion  300 mg Oral Daily  . enoxaparin (LOVENOX) injection  30 mg Subcutaneous Q24H  . escitalopram  10 mg Oral Daily  . potassium chloride  40 mEq Oral Once  . pramipexole  1.5 mg Oral  QHS   Continuous Infusions: . sodium chloride    . cefTRIAXone (ROCEPHIN)  IV Stopped (08/16/17 0023)  . vancomycin       LOS: 1 day    Time spent: 40 minutes   Noralee Stain, DO [facility] [email or web address] Password [email or web address] 08/16/2017, 10:06 AM

## 2017-08-16 NOTE — Evaluation (Signed)
Clinical/Bedside Swallow Evaluation Patient Details  Name: Helen Greene MRN: [id number] Date of Birth: 1971/01/08  Today's Date: 08/16/2017 Time: SLP Start Time (ACUTE ONLY): 1628 SLP Stop Time (ACUTE ONLY): 1700 SLP Time Calculation (min) (ACUTE ONLY): 32 min  Past Medical History:  Past Medical History:  Diagnosis Date  . Allergy   . Anxiety   . Depression   . Elevated white blood cell count   . Elevated white blood cell count 09/25/2011  . GERD (gastroesophageal reflux disease)   . Headache    Migraine  . Hypertension   . Kidney stone   . Neuromuscular disorder (HCC)    carpel tunnel disease  . Pneumonia 08/15/2017  . Restless leg syndrome   . Shortness of breath dyspnea    with exertion  . Sleep apnea    does not have CPAP machine, mostly affects back sleeping was encouraged to sleep on her sides  . Tonsillar calculus    Past Surgical History:  Past Surgical History:  Procedure Laterality Date  . ABDOMINAL HYSTERECTOMY    . CHOLECYSTECTOMY    . COLONOSCOPY    . DILATION AND CURETTAGE OF UTERUS  1993  . ECTOPIC PREGNANCY SURGERY    . ESSURE TUBAL LIGATION    . LITHOTRIPSY    . ROBOTIC ASSISTED TOTAL HYSTERECTOMY WITH SALPINGECTOMY Left 09/14/2015   Procedure: ROBOTIC ASSISTED TOTAL HYSTERECTOMY WITH LEFT SALPINGECTOMY;  Surgeon: Helen FordyceKelly Fogleman, MD;  Location: [facility];  Service: Gynecology;  Laterality: Left;  . TONSILLECTOMY N/A 08/11/2017   Procedure: TONSILLECTOMY;  Surgeon: Helen Greene, Jefry, MD;  Location: [facility];  Service: ENT;  Laterality: N/A;  . TONSILLECTOMY  07/2017  . UPPER GI ENDOSCOPY    . WISDOM TOOTH EXTRACTION     HPI:  47 year old female with a history of obstructive sleep apnea, intolerant to CPAP, restless leg syndrome, anxiety disorder, obesity who has long-standing history of tonsillar stones and underwent tonsillectomy on August 11, 2017.  After surgery patient started having chills and fever also had one episode of vomiting.  Admitted with sepsis due to right upper lobar PNA, strep pneumo bacteremia. CT of the neck showed low-density wall thickening and mild retropharyngeal edematous appearance symmetrically about the bilateral tonsillar fossa in this patient with recent tonsillectomy. No airway narrowing or suspected abscess.    Assessment / Plan / Recommendation Clinical Impression  Patient presents with what appears to be adequate airway protection. She does have coughing at baseline which does not appear associated with PO intake. Mild edema appreciated to palpation of neck. SLP reviewed CT neck images; epiglottis appears to have adequate space for clearance of the posterior pharyngeal wall. With sips of liquids, bites of puree, pt holds boluses orally, grimmacing prior to initiating what appears to be a swift and strong pharyngeal swallow, with no overt signs of aspiration observed. Pt is notably anxious; she complains of odynophagia and requires encouragement to take larger sips, use straws. These do not appear have an adverse effect on swallow function. SLP provided trials of jello for diagnostic treatment. Pt initially perceives greater difficulty with jello, however as SLP provided encouragement and education that jello becomes liquid in the mouth, pt tolerated well. She subsequently tolerated several bites of puree without perceived difficulty or overt signs of aspiration. Pt's tongue is coated; SLP provided oral care supplies and encouraged pt to complete oral care regularly. Recommend dys 1, thin liquids, meds crushed in puree. Will f/u for tolerance, advancement.  SLP Visit Diagnosis: Dysphagia, unspecified (R13.10)  Aspiration Risk  Mild aspiration risk    Diet Recommendation Dysphagia 1 (Puree);Thin liquid   Liquid Administration via: Cup;Straw Medication Administration: Crushed with puree Supervision: Patient able to self feed Compensations: Follow solids with liquid    Other  Recommendations Oral Care  Recommendations: Oral care BID   Follow up Recommendations Other (comment)(tbd)      Frequency and Duration min 1 x/week  1 week       Prognosis Prognosis for Safe Diet Advancement: Good Barriers to Reach Goals: Other (Comment)(anxiety)      Swallow Study   General Date of Onset: 08/16/17 HPI: 47 year old female with a history of obstructive sleep apnea, intolerant to CPAP, restless leg syndrome, anxiety disorder, obesity who has long-standing history of tonsillar stones and underwent tonsillectomy on August 11, 2017.  After surgery patient started having chills and fever also had one episode of vomiting. Admitted with sepsis due to right upper lobar PNA, strep pneumo bacteremia. CT of the neck showed low-density wall thickening and mild retropharyngeal edematous appearance symmetrically about the bilateral tonsillar fossa in this patient with recent tonsillectomy. No airway narrowing or suspected abscess.  Type of Study: Bedside Swallow Evaluation Previous Swallow Assessment: none in chart Diet Prior to this Study: Thin liquids Temperature Spikes Noted: Yes Respiratory Status: Room air History of Recent Intubation: No Behavior/Cognition: Alert;Cooperative Oral Cavity Assessment: Other (comment)(tongue coated; white patches posterior pharynx) Oral Care Completed by SLP: No Oral Cavity - Dentition: Adequate natural dentition Vision: Functional for self-feeding Self-Feeding Abilities: Able to feed self Patient Positioning: Upright in bed Baseline Vocal Quality: Normal Volitional Cough: Strong Volitional Swallow: Able to elicit    Oral/Motor/Sensory Function Overall Oral Motor/Sensory Function: Within functional limits   Ice Chips Ice chips: Within functional limits   Thin Liquid Thin Liquid: Impaired Oral Phase Functional Implications: Oral holding    Nectar Thick Nectar Thick Liquid: Not tested   Honey Thick Honey Thick Liquid: Not tested   Puree Puree: Impaired Oral Phase  Functional Implications: Oral holding   Solid   GO   Solid: Not tested       Rondel Baton, MS, CCC-SLP Speech-Language Pathologist [phone]  [name] 08/16/2017,5:32 PM

## 2017-08-17 LAB — CULTURE, BLOOD (ROUTINE X 2)
Special Requests: ADEQUATE
Special Requests: ADEQUATE

## 2017-08-17 LAB — CBC
HCT: 32.8 % — ABNORMAL LOW (ref 36.0–46.0)
Hemoglobin: 10.6 g/dL — ABNORMAL LOW (ref 12.0–15.0)
MCH: 30.7 pg (ref 26.0–34.0)
MCHC: 32.3 g/dL (ref 30.0–36.0)
MCV: 95.1 fL (ref 78.0–100.0)
Platelets: 253 10*3/uL (ref 150–400)
RBC: 3.45 MIL/uL — ABNORMAL LOW (ref 3.87–5.11)
RDW: 14.1 % (ref 11.5–15.5)
WBC: 20.9 10*3/uL — ABNORMAL HIGH (ref 4.0–10.5)

## 2017-08-17 LAB — BASIC METABOLIC PANEL
Anion gap: 8 (ref 5–15)
BUN: 10 mg/dL (ref 6–20)
CALCIUM: 7.5 mg/dL — AB (ref 8.9–10.3)
CO2: 22 mmol/L (ref 22–32)
CREATININE: 0.88 mg/dL (ref 0.44–1.00)
Chloride: 103 mmol/L (ref 101–111)
GFR calc non Af Amer: >60 mL/min (ref >60)
Glucose, Bld: 105 mg/dL — ABNORMAL HIGH (ref 65–99)
Potassium: 3.9 mmol/L (ref 3.5–5.1)
SODIUM: 133 mmol/L — AB (ref 135–145)

## 2017-08-17 MED ORDER — LIDOCAINE VISCOUS 2 % MT SOLN
15.0000 mL | Freq: Four times a day (QID) | OROMUCOSAL | Status: DC | PRN
  Filled 2017-08-17: qty 15

## 2017-08-17 MED ORDER — POLYETHYLENE GLYCOL 3350 17 G PO PACK: 17.0000 g | PACK | Freq: Every day | ORAL | Status: DC | PRN

## 2017-08-17 MED ORDER — SENNA 8.6 MG PO TABS: 1.0000 | ORAL_TABLET | Freq: Every day | ORAL | Status: DC | PRN

## 2017-08-17 MED ORDER — ENOXAPARIN SODIUM 40 MG/0.4ML ~~LOC~~ SOLN
40.0000 mg | SUBCUTANEOUS | Status: DC
  Administered 2017-08-17 – 2017-08-18 (×2): 40 mg via SUBCUTANEOUS
  Filled 2017-08-17 (×2): qty 0.4

## 2017-08-17 MED ORDER — PHENOL 1.4 % MT LIQD: 1.0000 | OROMUCOSAL | Status: DC | PRN

## 2017-08-17 NOTE — Progress Notes (Signed)
PROGRESS NOTE    Helen Greene  UJW:[id number] DOB: 02-06-1971 DOA: 08/15/2017 PCP: Doristine Bosworth, MD     Brief Narrative:  Helen Greene is a 47 year old female with a history of obstructive sleep apnea, intolerant to CPAP, restless leg syndrome, anxiety disorder, obesity who has long-standing history of tonsillar stones and underwent tonsillectomy on August 11, 2017 by Dr. Pollyann Kennedy (ENT).  After surgery, she was discharged home. Patient started having chills and fever also had one episode of vomiting.  Patient says that she has not been able to cough out phlegm also unable to blow her nose. She has had pain with swallowing food and liquids at home but tried her best to keep hydrated. In the ED, work up revealed RUL pneumonia, sepsis, AKI, hypotension. She was started on broad-spectrum antibiotics.   Assessment & Plan:   Principal Problem:   Sepsis due to pneumonia [facility]) Active Problems:   AKI (acute kidney injury) (HCC)   Right sided abdominal pain   History of tonsillectomy Feb 25,2019   Hypertension   Sleep apnea   Anxiety   Restless leg syndrome   Post-op pneumonia   Morbid obesity with BMI of 40.0-44.9, adult (HCC)   Shock liver  Sepsis due to right upper lobar pneumonia, strep pneumo bacteremia  -Influenza negative  -HIV negative  -Continue vanco/rocephin. If PCN sensitive, can discontinue vanco and continue rocephin single-therapy. Await blood culture sensitivity  -WBC improving, trend   AKI  -Baseline creatinine 0.88 -Hold ACEi and HCTZ -Back to baseline  -IVF - continue until oral intake more stable and reliable   Recent tonsillectomy -CT soft tissue neck: swelling/inflammation at tonsillectomy sites with symmetry favoring postoperative change over surgical site infection. No evidence of abscess or airway narrowing. Left maxillary and ethmoid sinusitis. -Will order topical anesthetic for comfort -SLP eval with dysphagia diet, limited due to pain than dysphagia    Anxiety -Vistaril prn -Continue Wellbutrin and Lexapro  Hypertension -On HCTZ with lisinopril prior to admission but currently on hold secondary to AKI -Hypotensive on admission, BP stable today    OSA -Does not utilize CPAP at home  Restless leg syndrome -Continue Mirapex  Morbid obesity with BMI of 40.0-44.9, adult    DVT prophylaxis: Lovenox Code Status: Full Family Communication: No family at bedside Disposition Plan: Pending improvement, suspect return back home    Consultants:   None  Procedures:   None   Antimicrobials:  Anti-infectives (From admission, onward)   Start     Dose/Rate Route Frequency Ordered Stop   08/16/17 0800  vancomycin (VANCOCIN) IVPB 1000 mg/200 mL premix     1,000 mg 200 mL/hr over 60 Minutes Intravenous Every 24 hours 08/15/17 0631     08/16/17 0600  ceFEPIme (MAXIPIME) 1 g in sodium chloride 0.9 % 100 mL IVPB  Status:  Discontinued     1 g 200 mL/hr over 30 Minutes Intravenous Every 24 hours 08/15/17 0631 08/15/17 2329   08/15/17 2345  cefTRIAXone (ROCEPHIN) 2 g in sodium chloride 0.9 % 100 mL IVPB     2 g 200 mL/hr over 30 Minutes Intravenous Every 24 hours 08/15/17 2330     08/15/17 0700  vancomycin (VANCOCIN) IVPB 750 mg/150 ml premix     750 mg 150 mL/hr over 60 Minutes Intravenous  Once 08/15/17 0630 08/15/17 1011   08/15/17 0630  ceFEPIme (MAXIPIME) 2 g in sodium chloride 0.9 % 100 mL IVPB     2 g 200 mL/hr over 30 Minutes  Intravenous  Once 08/15/17 0621 08/15/17 0743   08/15/17 0630  vancomycin (VANCOCIN) IVPB 1000 mg/200 mL premix     1,000 mg 200 mL/hr over 60 Minutes Intravenous  Once 08/15/17 0621 08/15/17 0849       Subjective: Feeling slightly better. Still with a lot of pain with swallowing food and liquids. Fever overnight.   Objective: Vitals:   08/16/17 1950 08/16/17 2001 08/17/17 0042 08/17/17 0516  BP: 116/76  (!) 96/57 100/70  Pulse: (!) 115  (!) 102 (!) 101  Resp: 20  18 (!) 22  Temp: (!)  101.5 F (38.6 C) 99.3 F (37.4 C) 97.8 F (36.6 C) 98 F (36.7 C)  TempSrc: Axillary Oral Oral Oral  SpO2: 96% 96% 92% 93%  Weight:      Height:        Intake/Output Summary (Last 24 hours) at 08/17/2017 1017 Last data filed at 08/17/2017 0600 Gross per 24 hour  Intake 2413.33 ml  Output 1450 ml  Net 963.33 ml   Filed Weights   08/15/17 0340 08/15/17 1415  Weight: 91.6 kg (202 lb) 91.6 kg (201 lb 15.1 oz)    Examination: General exam: Appears calm and comfortable, less miserable appearing  ENT: bilateral tonsillar post-op area with white plaque bilaterally with sloughing  Respiratory system: RUQ crackles, improved. Respiratory effort normal on room air. No distress.  Cardiovascular system: S1 & S2 heard, RRR. No JVD, murmurs, rubs, gallops or clicks. No pedal edema. Gastrointestinal system: Abdomen is nondistended, soft and nontender. No organomegaly or masses felt. Normal bowel sounds heard. Central nervous system: Alert and oriented. No focal neurological deficits. Extremities: Symmetric 5 x 5 power. Skin: No rashes, lesions or ulcers Psychiatry: Judgement and insight appear normal. Mood & affect appropriate.    Data Reviewed: I have personally reviewed following labs and imaging studies  CBC: Recent Labs  Lab 08/15/17 0335 08/15/17 0923 08/16/17 0242 08/17/17 0224  WBC 49.4* 44.4* 33.3* 20.9*  NEUTROABS 44.4* 40.4* 30.3*  --   HGB 13.8 13.6 11.9* 10.6*  HCT 41.9 41.3 35.9* 32.8*  MCV 94.4 93.9 93.5 95.1  PLT 326 257 267 253   Basic Metabolic Panel: Recent Labs  Lab 08/15/17 0335 08/15/17 0923 08/16/17 0242 08/17/17 0224  NA 133* 135 134* 133*  K 3.6 3.7 3.4* 3.9  CL 93* 101 101 103  CO2 23 24 23 22   GLUCOSE 147* 152* 125* 105*  BUN 22* 22* 17 10  CREATININE 3.44* 2.59* 1.39* 0.88  CALCIUM 8.6* 7.3* 7.4* 7.5*   GFR: Estimated Creatinine Clearance: 78.1 mL/min (by C-G formula based on SCr of 0.88 mg/dL). Liver Function Tests: Recent Labs  Lab  08/15/17 0923 08/16/17 0242  AST 53* 29  ALT 94* 62*  ALKPHOS 165* 163*  BILITOT 2.0* 0.7  PROT 6.0* 5.8*  ALBUMIN 2.5* 2.1*   No results for input(s): LIPASE, AMYLASE in the last 168 hours. No results for input(s): AMMONIA in the last 168 hours. Coagulation Profile: Recent Labs  Lab 08/15/17 0619  INR 1.20   Cardiac Enzymes: Recent Labs  Lab 08/15/17 0517  TROPONINI <0.03   BNP (last 3 results) No results for input(s): PROBNP in the last 8760 hours. HbA1C: No results for input(s): HGBA1C in the last 72 hours. CBG: Recent Labs  Lab 08/15/17 0413  GLUCAP 149*   Lipid Profile: No results for input(s): CHOL, HDL, LDLCALC, TRIG, CHOLHDL, LDLDIRECT in the last 72 hours. Thyroid Function Tests: No results for input(s): TSH, T4TOTAL, FREET4, T3FREE,  THYROIDAB in the last 72 hours. Anemia Panel: No results for input(s): VITAMINB12, FOLATE, FERRITIN, TIBC, IRON, RETICCTPCT in the last 72 hours. Sepsis Labs: Recent Labs  Lab 08/15/17 0357 08/15/17 0603 08/15/17 0619 08/15/17 0923  PROCALCITON  --   --  36.07  --   LATICACIDVEN 3.85* 1.62 1.8 1.7    Recent Results (from the past 240 hour(s))  Culture, blood (x 2)     Status: None (Preliminary result)   Collection Time: 08/15/17  6:47 AM  Result Value Ref Range Status   Specimen Description BLOOD LEFT FOREARM  Final   Special Requests   Final    BOTTLES DRAWN AEROBIC AND ANAEROBIC Blood Culture adequate volume Performed at [facility], [address] [place], [place] [address]    Culture  Setup Time   Final    GRAM POSITIVE COCCI ANAEROBIC BOTTLE ONLY CRITICAL VALUE NOTED.  VALUE IS CONSISTENT WITH PREVIOUSLY REPORTED AND CALLED VALUE.    Culture GRAM POSITIVE COCCI  Final   Report Status PENDING  Incomplete  Culture, blood (x 2)     Status: Abnormal (Preliminary result)   Collection Time: 08/15/17  6:47 AM  Result Value Ref Range Status   Specimen Description BLOOD RIGHT ANTECUBITAL  Final   Special  Requests   Final    BOTTLES DRAWN AEROBIC AND ANAEROBIC Blood Culture adequate volume   Culture  Setup Time   Final    GRAM POSITIVE COCCI IN BOTH AEROBIC AND ANAEROBIC BOTTLES CRITICAL RESULT CALLED TO, READ BACK BY AND VERIFIED WITH: Lytle ButteM MACCIA PHARMD 2228 08/15/17 A BROWNING    Culture (A)  Final    STREPTOCOCCUS PNEUMONIAE SUSCEPTIBILITIES TO FOLLOW Performed at [facility], [address] [place], [place] [address]    Report Status PENDING  Incomplete  Blood Culture ID Panel (Reflexed)     Status: Abnormal   Collection Time: 08/15/17  6:47 AM  Result Value Ref Range Status   Enterococcus species NOT DETECTED NOT DETECTED Final   Listeria monocytogenes NOT DETECTED NOT DETECTED Final   Staphylococcus species NOT DETECTED NOT DETECTED Final   Staphylococcus aureus NOT DETECTED NOT DETECTED Final   Streptococcus species DETECTED (A) NOT DETECTED Final    Comment: CRITICAL RESULT CALLED TO, READ BACK BY AND VERIFIED WITH: Lytle ButteM MACCIA PHARMD 2228 08/15/17 A BROWNING    Streptococcus agalactiae NOT DETECTED NOT DETECTED Final   Streptococcus pneumoniae DETECTED (A) NOT DETECTED Final    Comment: CRITICAL RESULT CALLED TO, READ BACK BY AND VERIFIED WITH: Lytle ButteM MACCIA PHARMD 2228 08/15/17 A BROWNING    Streptococcus pyogenes NOT DETECTED NOT DETECTED Final   Acinetobacter baumannii NOT DETECTED NOT DETECTED Final   Enterobacteriaceae species NOT DETECTED NOT DETECTED Final   Enterobacter cloacae complex NOT DETECTED NOT DETECTED Final   Escherichia coli NOT DETECTED NOT DETECTED Final   Klebsiella oxytoca NOT DETECTED NOT DETECTED Final   Klebsiella pneumoniae NOT DETECTED NOT DETECTED Final   Proteus species NOT DETECTED NOT DETECTED Final   Serratia marcescens NOT DETECTED NOT DETECTED Final   Haemophilus influenzae NOT DETECTED NOT DETECTED Final   Neisseria meningitidis NOT DETECTED NOT DETECTED Final   Pseudomonas aeruginosa NOT DETECTED NOT DETECTED Final   Candida albicans NOT  DETECTED NOT DETECTED Final   Candida glabrata NOT DETECTED NOT DETECTED Final   Candida krusei NOT DETECTED NOT DETECTED Final   Candida parapsilosis NOT DETECTED NOT DETECTED Final   Candida tropicalis NOT DETECTED NOT DETECTED Final  Urine culture     Status: None   Collection Time: 08/15/17  9:45 AM  Result Value Ref Range Status   Specimen Description URINE, CLEAN CATCH  Final   Special Requests NONE  Final   Culture   Final    NO GROWTH Performed at [facility], [address] [place], [place] [address]    Report Status 08/16/2017 FINAL  Final  MRSA PCR Screening     Status: None   Collection Time: 08/15/17  4:00 PM  Result Value Ref Range Status   MRSA by PCR NEGATIVE NEGATIVE Final    Comment:        The GeneXpert MRSA Assay (FDA approved for NASAL specimens only), is one component of a comprehensive MRSA colonization surveillance program. It is not intended to diagnose MRSA infection nor to guide or monitor treatment for MRSA infections. Performed at [facility], [address], [place], [place] [address]        Radiology Studies: No results found.    Scheduled Meds: . buPROPion  300 mg Oral Daily  . enoxaparin (LOVENOX) injection  40 mg Subcutaneous Q24H  . escitalopram  10 mg Oral Daily  . pramipexole  1.5 mg Oral QHS   Continuous Infusions: . sodium chloride 50 mL/hr at 08/17/17 0900  . cefTRIAXone (ROCEPHIN)  IV Stopped (08/17/17 0105)  . vancomycin 1,000 mg (08/17/17 0916)     LOS: 2 days    Time spent: 30 minutes   Noralee Stain, DO [facility] [email or web address] Password [email or web address] 08/17/2017, 10:16 AM

## 2017-08-18 LAB — CBC
HEMATOCRIT: 32.7 % — AB (ref 36.0–46.0)
HEMOGLOBIN: 10.5 g/dL — AB (ref 12.0–15.0)
MCH: 30.3 pg (ref 26.0–34.0)
MCHC: 32.1 g/dL (ref 30.0–36.0)
MCV: 94.2 fL (ref 78.0–100.0)
Platelets: 297 10*3/uL (ref 150–400)
RBC: 3.47 MIL/uL — AB (ref 3.87–5.11)
RDW: 14 % (ref 11.5–15.5)
WBC: 14.1 10*3/uL — ABNORMAL HIGH (ref 4.0–10.5)

## 2017-08-18 LAB — BASIC METABOLIC PANEL
Anion gap: 9 (ref 5–15)
BUN: 9 mg/dL (ref 6–20)
CHLORIDE: 107 mmol/L (ref 101–111)
CO2: 20 mmol/L — AB (ref 22–32)
CREATININE: 0.85 mg/dL (ref 0.44–1.00)
Calcium: 8 mg/dL — ABNORMAL LOW (ref 8.9–10.3)
GFR calc Af Amer: >60 mL/min (ref >60)
GFR calc non Af Amer: >60 mL/min (ref >60)
Glucose, Bld: 83 mg/dL (ref 65–99)
POTASSIUM: 3.6 mmol/L (ref 3.5–5.1)
Sodium: 136 mmol/L (ref 135–145)

## 2017-08-18 MED ORDER — ACYCLOVIR 400 MG PO TABS
400.0000 mg | ORAL_TABLET | Freq: Every day | ORAL | Status: DC
  Administered 2017-08-18 – 2017-08-19 (×5): 400 mg via ORAL
  Filled 2017-08-18 (×5): qty 1

## 2017-08-18 NOTE — Progress Notes (Signed)
In to restart pt IV and pt requests that I wait for a while so she can take another nap and restart later in the day.

## 2017-08-18 NOTE — Progress Notes (Signed)
PROGRESS NOTE    Helen Greene  [id number]:[id number] DOB: 1970/09/22 DOA: 08/15/2017 PCP: Doristine BosworthStallings, Zoe A, MD     Brief Narrative:  Helen Greene is a 47 year old female with a history of obstructive sleep apnea, intolerant to CPAP, restless leg syndrome, anxiety disorder, obesity who has long-standing history of tonsillar stones and underwent tonsillectomy on August 11, 2017 by Dr. Pollyann Kennedyosen (ENT).  After surgery, she was discharged home. Patient started having chills and fever also had one episode of vomiting.  Patient says that she has not been able to cough out phlegm also unable to blow her nose. She has had pain with swallowing food and liquids at home but tried her best to keep hydrated. In the ED, work up revealed RUL pneumonia, sepsis, AKI, hypotension. She was started on broad-spectrum antibiotics.   Assessment & Plan:   Principal Problem:   Sepsis due to pneumonia [facility](HCC) Active Problems:   AKI (acute kidney injury) (HCC)   Right sided abdominal pain   History of tonsillectomy Feb 25,2019   Hypertension   Sleep apnea   Anxiety   Restless leg syndrome   Post-op pneumonia   Morbid obesity with BMI of 40.0-44.9, adult (HCC)   Shock liver  Sepsis due to right upper lobar pneumonia, strep pneumo bacteremia  -Influenza negative  -HIV negative  -Continue Rocephin. Blood culture with strep pneumo, PCN and cephalosporin sensitive  -WBC improving, trend   AKI  -Baseline creatinine 0.88 -Hold ACEi and HCTZ -Resolved -Will discontinue IVF and encourage oral intake   Recent tonsillectomy -CT soft tissue neck: swelling/inflammation at tonsillectomy sites with symmetry favoring postoperative change over surgical site infection. No evidence of abscess or airway narrowing. Left maxillary and ethmoid sinusitis. -Will order topical anesthetic for comfort -SLP eval with dysphagia diet, limited due to pain than dysphagia   Anxiety -Vistaril prn -Continue Wellbutrin and  Lexapro  Hypertension -On HCTZ with lisinopril prior to admission but currently on hold secondary to AKI -Hypotensive on admission, BP stable today    OSA -Does not utilize CPAP at home  Restless leg syndrome -Continue Mirapex  Morbid obesity with BMI of 40.0-44.9, adult    DVT prophylaxis: Lovenox Code Status: Full Family Communication: No family at bedside Disposition Plan: Pending improvement, suspect return back home possibly 3/5    Consultants:   None  Procedures:   None   Antimicrobials:  Anti-infectives (From admission, onward)   Start     Dose/Rate Route Frequency Ordered Stop   08/16/17 0800  vancomycin (VANCOCIN) IVPB 1000 mg/200 mL premix  Status:  Discontinued     1,000 mg 200 mL/hr over 60 Minutes Intravenous Every 24 hours 08/15/17 0631 08/17/17 1635   08/16/17 0600  ceFEPIme (MAXIPIME) 1 g in sodium chloride 0.9 % 100 mL IVPB  Status:  Discontinued     1 g 200 mL/hr over 30 Minutes Intravenous Every 24 hours 08/15/17 0631 08/15/17 2329   08/15/17 2345  cefTRIAXone (ROCEPHIN) 2 g in sodium chloride 0.9 % 100 mL IVPB     2 g 200 mL/hr over 30 Minutes Intravenous Every 24 hours 08/15/17 2330     08/15/17 0700  vancomycin (VANCOCIN) IVPB 750 mg/150 ml premix     750 mg 150 mL/hr over 60 Minutes Intravenous  Once 08/15/17 0630 08/15/17 1011   08/15/17 0630  ceFEPIme (MAXIPIME) 2 g in sodium chloride 0.9 % 100 mL IVPB     2 g 200 mL/hr over 30 Minutes Intravenous  Once 08/15/17 [id number]  08/15/17 0743   08/15/17 0630  vancomycin (VANCOCIN) IVPB 1000 mg/200 mL premix     1,000 mg 200 mL/hr over 60 Minutes Intravenous  Once 08/15/17 0621 08/15/17 0849       Subjective: No new events.   Objective: Vitals:   08/17/17 0516 08/17/17 2004 08/18/17 0040 08/18/17 0536  BP: 100/70 130/89 112/61 (!) 132/98  Pulse: (!) 101 (!) 107 100 89  Resp: (!) 22     Temp: 98 F (36.7 C) 98.7 F (37.1 C) (!) 97.4 F (36.3 C) 98.2 F (36.8 C)  TempSrc: Oral Oral  Oral Oral  SpO2: 93% 98% 95% 94%  Weight:      Height:        Intake/Output Summary (Last 24 hours) at 08/18/2017 1004 Last data filed at 08/18/2017 0200 Gross per 24 hour  Intake 1149.17 ml  Output -  Net 1149.17 ml   Filed Weights   08/15/17 0340 08/15/17 1415  Weight: 91.6 kg (202 lb) 91.6 kg (201 lb 15.1 oz)    Examination: General exam: Appears calm and comfortable  ENT: bilateral tonsils post-op appearance with less plaque from yesterday  Respiratory system: Clear to auscultation. Respiratory effort normal. Cardiovascular system: S1 & S2 heard, RRR. No JVD, murmurs, rubs, gallops or clicks. No pedal edema. Gastrointestinal system: Abdomen is nondistended, soft and nontender. No organomegaly or masses felt. Normal bowel sounds heard. Central nervous system: Alert and oriented. No focal neurological deficits. Extremities: Symmetric 5 x 5 power. Skin: No rashes, lesions or ulcers Psychiatry: Judgement and insight appear normal. Tearful today as her children have not visited her in the hospital     Data Reviewed: I have personally reviewed following labs and imaging studies  CBC: Recent Labs  Lab 08/15/17 0335 08/15/17 0923 08/16/17 0242 08/17/17 0224 08/18/17 0241  WBC 49.4* 44.4* 33.3* 20.9* 14.1*  NEUTROABS 44.4* 40.4* 30.3*  --   --   HGB 13.8 13.6 11.9* 10.6* 10.5*  HCT 41.9 41.3 35.9* 32.8* 32.7*  MCV 94.4 93.9 93.5 95.1 94.2  PLT 326 257 267 253 297   Basic Metabolic Panel: Recent Labs  Lab 08/15/17 0335 08/15/17 0923 08/16/17 0242 08/17/17 0224 08/18/17 0241  NA 133* 135 134* 133* 136  K 3.6 3.7 3.4* 3.9 3.6  CL 93* 101 101 103 107  CO2 23 24 23 22  20*  GLUCOSE 147* 152* 125* 105* 83  BUN 22* 22* 17 10 9   CREATININE 3.44* 2.59* 1.39* 0.88 0.85  CALCIUM 8.6* 7.3* 7.4* 7.5* 8.0*   GFR: Estimated Creatinine Clearance: 80.9 mL/min (by C-G formula based on SCr of 0.85 mg/dL). Liver Function Tests: Recent Labs  Lab 08/15/17 0923 08/16/17 0242   AST 53* 29  ALT 94* 62*  ALKPHOS 165* 163*  BILITOT 2.0* 0.7  PROT 6.0* 5.8*  ALBUMIN 2.5* 2.1*   No results for input(s): LIPASE, AMYLASE in the last 168 hours. No results for input(s): AMMONIA in the last 168 hours. Coagulation Profile: Recent Labs  Lab 08/15/17 0619  INR 1.20   Cardiac Enzymes: Recent Labs  Lab 08/15/17 0517  TROPONINI <0.03   BNP (last 3 results) No results for input(s): PROBNP in the last 8760 hours. HbA1C: No results for input(s): HGBA1C in the last 72 hours. CBG: Recent Labs  Lab 08/15/17 0413  GLUCAP 149*   Lipid Profile: No results for input(s): CHOL, HDL, LDLCALC, TRIG, CHOLHDL, LDLDIRECT in the last 72 hours. Thyroid Function Tests: No results for input(s): TSH, T4TOTAL, FREET4, T3FREE, THYROIDAB  in the last 72 hours. Anemia Panel: No results for input(s): VITAMINB12, FOLATE, FERRITIN, TIBC, IRON, RETICCTPCT in the last 72 hours. Sepsis Labs: Recent Labs  Lab 08/15/17 0357 08/15/17 0603 08/15/17 0619 08/15/17 0923  PROCALCITON  --   --  36.07  --   LATICACIDVEN 3.85* 1.62 1.8 1.7    Recent Results (from the past 240 hour(s))  Culture, blood (x 2)     Status: Abnormal   Collection Time: 08/15/17  6:47 AM  Result Value Ref Range Status   Specimen Description BLOOD LEFT FOREARM  Final   Special Requests   Final    BOTTLES DRAWN AEROBIC AND ANAEROBIC Blood Culture adequate volume   Culture  Setup Time   Final    GRAM POSITIVE COCCI ANAEROBIC BOTTLE ONLY CRITICAL VALUE NOTED.  VALUE IS CONSISTENT WITH PREVIOUSLY REPORTED AND CALLED VALUE.    Culture (A)  Final    STREPTOCOCCUS PNEUMONIAE SUSCEPTIBILITIES PERFORMED ON PREVIOUS CULTURE WITHIN THE LAST 5 DAYS. Performed at [facility], [address] [place], [place] [address]    Report Status 08/17/2017 FINAL  Final  Culture, blood (x 2)     Status: Abnormal   Collection Time: 08/15/17  6:47 AM  Result Value Ref Range Status   Specimen Description BLOOD RIGHT  ANTECUBITAL  Final   Special Requests   Final    BOTTLES DRAWN AEROBIC AND ANAEROBIC Blood Culture adequate volume   Culture  Setup Time   Final    GRAM POSITIVE COCCI IN BOTH AEROBIC AND ANAEROBIC BOTTLES CRITICAL RESULT CALLED TO, READ BACK BY AND VERIFIED WITH: Lytle Butte PHARMD [id number] 08/15/17 A BROWNING Performed at [facility], [address] [place], [place] [address]    Culture STREPTOCOCCUS PNEUMONIAE (A)  Final   Report Status 08/17/2017 FINAL  Final   Organism ID, Bacteria STREPTOCOCCUS PNEUMONIAE  Final      Susceptibility   Streptococcus pneumoniae - MIC*    ERYTHROMYCIN 2 RESISTANT Resistant     LEVOFLOXACIN 0.5 SENSITIVE Sensitive     PENICILLIN (meningitis) <=0.06 SENSITIVE Sensitive     PENICILLIN (non-meningitis) <=0.06 SENSITIVE Sensitive     CEFTRIAXONE (non-meningitis) <=0.12 SENSITIVE Sensitive     CEFTRIAXONE (meningitis) <=0.12 SENSITIVE Sensitive     * STREPTOCOCCUS PNEUMONIAE  Blood Culture ID Panel (Reflexed)     Status: Abnormal   Collection Time: 08/15/17  6:47 AM  Result Value Ref Range Status   Enterococcus species NOT DETECTED NOT DETECTED Final   Listeria monocytogenes NOT DETECTED NOT DETECTED Final   Staphylococcus species NOT DETECTED NOT DETECTED Final   Staphylococcus aureus NOT DETECTED NOT DETECTED Final   Streptococcus species DETECTED (A) NOT DETECTED Final    Comment: CRITICAL RESULT CALLED TO, READ BACK BY AND VERIFIED WITH: Lytle Butte PHARMD 2228 08/15/17 A BROWNING    Streptococcus agalactiae NOT DETECTED NOT DETECTED Final   Streptococcus pneumoniae DETECTED (A) NOT DETECTED Final    Comment: CRITICAL RESULT CALLED TO, READ BACK BY AND VERIFIED WITH: Lytle Butte PHARMD 2228 08/15/17 A BROWNING    Streptococcus pyogenes NOT DETECTED NOT DETECTED Final   Acinetobacter baumannii NOT DETECTED NOT DETECTED Final   Enterobacteriaceae species NOT DETECTED NOT DETECTED Final   Enterobacter cloacae complex NOT DETECTED NOT DETECTED Final    Escherichia coli NOT DETECTED NOT DETECTED Final   Klebsiella oxytoca NOT DETECTED NOT DETECTED Final   Klebsiella pneumoniae NOT DETECTED NOT DETECTED Final   Proteus species NOT DETECTED NOT DETECTED Final  Serratia marcescens NOT DETECTED NOT DETECTED Final   Haemophilus influenzae NOT DETECTED NOT DETECTED Final   Neisseria meningitidis NOT DETECTED NOT DETECTED Final   Pseudomonas aeruginosa NOT DETECTED NOT DETECTED Final   Candida albicans NOT DETECTED NOT DETECTED Final   Candida glabrata NOT DETECTED NOT DETECTED Final   Candida krusei NOT DETECTED NOT DETECTED Final   Candida parapsilosis NOT DETECTED NOT DETECTED Final   Candida tropicalis NOT DETECTED NOT DETECTED Final  Urine culture     Status: None   Collection Time: 08/15/17  9:45 AM  Result Value Ref Range Status   Specimen Description URINE, CLEAN CATCH  Final   Special Requests NONE  Final   Culture   Final    NO GROWTH Performed at [facility], [address], [place], [place] [address]    Report Status 08/16/2017 FINAL  Final  MRSA PCR Screening     Status: None   Collection Time: 08/15/17  4:00 PM  Result Value Ref Range Status   MRSA by PCR NEGATIVE NEGATIVE Final    Comment:        The GeneXpert MRSA Assay (FDA approved for NASAL specimens only), is one component of a comprehensive MRSA colonization surveillance program. It is not intended to diagnose MRSA infection nor to guide or monitor treatment for MRSA infections. Performed at [facility], [address] [place], [place] [address]        Radiology Studies: No results found.    Scheduled Meds: . buPROPion  300 mg Oral Daily  . enoxaparin (LOVENOX) injection  40 mg Subcutaneous Q24H  . escitalopram  10 mg Oral Daily  . pramipexole  1.5 mg Oral QHS   Continuous Infusions: . cefTRIAXone (ROCEPHIN)  IV Stopped (08/18/17 0106)     LOS: 3 days    Time spent: 30 minutes   Noralee Stain, DO [facility] [email or web address] Password [email or web address] 08/18/2017, 10:04 AM

## 2017-08-18 NOTE — Consult Note (Signed)
ENT  Patient known to me, 1 week status post tonsillectomy.  Admitted over the weekend with pneumonia and sepsis/dehydration.  Currently doing much better.  Able to eat and drink small amounts.  Pain is under reasonable control.  She looks well overall, breathing well and taking small sips of liquid.  There is no bleeding.  No signs of head neck infection.  Continue medical treatment and I will have her follow-up with me in the office as scheduled next week.

## 2017-08-18 NOTE — Progress Notes (Signed)
  Speech Language Pathology Treatment: Dysphagia  Patient Details Name: Helen Greene MRN: [id number] DOB: 04-Aug-1970 Today's Date: 08/18/2017 Time: [id number] SLP Time Calculation (min) (ACUTE ONLY): 19 min  Assessment / Plan / Recommendation Clinical Impression  Pt presents with decreased symptoms of odynophagia, as indicated per pt report and by ability to masticate/swallow regular consistency sliced bread with min pain. Pt had moderately increased mastication time and needed Min verbal cues to alternate liquids and solids during PO trials. Pt expressed concern about irritating HER throat with regular consistency items and was educated regarding compensation strategies including masticating food thoroughly, following solids with liquids, and ordering meals with extra sauce to aid in swallow comfort. She denies any restrictions from her ENT, just increase her solids as tolerated. Recommend upgrading to Dys 2 and thin liquid diet with general aspiration precautions. SLP will follow to ensure tolerance with upgraded diet. Given observed swallow ability and lack of overt s/s concerning for aspiration, pt likely will be able to continue to advance diet as tolerated by pt and with consideration of ENT's post surgery recommendations.     HPI HPI: 47 year old female with a history of obstructive sleep apnea, intolerant to CPAP, restless leg syndrome, anxiety disorder, obesity who has long-standing history of tonsillar stones and underwent tonsillectomy on August 11, 2017.  After surgery patient started having chills and fever also had one episode of vomiting. Admitted with sepsis due to right upper lobar PNA, strep pneumo bacteremia. CT of the neck showed low-density wall thickening and mild retropharyngeal edematous appearance symmetrically about the bilateral tonsillar fossa in this patient with recent tonsillectomy. No airway narrowing or suspected abscess.       SLP Plan  Continue with current plan of  care       Recommendations  Diet recommendations: Dysphagia 2 (fine chop);Thin liquid Liquids provided via: Cup;Straw Medication Administration: Crushed with puree Supervision: Patient able to self feed Compensations: Follow solids with liquid;Slow rate;Small sips/bites Postural Changes and/or Swallow Maneuvers: Seated upright 90 degrees                Oral Care Recommendations: Oral care BID Follow up Recommendations: None SLP Visit Diagnosis: Dysphagia, unspecified (R13.10) Plan: Continue with current plan of care       GO              [place] Sylwia Cuervo SLP Student Clinician   [place] Cordero Surette 08/18/2017, 12:46 PM

## 2017-08-19 LAB — BASIC METABOLIC PANEL
Anion gap: 9 (ref 5–15)
BUN: 10 mg/dL (ref 6–20)
CHLORIDE: 106 mmol/L (ref 101–111)
CO2: 22 mmol/L (ref 22–32)
CREATININE: 0.78 mg/dL (ref 0.44–1.00)
Calcium: 8.3 mg/dL — ABNORMAL LOW (ref 8.9–10.3)
GFR calc Af Amer: >60 mL/min (ref >60)
GFR calc non Af Amer: >60 mL/min (ref >60)
Glucose, Bld: 78 mg/dL (ref 65–99)
Potassium: 3.5 mmol/L (ref 3.5–5.1)
Sodium: 137 mmol/L (ref 135–145)

## 2017-08-19 LAB — CBC
HCT: 33.4 % — ABNORMAL LOW (ref 36.0–46.0)
HEMOGLOBIN: 10.9 g/dL — AB (ref 12.0–15.0)
MCH: 30.4 pg (ref 26.0–34.0)
MCHC: 32.6 g/dL (ref 30.0–36.0)
MCV: 93.3 fL (ref 78.0–100.0)
PLATELETS: 349 10*3/uL (ref 150–400)
RBC: 3.58 MIL/uL — ABNORMAL LOW (ref 3.87–5.11)
RDW: 14.1 % (ref 11.5–15.5)
WBC: 11 10*3/uL — ABNORMAL HIGH (ref 4.0–10.5)

## 2017-08-19 MED ORDER — KETOROLAC TROMETHAMINE 10 MG PO TABS: 10.0000 mg | ORAL_TABLET | Freq: Three times a day (TID) | ORAL | 0 refills | Status: DC | PRN

## 2017-08-19 MED ORDER — AMOXICILLIN 500 MG PO TABS: 1000.0000 mg | ORAL_TABLET | Freq: Three times a day (TID) | ORAL | 0 refills | Status: AC

## 2017-08-19 MED ORDER — ACYCLOVIR 400 MG PO TABS: 400.0000 mg | ORAL_TABLET | Freq: Every day | ORAL | 0 refills | Status: AC

## 2017-08-19 MED ORDER — NYSTATIN 100000 UNIT/ML MT SUSP: 5.0000 mL | Freq: Two times a day (BID) | OROMUCOSAL | 0 refills | Status: DC | PRN

## 2017-08-19 NOTE — Discharge Summary (Addendum)
Physician Discharge Summary  [name] GNF:[id number] DOB: April 13, 1971 DOA: 08/15/2017  PCP: Doristine Bosworth, MD  Admit date: 08/15/2017 Discharge date: 08/19/2017  Admitted From: Home Disposition:  Home  Recommendations for Outpatient Follow-up:  1. Follow up with PCP in 1 week 2. Follow up with Dr. Pollyann Kennedy, ENT, next week as previously scheduled  3. Please obtain BMP in 1 week to recheck kidney function 4. Please repeat CXR in 3-4 weeks to ensure resolution of pneumonia    Discharge Condition: Stable CODE STATUS: Full  Diet recommendation: Heart healthy diet   Brief/Interim Summary: Helen Greene is a 47 year old female with a history of obstructive sleep apnea, intolerant to CPAP, restless leg syndrome, anxiety disorder, obesity who has long-standing history of tonsillar stones and underwent tonsillectomy on August 11, 2017 by Dr. Pollyann Kennedy (ENT). After surgery, she was discharged home. Patient started having chills and fever also had one episode of vomiting. Patient says that she has not been able to cough out phlegm also unable to blow her nose. She has had pain with swallowing food and liquids at home but tried her best to keep hydrated. In the ED, work up revealed RUL pneumonia, sepsis, AKI, hypotension. She was started on broad-spectrum antibiotics. Blood culture returned positive for PCN-sensitive strep pneumo. She was treated with IV rocephin and WBC trended down to normal. She was given IVF and AKI resolved back to baseline kidney function. She is discharged home on amoxicillin to complete total 14 days of treatment inclusive of IV antibiotics received during hospitalization.   Discharge Diagnoses:  Principal Problem:   Sepsis due to pneumonia [facility]) Active Problems:   AKI (acute kidney injury) (HCC)   Right sided abdominal pain   History of tonsillectomy Feb 25,2019   Hypertension   Sleep apnea   Anxiety   Restless leg syndrome   Post-op pneumonia   Morbid obesity with BMI of  40.0-44.9, adult (HCC)   Shock liver  Sepsis due to right upper lobar pneumonia, strep pneumo bacteremia  -Influenza negative  -HIV negative  -Blood culture with strep pneumo, PCN and cephalosporin sensitive  -Treated with vanco/rocephin initially, transitioned to oral amoxicillin   AKI  -Baseline creatinine 0.88 -Likely secondary to poor PO intake post-op -Resolved -Now off IVF. Resume home lisinopril-HCTZ   Recent tonsillectomy -CT soft tissue neck: swelling/inflammation at tonsillectomy sites with symmetry favoring postoperative change over surgical site infection. No evidence of abscess or airway narrowing. Left maxillary and ethmoid sinusitis. -Will order topical anesthetic for comfort  Anxiety -Vistarilprn -Continue Wellbutrin and Lexapro  Hypertension -Resume home lisinopril-HCTZ  OSA -Does not utilize CPAP at home  Restless leg syndrome -Continue Mirapex  HSV 1 -Recurrent outbreak -Acyclovir x 5 days   Morbid obesity with BMI of 40.0-44.9, adult     Discharge Instructions  Discharge Instructions    Call MD for:  difficulty breathing, headache or visual disturbances   Complete by:  As directed    Call MD for:  extreme fatigue   Complete by:  As directed    Call MD for:  hives   Complete by:  As directed    Call MD for:  persistant dizziness or light-headedness   Complete by:  As directed    Call MD for:  persistant nausea and vomiting   Complete by:  As directed    Call MD for:  severe uncontrolled pain   Complete by:  As directed    Call MD for:  temperature >100.4   Complete  by:  As directed    Diet - low sodium heart healthy   Complete by:  As directed    Discharge instructions   Complete by:  As directed    You were cared for by a hospitalist during your hospital stay. If you have any questions about your discharge medications or the care you received while you were in the hospital after you are discharged, you can call the unit and  asked to speak with the hospitalist on call if the hospitalist that took care of you is not available. Once you are discharged, your primary care physician will handle any further medical issues. Please note that NO REFILLS for any discharge medications will be authorized once you are discharged, as it is imperative that you return to your primary care physician (or establish a relationship with a primary care physician if you do not have one) for your aftercare needs so that they can reassess your need for medications and monitor your lab values.   Increase activity slowly   Complete by:  As directed      Allergies as of 08/19/2017      Reactions   Bee Venom Hives, Swelling   Enablex [darifenacin Hydrobromide] Other (See Comments)   Phlebitis.   Erythromycin Hives   Macrobid [nitrofurantoin Monohyd Macro] Nausea And Vomiting   Nitrofuran Derivatives Nausea And Vomiting      Medication List    STOP taking these medications   HYDROcodone-acetaminophen 7.5-325 mg/15 ml solution Commonly known as:  HYCET   ibuprofen 800 MG tablet Commonly known as:  ADVIL,MOTRIN   valACYclovir 1000 MG tablet Commonly known as:  VALTREX     TAKE these medications   acyclovir 400 MG tablet Commonly known as:  ZOVIRAX Take 1 tablet (400 mg total) by mouth 5 (five) times daily for 3 days.   amoxicillin 500 MG tablet Commonly known as:  AMOXIL Take 2 tablets (1,000 mg total) by mouth 3 (three) times daily for 9 days.   buPROPion 300 MG 24 hr tablet Commonly known as:  WELLBUTRIN XL Take 1 tablet (300 mg total) by mouth daily.   clobetasol cream 0.05 % Commonly known as:  TEMOVATE Apply 1 application topically 2 (two) times daily. Thin application to left vulva. What changed:    when to take this  reasons to take this  additional instructions   clotrimazole-betamethasone cream Commonly known as:  LOTRISONE Apply 1 application topically daily as needed (rash).   cyclobenzaprine 5 MG  tablet Commonly known as:  FLEXERIL Take 1 tablet (5 mg total) by mouth 3 (three) times daily as needed for muscle spasms.   escitalopram 10 MG tablet Commonly known as:  LEXAPRO TAKE 1 TABLET(10 MG) BY MOUTH DAILY   folic acid 1 MG tablet Commonly known as:  FOLVITE Take 1 tablet (1 mg total) by mouth daily.   hydrOXYzine 25 MG tablet Commonly known as:  ATARAX/VISTARIL Take 25 mg by mouth 3 (three) times daily as needed for anxiety or vomiting.   ketorolac 10 MG tablet Commonly known as:  TORADOL Take 1 tablet (10 mg total) by mouth every 8 (eight) hours as needed for moderate pain or severe pain.   lisinopril-hydrochlorothiazide 10-12.5 MG tablet Commonly known as:  PRINZIDE,ZESTORETIC TAKE 1 TABLET BY MOUTH DAILY   MYRBETRIQ 50 MG Tb24 tablet Generic drug:  mirabegron ER Take 50 mg by mouth daily.   nystatin 100000 UNIT/ML suspension Commonly known as:  MYCOSTATIN Use as directed 5 mLs (500,000  Units total) in the mouth or throat 2 (two) times daily as needed (for thrush).   nystatin powder Commonly known as:  MYCOSTATIN/NYSTOP Apply topically 2 (two) times daily. What changed:    how much to take  when to take this  reasons to take this   pramipexole 1.5 MG tablet Commonly known as:  MIRAPEX Take 1 tablet (1.5 mg total) by mouth at bedtime.   promethazine 25 MG suppository Commonly known as:  PHENERGAN Place 1 suppository (25 mg total) rectally every 6 (six) hours as needed for nausea or vomiting.      Follow-up Information    Doristine Bosworth, MD. Schedule an appointment as soon as possible for a visit in 1 week(s).   Specialty:  Internal Medicine Contact information: [address] [place] [address] [phone]        Serena Colonel, MD Follow up.   Specialty:  Otolaryngology Why:  Follow up next week as previously scheduled for post-op follow up  Contact information: [address] [place] [address] [phone]           Allergies  Allergen Reactions  . Bee Venom Hives and Swelling  . Enablex [Darifenacin Hydrobromide] Other (See Comments)    Phlebitis.  . Erythromycin Hives  . Macrobid [Nitrofurantoin Monohyd Macro] Nausea And Vomiting  . Nitrofuran Derivatives Nausea And Vomiting    Consultations:  ENT Dr. Pollyann Kennedy    Procedures/Studies: Dg Chest 2 View  Result Date: 07/30/2017 CLINICAL DATA:  Several week history of chest pain and tightness and shortness of breath. Also left arm pain and numbness. History of hypertension, nonsmoker. EXAM: CHEST  2 VIEW COMPARISON:  CT scan of the chest of April 21, 2012 FINDINGS: The lungs are well-expanded and clear. The heart and pulmonary vascularity are normal. The mediastinum is normal in width. There is no pleural effusion. The trachea is midline. The bony thorax is unremarkable. IMPRESSION: There is no active cardiopulmonary disease. Electronically Signed   By: David  [place] M.D.   On: 07/30/2017 08:37   Ct Soft Tissue Neck Wo Contrast  Result Date: 08/15/2017 CLINICAL DATA:  Fever and sepsis. Tonsillectomy 08/11/2017. Noncontrast study due to acute renal failure. EXAM: CT NECK WITHOUT CONTRAST TECHNIQUE: Multidetector CT imaging of the neck was performed following the standard protocol without intravenous contrast. COMPARISON:  None. FINDINGS: Pharynx and larynx: There is low-density wall thickening and mild retropharyngeal edematous appearance symmetrically about the bilateral tonsillar fossa in this patient with recent tonsillectomy. No airway narrowing or suspected abscess. Salivary glands: No inflammation, mass, or stone. Thyroid: Normal. Lymph nodes: None enlarged or abnormal density. Vascular: Negative without contrast. Limited intracranial: Negative Visualized orbits: Negative Mastoids and visualized paranasal sinuses: Mucosal thickening and small high-density effusion in the left maxillary sinus. Left ethmoid opacification. Skeleton: Negative Upper  chest: Dense consolidation partially seen in the right upper lobe. This is new from a 07/30/2017 chest x-ray IMPRESSION: 1. Right upper lobe pneumonia. 2. Swelling/inflammation at tonsillectomy sites with symmetry favoring postoperative change over surgical site infection. No evidence of abscess or airway narrowing. 3. Left maxillary and ethmoid sinusitis. Electronically Signed   By: Marnee Spring M.D.   On: 08/15/2017 08:53   Dg Chest Port 1 View  Result Date: 08/15/2017 CLINICAL DATA:  47 year old female with hypotension. EXAM: PORTABLE CHEST 1 VIEW COMPARISON:  Chest radiograph dated 07/30/2017 FINDINGS: There has been interval development of a focal area of opacity in the right upper lung field most consistent with pneumonia  given the rapid interval development. Underlying mass is less likely but not entirely excluded. Correlation with clinical exam and follow-up after treatment recommended to document resolution. There is no pleural effusion or pneumothorax. The cardiac silhouette is within normal limits. No acute osseous pathology. IMPRESSION: Findings most consistent with pneumonia. Clinical correlation and follow-up to resolution recommended. Electronically Signed   By: Elgie Collard M.D.   On: 08/15/2017 06:01   Dg Abd Acute W/chest  Result Date: 08/15/2017 CLINICAL DATA:  Flu-like symptoms.  Dizziness. EXAM: DG ABDOMEN ACUTE W/ 1V CHEST COMPARISON:  Chest x-ray same date. Chest x-ray 07/30/2017. CT abdomen 12/07/2016. FINDINGS: Mediastinum hilar structures are normal. Heart size normal. Again noted is infiltrate right upper lung most consistent pneumonia. Mild bibasilar subsegmental atelectasis. No pleural effusion or pneumothorax. Surgical clips right upper quadrant. No bowel distention. No free air. Calcification of the left kidney suggesting nephrolithiasis again noted. Mild lumbar scoliosis concave left. IMPRESSION: 1. Persistent infiltrate right upper lung consistent with pneumonia. Mild  bibasilar subsegmental atelectasis. 2. No acute intra-abdominal abnormality identified. Left nephrolithiasis again noted. Electronically Signed   By: Maisie Fus  Register   On: 08/15/2017 08:07       Discharge Exam: Vitals:   08/19/17 0532 08/19/17 0738  BP: 136/78 (!) 145/83  Pulse: 86 90  Resp:  18  Temp: 98.2 F (36.8 C) 97.9 F (36.6 C)  SpO2: 99% 99%    General: Pt is alert, awake, not in acute distress ENT: Throat without erythema, drainage  Cardiovascular: RRR, S1/S2 +, no rubs, no gallops Respiratory: CTA bilaterally, no wheezing, no rhonchi Abdominal: Soft, NT, ND, bowel sounds + Extremities: no edema, no cyanosis    The results of significant diagnostics from this hospitalization (including imaging, microbiology, ancillary and laboratory) are listed below for reference.     Microbiology: Recent Results (from the past 240 hour(s))  Culture, blood (x 2)     Status: Abnormal   Collection Time: 08/15/17  6:47 AM  Result Value Ref Range Status   Specimen Description BLOOD LEFT FOREARM  Final   Special Requests   Final    BOTTLES DRAWN AEROBIC AND ANAEROBIC Blood Culture adequate volume   Culture  Setup Time   Final    GRAM POSITIVE COCCI ANAEROBIC BOTTLE ONLY CRITICAL VALUE NOTED.  VALUE IS CONSISTENT WITH PREVIOUSLY REPORTED AND CALLED VALUE.    Culture (A)  Final    STREPTOCOCCUS PNEUMONIAE SUSCEPTIBILITIES PERFORMED ON PREVIOUS CULTURE WITHIN THE LAST 5 DAYS. Performed at [facility], [address], [place], [place] [address]    Report Status 08/17/2017 FINAL  Final  Culture, blood (x 2)     Status: Abnormal   Collection Time: 08/15/17  6:47 AM  Result Value Ref Range Status   Specimen Description BLOOD RIGHT ANTECUBITAL  Final   Special Requests   Final    BOTTLES DRAWN AEROBIC AND ANAEROBIC Blood Culture adequate volume   Culture  Setup Time   Final    GRAM POSITIVE COCCI IN BOTH AEROBIC AND ANAEROBIC BOTTLES CRITICAL RESULT CALLED TO, READ BACK  BY AND VERIFIED WITH: Lytle Butte PHARMD [id number] 08/15/17 A BROWNING Performed at [facility], [address] [place], [place] [address]    Culture STREPTOCOCCUS PNEUMONIAE (A)  Final   Report Status 08/17/2017 FINAL  Final   Organism ID, Bacteria STREPTOCOCCUS PNEUMONIAE  Final      Susceptibility   Streptococcus pneumoniae - MIC*    ERYTHROMYCIN 2 RESISTANT Resistant  LEVOFLOXACIN 0.5 SENSITIVE Sensitive     PENICILLIN (meningitis) <=0.06 SENSITIVE Sensitive     PENICILLIN (non-meningitis) <=0.06 SENSITIVE Sensitive     CEFTRIAXONE (non-meningitis) <=0.12 SENSITIVE Sensitive     CEFTRIAXONE (meningitis) <=0.12 SENSITIVE Sensitive     * STREPTOCOCCUS PNEUMONIAE  Blood Culture ID Panel (Reflexed)     Status: Abnormal   Collection Time: 08/15/17  6:47 AM  Result Value Ref Range Status   Enterococcus species NOT DETECTED NOT DETECTED Final   Listeria monocytogenes NOT DETECTED NOT DETECTED Final   Staphylococcus species NOT DETECTED NOT DETECTED Final   Staphylococcus aureus NOT DETECTED NOT DETECTED Final   Streptococcus species DETECTED (A) NOT DETECTED Final    Comment: CRITICAL RESULT CALLED TO, READ BACK BY AND VERIFIED WITH: Lytle Butte PHARMD 2228 08/15/17 A BROWNING    Streptococcus agalactiae NOT DETECTED NOT DETECTED Final   Streptococcus pneumoniae DETECTED (A) NOT DETECTED Final    Comment: CRITICAL RESULT CALLED TO, READ BACK BY AND VERIFIED WITH: Lytle Butte PHARMD 2228 08/15/17 A BROWNING    Streptococcus pyogenes NOT DETECTED NOT DETECTED Final   Acinetobacter baumannii NOT DETECTED NOT DETECTED Final   Enterobacteriaceae species NOT DETECTED NOT DETECTED Final   Enterobacter cloacae complex NOT DETECTED NOT DETECTED Final   Escherichia coli NOT DETECTED NOT DETECTED Final   Klebsiella oxytoca NOT DETECTED NOT DETECTED Final   Klebsiella pneumoniae NOT DETECTED NOT DETECTED Final   Proteus species NOT DETECTED NOT DETECTED Final   Serratia marcescens NOT DETECTED NOT  DETECTED Final   Haemophilus influenzae NOT DETECTED NOT DETECTED Final   Neisseria meningitidis NOT DETECTED NOT DETECTED Final   Pseudomonas aeruginosa NOT DETECTED NOT DETECTED Final   Candida albicans NOT DETECTED NOT DETECTED Final   Candida glabrata NOT DETECTED NOT DETECTED Final   Candida krusei NOT DETECTED NOT DETECTED Final   Candida parapsilosis NOT DETECTED NOT DETECTED Final   Candida tropicalis NOT DETECTED NOT DETECTED Final  Urine culture     Status: None   Collection Time: 08/15/17  9:45 AM  Result Value Ref Range Status   Specimen Description URINE, CLEAN CATCH  Final   Special Requests NONE  Final   Culture   Final    NO GROWTH Performed at [facility], [address], [place], [place] [address]    Report Status 08/16/2017 FINAL  Final  MRSA PCR Screening     Status: None   Collection Time: 08/15/17  4:00 PM  Result Value Ref Range Status   MRSA by PCR NEGATIVE NEGATIVE Final    Comment:        The GeneXpert MRSA Assay (FDA approved for NASAL specimens only), is one component of a comprehensive MRSA colonization surveillance program. It is not intended to diagnose MRSA infection nor to guide or monitor treatment for MRSA infections. Performed at [facility], [address], [place], [place] [address]      Labs: BNP (last 3 results) No results for input(s): BNP in the last 8760 hours. Basic Metabolic Panel: Recent Labs  Lab 08/15/17 0923 08/16/17 0242 08/17/17 0224 08/18/17 0241 08/19/17 0506  NA 135 134* 133* 136 137  K 3.7 3.4* 3.9 3.6 3.5  CL 101 101 103 107 106  CO2 24 23 22  20* 22  GLUCOSE 152* 125* 105* 83 78  BUN 22* 17 10 9 10   CREATININE 2.59* 1.39* 0.88 0.85 0.78  CALCIUM 7.3* 7.4* 7.5* 8.0* 8.3*   Liver Function Tests: Recent Labs  Lab 08/15/17 0923 08/16/17 0242  AST 53* 29  ALT 94* 62*  ALKPHOS 165* 163*  BILITOT 2.0* 0.7  PROT 6.0* 5.8*  ALBUMIN 2.5* 2.1*   No results for input(s): LIPASE, AMYLASE in  the last 168 hours. No results for input(s): AMMONIA in the last 168 hours. CBC: Recent Labs  Lab 08/15/17 0335 08/15/17 4098 08/16/17 0242 08/17/17 0224 08/18/17 0241 08/19/17 0506  WBC 49.4* 44.4* 33.3* 20.9* 14.1* 11.0*  NEUTROABS 44.4* 40.4* 30.3*  --   --   --   HGB 13.8 13.6 11.9* 10.6* 10.5* 10.9*  HCT 41.9 41.3 35.9* 32.8* 32.7* 33.4*  MCV 94.4 93.9 93.5 95.1 94.2 93.3  PLT 326 257 267 253 297 349   Cardiac Enzymes: Recent Labs  Lab 08/15/17 0517  TROPONINI <0.03   BNP: Invalid input(s): POCBNP CBG: Recent Labs  Lab 08/15/17 0413  GLUCAP 149*   D-Dimer No results for input(s): DDIMER in the last 72 hours. Hgb A1c No results for input(s): HGBA1C in the last 72 hours. Lipid Profile No results for input(s): CHOL, HDL, LDLCALC, TRIG, CHOLHDL, LDLDIRECT in the last 72 hours. Thyroid function studies No results for input(s): TSH, T4TOTAL, T3FREE, THYROIDAB in the last 72 hours.  Invalid input(s): FREET3 Anemia work up No results for input(s): VITAMINB12, FOLATE, FERRITIN, TIBC, IRON, RETICCTPCT in the last 72 hours. Urinalysis    Component Value Date/Time   COLORURINE YELLOW 08/15/2017 0945   APPEARANCEUR CLOUDY (A) 08/15/2017 0945   LABSPEC 1.011 08/15/2017 0945   PHURINE 6.0 08/15/2017 0945   GLUCOSEU NEGATIVE 08/15/2017 0945   HGBUR SMALL (A) 08/15/2017 0945   BILIRUBINUR NEGATIVE 08/15/2017 0945   BILIRUBINUR small (A) 12/07/2016 1455   BILIRUBINUR neg 02/13/2015 1943   KETONESUR NEGATIVE 08/15/2017 0945   PROTEINUR 30 (A) 08/15/2017 0945   UROBILINOGEN 0.2 12/07/2016 1455   UROBILINOGEN 0.2 07/24/2014 0949   NITRITE NEGATIVE 08/15/2017 0945   LEUKOCYTESUR NEGATIVE 08/15/2017 0945   Sepsis Labs Invalid input(s): PROCALCITONIN,  WBC,  LACTICIDVEN Microbiology Recent Results (from the past 240 hour(s))  Culture, blood (x 2)     Status: Abnormal   Collection Time: 08/15/17  6:47 AM  Result Value Ref Range Status   Specimen Description BLOOD  LEFT FOREARM  Final   Special Requests   Final    BOTTLES DRAWN AEROBIC AND ANAEROBIC Blood Culture adequate volume   Culture  Setup Time   Final    GRAM POSITIVE COCCI ANAEROBIC BOTTLE ONLY CRITICAL VALUE NOTED.  VALUE IS CONSISTENT WITH PREVIOUSLY REPORTED AND CALLED VALUE.    Culture (A)  Final    STREPTOCOCCUS PNEUMONIAE SUSCEPTIBILITIES PERFORMED ON PREVIOUS CULTURE WITHIN THE LAST 5 DAYS. Performed at [facility], [address] [place], [place] [address]    Report Status 08/17/2017 FINAL  Final  Culture, blood (x 2)     Status: Abnormal   Collection Time: 08/15/17  6:47 AM  Result Value Ref Range Status   Specimen Description BLOOD RIGHT ANTECUBITAL  Final   Special Requests   Final    BOTTLES DRAWN AEROBIC AND ANAEROBIC Blood Culture adequate volume   Culture  Setup Time   Final    GRAM POSITIVE COCCI IN BOTH AEROBIC AND ANAEROBIC BOTTLES CRITICAL RESULT CALLED TO, READ BACK BY AND VERIFIED WITH: Lytle Butte PHARMD 7829 08/15/17 A BROWNING Performed at [facility], [address] [place], [place] [address]    Culture STREPTOCOCCUS PNEUMONIAE (A)  Final   Report Status 08/17/2017  FINAL  Final   Organism ID, Bacteria STREPTOCOCCUS PNEUMONIAE  Final      Susceptibility   Streptococcus pneumoniae - MIC*    ERYTHROMYCIN 2 RESISTANT Resistant     LEVOFLOXACIN 0.5 SENSITIVE Sensitive     PENICILLIN (meningitis) <=0.06 SENSITIVE Sensitive     PENICILLIN (non-meningitis) <=0.06 SENSITIVE Sensitive     CEFTRIAXONE (non-meningitis) <=0.12 SENSITIVE Sensitive     CEFTRIAXONE (meningitis) <=0.12 SENSITIVE Sensitive     * STREPTOCOCCUS PNEUMONIAE  Blood Culture ID Panel (Reflexed)     Status: Abnormal   Collection Time: 08/15/17  6:47 AM  Result Value Ref Range Status   Enterococcus species NOT DETECTED NOT DETECTED Final   Listeria monocytogenes NOT DETECTED NOT DETECTED Final   Staphylococcus species NOT DETECTED NOT DETECTED Final   Staphylococcus aureus NOT  DETECTED NOT DETECTED Final   Streptococcus species DETECTED (A) NOT DETECTED Final    Comment: CRITICAL RESULT CALLED TO, READ BACK BY AND VERIFIED WITH: Lytle ButteM MACCIA PHARMD 2228 08/15/17 A BROWNING    Streptococcus agalactiae NOT DETECTED NOT DETECTED Final   Streptococcus pneumoniae DETECTED (A) NOT DETECTED Final    Comment: CRITICAL RESULT CALLED TO, READ BACK BY AND VERIFIED WITH: Lytle ButteM MACCIA PHARMD 2228 08/15/17 A BROWNING    Streptococcus pyogenes NOT DETECTED NOT DETECTED Final   Acinetobacter baumannii NOT DETECTED NOT DETECTED Final   Enterobacteriaceae species NOT DETECTED NOT DETECTED Final   Enterobacter cloacae complex NOT DETECTED NOT DETECTED Final   Escherichia coli NOT DETECTED NOT DETECTED Final   Klebsiella oxytoca NOT DETECTED NOT DETECTED Final   Klebsiella pneumoniae NOT DETECTED NOT DETECTED Final   Proteus species NOT DETECTED NOT DETECTED Final   Serratia marcescens NOT DETECTED NOT DETECTED Final   Haemophilus influenzae NOT DETECTED NOT DETECTED Final   Neisseria meningitidis NOT DETECTED NOT DETECTED Final   Pseudomonas aeruginosa NOT DETECTED NOT DETECTED Final   Candida albicans NOT DETECTED NOT DETECTED Final   Candida glabrata NOT DETECTED NOT DETECTED Final   Candida krusei NOT DETECTED NOT DETECTED Final   Candida parapsilosis NOT DETECTED NOT DETECTED Final   Candida tropicalis NOT DETECTED NOT DETECTED Final  Urine culture     Status: None   Collection Time: 08/15/17  9:45 AM  Result Value Ref Range Status   Specimen Description URINE, CLEAN CATCH  Final   Special Requests NONE  Final   Culture   Final    NO GROWTH Performed at [facility], [address] [place], [place] [id number]    Report Status 08/16/2017 FINAL  Final  MRSA PCR Screening     Status: None   Collection Time: 08/15/17  4:00 PM  Result Value Ref Range Status   MRSA by PCR NEGATIVE NEGATIVE Final    Comment:        The GeneXpert MRSA Assay (FDA approved for NASAL  specimens only), is one component of a comprehensive MRSA colonization surveillance program. It is not intended to diagnose MRSA infection nor to guide or monitor treatment for MRSA infections. Performed at [facility], [address] [place], [place] [address]      Patient was seen and examined on the day of discharge and was found to be in stable condition. Time coordinating discharge: 30 minutes including assessment and coordination of care, as well as examination of the patient.   SIGNED:  Noralee StainJennifer Vickie Melnik, DO [facility] Pager [phone]  If 7PM-7AM, please contact night-coverage [email or web address] Password [email or web address] 08/19/2017, 9:11 AM

## 2017-08-19 NOTE — Progress Notes (Signed)
Discharge instructions given and medications reviewed.  Pt denies questions.  IV removed with cannula intact.  Tolerated well.

## 2017-08-19 NOTE — Discharge Instructions (Signed)
Acute Pain, Adult  Acute pain is a type of pain that may last for just a few days or as long as six months. It is often related to an illness, injury, or medical procedure. Acute pain may be mild, moderate, or severe. It usually goes away once your injury has healed or you are no longer ill.  Pain can make it hard for you to do daily activities. It can cause anxiety and lead to other problems if left untreated. Treatment depends on the cause and severity of your acute pain.  Follow these instructions at home:   Check your pain level as told by your health care provider.   Take over-the-counter and prescription medicines only as told by your health care provider.   If you are taking prescription pain medicine:  ? Ask your health care provider about taking a stool softener or laxative to prevent constipation.  ? Do not stop taking the medicine suddenly. Talk to your health care provider about how and when to discontinue prescription pain medicine.  ? If your pain is severe, do not take more pills than instructed by your health care provider.  ? Do not take other over-the-counter pain medicines in addition to this medicine unless told by your health care provider.  ? Do not drive or operate heavy machinery while taking prescription pain medicine.   Apply ice or heat as told by your health care provider. These may reduce swelling and pain.   Ask your health care provider if other strategies such as distraction, relaxation, or physical therapies can help your pain.   Keep all follow-up visits as told by your health care provider. This is important.  Contact a health care provider if:   You have pain that is not controlled by medicine.   Your pain does not improve or gets worse.   You have side effects from pain medicines, such as vomitingor confusion.  Get help right away if:   You have severe pain.   You have trouble breathing.   You lose consciousness.   You have chest pain or pressure that lasts for more  than a few minutes. Along with the chest pain you may:  ? Have pain or discomfort in one or both arms, your back, neck, jaw, or stomach.  ? Have shortness of breath.  ? Break out in a cold sweat.  ? Feel nauseous.  ? Become light-headed.  These symptoms may represent a serious problem that is an emergency. Do not wait to see if the symptoms will go away. Get medical help right away. Call your local emergency services (911 in the [place] Do not drive yourself to the hospital.  This information is not intended to replace advice given to you by your health care provider. Make sure you discuss any questions you have with your health care provider.  Document Released: 06/18/2015 Document Revised: 11/10/2015 Document Reviewed: 06/18/2015  Elsevier Interactive Patient Education  2018 Elsevier Inc.

## 2017-08-19 NOTE — Progress Notes (Signed)
Discharged via wheelchair for home with family.  No s/s of distress.

## 2017-08-24 NOTE — Progress Notes (Signed)
Chief Complaint  Patient presents with  . Hospitalization Follow-up    HPI   Pt following up for postop RUL pneumonia  She was hospitalized 08/15/17-08/19/17 and discharged home with diagnoses -  Sepsis due to RUL PNA -  AKI and hypotension due to poor PO intake -  S/p tonsillectomy She was advised to get repeat BMP and to follow up for repeat CXR in one month around 09/15/17 She was discharged home on amoxicillin as her PNA was due to Strep Pneumo  Pneumonia Denies cough, she has some low grade temperatures No shortness of breath She is on amoxicillin as instructed She feels tired most days.  Postop tonsillectomy Since that time she has been feeling low energy with continued difficulty swallowing but she has been hydrating and taking the amoxicillin She denies any chest pains, palpitations, headaches, shortness of breath She has some low grade temps.     Past Medical History:  Diagnosis Date  . Allergy   . Anxiety   . Depression   . Elevated white blood cell count   . Elevated white blood cell count 09/25/2011  . GERD (gastroesophageal reflux disease)   . Headache    Migraine  . Hypertension   . Kidney stone   . Neuromuscular disorder (HCC)    carpel tunnel disease  . Pneumonia 08/15/2017  . Restless leg syndrome   . Shortness of breath dyspnea    with exertion  . Sleep apnea    does not have CPAP machine, mostly affects back sleeping was encouraged to sleep on her sides  . Tonsillar calculus     Current Outpatient Medications  Medication Sig Dispense Refill  . amoxicillin (AMOXIL) 500 MG tablet Take 2 tablets (1,000 mg total) by mouth 3 (three) times daily for 9 days. 54 tablet 0  . buPROPion (WELLBUTRIN XL) 300 MG 24 hr tablet Take 1 tablet (300 mg total) by mouth daily. 90 tablet 1  . clotrimazole-betamethasone (LOTRISONE) cream Apply 1 application topically daily as needed (rash).     Marland Kitchen escitalopram (LEXAPRO) 10 MG tablet TAKE 1 TABLET(10 MG) BY MOUTH DAILY 90  tablet 0  . folic acid (FOLVITE) 1 MG tablet Take 1 tablet (1 mg total) by mouth daily. 90 tablet 1  . hydrOXYzine (ATARAX/VISTARIL) 25 MG tablet Take 25 mg by mouth 3 (three) times daily as needed for anxiety or vomiting.    Marland Kitchen ketorolac (TORADOL) 10 MG tablet Take 1 tablet (10 mg total) by mouth every 8 (eight) hours as needed for moderate pain or severe pain. 20 tablet 0  . lisinopril-hydrochlorothiazide (PRINZIDE,ZESTORETIC) 10-12.5 MG tablet TAKE 1 TABLET BY MOUTH DAILY 90 tablet 0  . mirabegron ER (MYRBETRIQ) 50 MG TB24 tablet Take 50 mg by mouth daily.    Marland Kitchen nystatin (MYCOSTATIN) 100000 UNIT/ML suspension Use as directed 5 mLs (500,000 Units total) in the mouth or throat 2 (two) times daily as needed (for thrush). 60 mL 0  . pramipexole (MIRAPEX) 1.5 MG tablet Take 1 tablet (1.5 mg total) by mouth at bedtime. 90 tablet 1  . clobetasol cream (TEMOVATE) 0.05 % Apply 1 application topically 2 (two) times daily. Thin application to left vulva. (Patient not taking: Reported on 08/25/2017) 30 g 0  . cyclobenzaprine (FLEXERIL) 5 MG tablet Take 1 tablet (5 mg total) by mouth 3 (three) times daily as needed for muscle spasms. (Patient not taking: Reported on 08/25/2017) 90 tablet 6  . fluconazole (DIFLUCAN) 150 MG tablet Take 1 tablet (150 mg total) by  mouth once for 1 dose. Repeat second dose in 3 days. 2 tablet 0  . nystatin (MYCOSTATIN/NYSTOP) powder Apply topically 2 (two) times daily. (Patient taking differently: Apply 1 g topically 2 (two) times daily as needed (RASH). ) 15 g 1  . promethazine (PHENERGAN) 25 MG suppository Place 1 suppository (25 mg total) rectally every 6 (six) hours as needed for nausea or vomiting. (Patient not taking: Reported on 08/25/2017) 12 suppository 1   No current facility-administered medications for this visit.     Allergies:  Allergies  Allergen Reactions  . Bee Venom Hives and Swelling  . Enablex [Darifenacin Hydrobromide] Other (See Comments)    Phlebitis.  .  Erythromycin Hives  . Macrobid [Nitrofurantoin Monohyd Macro] Nausea And Vomiting  . Nitrofuran Derivatives Nausea And Vomiting    Past Surgical History:  Procedure Laterality Date  . ABDOMINAL HYSTERECTOMY    . CHOLECYSTECTOMY    . COLONOSCOPY    . DILATION AND CURETTAGE OF UTERUS  1993  . ECTOPIC PREGNANCY SURGERY    . ESSURE TUBAL LIGATION    . LITHOTRIPSY    . ROBOTIC ASSISTED TOTAL HYSTERECTOMY WITH SALPINGECTOMY Left 09/14/2015   Procedure: ROBOTIC ASSISTED TOTAL HYSTERECTOMY WITH LEFT SALPINGECTOMY;  Surgeon: Noland FordyceKelly Fogleman, MD;  Location: [facility];  Service: Gynecology;  Laterality: Left;  . TONSILLECTOMY N/A 08/11/2017   Procedure: TONSILLECTOMY;  Surgeon: Serena Colonelosen, Jefry, MD;  Location: [facility];  Service: ENT;  Laterality: N/A;  . TONSILLECTOMY  07/2017  . UPPER GI ENDOSCOPY    . WISDOM TOOTH EXTRACTION      Social History   Socioeconomic History  . Marital status: Divorced    Spouse name: None  . Number of children: None  . Years of education: None  . Highest education level: None  Social Needs  . Financial resource strain: None  . Food insecurity - worry: None  . Food insecurity - inability: None  . Transportation needs - medical: None  . Transportation needs - non-medical: None  Occupational History  . None  Tobacco Use  . Smoking status: Never Smoker  . Smokeless tobacco: Never Used  Substance and Sexual Activity  . Alcohol use: Yes    Alcohol/week: 0.0 oz    Comment: social  . Drug use: No  . Sexual activity: Not Currently    Partners: Male    Comment: 1ST intercourse- 18, partners-  2   Other Topics Concern  . None  Social History Narrative  . None    Family History  Problem Relation Age of Onset  . Hypertension Mother   . Heart failure Mother   . Cancer Mother   . Heart disease Mother   . Heart disease Brother   . Hypertension Brother   . Cancer Father        Leukemia  . Hypertension Father   . Cancer Sister   . Colon  cancer Neg Hx   . Esophageal cancer Neg Hx   . Rectal cancer Neg Hx   . Stomach cancer Neg Hx      ROS Review of Systems See HPI Skin: No rash or itching Eyes: no blurry vision, no double vision GI: no diarrhea, nausea, vomiting GU: no dysuria or hematuria Neuro: no dizziness or headaches  all others reviewed and negative   Objective: Vitals:   08/25/17 0924  BP: 130/80  Pulse: (!) 119  Resp: 16  Temp: 99.1 F (37.3 C)  TempSrc: Oral  SpO2: 99%  Weight: 193 lb 9.6  oz (87.8 kg)  Height: 4\' 11"  (1.499 m)    Physical Exam  General: alert, oriented, in NAD Head: normocephalic, atraumatic, no sinus tenderness Eyes: EOM intact, no scleral icterus or conjunctival injection Ears: TM clear bilaterally Nose: mucosa nonerythematous, nonedematous Throat: scant pharyngeal exudate without erythema, some edema of posterior pharynx Lymph: no posterior auricular, submental or cervical lymph adenopathy Heart: normal rate, normal sinus rhythm, no murmurs Lungs: clear to auscultation bilaterally, no wheezing    Component     Latest Ref Rng & Units 08/15/2017 08/17/2017 08/18/2017 08/19/2017  Sodium     135 - 145 mmol/L 133 (L) 133 (L) 136 137  Potassium     3.5 - 5.1 mmol/L 3.6 3.9 3.6 3.5  Chloride     101 - 111 mmol/L 93 (L) 103 107 106  CO2     22 - 32 mmol/L 23 22 20  (L) 22  Glucose     65 - 99 mg/dL 161 (H) 096 (H) 83 78  BUN     6 - 20 mg/dL 22 (H) 10 9 10   Creatinine     0.44 - 1.00 mg/dL 0.45 (H) 4.09 8.11 9.14  Calcium     8.9 - 10.3 mg/dL 8.6 (L) 7.5 (L) 8.0 (L) 8.3 (L)  GFR, Est Non African American     >60 mL/min 15 (L) >60 >60 >60  GFR, Est African American     >60 mL/min 17 (L) >60 >60 >60  Anion gap     5 - 15 17 (H) 8 9 9    Assessment and Plan Helen Greene was seen today for hospitalization follow-up.  Diagnoses and all orders for this visit:  AKI (acute kidney injury) (HCC)- improved with hydration  Will reassess today -     Basic metabolic panel  Post-op  pneumonia Pneumonia of right upper lobe due to Streptococcus pneumoniae (HCC) -  Complete amoxicillin Normal pulm exam  S/P tonsillectomy Pt following up with ENT later today Added diflucan incase she develops thrush  But mostly added for yeast vaginitis   Other orders -     fluconazole (DIFLUCAN) 150 MG tablet; Take 1 tablet (150 mg total) by mouth once for 1 dose. Repeat second dose in 3 days.     Kristoff Coonradt A Maddyson Keil

## 2017-08-25 ENCOUNTER — Other Ambulatory Visit: Payer: Self-pay

## 2017-08-25 ENCOUNTER — Ambulatory Visit (INDEPENDENT_AMBULATORY_CARE_PROVIDER_SITE_OTHER): Payer: 59 | Admitting: Family Medicine

## 2017-08-25 ENCOUNTER — Ambulatory Visit: Payer: 59 | Admitting: Family Medicine

## 2017-08-25 ENCOUNTER — Encounter: Payer: Self-pay | Admitting: Family Medicine

## 2017-08-25 VITALS — BP 130/80 | HR 119 | Temp 99.1°F | Resp 16 | Ht 59.0 in | Wt 193.6 lb

## 2017-08-25 DIAGNOSIS — Z9089 Acquired absence of other organs: Secondary | ICD-10-CM | POA: Diagnosis not present

## 2017-08-25 DIAGNOSIS — N179 Acute kidney failure, unspecified: Secondary | ICD-10-CM | POA: Diagnosis not present

## 2017-08-25 DIAGNOSIS — J9589 Other postprocedural complications and disorders of respiratory system, not elsewhere classified: Secondary | ICD-10-CM

## 2017-08-25 DIAGNOSIS — J189 Pneumonia, unspecified organism: Secondary | ICD-10-CM | POA: Diagnosis not present

## 2017-08-25 DIAGNOSIS — J13 Pneumonia due to Streptococcus pneumoniae: Secondary | ICD-10-CM | POA: Diagnosis not present

## 2017-08-25 LAB — BASIC METABOLIC PANEL
BUN/Creatinine Ratio: 15 (ref 9–23)
BUN: 19 mg/dL (ref 6–24)
CALCIUM: 9.8 mg/dL (ref 8.7–10.2)
CO2: 21 mmol/L (ref 20–29)
Chloride: 98 mmol/L (ref 96–106)
Creatinine, Ser: 1.23 mg/dL — ABNORMAL HIGH (ref 0.57–1.00)
GFR, EST AFRICAN AMERICAN: 60 mL/min/{1.73_m2} (ref >59)
GFR, EST NON AFRICAN AMERICAN: 52 mL/min/{1.73_m2} — AB (ref >59)
Glucose: 107 mg/dL — ABNORMAL HIGH (ref 65–99)
Potassium: 4.9 mmol/L (ref 3.5–5.2)
Sodium: 138 mmol/L (ref 134–144)

## 2017-08-25 MED ORDER — FLUCONAZOLE 150 MG PO TABS: 150.0000 mg | ORAL_TABLET | Freq: Once | ORAL | 0 refills | Status: AC

## 2017-08-25 NOTE — Patient Instructions (Addendum)
IF you received an x-ray today, you will receive an invoice from [facility]. Please contact [facility] at [phone] with questions or concerns regarding your invoice.   IF you received labwork today, you will receive an invoice from [facility]. Please contact [facility] at [phone] with questions or concerns regarding your invoice.   Our billing staff will not be able to assist you with questions regarding bills from these companies.  You will be contacted with the lab results as soon as they are available. The fastest way to get your results is to activate your My Chart account. Instructions are located on the last page of this paperwork. If you have not heard from us regarding the results in 2 weeks, please contact this office.     Acute Kidney Injury, Adult Acute kidney injury is a sudden worsening of kidney function. The kidneys are organs that have several jobs. They filter the blood to remove waste products and extra fluid. They also maintain a healthy balance of minerals and hormones in the body, which helps control blood pressure and keep bones strong. With this condition, your kidneys do not do their jobs as well as they should. This condition ranges from mild to severe. Over time it may develop into long-lasting (chronic) kidney disease. Early detection and treatment may prevent acute kidney injury from developing into a chronic condition. What are the causes? Common causes of this condition include:  A problem with blood flow to the kidneys. This may be caused by: ? Low blood pressure (hypotension) or shock. ? Blood loss. ? Heart and blood vessel (cardiovascular) disease. ? Severe burns. ? Liver disease.  Direct damage to the kidneys. This may be caused by: ? Certain medicines. ? A kidney infection. ? Poisoning. ? Being around or in contact with toxic substances. ? A surgical wound. ? A hard, direct hit to the kidney area.  A sudden blockage of  urine flow. This may be caused by: ? Cancer. ? Kidney stones. ? An enlarged prostate in males.  What are the signs or symptoms? Symptoms of this condition may not be obvious until the condition becomes severe. Symptoms of this condition can include:  Tiredness (lethargy), or difficulty staying awake.  Nausea or vomiting.  Swelling (edema) of the face, legs, ankles, or feet.  Problems with urination, such as: ? Abdominal pain, or pain along the side of your stomach (flank). ? Decreased urine production. ? Decrease in the force of urine flow.  Muscle twitches and cramps, especially in the legs.  Confusion or trouble concentrating.  Loss of appetite.  Fever.  How is this diagnosed? This condition may be diagnosed with tests, including:  Blood tests.  Urine tests.  Imaging tests.  A test in which a sample of tissue is removed from the kidneys to be examined under a microscope (kidney biopsy).  How is this treated? Treatment for this condition depends on the cause and how severe the condition is. In mild cases, treatment may not be needed. The kidneys may heal on their own. In more severe cases, treatment will involve:  Treating the cause of the kidney injury. This may involve changing any medicines you are taking or adjusting your dosage.  Fluids. You may need specialized IV fluids to balance your body's needs.  Having a catheter placed to drain urine and prevent blockages.  Preventing problems from occurring. This may mean avoiding certain medicines or procedures that can cause further injury to the kidneys.  In  some cases treatment may also require:  A procedure to remove toxic wastes from the body (dialysis or continuous renal replacement therapy - CRRT).  Surgery. This may be done to repair a torn kidney, or to remove the blockage from the urinary system.  Follow these instructions at home: Medicines  Take over-the-counter and prescription medicines only as  told by your health care provider.  Do not take any new medicines without your health care provider's approval. Many medicines can worsen your kidney damage.  Do not take any vitamin and mineral supplements without your health care provider's approval. Many nutritional supplements can worsen your kidney damage. Lifestyle  If your health care provider prescribed changes to your diet, follow them. You may need to decrease the amount of protein you eat.  Achieve and maintain a healthy weight. If you need help with this, ask your health care provider.  Start or continue an exercise plan. Try to exercise at least 30 minutes a day, 5 days a week.  Do not use any tobacco products, such as cigarettes, chewing tobacco, and e-cigarettes. If you need help quitting, ask your health care provider. General instructions  Keep track of your blood pressure. Report changes in your blood pressure as told by your health care provider.  Stay up to date with immunizations. Ask your health care provider which immunizations you need.  Keep all follow-up visits as told by your health care provider. This is important. Where to find more information:  American Association of Kidney Patients: ResidentialShow.is  [employer]: www.kidney.org  American Kid[employer]: FightingMatch.com.ee  [employer] [facility]: ? www.lifeoptions.org ? www.kidneyschool.org Contact a health care provider if:  Your symptoms get worse.  You develop new symptoms. Get help right away if:  You develop symptoms of worsening kidney disease, which include: ? Headaches. ? Abnormally dark or light skin. ? Easy bruising. ? Frequent hiccups. ? Chest pain. ? Shortness of breath. ? End of menstruation in women. ? Seizures. ? Confusion or altered mental status. ? Abdominal or back pain. ? Itchiness.  You have a fever.  Your body is producing less urine.  You have pain or bleeding when you  urinate. Summary  Acute kidney injury is a sudden worsening of kidney function.  Acute kidney injury can be caused by problems with blood flow to the kidneys, direct damage to the kidneys, and sudden blockage of urine flow.  Symptoms of this condition may not be obvious until it becomes severe. Symptoms may include edema, lethargy, confusion, nausea or vomiting, and problems passing urine.  This condition can usually be diagnosed with blood tests, urine tests, and imaging tests. Sometimes a kidney biopsy is done to diagnose this condition.  Treatment for this condition often involves treating the underlying cause. It is treated with fluids, medicines, dialysis, diet changes, or surgery. This information is not intended to replace advice given to you by your health care provider. Make sure you discuss any questions you have with your health care provider. Document Released: 12/17/2010 Document Revised: 10/03/2016 Document Reviewed: 05/24/2016 Elsevier Interactive Patient Education  [employer].

## 2017-09-08 IMAGING — US US ART/VEN ABD/PELV/SCROTUM DOPPLER LTD
1 series · 13 of 25 positions shown · non-contrast
Comparison: Abdominal CT dated 12/07/2016

CLINICAL DATA: 45-year-old female with left lower quadrant pain.
History of hysterectomy.

EXAM:
TRANSABDOMINAL AND TRANSVAGINAL ULTRASOUND OF PELVIS
DOPPLER ULTRASOUND OF OVARIES
TECHNIQUE: Both transabdominal and transvaginal ultrasound examinations of the
pelvis were performed. Transabdominal technique was performed for
global imaging of the pelvis including uterus, ovaries, adnexal
regions, and pelvic cul-de-sac.
It was necessary to proceed with endovaginal exam following the
transabdominal exam to visualize the ovaries. Color and duplex
Doppler ultrasound was utilized to evaluate blood flow to the
ovaries.

[Series 1: us art/ven abd/pelv/scrotum doppler ltd · 0.24mm/px · 13 of 98 slices shown]
[im 1/98]
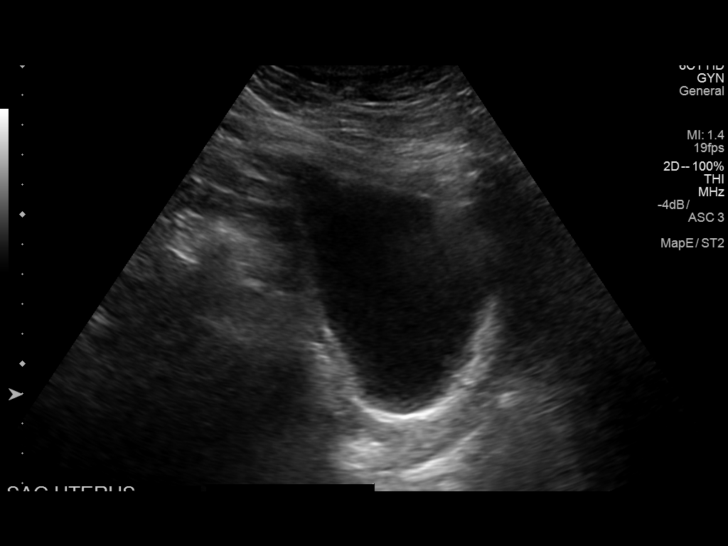
[im 9/98]
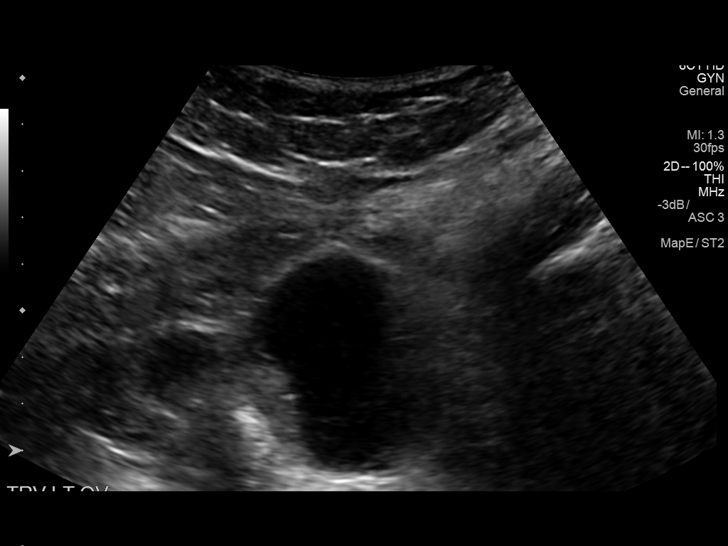
[im 17/98]
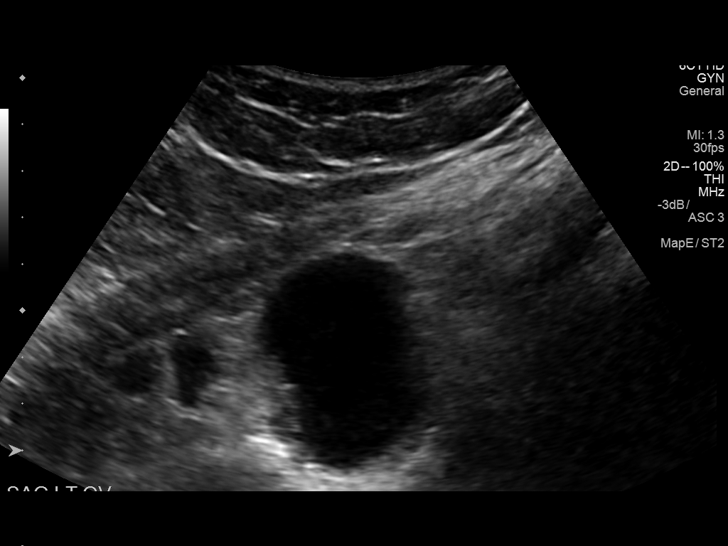
[im 25/98]
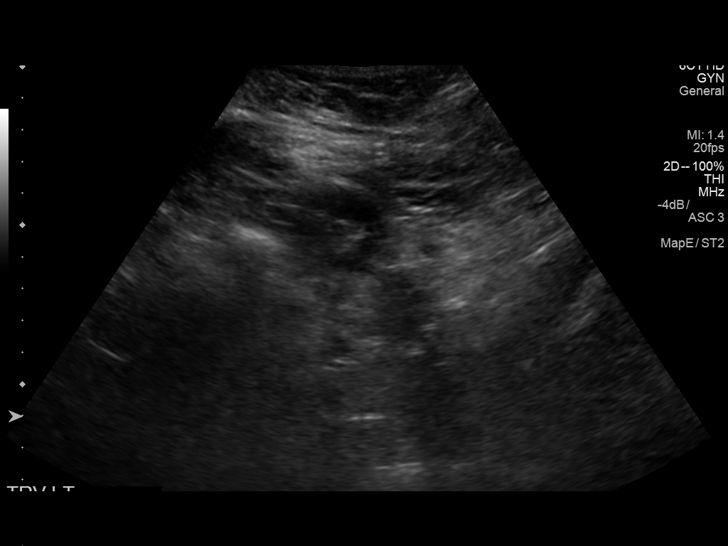
[im 33/98]
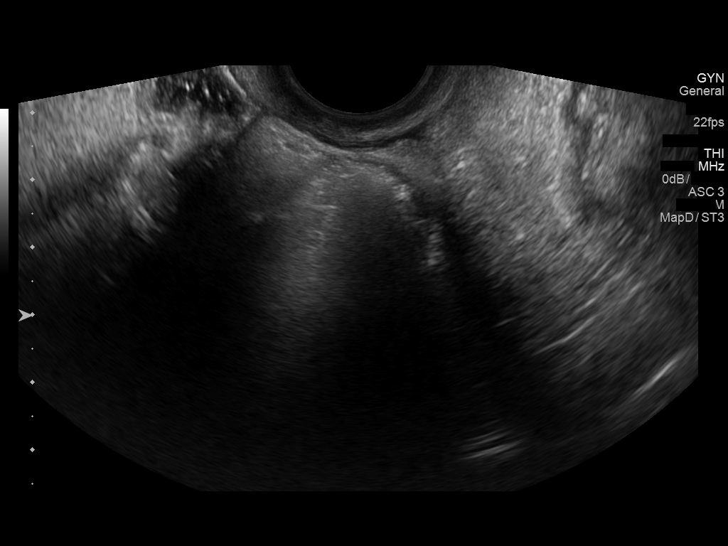
[im 41/98]
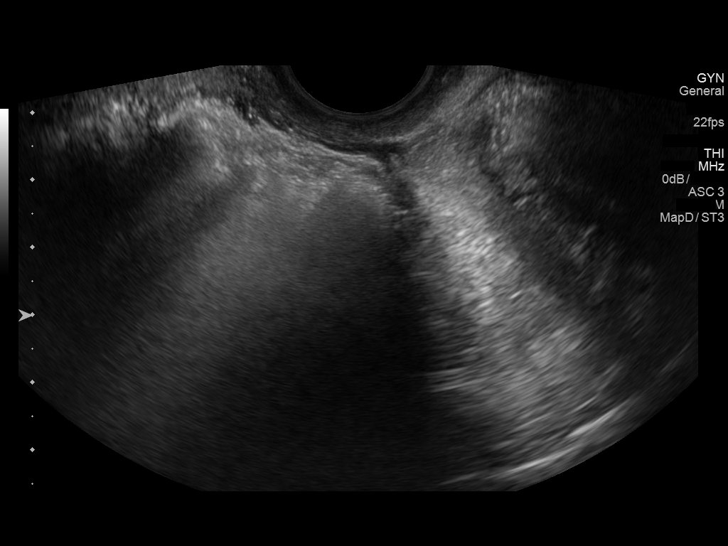
[im 49/98]
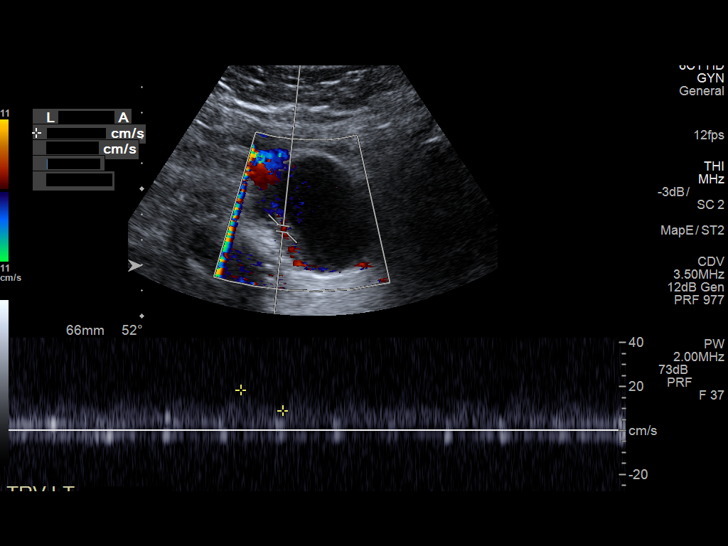
[im 57/98]
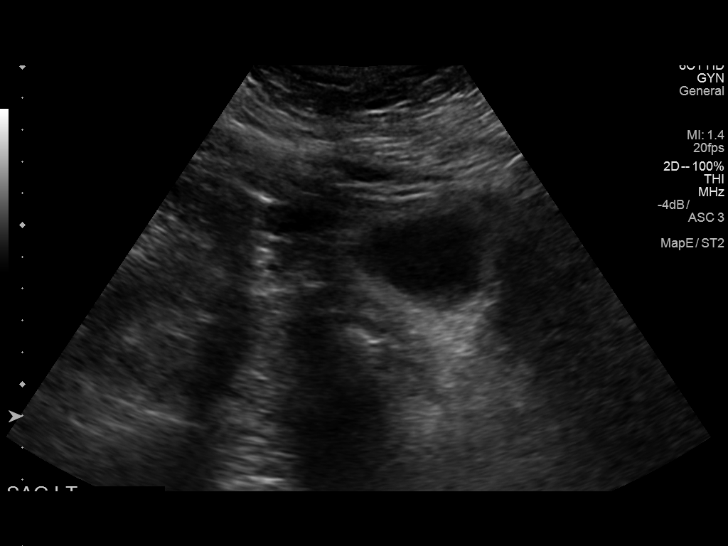
[im 65/98]
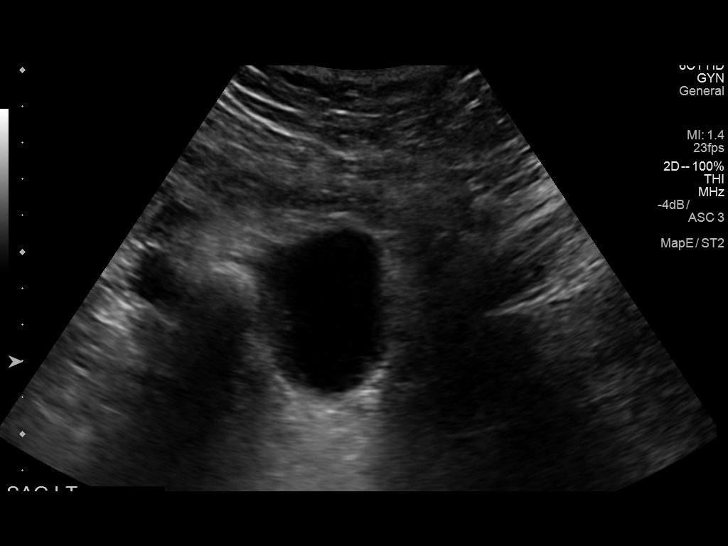
[im 73/98]
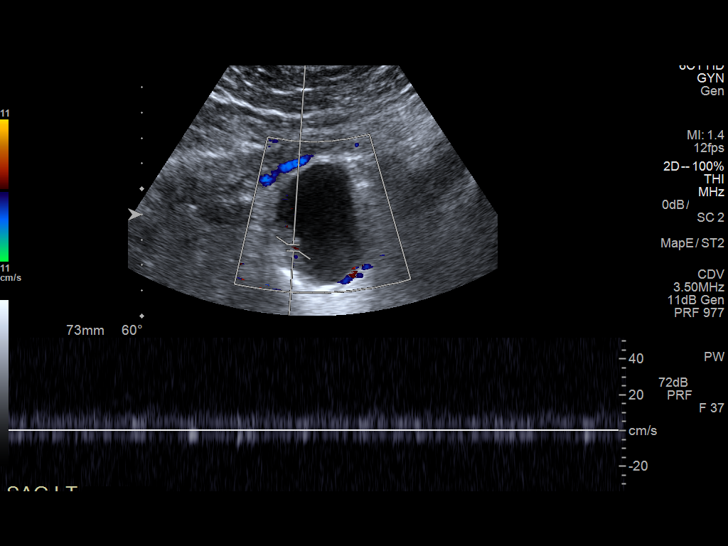
[im 81/98]
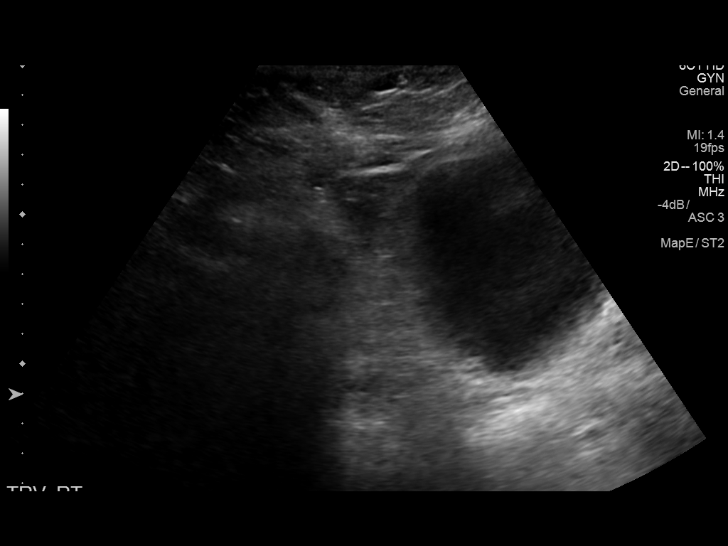
[im 89/98]
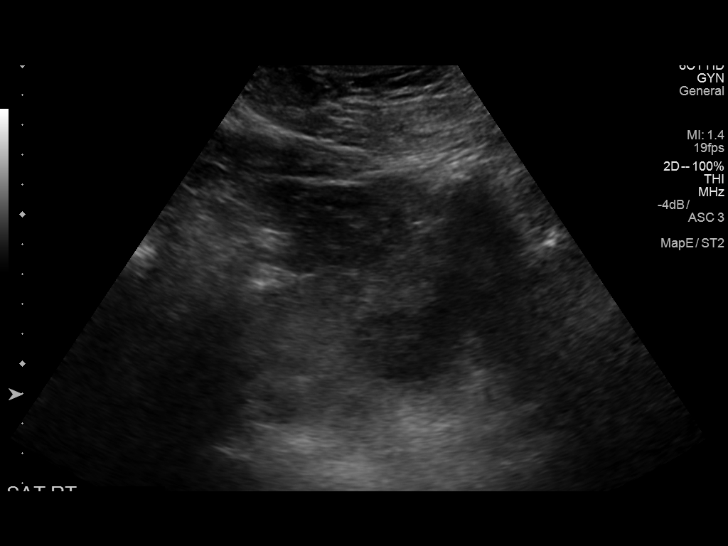
[im 98/98]
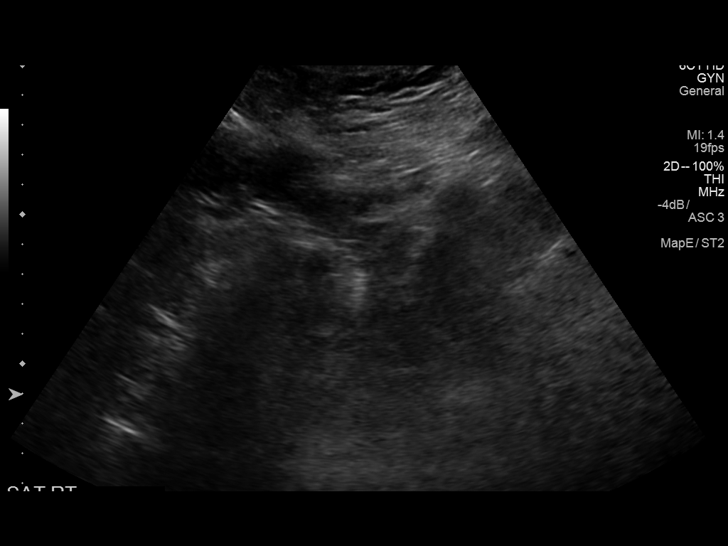

[13 of 25 positions shown; findings below may reference images not displayed]

FINDINGS: Uterus

Hysterectomy.

Endometrium

Hysterectomy.

Right ovary

Not seen.

Left ovary

Measurements: 5.0 x 3.8 x 3.9 cm.. There is a 4.6 x 2.8 x 2.9 cm
cyst in the left adnexa corresponding to the cyst seen on the CT.
Doppler images demonstrate presence of arterial and venous flow to
the periphery of this cyst, likely to the ovarian parenchyma. No
definite intracystic solid component or mural solid nodule noted. No
flow within the cyst. This likely represents a physiologic cyst.
Follow-up with ultrasound in 8-12 weeks recommended to document
resolution. If the cyst persist on follow-up imaging further
characterization with MRI is recommended.

Other findings

No abnormal free fluid.
IMPRESSION: 1. Left ovarian cyst. Follow-up with ultrasound in 8-12 weeks
recommended.
2. Doppler demonstrated arterial and venous flow to the left ovary.
3. Hysterectomy.
4. Nonvisualization of right ovary.

## 2017-09-29 ENCOUNTER — Other Ambulatory Visit: Payer: Self-pay | Admitting: Family Medicine

## 2017-10-20 ENCOUNTER — Encounter: Payer: Self-pay | Admitting: Family Medicine

## 2017-10-20 DIAGNOSIS — E668 Other obesity: Secondary | ICD-10-CM

## 2017-10-20 NOTE — Telephone Encounter (Signed)
Wt Readings from Last 3 Encounters:  08/25/17 193 lb 9.6 oz (87.8 kg)  08/15/17 201 lb 15.1 oz (91.6 kg)  08/11/17 202 lb 6 oz (91.8 kg)

## 2017-11-06 ENCOUNTER — Encounter: Payer: Self-pay | Admitting: Registered"

## 2017-11-06 ENCOUNTER — Encounter: Payer: Self-pay | Admitting: Family Medicine

## 2017-11-06 ENCOUNTER — Encounter: Payer: 59 | Attending: Family Medicine | Admitting: Registered"

## 2017-11-06 DIAGNOSIS — Z713 Dietary counseling and surveillance: Secondary | ICD-10-CM | POA: Insufficient documentation

## 2017-11-06 DIAGNOSIS — E669 Obesity, unspecified: Secondary | ICD-10-CM

## 2017-11-06 NOTE — Patient Instructions (Addendum)
-   Aim to drink more water. Order water when eating out.   - Aim to have 3 meals a day.   - Make meals well-balanced including protein, carbohydrates, fruit and vegetables.

## 2017-11-06 NOTE — Progress Notes (Signed)
Medical Nutrition Therapy:  Appt start time: 2:00 end time:  3:00.  Pt expectations: motivation, heart healthy ideas  Assessment:  Primary concerns today: Pt is talkative. Pt states she wants to develop a better diet, help with losing weight, recipes to provide more than one meal. Pt states she lives alone and has a 48 year old who lives with his father. Pt states she and a neighbor alternate preparing meals during the week. Pt states she does not like to clean up after cooking. Pt states she is impatient and will go through drive-thru so that she does not have to wait. Pt states she is supposed to be drinking decaf options to help with anxiety. Pt states she has financial limitations. Pt reports sleeping 6-7 hours a night and works M-F 8-4:30pm.   Pt states she does a lot of stress eating. Pt states she sits all day at work. Pt states she was in hospital for tonsillectomy and developed pneumonia, sepsis, and  Dehydration. Pt states she was in the hospital for 4.5 days. Pt states she is going through anxiety and depression; currently seeing a counselor. Pt states she gets physically tired from being depressed during the day and when she gets home she is too tired to manage household duties.   Preferred Learning Style:    No preference indicated   Learning Readiness:   Ready  Change in progress   MEDICATIONS: See list   DIETARY INTAKE:  Usual eating pattern includes 2 meals and 2-3 snacks per day.  Everyday foods include fast food, convenience foods, honey bun, corn, broccoli and cheese, steak, chicken, or pizza.  Avoided foods include diet soda (does not like after taste of artificial sweeteners).  24-hr recall:  B ( AM): skips; 2 eggs, toast, pork chop, hashbrown   Snk (10 AM): vending machine - honey bun L ( PM): steak and cheese sub + fries or [employer] salad or chopped steak + potatoes or green beans Snk (3 PM): sometimes chocolate candy bar or fruit D ( PM): baked chicken or  pizza or steak + corn + broccoli and cheese Snk ( PM):  frosted flakes or honey nut cheerios  Beverages: milk, coffee, sweet tea, coke, wine cooler or shot (sometimes), water  Usual physical activity: ADLs  Estimated energy needs: 1800 calories 200 g carbohydrates 135 g protein 50 g fat  Progress Towards Goal(s):  In progress.   Nutritional Diagnosis:  NB-1.1 Food and nutrition-related knowledge deficit As related to lack of prior nutrition-related education.  As evidenced by pt verbalizes no prior education provided on how to apply food and nutrition related information.    Intervention:  Nutrition education and counseling. Pt was educated and counseled the benefits of eating well-balanced meals throughout her day, not skipping meals, and drinking more water. Pt was counseled on ways to plan meals ahead of time to reduce stopping for something quick and easy. Pt was counseled on ways to increase fruit and vegetable intake with meals and snacks. Pt was in agreement with goals set.  Goals: - Aim to drink more water. Order water when eating out.  - Aim to have 3 meals a day.  - Make meals well-balanced including protein, carbohydrates, fruit and vegetables.   Teaching Method Utilized:  Visual Auditory Hands on  Handouts given during visit include:  Bake, Broil, Grill  My Plate  Barriers to learning/adherence to lifestyle change: finances, contemplative stage of change  Demonstrated degree of understanding via:  Teach Back  Monitoring/Evaluation:  Dietary intake, exercise, and body weight in 1 month(s).

## 2017-11-12 ENCOUNTER — Encounter (HOSPITAL_COMMUNITY): Payer: Self-pay | Admitting: Emergency Medicine

## 2017-11-12 ENCOUNTER — Ambulatory Visit: Payer: Self-pay | Admitting: *Deleted

## 2017-11-12 ENCOUNTER — Emergency Department (HOSPITAL_COMMUNITY)
Admission: EM | Admit: 2017-11-12 | Discharge: 2017-11-13 | Disposition: A | Discharge status: Home / Self Care | Payer: 59 | Attending: Emergency Medicine | Admitting: Emergency Medicine

## 2017-11-12 ENCOUNTER — Emergency Department (HOSPITAL_COMMUNITY): Payer: 59

## 2017-11-12 DIAGNOSIS — R2243 Localized swelling, mass and lump, lower limb, bilateral: Secondary | ICD-10-CM | POA: Diagnosis not present

## 2017-11-12 DIAGNOSIS — R911 Solitary pulmonary nodule: Secondary | ICD-10-CM | POA: Insufficient documentation

## 2017-11-12 DIAGNOSIS — J189 Pneumonia, unspecified organism: Secondary | ICD-10-CM

## 2017-11-12 DIAGNOSIS — J181 Lobar pneumonia, unspecified organism: Secondary | ICD-10-CM | POA: Diagnosis not present

## 2017-11-12 DIAGNOSIS — R0602 Shortness of breath: Secondary | ICD-10-CM | POA: Diagnosis not present

## 2017-11-12 DIAGNOSIS — M7989 Other specified soft tissue disorders: Secondary | ICD-10-CM

## 2017-11-12 DIAGNOSIS — I10 Essential (primary) hypertension: Secondary | ICD-10-CM | POA: Diagnosis not present

## 2017-11-12 DIAGNOSIS — Z79899 Other long term (current) drug therapy: Secondary | ICD-10-CM | POA: Diagnosis not present

## 2017-11-12 DIAGNOSIS — R600 Localized edema: Secondary | ICD-10-CM | POA: Diagnosis present

## 2017-11-12 LAB — I-STAT TROPONIN, ED: TROPONIN I, POC: 0 ng/mL (ref 0.00–0.08)

## 2017-11-12 LAB — BASIC METABOLIC PANEL
Anion gap: 8 (ref 5–15)
BUN: 9 mg/dL (ref 6–20)
CALCIUM: 8.7 mg/dL — AB (ref 8.9–10.3)
CHLORIDE: 106 mmol/L (ref 101–111)
CO2: 23 mmol/L (ref 22–32)
CREATININE: 0.74 mg/dL (ref 0.44–1.00)
GFR calc Af Amer: >60 mL/min (ref >60)
GFR calc non Af Amer: >60 mL/min (ref >60)
Glucose, Bld: 84 mg/dL (ref 65–99)
Potassium: 3.2 mmol/L — ABNORMAL LOW (ref 3.5–5.1)
Sodium: 137 mmol/L (ref 135–145)

## 2017-11-12 LAB — CBC
HCT: 40.2 % (ref 36.0–46.0)
Hemoglobin: 13.2 g/dL (ref 12.0–15.0)
MCH: 29.5 pg (ref 26.0–34.0)
MCHC: 32.8 g/dL (ref 30.0–36.0)
MCV: 89.9 fL (ref 78.0–100.0)
PLATELETS: 413 10*3/uL — AB (ref 150–400)
RBC: 4.47 MIL/uL (ref 3.87–5.11)
RDW: 13.1 % (ref 11.5–15.5)
WBC: 14.3 10*3/uL — ABNORMAL HIGH (ref 4.0–10.5)

## 2017-11-12 LAB — I-STAT BETA HCG BLOOD, ED (MC, WL, AP ONLY): I-stat hCG, quantitative: <5 m[IU]/mL (ref <5)

## 2017-11-12 LAB — BRAIN NATRIURETIC PEPTIDE: B Natriuretic Peptide: 43.8 pg/mL (ref 0.0–100.0)

## 2017-11-12 NOTE — Telephone Encounter (Signed)
Pt states she has been experiencing swelling in right arm which pt states is unusual, hands and  bilateral legs, with left being greater than the right off and on for the past month. Pt states she has never been treated for the swelling before. Pt states the swelling is from the knee down on bilateral legs.Pt rates pain at 10 and states that it hurts with walking and has shortness of breath with walking as well. Pt describes her legs as feeling tight and denies any redness or fever. Pt believes that swelling started with taking Risperdal. Pt states she also read that taking pramipexole could cause CHF so she stopped taking the medication. Pt advised to go to the ED for treatment of current symptoms. Pt verbalized understanding.  Reason for Disposition . [1] Difficulty breathing with exertion (e.g., walking) AND [2] new onset or worsening  Answer Assessment - Initial Assessment Questions 1. ONSET: "When did the swelling start?" (e.g., minutes, hours, days)     On and off for the past month 2. LOCATION: "What part of the leg is swollen?"  "Are both legs swollen or just one leg?"     Both legs, left greater than right and progressively 3. SEVERITY: "How bad is the swelling?" (e.g., localized; mild, moderate, severe)  - Localized - small area of swelling localized to one leg  - MILD pedal edema - swelling limited to foot and ankle, pitting edema < 1/4 inch (6 mm) deep, rest and elevation eliminate most or all swelling  - MODERATE edema - swelling of lower leg to knee, pitting edema > 1/4 inch (6 mm) deep, rest and elevation only partially reduce swelling  - SEVERE edema - swelling extends above knee, facial or hand swelling present      Moderate and feels tight 4. REDNESS: "Does the swelling look red or infected?"     No 5. PAIN: "Is the swelling painful to touch?" If so, ask: "How painful is it?"   (Scale 1-10; mild, moderate or severe)     10 6. FEVER: "Do you have a fever?" If so, ask: "What is it,  how was it measured, and when did it start?"      No 7. CAUSE: "What do you think is causing the leg swelling?"     Possibly taking risperadol 8. MEDICAL HISTORY: "Do you have a history of heart failure, kidney disease, liver failure, or cancer?"     No 9. RECURRENT SYMPTOM: "Have you had leg swelling before?" If so, ask: "When was the last time?" "What happened that time?"     On and off with  10. OTHER SYMPTOMS: "Do you have any other symptoms?" (e.g., chest pain, difficulty breathing)       SOB with walking 11. PREGNANCY: "Is there any chance you are pregnant?" "When was your last menstrual period?"       No, hysterectomy 2 year ago  Protocols used: LEG SWELLING AND EDEMA-A-AH

## 2017-11-12 NOTE — ED Triage Notes (Addendum)
Pt presents with sob and leg swelling bilaterally x 1 mo; pt states she gets fatigued and more SOB with activity; pt very anxious

## 2017-11-13 ENCOUNTER — Emergency Department (HOSPITAL_COMMUNITY): Payer: 59

## 2017-11-13 LAB — D-DIMER, QUANTITATIVE: D-Dimer, Quant: 0.61 ug/mL-FEU — ABNORMAL HIGH (ref 0.00–0.50)

## 2017-11-13 MED ORDER — IOPAMIDOL (ISOVUE-370) INJECTION 76%
INTRAVENOUS | Status: AC
  Administered 2017-11-13: 100 mL
  Filled 2017-11-13: qty 100

## 2017-11-13 MED ORDER — AMOXICILLIN-POT CLAVULANATE 875-125 MG PO TABS: 1.0000 | ORAL_TABLET | Freq: Two times a day (BID) | ORAL | 0 refills | Status: DC

## 2017-11-13 MED ORDER — FLUCONAZOLE 150 MG PO TABS: 150.0000 mg | ORAL_TABLET | Freq: Once | ORAL | 0 refills | Status: AC

## 2017-11-13 NOTE — ED Notes (Signed)
Patient transported to CT 

## 2017-11-13 NOTE — ED Provider Notes (Signed)
[facility] EMERGENCY DEPARTMENT Provider Note   CSN: [id number] Arrival date & time: 11/12/17  1830     History   Chief Complaint Chief Complaint  Patient presents with  . Leg Swelling  . Shortness of Breath    HPI Helen Greene is a 47 y.o. female.  Patient presents to the emergency department with a chief complaint of shortness of breath and bilateral lower extremity swelling.  She denies any chest pain, fever, chills, cough.  She states that her symptoms have been ongoing for the past month.  She reports that her symptoms have been gradually worsening.  She states that she now has pain in her bilateral lower extremities.  Questions whether this is medication related and related to her Risperdal.  She denies any history of PE or DVT.  She states that she was recently in the hospital after having had tonsillectomy and subsequently developing sepsis about 3 months ago.  The history is provided by the patient. No language interpreter was used.    Past Medical History:  Diagnosis Date  . Allergy   . Anxiety   . Depression   . Elevated white blood cell count   . Elevated white blood cell count 09/25/2011  . GERD (gastroesophageal reflux disease)   . Headache    Migraine  . Hypertension   . Kidney stone   . Neuromuscular disorder (HCC)    carpel tunnel disease  . Pneumonia 08/15/2017  . Restless leg syndrome   . Shortness of breath dyspnea    with exertion  . Sleep apnea    does not have CPAP machine, mostly affects back sleeping was encouraged to sleep on her sides  . Tonsillar calculus     Patient Active Problem List   Diagnosis Date Noted  . AKI (acute kidney injury) (HCC) 08/15/2017  . Sepsis due to pneumonia (HCC) 08/15/2017  . Right sided abdominal pain 08/15/2017  . History of tonsillectomy Feb 25,2019 08/15/2017  . Hypertension 08/15/2017  . Sleep apnea 08/15/2017  . Anxiety 08/15/2017  . Restless leg syndrome 08/15/2017  . Post-op  pneumonia 08/15/2017  . Morbid obesity with BMI of 40.0-44.9, adult (HCC) 08/15/2017  . Shock liver 08/15/2017  . S/P tonsillectomy 08/11/2017  . S/P total hysterectomy 09/14/2015  . Frequent headaches 03/17/2015  . RLS (restless legs syndrome) 03/17/2015  . HTN (hypertension) 05/19/2013  . Elevated white blood cell count 09/25/2011    Past Surgical History:  Procedure Laterality Date  . ABDOMINAL HYSTERECTOMY    . CHOLECYSTECTOMY    . COLONOSCOPY    . DILATION AND CURETTAGE OF UTERUS  1993  . ECTOPIC PREGNANCY SURGERY    . ESSURE TUBAL LIGATION    . LITHOTRIPSY    . ROBOTIC ASSISTED TOTAL HYSTERECTOMY WITH SALPINGECTOMY Left 09/14/2015   Procedure: ROBOTIC ASSISTED TOTAL HYSTERECTOMY WITH LEFT SALPINGECTOMY;  Surgeon: Noland Fordyce, MD;  Location: [facility];  Service: Gynecology;  Laterality: Left;  . TONSILLECTOMY N/A 08/11/2017   Procedure: TONSILLECTOMY;  Surgeon: Serena Colonel, MD;  Location: [facility];  Service: ENT;  Laterality: N/A;  . TONSILLECTOMY  07/2017  . UPPER GI ENDOSCOPY    . Greene TOOTH EXTRACTION       OB History    Gravida  3   Para  2   Term      Preterm      AB  1   Living  2     SAB      TAB  Ectopic  1   Multiple      Live Births               Home Medications    Prior to Admission medications   Medication Sig Start Date End Date Taking? Authorizing Provider  buPROPion (WELLBUTRIN XL) 300 MG 24 hr tablet Take 1 tablet (300 mg total) by mouth daily. 01/14/17   Doristine Bosworth, MD  clobetasol cream (TEMOVATE) 0.05 % Apply 1 application topically 2 (two) times daily. Thin application to left vulva. Patient not taking: Reported on 08/25/2017 04/08/17   Genia Del, MD  clotrimazole-betamethasone (LOTRISONE) cream Apply 1 application topically daily as needed (rash).  06/28/16   [provider]  cyclobenzaprine (FLEXERIL) 5 MG tablet Take 1 tablet (5 mg total) by mouth 3 (three) times daily as needed  for muscle spasms. Patient not taking: Reported on 08/25/2017 01/14/17   Doristine Bosworth, MD  escitalopram (LEXAPRO) 10 MG tablet TAKE 1 TABLET(10 MG) BY MOUTH DAILY 07/30/17   Doristine Bosworth, MD  folic acid (FOLVITE) 1 MG tablet Take 1 tablet (1 mg total) by mouth daily. 01/14/17   Doristine Bosworth, MD  hydrOXYzine (ATARAX/VISTARIL) 25 MG tablet Take 25 mg by mouth 3 (three) times daily as needed for anxiety or vomiting.    [provider]  ketorolac (TORADOL) 10 MG tablet Take 1 tablet (10 mg total) by mouth every 8 (eight) hours as needed for moderate pain or severe pain. Patient not taking: Reported on 11/06/2017 08/19/17   Noralee Stain, DO  lisinopril-hydrochlorothiazide (PRINZIDE,ZESTORETIC) 10-12.5 MG tablet TAKE 1 TABLET BY MOUTH DAILY 07/30/17   Doristine Bosworth, MD  mirabegron ER (MYRBETRIQ) 50 MG TB24 tablet Take 50 mg by mouth daily.    [provider]  nystatin (MYCOSTATIN) 100000 UNIT/ML suspension Use as directed 5 mLs (500,000 Units total) in the mouth or throat 2 (two) times daily as needed (for thrush). 08/19/17   Noralee Stain, DO  nystatin (MYCOSTATIN/NYSTOP) powder Apply topically 2 (two) times daily. Patient taking differently: Apply 1 g topically 2 (two) times daily as needed (RASH).  06/28/16   Doristine Bosworth, MD  pramipexole (MIRAPEX) 1.5 MG tablet TAKE 1 TABLET(1.5 MG) BY MOUTH AT BEDTIME 09/29/17   Doristine Bosworth, MD  promethazine (PHENERGAN) 25 MG suppository Place 1 suppository (25 mg total) rectally every 6 (six) hours as needed for nausea or vomiting. Patient not taking: Reported on 08/25/2017 08/11/17   Serena Colonel, MD    Family History Family History  Problem Relation Age of Onset  . Hypertension Mother   . Heart failure Mother   . Cancer Mother   . Heart disease Mother   . Heart disease Brother   . Hypertension Brother   . Cancer Father        Leukemia  . Hypertension Father   . Cancer Sister   . Diabetes Other   . Colon cancer Neg Hx     . Esophageal cancer Neg Hx   . Rectal cancer Neg Hx   . Stomach cancer Neg Hx     Social History Social History   Tobacco Use  . Smoking status: Never Smoker  . Smokeless tobacco: Never Used  Substance Use Topics  . Alcohol use: Yes    Alcohol/week: 0.0 oz    Comment: social  . Drug use: No     Allergies   Bee venom; Enablex [darifenacin hydrobromide]; Erythromycin; Macrobid [nitrofurantoin monohyd macro]; and Nitrofuran derivatives  Review of Systems Review of Systems  All other systems reviewed and are negative.    Physical Exam Updated Vital Signs BP 123/79   Pulse 85   Temp 97.8 F (36.6 C) (Oral)   Resp 18   Ht 4' 11.5" (1.511 m)   Wt 94.3 kg (208 lb)   LMP 09/07/2015 (Exact Date)   SpO2 96%   BMI 41.31 kg/m   Physical Exam  Constitutional: She is oriented to person, place, and time. She appears well-developed and well-nourished.  HENT:  Head: Normocephalic and atraumatic.  Eyes: Pupils are equal, round, and reactive to light. Conjunctivae and EOM are normal.  Neck: Normal range of motion. Neck supple.  Cardiovascular: Normal rate and regular rhythm. Exam reveals no gallop and no friction rub.  No murmur heard. Pulmonary/Chest: Effort normal and breath sounds normal. No respiratory distress. She has no wheezes. She has no rales. She exhibits no tenderness.  Clear to auscultation bilaterally  Abdominal: Soft. Bowel sounds are normal. She exhibits no distension and no mass. There is no tenderness. There is no rebound and no guarding.  Musculoskeletal: Normal range of motion. She exhibits no tenderness.       Right lower leg: She exhibits edema.       Left lower leg: She exhibits edema.  Bilateral lower extremity edema  Neurological: She is alert and oriented to person, place, and time.  Skin: Skin is warm and dry.  Psychiatric: She has a normal mood and affect. Her behavior is normal. Judgment and thought content normal.  Nursing note and vitals  reviewed.    ED Treatments / Results  Labs (all labs ordered are listed, but only abnormal results are displayed) Labs Reviewed  BASIC METABOLIC PANEL - Abnormal; Notable for the following components:      Result Value   Potassium 3.2 (*)    Calcium 8.7 (*)    All other components within normal limits  CBC - Abnormal; Notable for the following components:   WBC 14.3 (*)    Platelets 413 (*)    All other components within normal limits  BRAIN NATRIURETIC PEPTIDE  D-DIMER, QUANTITATIVE (NOT AT [facility])  I-STAT TROPONIN, ED  I-STAT BETA HCG BLOOD, ED ([facility], [facility], [facility] ONLY)    EKG None ED ECG REPORT  I personally interpreted this EKG   Date: 11/13/2017   Rate: 84  Rhythm: normal sinus rhythm  QRS Axis: normal  Intervals: normal  ST/T Wave abnormalities: normal  Conduction Disutrbances:none  Narrative Interpretation:   Old EKG Reviewed: none available   Radiology Dg Chest 2 View  Result Date: 11/12/2017 CLINICAL DATA:  Hand and leg swelling for 1 month. EXAM: CHEST - 2 VIEW COMPARISON:  08/15/2017 FINDINGS: The heart size and mediastinal contours are within normal limits. Both lungs are clear. The visualized skeletal structures are unremarkable. IMPRESSION: No active cardiopulmonary disease. Electronically Signed   By: Elige Ko   On: 11/12/2017 21:09    Procedures Procedures (including critical care time)  Medications Ordered in ED Medications - No data to display   Initial Impression / Assessment and Plan / ED Course  I have reviewed the triage vital signs and the nursing notes.  Pertinent labs & imaging results that were available during my care of the patient were reviewed by me and considered in my medical decision making (see chart for details).     Patient with shortness of breath and bilateral lower extremity swelling.  Laboratory work-up ordered in triage is mostly  reassuring.  Troponin is negative.  Patient is not pregnant.  Potassium is slightly low at 3.2.   She is not anemic.  She does have a nonspecific leukocytosis of 14.3.  BNP is 43.8.  Chest x-ray is negative.  EKG unremarkable for any ischemic changes.  She was mildly tachycardic on arrival.  Will check d-dimer.  If negative, plan for discharge and PCP follow-up.  D-dimer is elevated, will proceed with CT angio.  CT shows right upper lobe opacity.  She does have a leukocytosis.  Will treat with Augmentin.  No evidence of PE.  Recommend close follow-up for leg swelling.  Discussed pulmonary nodules with patient.  Final Clinical Impressions(s) / ED Diagnoses   Final diagnoses:  Leg swelling  Community acquired pneumonia of right upper lobe of lung [facility])  Pulmonary nodule    ED Discharge Orders        Ordered    amoxicillin-clavulanate (AUGMENTIN) 875-125 MG tablet  Every 12 hours     11/13/17 0528    fluconazole (DIFLUCAN) 150 MG tablet   Once     11/13/17 0536       Roxy Horseman, PA-C 11/13/17 0544    Palumbo, April, MD 11/13/17 [phone]

## 2017-11-13 NOTE — ED Notes (Signed)
CT made aware of pt's new PIV access.

## 2017-11-13 NOTE — Discharge Instructions (Addendum)
Please use compression stockings for the leg swelling.  Please follow-up with your doctor.  Your CT scan showed possible pneumonia.  Please take antibiotics as directed.  It also showed a pulmonary nodule, you will need to discuss this with your doctor.  Usually this is something that is rechecked at a later point in time.

## 2017-11-17 ENCOUNTER — Other Ambulatory Visit: Payer: Self-pay

## 2017-11-17 ENCOUNTER — Ambulatory Visit (INDEPENDENT_AMBULATORY_CARE_PROVIDER_SITE_OTHER): Payer: 59 | Admitting: Family Medicine

## 2017-11-17 ENCOUNTER — Encounter: Payer: Self-pay | Admitting: Family Medicine

## 2017-11-17 VITALS — BP 124/87 | HR 93 | Temp 99.4°F | Resp 16 | Ht 59.5 in | Wt 202.6 lb

## 2017-11-17 DIAGNOSIS — G2581 Restless legs syndrome: Secondary | ICD-10-CM | POA: Diagnosis not present

## 2017-11-17 DIAGNOSIS — F411 Generalized anxiety disorder: Secondary | ICD-10-CM | POA: Diagnosis not present

## 2017-11-17 DIAGNOSIS — I10 Essential (primary) hypertension: Secondary | ICD-10-CM | POA: Diagnosis not present

## 2017-11-17 DIAGNOSIS — J189 Pneumonia, unspecified organism: Secondary | ICD-10-CM

## 2017-11-17 DIAGNOSIS — J181 Lobar pneumonia, unspecified organism: Secondary | ICD-10-CM | POA: Diagnosis not present

## 2017-11-17 DIAGNOSIS — R0609 Other forms of dyspnea: Secondary | ICD-10-CM

## 2017-11-17 NOTE — Progress Notes (Signed)
Chief Complaint  Patient presents with  . pneumonia/swollen legs    follow up    HPI   She reports that she was having severe swelling in her legs and went to the ER 11/12/17 because she was swollen and not breathe. She states that she stopped the risperidone She was given a CT angiogram and was diagnosed with RUL pneumonia in the same area that she previously had pneumonia 08/15/2017. She is now on Augmentin.  She reports that she is still not breathing better.  Since her leg swelling has gone down she is feeling slightly better.   Essential Hypertension BP Readings from Last 3 Encounters:  11/17/17 124/87  11/13/17 112/72  08/25/17 130/80   She avoid salty foods She does not take meds that worsen bp She does not exercise regularly She is typically stressed She denies chest pain, palpitations but has SOB and fatigue She is on lisinopril-hctz and denies muscle cramps or persistent cough  Anxiety and depression She reports that she has been worried since disharge that she is having the sweling in the legs due to medication side effects She is currently taking wellbutrin, lexapro and was on risperidone She is still on mirapex for RLS  Depression screen [facility] 2/9 11/17/2017 11/17/2017 11/06/2017 08/25/2017 01/14/2017  Decreased Interest 2 2 0 2 0  Down, Depressed, Hopeless 1 1 2 1  0  PHQ - 2 Score 3 3 2 3  0  Altered sleeping - 0 0 0 -  Tired, decreased energy - - 1 0 -  Change in appetite - - 0 0 -  Feeling bad or failure about yourself  - - 3 1 -  Trouble concentrating - - 0 1 -  Moving slowly or fidgety/restless - - 3 1 -  Suicidal thoughts - - 0 0 -  PHQ-9 Score - 3 9 6  -  Difficult doing work/chores - - Very difficult Somewhat difficult -    Past Medical History:  Diagnosis Date  . Allergy   . Anxiety   . Depression   . Elevated white blood cell count   . Elevated white blood cell count 09/25/2011  . GERD (gastroesophageal reflux disease)   . Headache    Migraine  .  Hypertension   . Kidney stone   . Neuromuscular disorder (HCC)    carpel tunnel disease  . Pneumonia 08/15/2017  . Restless leg syndrome   . Shortness of breath dyspnea    with exertion  . Sleep apnea    does not have CPAP machine, mostly affects back sleeping was encouraged to sleep on her sides  . Tonsillar calculus     Current Outpatient Medications  Medication Sig Dispense Refill  . amoxicillin-clavulanate (AUGMENTIN) 875-125 MG tablet Take 1 tablet by mouth every 12 (twelve) hours. 14 tablet 0  . buPROPion (WELLBUTRIN XL) 300 MG 24 hr tablet Take 1 tablet (300 mg total) by mouth daily. 90 tablet 1  . clobetasol cream (TEMOVATE) 0.05 % Apply 1 application topically 2 (two) times daily. Thin application to left vulva. 30 g 0  . cyclobenzaprine (FLEXERIL) 5 MG tablet Take 1 tablet (5 mg total) by mouth 3 (three) times daily as needed for muscle spasms. 90 tablet 6  . escitalopram (LEXAPRO) 10 MG tablet TAKE 1 TABLET(10 MG) BY MOUTH DAILY 90 tablet 0  . folic acid (FOLVITE) 1 MG tablet Take 1 tablet (1 mg total) by mouth daily. 90 tablet 1  . hydrOXYzine (VISTARIL) 25 MG capsule Take 75 mg  by mouth at bedtime.   2  . lisinopril-hydrochlorothiazide (PRINZIDE,ZESTORETIC) 10-12.5 MG tablet TAKE 1 TABLET BY MOUTH DAILY 90 tablet 0  . mirabegron ER (MYRBETRIQ) 50 MG TB24 tablet Take 50 mg by mouth daily.    Marland Kitchen nystatin (MYCOSTATIN) 100000 UNIT/ML suspension Use as directed 5 mLs (500,000 Units total) in the mouth or throat 2 (two) times daily as needed (for thrush). 60 mL 0  . nystatin (MYCOSTATIN/NYSTOP) powder Apply topically 2 (two) times daily. 15 g 1  . buPROPion (WELLBUTRIN XL) 150 MG 24 hr tablet Take 450 mg by mouth at bedtime.   1  . ketorolac (TORADOL) 10 MG tablet Take 1 tablet (10 mg total) by mouth every 8 (eight) hours as needed for moderate pain or severe pain. (Patient not taking: Reported on 11/06/2017) 20 tablet 0  . pramipexole (MIRAPEX) 1.5 MG tablet TAKE 1 TABLET(1.5 MG) BY  MOUTH AT BEDTIME 90 tablet 0  . promethazine (PHENERGAN) 25 MG suppository Place 1 suppository (25 mg total) rectally every 6 (six) hours as needed for nausea or vomiting. (Patient not taking: Reported on 11/17/2017) 12 suppository 1  . risperiDONE (RISPERDAL) 0.25 MG tablet Take 0.5 mg by mouth at bedtime.  1   No current facility-administered medications for this visit.     Allergies:  Allergies  Allergen Reactions  . Bee Venom Hives and Swelling  . Enablex [Darifenacin Hydrobromide] Other (See Comments)    Phlebitis.  . Erythromycin Hives  . Macrobid [Nitrofurantoin Monohyd Macro] Nausea And Vomiting  . Nitrofuran Derivatives Nausea And Vomiting    Past Surgical History:  Procedure Laterality Date  . ABDOMINAL HYSTERECTOMY    . CHOLECYSTECTOMY    . COLONOSCOPY    . DILATION AND CURETTAGE OF UTERUS  1993  . ECTOPIC PREGNANCY SURGERY    . ESSURE TUBAL LIGATION    . LITHOTRIPSY    . ROBOTIC ASSISTED TOTAL HYSTERECTOMY WITH SALPINGECTOMY Left 09/14/2015   Procedure: ROBOTIC ASSISTED TOTAL HYSTERECTOMY WITH LEFT SALPINGECTOMY;  Surgeon: Noland Fordyce, MD;  Location: [facility];  Service: Gynecology;  Laterality: Left;  . TONSILLECTOMY N/A 08/11/2017   Procedure: TONSILLECTOMY;  Surgeon: Serena Colonel, MD;  Location: [facility];  Service: ENT;  Laterality: N/A;  . TONSILLECTOMY  07/2017  . UPPER GI ENDOSCOPY    . WISDOM TOOTH EXTRACTION      Social History   Socioeconomic History  . Marital status: Divorced    Spouse name: Not on file  . Number of children: Not on file  . Years of education: Not on file  . Highest education level: Not on file  Occupational History  . Not on file  Social Needs  . Financial resource strain: Not on file  . Food insecurity:    Worry: Not on file    Inability: Not on file  . Transportation needs:    Medical: Not on file    Non-medical: Not on file  Tobacco Use  . Smoking status: Never Smoker  . Smokeless tobacco: Never Used    Substance and Sexual Activity  . Alcohol use: Yes    Alcohol/week: 0.0 oz    Comment: social  . Drug use: No  . Sexual activity: Not Currently    Partners: Male    Comment: 1ST intercourse- 18, partners-  2   Lifestyle  . Physical activity:    Days per week: Not on file    Minutes per session: Not on file  . Stress: Not on file  Relationships  .  Social connections:    Talks on phone: Not on file    Gets together: Not on file    Attends religious service: Not on file    Active member of club or organization: Not on file    Attends meetings of clubs or organizations: Not on file    Relationship status: Not on file  Other Topics Concern  . Not on file  Social History Narrative  . Not on file    Family History  Problem Relation Age of Onset  . Hypertension Mother   . Heart failure Mother   . Cancer Mother   . Heart disease Mother   . Heart disease Brother   . Hypertension Brother   . Cancer Father        Leukemia  . Hypertension Father   . Cancer Sister   . Diabetes Other   . Colon cancer Neg Hx   . Esophageal cancer Neg Hx   . Rectal cancer Neg Hx   . Stomach cancer Neg Hx      ROS Review of Systems See HPI Constitution: No fevers or chills No malaise No diaphoresis Skin: No rash or itching Eyes: no blurry vision, no double vision GU: no dysuria or hematuria Neuro: no dizziness or headaches  all others reviewed and negative   Objective: Vitals:   11/17/17 1632  BP: 124/87  Pulse: 93  Resp: 16  Temp: 99.4 F (37.4 C)  TempSrc: Oral  SpO2: 96%  Weight: 202 lb 9.6 oz (91.9 kg)  Height: 4' 11.5" (1.511 m)    Physical Exam  Constitutional: She is oriented to person, place, and time. She appears well-developed and well-nourished.  HENT:  Head: Normocephalic and atraumatic.  Eyes: Conjunctivae and EOM are normal.  Cardiovascular: Normal rate, regular rhythm and normal heart sounds.  No murmur heard. Pulmonary/Chest: Effort normal and breath  sounds normal. No stridor. No respiratory distress. She has no wheezes. She has no rales. She exhibits no tenderness.  She tied her shoe and got out of breath.    Neurological: She is alert and oriented to person, place, and time.  Skin: Skin is warm. Capillary refill takes less than 2 seconds.  Psychiatric: She has a normal mood and affect. Her behavior is normal. Judgment and thought content normal.       CLINICAL DATA:  Chronic shortness of breath and leg swelling. Generalized fatigue.  EXAM: CT ANGIOGRAPHY CHEST WITH CONTRAST  TECHNIQUE: Multidetector CT imaging of the chest was performed using the standard protocol during bolus administration of intravenous contrast. Multiplanar CT image reconstructions and MIPs were obtained to evaluate the vascular anatomy.  CONTRAST:  100 mL ISOVUE-370 IOPAMIDOL (ISOVUE-370) INJECTION 76%  COMPARISON:  Chest radiograph performed 11/12/2017, and CT of the chest performed 04/21/2012  FINDINGS: Cardiovascular: There is no evidence of significant pulmonary embolus.  The heart is normal in size. The thoracic aorta is grossly unremarkable. The great vessels are within normal limits.  Mediastinum/Nodes: No mediastinal lymphadenopathy is seen. No pericardial effusion is identified. Visualized peribronchial nodes remain borderline normal in size. The visualized portions of the thyroid gland are unremarkable. No axillary lymphadenopathy is seen.  Lungs/Pleura: Mild hazy opacity is noted at the posterior aspect of the right upper lobe, raising question for pneumonia. Small pleural based nodules are noted at the right middle lobe, measuring up to 5 mm in size (image 63 of 114). No pleural effusion or pneumothorax is seen.  Upper Abdomen: The visualized portions of the liver and  spleen are unremarkable.  Musculoskeletal: No acute osseous abnormalities are identified. The visualized musculature is unremarkable in  appearance.  Review of the MIP images confirms the above findings.  IMPRESSION: 1. No evidence of significant pulmonary embolus. 2. Mild hazy opacity at the posterior aspect of the right upper lung lobe, raising question for pneumonia. 3. Small pleural based nodules at the right middle lobe, measuring up to 5 mm in size. No follow-up needed if patient is low-risk (and has no known or suspected primary neoplasm). Non-contrast chest CT can be considered in 12 months if patient is high-risk. This recommendation follows the consensus statement: Guidelines for Management of Incidental Pulmonary Nodules Detected on CT Images: From the Fleischner Society 2017; Radiology 2017; 284:228-243.   Electronically Signed   By: Roanna RaiderJeffery  Chang M.D.   On: 11/13/2017 04:37  Assessment and Plan Helen Greene was seen today for pneumonia/swollen legs.  Diagnoses and all orders for this visit:  DOE (dyspnea on exertion) -     Ambulatory referral to Pulmonology  Community acquired pneumonia of right upper lobe of lung (HCC)- Advised follow up with Pulmonology due to persistent DOE and recurrent PNA Previous work up negative for Legionella and PE  some abnormalities noted on CT angiogram  ? Pneumonia ? Nodules Pt to follow up to discuss her dyspnea which is not heart failure and to discussed her CT angio -     Ambulatory referral to Pulmonology  RLS (restless legs syndrome)- continue mirapex Reviewed common side effects with patient  Generalized anxiety disorder- stop risperidone and continue other meds  Essential hypertension-  bp in good range, cpm, DASH diet especially since she gets LE edema      Carver Murakami A Creta LevinStallings

## 2017-11-17 NOTE — Patient Instructions (Signed)
     IF you received an x-ray today, you will receive an invoice from [facility]. Please contact [facility] at [phone] with questions or concerns regarding your invoice.   IF you received labwork today, you will receive an invoice from [facility]. Please contact [facility] at [phone] with questions or concerns regarding your invoice.   Our billing staff will not be able to assist you with questions regarding bills from these companies.  You will be contacted with the lab results as soon as they are available. The fastest way to get your results is to activate your My Chart account. Instructions are located on the last page of this paperwork. If you have not heard from us regarding the results in 2 weeks, please contact this office.     

## 2017-11-19 ENCOUNTER — Telehealth: Payer: Self-pay | Admitting: Family Medicine

## 2017-11-19 NOTE — Telephone Encounter (Signed)
Patient needs FMLA forms completed by Dr Creta LevinStallings for her last OV for swelling in her legs and trouble breathing. I have completed what I could from the OV notes and highlighted the areas I was not sure about. I will place the forms in Dr Creta LevinStallings box on 11/19/17 please complete and return to the FMLA/Disability desk in the back office at 104 within 5-7 business days. Thank you!

## 2017-11-20 ENCOUNTER — Telehealth: Payer: Self-pay | Admitting: Family Medicine

## 2017-11-20 NOTE — Telephone Encounter (Signed)
Copied from CRM [phone]. Topic: Quick Communication - See Telephone Encounter >> Nov 20, 2017  2:43 PM Lorrine KinMcGee, Acsa Estey B, NT wrote: CRM for notification. See Telephone encounter for: 11/20/17. Patient calling and states that the augmentin is making her nauseous and sick. States that she has phenergan but it is making her drowsy. Would like to know if zofran could be sent to the pharmacy? Would also like a work note to excuse her from work for 11/19/17 and 11/20/17, if possible. CB#: [phone] [facility] [phone] -[place], [place] - [address] AT [facility] [place] [address]

## 2017-11-20 NOTE — Telephone Encounter (Signed)
Please advise 

## 2017-11-21 ENCOUNTER — Encounter: Payer: Self-pay | Admitting: Family Medicine

## 2017-11-21 MED ORDER — ONDANSETRON HCL 4 MG PO TABS: 4.0000 mg | ORAL_TABLET | Freq: Three times a day (TID) | ORAL | 0 refills | Status: DC | PRN

## 2017-11-21 NOTE — Telephone Encounter (Signed)
Completed and [facility]

## 2017-11-21 NOTE — Telephone Encounter (Signed)
Pt letter sent to [employer]. Zofran to pharmacy.

## 2017-11-23 ENCOUNTER — Encounter (HOSPITAL_COMMUNITY): Payer: Self-pay | Admitting: Emergency Medicine

## 2017-11-23 ENCOUNTER — Emergency Department (HOSPITAL_COMMUNITY)
Admission: EM | Admit: 2017-11-23 | Discharge: 2017-11-23 | Disposition: A | Discharge status: Home / Self Care | Payer: 59 | Attending: Emergency Medicine | Admitting: Emergency Medicine

## 2017-11-23 ENCOUNTER — Other Ambulatory Visit: Payer: Self-pay | Admitting: Family Medicine

## 2017-11-23 DIAGNOSIS — Y939 Activity, unspecified: Secondary | ICD-10-CM | POA: Diagnosis not present

## 2017-11-23 DIAGNOSIS — I10 Essential (primary) hypertension: Secondary | ICD-10-CM

## 2017-11-23 DIAGNOSIS — Z79899 Other long term (current) drug therapy: Secondary | ICD-10-CM | POA: Diagnosis not present

## 2017-11-23 DIAGNOSIS — T7840XA Allergy, unspecified, initial encounter: Secondary | ICD-10-CM | POA: Insufficient documentation

## 2017-11-23 DIAGNOSIS — F419 Anxiety disorder, unspecified: Secondary | ICD-10-CM | POA: Insufficient documentation

## 2017-11-23 DIAGNOSIS — Y999 Unspecified external cause status: Secondary | ICD-10-CM | POA: Diagnosis not present

## 2017-11-23 DIAGNOSIS — S70361A Insect bite (nonvenomous), right thigh, initial encounter: Secondary | ICD-10-CM | POA: Diagnosis present

## 2017-11-23 DIAGNOSIS — W57XXXA Bitten or stung by nonvenomous insect and other nonvenomous arthropods, initial encounter: Secondary | ICD-10-CM | POA: Diagnosis not present

## 2017-11-23 DIAGNOSIS — Y929 Unspecified place or not applicable: Secondary | ICD-10-CM | POA: Diagnosis not present

## 2017-11-23 MED ORDER — METHYLPREDNISOLONE SODIUM SUCC 125 MG IJ SOLR
125.0000 mg | Freq: Once | INTRAMUSCULAR | Status: AC
  Administered 2017-11-23: 125 mg via INTRAVENOUS
  Filled 2017-11-23: qty 2

## 2017-11-23 MED ORDER — PREDNISONE 20 MG PO TABS: 40.0000 mg | ORAL_TABLET | Freq: Every day | ORAL | 0 refills | Status: AC

## 2017-11-23 MED ORDER — KETOROLAC TROMETHAMINE 30 MG/ML IJ SOLN
15.0000 mg | Freq: Once | INTRAMUSCULAR | Status: AC
  Administered 2017-11-23: 15 mg via INTRAVENOUS
  Filled 2017-11-23: qty 1

## 2017-11-23 MED ORDER — SODIUM CHLORIDE 0.9 % IV BOLUS
1000.0000 mL | Freq: Once | INTRAVENOUS | Status: AC
  Administered 2017-11-23: 1000 mL via INTRAVENOUS

## 2017-11-23 NOTE — ED Triage Notes (Addendum)
Per EMS, pt from home. Pt was outside doing yard work when she got bit inside rt thigh by flying insect. Pt was flushed upon ems arrival, inflammation noted to rt thigh. EMS gave 50mg  Benadryl IV. Hx of HTN, unsure if she took BP meds today or not. Pt denies pain. Pt denies sob. EMS VS BP 162/88, HR 98, RR 18, 99% room air.

## 2017-11-23 NOTE — ED Provider Notes (Signed)
[facility] Provider Note   CSN: [id number] Arrival date & time: 11/23/17  1712     History   Chief Complaint Chief Complaint  Patient presents with  . Insect Bite  . Allergic Reaction    HPI Helen Greene is a 47 y.o. female.  HPI Patient presents about 2 hours after sustaining an insect bite. She recalls a sudden onset of pain after seeing a large brown object bite her up on the right thigh. She is unsure what the insect was, but it seemed to latch on for several moments. Since that time she has had severe burning pain in the medial right thigh. Some anxiousness, some dyspnea, but no sense of throat closure. Patient received Benadryl on EMS arrival, notes that in spite of this she continues to feel pain, has redness and swelling about to the affected area. She acknowledges a history of hypertension, anxiety, states that the anxiety in particular is worse after this event.  Past Medical History:  Diagnosis Date  . Allergy   . Anxiety   . Depression   . Elevated white blood cell count   . Elevated white blood cell count 09/25/2011  . GERD (gastroesophageal reflux disease)   . Headache    Migraine  . Hypertension   . Kidney stone   . Neuromuscular disorder (HCC)    carpel tunnel disease  . Pneumonia 08/15/2017  . Restless leg syndrome   . Shortness of breath dyspnea    with exertion  . Sleep apnea    does not have CPAP machine, mostly affects back sleeping was encouraged to sleep on her sides  . Tonsillar calculus     Patient Active Problem List   Diagnosis Date Noted  . AKI (acute kidney injury) (HCC) 08/15/2017  . Sepsis due to pneumonia (HCC) 08/15/2017  . Right sided abdominal pain 08/15/2017  . History of tonsillectomy Feb 25,2019 08/15/2017  . Hypertension 08/15/2017  . Sleep apnea 08/15/2017  . Anxiety 08/15/2017  . Restless leg syndrome 08/15/2017  . Post-op pneumonia 08/15/2017  . Morbid obesity with BMI of  40.0-44.9, adult (HCC) 08/15/2017  . Shock liver 08/15/2017  . S/P tonsillectomy 08/11/2017  . S/P total hysterectomy 09/14/2015  . Frequent headaches 03/17/2015  . RLS (restless legs syndrome) 03/17/2015  . HTN (hypertension) 05/19/2013  . Elevated white blood cell count 09/25/2011    Past Surgical History:  Procedure Laterality Date  . ABDOMINAL HYSTERECTOMY    . CHOLECYSTECTOMY    . COLONOSCOPY    . DILATION AND CURETTAGE OF UTERUS  1993  . ECTOPIC PREGNANCY SURGERY    . ESSURE TUBAL LIGATION    . LITHOTRIPSY    . ROBOTIC ASSISTED TOTAL HYSTERECTOMY WITH SALPINGECTOMY Left 09/14/2015   Procedure: ROBOTIC ASSISTED TOTAL HYSTERECTOMY WITH LEFT SALPINGECTOMY;  Surgeon: Noland FordyceKelly Fogleman, MD;  Location: [facility];  Service: Gynecology;  Laterality: Left;  . TONSILLECTOMY N/A 08/11/2017   Procedure: TONSILLECTOMY;  Surgeon: Serena Colonelosen, Jefry, MD;  Location: [facility];  Service: ENT;  Laterality: N/A;  . TONSILLECTOMY  07/2017  . UPPER GI ENDOSCOPY    . WISDOM TOOTH EXTRACTION       OB History    Gravida  3   Para  2   Term      Preterm      AB  1   Living  2     SAB      TAB      Ectopic  1  Multiple      Live Births               Home Medications    Prior to Admission medications   Medication Sig Start Date End Date Taking? Authorizing Provider  amoxicillin-clavulanate (AUGMENTIN) 875-125 MG tablet Take 1 tablet by mouth every 12 (twelve) hours. 11/13/17   Roxy Horseman, PA-C  buPROPion (WELLBUTRIN XL) 150 MG 24 hr tablet Take 450 mg by mouth at bedtime.  10/15/17   [provider]  buPROPion (WELLBUTRIN XL) 300 MG 24 hr tablet Take 1 tablet (300 mg total) by mouth daily. 01/14/17   Doristine Bosworth, MD  clobetasol cream (TEMOVATE) 0.05 % Apply 1 application topically 2 (two) times daily. Thin application to left vulva. 04/08/17   Genia Del, MD  cyclobenzaprine (FLEXERIL) 5 MG tablet Take 1 tablet (5 mg total) by mouth 3 (three)  times daily as needed for muscle spasms. 01/14/17   Doristine Bosworth, MD  escitalopram (LEXAPRO) 10 MG tablet TAKE 1 TABLET(10 MG) BY MOUTH DAILY 07/30/17   Doristine Bosworth, MD  folic acid (FOLVITE) 1 MG tablet Take 1 tablet (1 mg total) by mouth daily. 01/14/17   Doristine Bosworth, MD  hydrOXYzine (VISTARIL) 25 MG capsule Take 75 mg by mouth at bedtime.  10/21/17   [provider]  ketorolac (TORADOL) 10 MG tablet Take 1 tablet (10 mg total) by mouth every 8 (eight) hours as needed for moderate pain or severe pain. Patient not taking: Reported on 11/06/2017 08/19/17   Noralee Stain, DO  lisinopril-hydrochlorothiazide (PRINZIDE,ZESTORETIC) 10-12.5 MG tablet TAKE 1 TABLET BY MOUTH DAILY 07/30/17   Doristine Bosworth, MD  mirabegron ER (MYRBETRIQ) 50 MG TB24 tablet Take 50 mg by mouth daily.    [provider]  nystatin (MYCOSTATIN) 100000 UNIT/ML suspension Use as directed 5 mLs (500,000 Units total) in the mouth or throat 2 (two) times daily as needed (for thrush). 08/19/17   Noralee Stain, DO  nystatin (MYCOSTATIN/NYSTOP) powder Apply topically 2 (two) times daily. 06/28/16   Doristine Bosworth, MD  ondansetron (ZOFRAN) 4 MG tablet Take 1 tablet (4 mg total) by mouth every 8 (eight) hours as needed for nausea or vomiting. 11/21/17   Doristine Bosworth, MD  pramipexole (MIRAPEX) 1.5 MG tablet TAKE 1 TABLET(1.5 MG) BY MOUTH AT BEDTIME 09/29/17   Doristine Bosworth, MD  promethazine (PHENERGAN) 25 MG suppository Place 1 suppository (25 mg total) rectally every 6 (six) hours as needed for nausea or vomiting. Patient not taking: Reported on 11/17/2017 08/11/17   Serena Colonel, MD  risperiDONE (RISPERDAL) 0.25 MG tablet Take 0.5 mg by mouth at bedtime. 10/15/17   [provider]    Family History Family History  Problem Relation Age of Onset  . Hypertension Mother   . Heart failure Mother   . Cancer Mother   . Heart disease Mother   . Heart disease Brother   . Hypertension Brother   . Cancer  Father        Leukemia  . Hypertension Father   . Cancer Sister   . Diabetes Other   . Colon cancer Neg Hx   . Esophageal cancer Neg Hx   . Rectal cancer Neg Hx   . Stomach cancer Neg Hx     Social History Social History   Tobacco Use  . Smoking status: Never Smoker  . Smokeless tobacco: Never Used  Substance Use Topics  . Alcohol use: Yes  Alcohol/week: 0.0 oz    Comment: social  . Drug use: No     Allergies   Bee venom; Enablex [darifenacin hydrobromide]; Erythromycin; Macrobid [nitrofurantoin monohyd macro]; and Nitrofuran derivatives   Review of Systems Review of Systems  Constitutional:       Per HPI, otherwise negative  HENT:       Per HPI, otherwise negative  Respiratory:       Per HPI, otherwise negative  Cardiovascular:       Per HPI, otherwise negative  Gastrointestinal: Negative for vomiting.  Endocrine:       Negative aside from HPI  Genitourinary:       Neg aside from HPI   Musculoskeletal:       Per HPI, otherwise negative  Skin: Positive for wound.  Neurological: Negative for syncope.     Physical Exam Updated Vital Signs BP 129/90 (BP Location: Right Arm)   Pulse 93   Temp 98.2 F (36.8 C) (Oral)   Resp 18   LMP 09/07/2015 (Exact Date)   SpO2 99%   Physical Exam  Constitutional: She is oriented to person, place, and time. She appears well-developed and well-nourished. No distress.  HENT:  Head: Normocephalic and atraumatic.  Eyes: Conjunctivae and EOM are normal.  Neck: Trachea normal. Neck supple.  Cardiovascular: Normal rate and regular rhythm.  Pulmonary/Chest: Effort normal and breath sounds normal. No stridor. No respiratory distress.  Abdominal: She exhibits no distension.  Musculoskeletal: She exhibits no edema.  Neurological: She is alert and oriented to person, place, and time. No cranial nerve deficit.  Skin: Skin is warm and dry.     Psychiatric: She has a normal mood and affect.  Nursing note and vitals  reviewed.    ED Treatments / Results   Procedures Procedures (including critical care time)  Medications Ordered in ED Medications  sodium chloride 0.9 % bolus 1,000 mL (1,000 mLs Intravenous New Bag/Given 11/23/17 1927)  methylPREDNISolone sodium succinate (SOLU-MEDROL) 125 mg/2 mL injection 125 mg (125 mg Intravenous Given 11/23/17 1927)  ketorolac (TORADOL) 30 MG/ML injection 15 mg (15 mg Intravenous Given 11/23/17 1933)     Initial Impression / Assessment and Plan / ED Course  I have reviewed the triage vital signs and the nursing notes.  Pertinent labs & imaging results that were available during my care of the patient were reviewed by me and considered in my medical decision making (see chart for details).    On repeat exam the patient is awake and alert, states that she feels somewhat better, continues to have pain in the area. The lesion itself is slightly bigger in diameter, but much less erythematous in general, no other new erythema, or complaints.  When we discussed the importance of home monitoring, Benadryl, Pepcid, steroids, and given the absence of evidence for anaphylaxis or other complications of her allergic reaction, from what we think was insect bite, the patient was discharged in stable condition.   Final Clinical Impressions(s) / ED Diagnoses   Final diagnoses:  Allergic reaction, initial encounter    ED Discharge Orders        Ordered    predniSONE (DELTASONE) 20 MG tablet  Daily with breakfast     11/23/17 2031       Gerhard Munch, MD 11/23/17 2042

## 2017-11-23 NOTE — Discharge Instructions (Signed)
As discussed, you likely have soreness in the area of the bite and allergic reaction for several days per Please use Benadryl, 25 mg, 3 times daily, and Pepcid, 20 mg, 2 times daily for the next 2 days. In addition, take the prescribed steroids as directed.  Return here for concerning changes.

## 2017-11-24 NOTE — Telephone Encounter (Signed)
Paperwork scanned and faxed on 11/24/17 °

## 2017-12-06 ENCOUNTER — Other Ambulatory Visit: Payer: Self-pay | Admitting: Family Medicine

## 2017-12-06 NOTE — Telephone Encounter (Signed)
Patient is requesting a refill of the following medications: Requested Prescriptions   Pending Prescriptions Disp Refills  . buPROPion (WELLBUTRIN XL) 300 MG 24 hr tablet [Pharmacy Med Name: BUPROPION XL 300MG  TABLETS] 90 tablet 0    Sig: TAKE 1 TABLET(300 MG) BY MOUTH DAILY    Date of patient request: 12/06/2017 Last office visit: 11/17/2017 Date of last refill: 01/14/2017 Last refill amount: # 90 with one refill  Follow up time period per chart: n/a   Please advise pt last seen for this issue on 01/14/17. Dgaddy, CMA

## 2017-12-10 ENCOUNTER — Other Ambulatory Visit: Payer: Self-pay | Admitting: Family Medicine

## 2017-12-10 NOTE — Telephone Encounter (Signed)
Patient is requesting a refill of the following medications: Requested Prescriptions   Pending Prescriptions Disp Refills  . valACYclovir (VALTREX) 1000 MG tablet [Pharmacy Med Name: VALACYCLOVIR 1GM TABLETS] 20 tablet 0    Sig: TAKE 2 TABLETS BY MOUTH EVERY 12 HOURS FOR ONE DAY WITH OUTBREAK    Date of patient request: 12/10/17 Last office visit: 11/17/2017 Date of last refill: n/a Last refill amount: n/a Follow up time period per chart: n/a  Please advise.  This medication was d'cont. On 08/19/17 by Noralee Stainjennifer Choi,  DO.  This is not on pt medication list.  Dgaddy, CMA

## 2017-12-29 ENCOUNTER — Ambulatory Visit (INDEPENDENT_AMBULATORY_CARE_PROVIDER_SITE_OTHER): Payer: 59 | Admitting: Internal Medicine

## 2017-12-29 ENCOUNTER — Encounter: Payer: Self-pay | Admitting: Internal Medicine

## 2017-12-29 VITALS — BP 108/66 | HR 97 | Ht 59.5 in | Wt 199.6 lb

## 2017-12-29 DIAGNOSIS — R058 Other specified cough: Secondary | ICD-10-CM

## 2017-12-29 DIAGNOSIS — R05 Cough: Secondary | ICD-10-CM | POA: Diagnosis not present

## 2017-12-29 DIAGNOSIS — R918 Other nonspecific abnormal finding of lung field: Secondary | ICD-10-CM | POA: Diagnosis not present

## 2017-12-29 DIAGNOSIS — I10 Essential (primary) hypertension: Secondary | ICD-10-CM

## 2017-12-29 MED ORDER — AMOXICILLIN-POT CLAVULANATE 875-125 MG PO TABS: 1.0000 | ORAL_TABLET | Freq: Two times a day (BID) | ORAL | 0 refills | Status: AC

## 2017-12-29 MED ORDER — PREDNISONE 10 MG PO TABS: ORAL_TABLET | ORAL | 0 refills | Status: DC

## 2017-12-29 MED ORDER — TELMISARTAN-HCTZ 40-12.5 MG PO TABS: 1.0000 | ORAL_TABLET | Freq: Every day | ORAL | 11 refills | Status: DC

## 2017-12-29 NOTE — Progress Notes (Addendum)
Helen Greene, female    DOB: 03-Oct-1970,    MRN: [id number]    Brief patient profile:  [age] yowf never smoker classic rhinitis while exp to [place] as teenage x 4 years, completely resolved p avoided exp s cough wheeze or need for meds but then w/in few seasons noted same symptoms with cut grass so saw allergist rec nasal spray/clariton and some residual sneezing/nasal congestion but only mild so never returned to allergist but continued to have symptoms year round esp in spring complicated by sinus infections rx by PCP but in march 2019 > [facility] eval for sore throat >>  s/p tonsilectomy then post op fever/chills / dx strep pneumo sepsis/ RUL pna and  referred to pulmonary clinic 12/29/2017 by Dr   Helen SiadZoe Greene with cta chest 11/13/17 showing min residual Post RUL gg changes but no improvement in symptoms of cough.   Admit date: 08/15/2017 Discharge date: 08/19/2017  Admitted From: Home Disposition:  Home  Recommendations for Outpatient Follow-up:  1. Follow up with PCP in 1 week 2. Follow up with Dr. Pollyann Greene, ENT, next week as previously scheduled  3. Please obtain BMP in 1 week to recheck kidney function 4. Please repeat CXR in 3-4 weeks to ensure resolution of pneumonia    Discharge Condition: Stable CODE STATUS: Full  Diet recommendation: Heart healthy diet   Brief/Interim Summary: Helen RadarLiesa L Bradyis a 47 year old female with a history of obstructive sleep apnea, intolerant to CPAP, restless leg syndrome, anxiety disorder, obesity who has long-standing history of tonsillar stones and underwent tonsillectomy on August 11, 2017 by Dr. Pollyann Greene (ENT). After surgery, she was discharged home. Patient started having chills and fever also had one episode of vomiting. Patient says that she has not been able to cough out phlegm also unable to blow her nose. She has had pain with swallowing food and liquids at home but tried her best to keep hydrated. In the ED, work up revealed RUL pneumonia, sepsis,  AKI, hypotension. She was started on broad-spectrum antibiotics. Blood culture returned positive for PCN-sensitive strep pneumo. She was treated with IV rocephin and WBC trended down to normal. She was given IVF and AKI resolved back to baseline kidney function. She is discharged home on amoxicillin to complete total 14 days of treatment inclusive of IV antibiotics received during hospitalization.   Discharge Diagnoses:  Principal Problem:   Sepsis due to pneumonia [facility](HCC) Active Problems:   AKI (acute kidney injury) (HCC)   Right sided abdominal pain   History of tonsillectomy Feb 25,2019   Hypertension   Sleep apnea   Anxiety   Restless leg syndrome   Post-op pneumonia   Morbid obesity with BMI of 40.0-44.9, adult (HCC)   Shock liver  Sepsis due to right upper lobar pneumonia, strep pneumo bacteremia  -Influenza negative  -HIV negative  -Blood culture with strep pneumo, PCN and cephalosporin sensitive -Treated with vanco/rocephin initially, transitioned to oral amoxicillin   AKI  -Baseline creatinine 0.88 -Likely secondary to poor PO intake post-op -Resolved -Now off IVF. Resume home lisinopril-HCTZ   Recent tonsillectomy -CT soft tissue neck: swelling/inflammation at tonsillectomy sites with symmetry favoring postoperative change over surgical site infection. No evidence of abscess or airway narrowing. Left maxillary and ethmoid sinusitis. -Will order topical anesthetic for comfort  Anxiety -Vistarilprn -Continue Wellbutrin and Lexapro  Hypertension -Resume home lisinopril-HCTZ  OSA -Does not utilize CPAP at home  Restless leg syndrome -Continue Mirapex  HSV 1 -Recurrent outbreak -Acyclovir x 5 days  Morbid obesity with BMI of 40.0-44.9, adult        12/29/2017  Helen Greene/ 1st pulmonary eval/ still on ACEi  Chief Complaint  Patient presents with  . Pulmonary Consult    Referred by Dr. Collie Greene.  Pt states had PNA end of Feb 2019 and then  again May 2019. She c/o no energy and DOE if she walks fast.  She states she recently developed a cold and is coughing with white to green tinged sputum.    Dyspnea: MMRC2 = can't walk a nl pace on a flat grade s sob but does fine slow and flat  Cough: esp morning after stirring/ voice fatigue> slt green mucus  Sleep: in recliner x several days but has had to prop up on sev pillows due to noct choking/cough x months    No obvious day to day or daytime variability or assoc   mucus plugs or hemoptysis or cp or chest tightness, subjective wheeze or overt  hb symptoms.     Also denies any obvious fluctuation of symptoms with weather or environmental changes or other aggravating or alleviating factors except as outlined above   No unusual exposure hx or h/o childhood pna/ asthma or knowledge of premature birth.  Current Allergies, Complete Past Medical History, Past Surgical History, Family History, and Social History were reviewed in [employer] record.  ROS  The following are not active complaints unless bolded Hoarseness, sore throat, dysphagia, dental problems, itching, sneezing,  nasal congestion or discharge of excess mucus or purulent secretions, ear ache,   fever, chills, sweats, unintended wt loss or wt gain, classically pleuritic or exertional cp,  orthopnea pnd or arm/hand swelling  or leg swelling, presyncope, palpitations, abdominal pain, anorexia, nausea, vomiting, diarrhea  or change in bowel habits or change in bladder habits, change in stools or change in urine, dysuria, hematuria,  rash, arthralgias, visual complaints, headache, numbness, weakness or ataxia or problems with walking or coordination,  change in mood or  memory.                Past Medical History:  Diagnosis Date  . Allergy   . Anxiety   . Depression   . Elevated white blood cell count   . Elevated white blood cell count 09/25/2011  . GERD (gastroesophageal reflux disease)   . Headache     Migraine  . Hypertension   . Kidney stone   . Neuromuscular disorder (HCC)    carpel tunnel disease  . Pneumonia 08/15/2017  . Restless leg syndrome   . Shortness of breath dyspnea    with exertion  . Sleep apnea    does not have CPAP machine, mostly affects back sleeping was encouraged to sleep on her sides  . Tonsillar calculus     Outpatient Medications Prior to Visit  Medication Sig Dispense Refill  . Aspirin Effervescent (ALKA-SELTZER ORIGINAL PO) Take by mouth as needed.    Marland Kitchen buPROPion (WELLBUTRIN XL) 150 MG 24 hr tablet Take 450 mg by mouth at bedtime.   1  . clobetasol cream (TEMOVATE) 0.05 % Apply 1 application topically 2 (two) times daily. Thin application to left vulva. 30 g 0  . cyclobenzaprine (FLEXERIL) 5 MG tablet Take 1 tablet (5 mg total) by mouth 3 (three) times daily as needed for muscle spasms. 90 tablet 6  . escitalopram (LEXAPRO) 10 MG tablet TAKE 1 TABLET(10 MG) BY MOUTH DAILY 90 tablet 0  . folic acid (FOLVITE) 1 MG tablet  Take 1 tablet (1 mg total) by mouth daily. 90 tablet 1  . hydrOXYzine (VISTARIL) 25 MG capsule Take 75 mg by mouth at bedtime.   2  . lisinopril-hydrochlorothiazide (PRINZIDE,ZESTORETIC) 10-12.5 MG tablet TAKE 1 TABLET BY MOUTH DAILY 30 tablet 0  . mirabegron ER (MYRBETRIQ) 50 MG TB24 tablet Take 50 mg by mouth daily.    Marland Kitchen nystatin (MYCOSTATIN/NYSTOP) powder Apply topically 2 (two) times daily. 15 g 1  . ondansetron (ZOFRAN) 4 MG tablet Take 1 tablet (4 mg total) by mouth every 8 (eight) hours as needed for nausea or vomiting. 20 tablet 0  . pramipexole (MIRAPEX) 1.5 MG tablet TAKE 1 TABLET(1.5 MG) BY MOUTH AT BEDTIME 90 tablet 0  . promethazine (PHENERGAN) 25 MG suppository Place 1 suppository (25 mg total) rectally every 6 (six) hours as needed for nausea or vomiting. 12 suppository 1  . valACYclovir (VALTREX) 1000 MG tablet TAKE 2 TABLETS BY MOUTH EVERY 12 HOURS AS NEEDED FOR ONE DAY WITH OUTBREAK 20 tablet 0  . amoxicillin-clavulanate  (AUGMENTIN) 875-125 MG tablet Take 1 tablet by mouth every 12 (twelve) hours. 14 tablet 0  . buPROPion (WELLBUTRIN XL) 300 MG 24 hr tablet TAKE 1 TABLET(300 MG) BY MOUTH DAILY 90 tablet 1  . ketorolac (TORADOL) 10 MG tablet Take 1 tablet (10 mg total) by mouth every 8 (eight) hours as needed for moderate pain or severe pain. (Patient not taking: Reported on 11/06/2017) 20 tablet 0  . nystatin (MYCOSTATIN) 100000 UNIT/ML suspension Use as directed 5 mLs (500,000 Units total) in the mouth or throat 2 (two) times daily as needed (for thrush). 60 mL 0  . risperiDONE (RISPERDAL) 0.25 MG tablet Take 0.5 mg by mouth at bedtime.  1   No facility-administered medications prior to visit.               Objective:     BP 108/66 (BP Location: Left Arm, Cuff Size: Normal)   Pulse 97   Ht 4' 11.5" (1.511 m)   Wt 199 lb 9.6 oz (90.5 kg)   LMP 09/07/2015 (Exact Date)   SpO2 97%   BMI 39.64 kg/m   SpO2: 97 %  RA  slt hoarse amb wf nad with occ throat clearing and harsh upper airway pattern cough   HEENT: nl dentition, turbinates bilaterally, and oropharynx. Nl external ear canals without cough reflex   NECK :  without JVD/Nodes/TM/ nl carotid upstrokes bilaterally   LUNGS: no acc muscle use,  Nl contour chest which is clear to A and P bilaterally without cough on insp or exp maneuvers   CV:  RRR  no s3 or murmur or increase in P2, and trace pedal edema sym bilaterally   ABD:  soft and nontender with nl inspiratory excursion in the supine position. No bruits or organomegaly appreciated, bowel sounds nl  MS:  Nl gait/ ext warm without deformities, calf tenderness, cyanosis or clubbing No obvious joint restrictions   SKIN: warm and dry without lesions    NEURO:  alert, approp, nl sensorium with  no motor or cerebellar deficits apparent.    I personally reviewed images and agree with radiology impression as follows:   Chest CTa 11/13/17 1. No evidence of significant pulmonary  embolus. 2. Mild hazy opacity at the posterior aspect of the right upper lung lobe, raising question for pneumonia. 3. Small pleural based nodules at the right middle lobe, measuring up to 5 mm in size. No follow-up needed if patient is low-risk  Assessment   Upper airway cough syndrome S/p tonsilectomy 08/11/17 - Sinus CT pos 08/15/17: . Left maxillary and ethmoid sinusitis. - trial off acei 12/29/2017   Upper airway cough syndrome (previously labeled PNDS),  is so named because it's frequently impossible to sort out how much is  CR/sinusitis with freq throat clearing (which can be related to primary GERD)   vs  causing  secondary (" extra esophageal")  GERD from wide swings in gastric pressure that occur with throat clearing, often  promoting self use of mint and menthol lozenges that reduce the lower esophageal sphincter tone and exacerbate the problem further in a cyclical fashion.   These are the same pts (now being labeled as having "irritable larynx syndrome" by some cough centers) who not infrequently have a history of having failed to tolerate ace inhibitors,  dry powder inhalers or biphosphonates or report having atypical/extraesophageal reflux symptoms that don't respond to standard doses of PPI  and are easily confused as having aecopd or asthma flares by even experienced allergists/ pulmonologists (myself included).   rec off acei/ rx possible residual sinusitis with augmentin x 10 days and f/u rosen prn  Will cover with otc gerd rx also until cough gone and f/u in 4 weeks   Essential hypertension In the best review of chronic cough to date ( NEJM 2016 375 [id number]) ,  ACEi are now felt to cause cough in up to  20% of pts which is a 4 fold increase from previous reports and does not include the variety of non-specific complaints we see in pulmonary clinic in pts on ACEi but previously attributed to another dx like  Copd/asthma and  include PNDS, throat congestion, "bronchitis",  unexplained dyspnea and noct "strangling" sensations, and hoarseness, but also  atypical /refractory GERD symptoms like dysphagia and "bad heartburn"   The only way I know  to prove this is not an "ACEi Case" is a trial off ACEi x a minimum of 4  weeks then regroup.   Try micardis 40-12.5 one daily        Abnormal CT scan of lung S/p strep pneumo sepsis/ RUL pna  08/15/17 with min resdiual RUL post segment changes  11/13/17   In a never smoker with documented strep pneumo in blood the CT findings are c/w organized/ resolving post pna air space changes that can't be seen on a comparison cxr which shows dramatic clearing from priors.  Therefore no f/u needed as long as all symptoms resolve   As to regards to the spn :  CT results reviewed with pt >>> Too small for PET or bx, not suspicious enough for excisional bx > really only option for now is follow the Fleischner society guidelines as rec by radiology = no directed f/u   Discussed in detail all the  indications, usual  risks and alternatives  relative to the benefits with patient who agrees to proceed with conservative f/u as outlined      Total time devoted to counseling  > 50 % of initial 60 min office visit:  review case with pt/ discussion of options/alternatives/ personally creating written customized instructions  in presence of pt  then going over those specific  Instructions directly with the pt including how to use all of the meds but in particular covering each new medication in detail and the difference between the maintenance= "automatic" meds and the prns using an action plan format for the latter (If this problem/symptom => do that organization reading  Left to right).  Please see AVS from this visit for a full list of these instructions which I personally wrote for this pt and  are unique to this visit.      Sandrea Hughs, MD 12/29/2017

## 2017-12-29 NOTE — Patient Instructions (Addendum)
Augmentin 875 mg take one pill twice daily  X 10 days - take at breakfast and supper with large glass of water.  It would help reduce the usual side effects (diarrhea and yeast infections) if you ate cultured yogurt at lunch.   Prednisone 10 mg take  4 each am x 2 days,   2 each am x 2 days,  1 each am x 2 days and stop   Try prilosec otc 20mg   Take 30-60 min before first meal of the day and Pepcid ac (famotidine) 20 mg one @  bedtime until cough is completely gone for at least a week without the need for cough suppression  For cough > mucinex dm 1200 mg every 12 hours with glass of water   Stop lisinorpil today and start micardis 40- 12.5 one daily in its place - call me if problems with this  Please schedule a follow up office visit in 4 weeks, sooner if needed

## 2017-12-30 ENCOUNTER — Encounter: Payer: Self-pay | Admitting: Internal Medicine

## 2017-12-30 DIAGNOSIS — R918 Other nonspecific abnormal finding of lung field: Secondary | ICD-10-CM | POA: Insufficient documentation

## 2017-12-30 DIAGNOSIS — R490 Dysphonia: Secondary | ICD-10-CM | POA: Insufficient documentation

## 2017-12-30 NOTE — Assessment & Plan Note (Addendum)
In the best review of chronic cough to date ( NEJM 2016 375 [phone]) ,  ACEi are now felt to cause cough in up to  20% of pts which is a 4 fold increase from previous reports and does not include the variety of non-specific complaints we see in pulmonary clinic in pts on ACEi but previously attributed to another dx like  Copd/asthma and  include PNDS, throat congestion, "bronchitis", unexplained dyspnea and noct "strangling" sensations, and hoarseness, but also  atypical /refractory GERD symptoms like dysphagia and "bad heartburn"   The only way I know  to prove this is not an "ACEi Case" is a trial off ACEi x a minimum of 4  weeks then regroup.   Try micardis 40-12.5 one daily

## 2017-12-30 NOTE — Assessment & Plan Note (Signed)
S/p strep pneumo sepsis/ RUL pna  08/15/17 with min resdiual RUL post segment changes  11/13/17   In a never smoker with documented strep pneumo in blood the CT findings are c/w organized/ resolving post pna air space changes that can't be seen on a comparison cxr which shows dramatic clearing from priors.  Therefore no f/u needed as long as all symptoms resolve   As to regards to the spn :  CT results reviewed with pt >>> Too small for PET or bx, not suspicious enough for excisional bx > really only option for now is follow the Fleischner society guidelines as rec by radiology = no directed f/u   Discussed in detail all the  indications, usual  risks and alternatives  relative to the benefits with patient who agrees to proceed with conservative f/u as outlined      Total time devoted to counseling  > 50 % of initial 60 min office visit:  review case with pt/ discussion of options/alternatives/ personally creating written customized instructions  in presence of pt  then going over those specific  Instructions directly with the pt including how to use all of the meds but in particular covering each new medication in detail and the difference between the maintenance= "automatic" meds and the prns using an action plan format for the latter (If this problem/symptom => do that organization reading Left to right).  Please see AVS from this visit for a full list of these instructions which I personally wrote for this pt and  are unique to this visit.

## 2017-12-30 NOTE — Assessment & Plan Note (Signed)
S/p tonsilectomy 08/11/17 - Sinus CT pos 08/15/17: . Left maxillary and ethmoid sinusitis. - trial off acei 12/29/2017   Upper airway cough syndrome (previously labeled PNDS),  is so named because it's frequently impossible to sort out how much is  CR/sinusitis with freq throat clearing (which can be related to primary GERD)   vs  causing  secondary (" extra esophageal")  GERD from wide swings in gastric pressure that occur with throat clearing, often  promoting self use of mint and menthol lozenges that reduce the lower esophageal sphincter tone and exacerbate the problem further in a cyclical fashion.   These are the same pts (now being labeled as having "irritable larynx syndrome" by some cough centers) who not infrequently have a history of having failed to tolerate ace inhibitors,  dry powder inhalers or biphosphonates or report having atypical/extraesophageal reflux symptoms that don't respond to standard doses of PPI  and are easily confused as having aecopd or asthma flares by even experienced allergists/ pulmonologists (myself included).   rec off acei/ rx possible residual sinusitis with augmentin x 10 days and f/u rosen prn  Will cover with otc gerd rx also until cough gone and f/u in 4 weeks

## 2018-01-05 ENCOUNTER — Telehealth: Payer: Self-pay | Admitting: Internal Medicine

## 2018-01-05 MED ORDER — FLUCONAZOLE 100 MG PO TABS: 100.0000 mg | ORAL_TABLET | Freq: Every day | ORAL | 0 refills | Status: DC

## 2018-01-05 NOTE — Telephone Encounter (Signed)
lmtcb for pt.  Diflucan sent to preferred pharmacy.  RB please advise if you're willing to take on patient.  Thanks!

## 2018-01-05 NOTE — Telephone Encounter (Signed)
Pt was prescribed Augmentin at last OV, is now experiencing a yeast infection X few days.  Pt is requesting at least 2 days worth of Fluconazole.   Pharmacy: [facility] on [address].   Pt is also requesting to switch from MW to RB.  MW please advise on fluconazole and md switch request.  Thanks!

## 2018-01-05 NOTE — Telephone Encounter (Signed)
[facility] [address].  She is requesting 2 doses of the fluconazole or at least a refill.

## 2018-01-05 NOTE — Telephone Encounter (Signed)
Fine with me but be sure she's seen in 3 weeks from now to check response to bp meds and recheck bp  as this is the critical issue to decide what else is needed going forward  Diflucan 100 mg daily x 3 days

## 2018-01-06 NOTE — Telephone Encounter (Signed)
Spoke with pt and advised that Diflucan rx was sent to pharmacy. Also advised that we are waiting to hear from Dr Delton CoombesByrum regarding pt switching care to him.  RB please advise

## 2018-01-06 NOTE — Telephone Encounter (Signed)
Spoke with pt and advised of CT addendum per Dr Sherene SiresWert.  Also scheduled 30 minute ov with Dr Delton CoombesByrum.  Pt verbalized understanding.  Nothing further needed.

## 2018-01-06 NOTE — Telephone Encounter (Signed)
OK with me.

## 2018-01-06 NOTE — Telephone Encounter (Signed)
Let her know that I had a chance to go over her CT chest with Dr Logan BoresEvans who is going to write an addendum that he area of greatest concern in the RUL is markedly improved from her pna CT and is c/w resolved  (not active dz) so no need for f/u ct if all symptoms resolve but happy to defer that call to DrByrum since she has chosen to change docs

## 2018-01-06 NOTE — Telephone Encounter (Signed)
Pt is returning call. Cb is [phone]

## 2018-01-16 ENCOUNTER — Encounter: Payer: 59 | Admitting: Obstetrics & Gynecology

## 2018-01-19 ENCOUNTER — Telehealth: Payer: Self-pay | Admitting: Family Medicine

## 2018-01-19 DIAGNOSIS — N644 Mastodynia: Secondary | ICD-10-CM

## 2018-01-19 NOTE — Telephone Encounter (Signed)
Copied from CRM #[id number]. Topic: Referral - [phone] >> Jan 19, 2018 12:17 PM Floria RavelingStovall, Shana A wrote: Reason for CRM:   pt called in and stated that she would like a order sent to the breast center for a Mammogram.  She is having pain in left breast for a few months.  Pt feels like the pain has increased.   Would like order sent to breast center   Best number [phone]

## 2018-01-20 ENCOUNTER — Telehealth: Payer: Self-pay | Admitting: *Deleted

## 2018-01-20 NOTE — Telephone Encounter (Signed)
Pt notified and verbalized understanding.

## 2018-01-20 NOTE — Telephone Encounter (Signed)
Patient called c/o breast pain, called breast center told to call and get order from office. I explained she must make visit with provider first. Has annual exam scheduled on 01/27/18. I checked with appointment desk and patient can come 01/23/18 @ 11:00am. Pt will come in for breast problem only.

## 2018-01-20 NOTE — Telephone Encounter (Signed)
Please notify the patient that I have ordered a diagnostic mammogram. She will be contacted by Radiology.

## 2018-01-20 NOTE — Telephone Encounter (Signed)
Please advise 

## 2018-01-22 ENCOUNTER — Encounter: Payer: Self-pay | Admitting: Obstetrics & Gynecology

## 2018-01-22 ENCOUNTER — Ambulatory Visit (INDEPENDENT_AMBULATORY_CARE_PROVIDER_SITE_OTHER): Payer: 59 | Admitting: Obstetrics & Gynecology

## 2018-01-22 VITALS — BP 132/86

## 2018-01-22 DIAGNOSIS — N644 Mastodynia: Secondary | ICD-10-CM | POA: Diagnosis not present

## 2018-01-22 NOTE — Progress Notes (Signed)
    Helen AweLiesa L Greene 12/18/1970 [id number]        47 y.o.  G3P2A1L2  RP: Left external quadrant tenderness/pain x 1 month  HPI: Left external quadrant tenderness and pain for 1 months.  No lump felt.  Patient was worried so she was palpating the tender area all the time.  No redness.  No nipple discharge.  No fever.  Scre[facility] M[facility]/2015 [facility] at [facility].   OB History  Gravida Para Term Preterm AB Living  3 2     1 2   SAB TAB Ectopic Multiple Live Births      1        # Outcome Date GA Lbr Len/2nd Weight Sex Delivery Anes PTL Lv  3 Ectopic           2 Para           1 Para             Past medical history,surgical history, problem list, medications, allergies, family history and social history were all reviewed and documented in the EPIC chart.   Directed ROS with pertinent positives and negatives documented in the history of present illness/assessment and plan.  Exam:  Vitals:   01/22/18 1103  BP: 132/86   General appearance:  Normal  Right breast normal.  No Rt Axillary LN felt.  Left breast:  No lump felt.  Tender at mid external area.  Skin normal.  No erythema.  No nipple discharge.  No Lt Axillary LN felt.   Assessment/Plan:  47 y.o. [id number]   1. Pain of left breast Left external quadrant tenderness and pain for 1 month.  Patient was constantly palpating because of worries.  Bilateral breast exam otherwise normal.  Will proceed with a left diagnostic mammogram and ultrasound.  Will do a right screening mammogram as patient is overdue.  Recommend not touching her breasts until the appointment for the mammography.  Counseling on above issues and coordination of care more than 50% for 15 minutes.  Genia DelMarie-Lyne Belle Charlie MD, 11:27 AM 01/22/2018

## 2018-01-23 ENCOUNTER — Telehealth: Payer: Self-pay | Admitting: *Deleted

## 2018-01-23 ENCOUNTER — Encounter: Payer: Self-pay | Admitting: Obstetrics & Gynecology

## 2018-01-23 ENCOUNTER — Encounter: Payer: Self-pay | Admitting: *Deleted

## 2018-01-23 DIAGNOSIS — N644 Mastodynia: Secondary | ICD-10-CM

## 2018-01-23 NOTE — Patient Instructions (Addendum)
1. Pain of left breast Left external quadrant tenderness and pain for 1 month.  Patient was constantly palpating because of worries.  Bilateral breast exam otherwise normal.  Will proceed with a left diagnostic mammogram and ultrasound.  Will do a right screening mammogram as patient is overdue.  Recommend not touching her breasts until the appointment for the mammography.  Helen RowerLiesa, it was a pleasure seeing you today!

## 2018-01-23 NOTE — Telephone Encounter (Signed)
-----   Message from Genia DelMarie-Lyne Lavoie, MD sent at 01/22/2018 11:42 AM EDT ----- Regarding: Left breast tenderness/pain Left external quadrant tenderness/pain increasing x 1 month at 3-4 O'clock.  Constantly palpating the area because she is worried.  No nodule or mass felt.  Left Dx Mammo/US and Right Screening Mammo.

## 2018-01-23 NOTE — Telephone Encounter (Signed)
Order placed at breast center,sent my chart message for patient to call and schedule.

## 2018-01-27 ENCOUNTER — Encounter: Payer: Self-pay | Admitting: Obstetrics & Gynecology

## 2018-01-27 ENCOUNTER — Telehealth: Payer: Self-pay | Admitting: *Deleted

## 2018-01-27 ENCOUNTER — Ambulatory Visit (INDEPENDENT_AMBULATORY_CARE_PROVIDER_SITE_OTHER): Payer: 59 | Admitting: Obstetrics & Gynecology

## 2018-01-27 VITALS — BP 130/70 | Ht 60.0 in | Wt 201.0 lb

## 2018-01-27 DIAGNOSIS — Z6839 Body mass index (BMI) 39.0-39.9, adult: Secondary | ICD-10-CM | POA: Diagnosis not present

## 2018-01-27 DIAGNOSIS — K644 Residual hemorrhoidal skin tags: Secondary | ICD-10-CM | POA: Diagnosis not present

## 2018-01-27 DIAGNOSIS — N644 Mastodynia: Secondary | ICD-10-CM | POA: Diagnosis not present

## 2018-01-27 DIAGNOSIS — Z113 Encounter for screening for infections with a predominantly sexual mode of transmission: Secondary | ICD-10-CM | POA: Diagnosis not present

## 2018-01-27 DIAGNOSIS — Z90710 Acquired absence of both cervix and uterus: Secondary | ICD-10-CM

## 2018-01-27 DIAGNOSIS — Z01419 Encounter for gynecological examination (general) (routine) without abnormal findings: Secondary | ICD-10-CM

## 2018-01-27 DIAGNOSIS — E6609 Other obesity due to excess calories: Secondary | ICD-10-CM

## 2018-01-27 MED ORDER — PHENTERMINE HCL 37.5 MG PO CAPS: 37.5000 mg | ORAL_CAPSULE | ORAL | 2 refills | Status: DC

## 2018-01-27 NOTE — Telephone Encounter (Signed)
-----   Message from Genia DelMarie-Lyne Lavoie, MD sent at 01/27/2018 12:46 PM EDT ----- Regarding: Refer to General surgeon External hemorrhoids.  Patient desires excision.

## 2018-01-27 NOTE — Progress Notes (Signed)
Helen Greene 12/18/1970 [id number]   History:    47 y.o. G3P2A1L2 Divorced  RP:  Established patient presenting for annual gyn exam   HPI:  S/P Total Hysterectomy.  No menopausal Sx.  No pelvic pain.  No pain with IC. Sexually active, but not in a stable relationship.  Urine wnl, but currently passing left kidney stones, seen by Urologist.  C/O difficulty wiping after a BM because of external hemorrhoids, would like to have them excised.  Breasts Rt normal, Lt intermittent external pain.  BMI 39.26.  Past medical history,surgical history, family history and social history were all reviewed and documented in the EPIC chart.  Gynecologic History Patient's last menstrual period was 09/07/2015 (exact date). Contraception: status post hysterectomy Last Pap: 01/2017. Results were: Negative, HPV HR neg Last mammogram: 2017. Results were: normal Bone Density: Never Colonoscopy: Never  Obstetric History OB History  Gravida Para Term Preterm AB Living  3 2     1 2   SAB TAB Ectopic Multiple Live Births      1        # Outcome Date GA Lbr Len/2nd Weight Sex Delivery Anes PTL Lv  3 Ectopic           2 Para           1 Para              ROS: A ROS was performed and pertinent positives and negatives are included in the history.  GENERAL: No fevers or chills. HEENT: No change in vision, no earache, sore throat or sinus congestion. NECK: No pain or stiffness. CARDIOVASCULAR: No chest pain or pressure. No palpitations. PULMONARY: No shortness of breath, cough or wheeze. GASTROINTESTINAL: No abdominal pain, nausea, vomiting or diarrhea, melena or bright red blood per rectum. GENITOURINARY: No urinary frequency, urgency, hesitancy or dysuria. MUSCULOSKELETAL: No joint or muscle pain, no back pain, no recent trauma. DERMATOLOGIC: No rash, no itching, no lesions. ENDOCRINE: No polyuria, polydipsia, no heat or cold intolerance. No recent change in weight. HEMATOLOGICAL: No anemia or easy bruising or  bleeding. NEUROLOGIC: No headache, seizures, numbness, tingling or weakness. PSYCHIATRIC: No depression, no loss of interest in normal activity or change in sleep pattern.     Exam:   BP 130/70   Ht 5' (1.524 m)   Wt 201 lb (91.2 kg)   LMP 09/07/2015 (Exact Date)   BMI 39.26 kg/m   Body mass index is 39.26 kg/m.  General appearance : Well developed well nourished female. No acute distress HEENT: Eyes: no retinal hemorrhage or exudates,  Neck supple, trachea midline, no carotid bruits, no thyroidmegaly Lungs: Clear to auscultation, no rhonchi or wheezes, or rib retractions  Heart: Regular rate and rhythm, no murmurs or gallops Breast:Examined in sitting and supine position were symmetrical in appearance, no palpable masses or tenderness,  no skin retraction, no nipple inversion, no nipple discharge, no skin discoloration, no axillary or supraclavicular lymphadenopathy Abdomen: no palpable masses or tenderness, no rebound or guarding Extremities: no edema or skin discoloration or tenderness  Pelvic: Vulva: Normal             Vagina: No gross lesions or discharge.  Pap reflex/Gono-Chlam done  Cervix/Uterus absent  Adnexa  Without masses or tenderness  Perianal area:  External hemorrhoids   Assessment/Plan:  47 y.o. female for annual exam   1. Encounter for routine gynecological examination with Papanicolaou smear of cervix Normal gynecologic exam.  Pap reflex done.  Breast  exam normal.  Will schedule screening mammogram now.  Health labs with family physician. - Pap IG, CT/NG w/ reflex HPV when ASC-U  2. History of total hysterectomy  3. Pain of left breast Normal breast exam today.  No change in symptoms since visit with me January 22, 2018.  Lt Dx mammo/US scheduled this Friday 01/30/2018.  4. Screen for STD (sexually transmitted disease) Strict condom use strongly recommended. - HIV antibody (with reflex) - RPR - Hepatitis C Antibody - Hepatitis B Surface AntiGEN - Pap  IG, CT/NG w/ reflex HPV when ASC-U  5. External hemorrhoids Nonthrombosed nonbleeding external hemorrhoids.  Patient requests referral to general surgeon for excision because of the difficulty wiping after each stool.  6. Class 2 obesity due to excess calories without serious comorbidity with body mass index (BMI) of 39.0 to 39.9 in adult Recommend a low calorie/low carb diet such as [employer] diet.  I aerobic physical activities 5 times a week and weightlifting every 2 days.  Will assist with an appetite suppressant for the first 3 months.  No contraindication to phentermine.  Blood pressure normal at 130/70 today.  Phentermine 37.5 mg capsule daily prescribed.  Patient will follow-up in 3 months to reassess and manage her weight loss.  Other orders - phentermine 37.5 MG capsule; Take 1 capsule (37.5 mg total) by mouth every morning.  Counseling on above issues and coordination of care more than 50% for 10 minutes.  Genia DelMarie-Lyne Wanette Robison MD, 12:19 PM 01/27/2018

## 2018-01-27 NOTE — Telephone Encounter (Signed)
Appointment on 01/30/18 @ 8:20am

## 2018-01-27 NOTE — Telephone Encounter (Signed)
Referral placed at [facility] they will call to schedule. 

## 2018-01-28 LAB — HIV ANTIBODY (ROUTINE TESTING W REFLEX): HIV 1&2 Ab, 4th Generation: NONREACTIVE

## 2018-01-28 LAB — HEPATITIS C ANTIBODY
Hepatitis C Ab: NONREACTIVE
SIGNAL TO CUT-OFF: 0.01 (ref <1.00)

## 2018-01-28 LAB — RPR: RPR Ser Ql: NONREACTIVE

## 2018-01-28 LAB — HEPATITIS B SURFACE ANTIGEN: Hepatitis B Surface Ag: NONREACTIVE

## 2018-01-29 ENCOUNTER — Encounter: Payer: Self-pay | Admitting: Obstetrics & Gynecology

## 2018-01-29 ENCOUNTER — Encounter: Payer: Self-pay | Admitting: Emergency Medicine

## 2018-01-29 ENCOUNTER — Ambulatory Visit (INDEPENDENT_AMBULATORY_CARE_PROVIDER_SITE_OTHER): Payer: 59 | Admitting: Emergency Medicine

## 2018-01-29 VITALS — BP 132/82 | HR 78 | Ht 59.0 in | Wt 204.0 lb

## 2018-01-29 DIAGNOSIS — R0602 Shortness of breath: Secondary | ICD-10-CM

## 2018-01-29 DIAGNOSIS — R490 Dysphonia: Secondary | ICD-10-CM | POA: Diagnosis not present

## 2018-01-29 DIAGNOSIS — R918 Other nonspecific abnormal finding of lung field: Secondary | ICD-10-CM

## 2018-01-29 DIAGNOSIS — R0609 Other forms of dyspnea: Secondary | ICD-10-CM

## 2018-01-29 DIAGNOSIS — R0600 Dyspnea, unspecified: Secondary | ICD-10-CM | POA: Diagnosis not present

## 2018-01-29 MED ORDER — LORATADINE 10 MG PO TABS: 10.0000 mg | ORAL_TABLET | Freq: Every day | ORAL | 5 refills | Status: DC

## 2018-01-29 MED ORDER — OMEPRAZOLE 20 MG PO CPDR: 20.0000 mg | DELAYED_RELEASE_CAPSULE | Freq: Every day | ORAL | 5 refills | Status: DC

## 2018-01-29 MED ORDER — FLUTICASONE PROPIONATE 50 MCG/ACT NA SUSP: 2.0000 | Freq: Every day | NASAL | 5 refills | Status: DC

## 2018-01-29 NOTE — Progress Notes (Signed)
Subjective:    Patient ID: Helen Greene, female    DOB: April 05, 1971, 47 y.o.   MRN: [id number]  HPI [age] year old never smoker with a history of allergic rhinitis, OSA (no CPAP), RLS, anxiety d/o.  She has been evaluated by ENT for sinusitis, sore throat, tonsillar stones and underwent tonsillectomy 07/2017 by Dr. Pollyann Kennedyosen.  Course was unfortunately complicated by pneumococcal sepsis in the setting of right upper lobe pneumonia, AKI. She was discharged 08/19/17.  Repeat CT chest 11/13/2017 performed for dyspnea, fatigue showed no evidence of PE, resolving right upper lobe hazy opacity, small pleural-based nodules in the right middle lobe up to 5 mm in size.  She was seen in our office 12/29/17 for persistent cough. Was treated with Augmentin and prednisone at that visit to treat the residual RUL opacity (then subsequently fluconazole for yeast infxn). Her lisinopril was stopped. She took omeprazole for 14 days.   She is complaining of persistent throat clearing, dyspnea. She still has some sinus drainage, clear. Rarely blows her nose. She is fatigued. Still dealing with kidney stones.   Review of Systems  Past Medical History:  Diagnosis Date  . Allergy   . Anxiety   . Depression   . Elevated white blood cell count   . Elevated white blood cell count 09/25/2011  . GERD (gastroesophageal reflux disease)   . Headache    Migraine  . Hypertension   . Kidney stone   . Neuromuscular disorder (HCC)    carpel tunnel disease  . Pneumonia 08/15/2017  . Restless leg syndrome   . Shortness of breath dyspnea    with exertion  . Sleep apnea    does not have CPAP machine, mostly affects back sleeping was encouraged to sleep on her sides  . Tonsillar calculus      Family History  Problem Relation Age of Onset  . Hypertension Mother   . Heart failure Mother   . Cancer Mother   . Heart disease Mother   . Heart disease Brother   . Hypertension Brother   . Cancer Father        Leukemia  . Hypertension  Father   . Cancer Sister   . Diabetes Other   . Colon cancer Neg Hx   . Esophageal cancer Neg Hx   . Rectal cancer Neg Hx   . Stomach cancer Neg Hx      Social History   Socioeconomic History  . Marital status: Divorced    Spouse name: Not on file  . Number of children: Not on file  . Years of education: Not on file  . Highest education level: Not on file  Occupational History  . Not on file  Social Needs  . Financial resource strain: Not on file  . Food insecurity:    Worry: Not on file    Inability: Not on file  . Transportation needs:    Medical: Not on file    Non-medical: Not on file  Tobacco Use  . Smoking status: Never Smoker  . Smokeless tobacco: Never Used  Substance and Sexual Activity  . Alcohol use: Yes    Alcohol/week: 0.0 standard drinks    Comment: social  . Drug use: No  . Sexual activity: Not Currently    Partners: Male    Comment: 1ST intercourse- 18, partners-  2   Lifestyle  . Physical activity:    Days per week: Not on file    Minutes per session: Not on file  .  Stress: Not on file  Relationships  . Social connections:    Talks on phone: Not on file    Gets together: Not on file    Attends religious service: Not on file    Active member of club or organization: Not on file    Attends meetings of clubs or organizations: Not on file    Relationship status: Not on file  . Intimate partner violence:    Fear of current or ex partner: Not on file    Emotionally abused: Not on file    Physically abused: Not on file    Forced sexual activity: Not on file  Other Topics Concern  . Not on file  Social History Narrative  . Not on file     Allergies  Allergen Reactions  . Bee Venom Hives and Swelling  . Enablex [Darifenacin Hydrobromide] Other (See Comments)    Phlebitis.  . Erythromycin Hives  . Macrobid [Nitrofurantoin Monohyd Macro] Nausea And Vomiting  . Metronidazole     GI upset with tablet form  . Nitrofuran Derivatives Nausea And  Vomiting  . Risperidone And Related Swelling     Outpatient Medications Prior to Visit  Medication Sig Dispense Refill  . Aspirin Effervescent (ALKA-SELTZER ORIGINAL PO) Take by mouth as needed.    Marland Kitchen. buPROPion (WELLBUTRIN XL) 150 MG 24 hr tablet Take 450 mg by mouth at bedtime.   1  . clobetasol cream (TEMOVATE) 0.05 % Apply 1 application topically 2 (two) times daily. Thin application to left vulva. 30 g 0  . cyclobenzaprine (FLEXERIL) 5 MG tablet Take 1 tablet (5 mg total) by mouth 3 (three) times daily as needed for muscle spasms. 90 tablet 6  . escitalopram (LEXAPRO) 10 MG tablet TAKE 1 TABLET(10 MG) BY MOUTH DAILY 90 tablet 0  . fluconazole (DIFLUCAN) 100 MG tablet Take 1 tablet (100 mg total) by mouth daily. 3 tablet 0  . folic acid (FOLVITE) 1 MG tablet Take 1 tablet (1 mg total) by mouth daily. 90 tablet 1  . hydrOXYzine (VISTARIL) 25 MG capsule Take 75 mg by mouth at bedtime.   2  . mirabegron ER (MYRBETRIQ) 50 MG TB24 tablet Take 50 mg by mouth daily.    Marland Kitchen. nystatin (MYCOSTATIN/NYSTOP) powder Apply topically 2 (two) times daily. 15 g 1  . ondansetron (ZOFRAN) 4 MG tablet Take 1 tablet (4 mg total) by mouth every 8 (eight) hours as needed for nausea or vomiting. 20 tablet 0  . phentermine 37.5 MG capsule Take 1 capsule (37.5 mg total) by mouth every morning. 30 capsule 2  . pramipexole (MIRAPEX) 1.5 MG tablet TAKE 1 TABLET(1.5 MG) BY MOUTH AT BEDTIME 90 tablet 0  . predniSONE (DELTASONE) 10 MG tablet Take  4 each am x 2 days,   2 each am x 2 days,  1 each am x 2 days and stop 14 tablet 0  . promethazine (PHENERGAN) 25 MG suppository Place 1 suppository (25 mg total) rectally every 6 (six) hours as needed for nausea or vomiting. 12 suppository 1  . tamsulosin (FLOMAX) 0.4 MG CAPS capsule Take 0.4 mg by mouth.    . telmisartan-hydrochlorothiazide (MICARDIS HCT) 40-12.5 MG tablet Take 1 tablet by mouth daily. 30 tablet 11  . valACYclovir (VALTREX) 1000 MG tablet TAKE 2 TABLETS BY MOUTH  EVERY 12 HOURS AS NEEDED FOR ONE DAY WITH OUTBREAK 20 tablet 0   No facility-administered medications prior to visit.         Objective:  Physical Exam Vitals:   01/29/18 1606  BP: 132/82  Pulse: 78  SpO2: 98%  Weight: 204 lb (92.5 kg)  Height: 4\' 11"  (1.499 m)   Gen: Pleasant, overwt woman, in no distress,  normal affect  ENT: No lesions,  mouth clear,  oropharynx clear, no postnasal drip  Neck: No JVD, no stridor  Lungs: No use of accessory muscles, no dullness to percussion, clear without rales or rhonchi  Cardiovascular: RRR, heart sounds normal, no murmur or gallops, trace pretibial peripheral edema  Musculoskeletal: No deformities, no cyanosis or clubbing  Neuro: alert, non focal  Skin: Warm, no lesions or rashes       Assessment & Plan:  Hoarseness Benefited from discontinuation of ACE inhibitor.  Likely has components of GERD and allergic rhinitis affecting her upper airway.  The allergic rhinitis is more obvious, the GERD may be silent.  We will treat both, discussed voice rest and avoiding throat clearing.  Pulmonary function testing to evaluate airflows, repeat chest imaging as planned  Dyspnea Plan for full PFT.  Suspect at least some component of restriction and deconditioning.  Need to rule out obstructive lung disease.  Abnormal CT scan of lung Resolving right upper lobe infiltrate noted on CT 11/13/2017.  I believe she needs a repeat CT scan of the chest to ensure resolution, to find any areas of persistent scarring, bronchiectasis, etc.  Levy Pupa, MD, PhD 01/29/2018, 5:18 PM [facility] [phone] or if no answer [phone]

## 2018-01-29 NOTE — Assessment & Plan Note (Signed)
Plan for full PFT.  Suspect at least some component of restriction and deconditioning.  Need to rule out obstructive lung disease.

## 2018-01-29 NOTE — Patient Instructions (Signed)
1. Encounter for routine gynecological examination with Papanicolaou smear of cervix Normal gynecologic exam.  Pap reflex done.  Breast exam normal.  Will schedule screening mammogram now.  Health labs with family physician. - Pap IG, CT/NG w/ reflex HPV when ASC-U  2. History of total hysterectomy  3. Pain of left breast Normal breast exam today.  No change in symptoms since visit with me January 22, 2018.  Lt Dx mammo/US scheduled this Friday 01/30/2018.  4. Screen for STD (sexually transmitted disease) Strict condom use strongly recommended. - HIV antibody (with reflex) - RPR - Hepatitis C Antibody - Hepatitis B Surface AntiGEN - Pap IG, CT/NG w/ reflex HPV when ASC-U  5. External hemorrhoids Nonthrombosed nonbleeding external hemorrhoids.  Patient requests referral to general surgeon for excision because of the difficulty wiping after each stool.  6. Class 2 obesity due to excess calories without serious comorbidity with body mass index (BMI) of 39.0 to 39.9 in adult Recommend a low calorie/low carb diet such as [employer] diet.  I aerobic physical activities 5 times a week and weightlifting every 2 days.  Will assist with an appetite suppressant for the first 3 months.  No contraindication to phentermine.  Blood pressure normal at 130/70 today.  Phentermine 37.5 mg capsule daily prescribed.  Patient will follow-up in 3 months to reassess and manage her weight loss.  Other orders - phentermine 37.5 MG capsule; Take 1 capsule (37.5 mg total) by mouth every morning.  Sofie RowerLiesa, it was a pleasure seeing you today!  I will inform you of your results as soon as they are available.

## 2018-01-29 NOTE — Patient Instructions (Addendum)
Please start fluticasone nasal spray, 2 sprays each nostril daily Please start loratadine 10mg  daily Please restart omeprazole 20mg  daily.  We will perform full pulmonary function testing  We will repeat your CT chest without contrast  We may decide to revisit treating sleep apnea in the future.  Follow with Dr Delton CoombesByrum next available after your testing to review the results together.

## 2018-01-29 NOTE — Assessment & Plan Note (Signed)
Resolving right upper lobe infiltrate noted on CT 11/13/2017.  I believe she needs a repeat CT scan of the chest to ensure resolution, to find any areas of persistent scarring, bronchiectasis, etc.

## 2018-01-29 NOTE — Assessment & Plan Note (Signed)
Benefited from discontinuation of ACE inhibitor.  Likely has components of GERD and allergic rhinitis affecting her upper airway.  The allergic rhinitis is more obvious, the GERD may be silent.  We will treat both, discussed voice rest and avoiding throat clearing.  Pulmonary function testing to evaluate airflows, repeat chest imaging as planned

## 2018-01-30 ENCOUNTER — Ambulatory Visit: Admission: RE | Admit: 2018-01-30 | Payer: 59 | Source: Ambulatory Visit

## 2018-01-30 ENCOUNTER — Ambulatory Visit
Admission: RE | Admit: 2018-01-30 | Discharge: 2018-01-30 | Disposition: A | Discharge status: Home / Self Care | Payer: 59 | Source: Ambulatory Visit | Attending: Obstetrics & Gynecology | Admitting: Obstetrics & Gynecology

## 2018-01-30 ENCOUNTER — Encounter: Payer: Self-pay | Admitting: *Deleted

## 2018-01-30 DIAGNOSIS — N644 Mastodynia: Secondary | ICD-10-CM

## 2018-01-30 LAB — PAP IG, CT-NG, RFX HPV ASCU
CHLAMYDIA, NUC. ACID AMP: NEGATIVE
Gonococcus by Nucleic Acid Amp: NEGATIVE
PAP Smear Comment: 0

## 2018-02-02 ENCOUNTER — Telehealth: Payer: Self-pay | Admitting: Emergency Medicine

## 2018-02-02 ENCOUNTER — Ambulatory Visit: Payer: 59 | Admitting: Internal Medicine

## 2018-02-03 ENCOUNTER — Inpatient Hospital Stay: Admission: RE | Admit: 2018-02-03 | Payer: 59 | Source: Ambulatory Visit

## 2018-02-03 ENCOUNTER — Other Ambulatory Visit: Payer: Self-pay | Admitting: Family Medicine

## 2018-02-03 DIAGNOSIS — N644 Mastodynia: Secondary | ICD-10-CM

## 2018-02-04 NOTE — Telephone Encounter (Signed)
Did precert through [employer] no pa req Tobe SosSally E Ottinger

## 2018-02-05 NOTE — Telephone Encounter (Signed)
Patient scheduled on 02/23/18 with Dr. Michaell CowingGross

## 2018-02-07 ENCOUNTER — Other Ambulatory Visit: Payer: Self-pay | Admitting: Family Medicine

## 2018-02-12 ENCOUNTER — Ambulatory Visit (INDEPENDENT_AMBULATORY_CARE_PROVIDER_SITE_OTHER)
Admission: RE | Admit: 2018-02-12 | Discharge: 2018-02-12 | Disposition: A | Discharge status: Home / Self Care | Payer: 59 | Source: Ambulatory Visit | Attending: Emergency Medicine | Admitting: Emergency Medicine

## 2018-02-12 DIAGNOSIS — R918 Other nonspecific abnormal finding of lung field: Secondary | ICD-10-CM

## 2018-02-18 ENCOUNTER — Ambulatory Visit (INDEPENDENT_AMBULATORY_CARE_PROVIDER_SITE_OTHER): Payer: 59 | Admitting: Emergency Medicine

## 2018-02-18 ENCOUNTER — Encounter: Payer: Self-pay | Admitting: Emergency Medicine

## 2018-02-18 DIAGNOSIS — R490 Dysphonia: Secondary | ICD-10-CM

## 2018-02-18 DIAGNOSIS — R0609 Other forms of dyspnea: Secondary | ICD-10-CM | POA: Diagnosis not present

## 2018-02-18 DIAGNOSIS — R0600 Dyspnea, unspecified: Secondary | ICD-10-CM | POA: Diagnosis not present

## 2018-02-18 DIAGNOSIS — R918 Other nonspecific abnormal finding of lung field: Secondary | ICD-10-CM

## 2018-02-18 LAB — PULMONARY FUNCTION TEST
DL/VA % PRED: 115 %
DL/VA: 4.81 ml/min/mmHg/L
DLCO unc % pred: 112 %
DLCO unc: 20.44 ml/min/mmHg
FEF 25-75 POST: 4.23 L/s
FEF 25-75 Pre: 3.47 L/sec
FEF2575-%CHANGE-POST: 21 %
FEF2575-%PRED-POST: 163 %
FEF2575-%PRED-PRE: 133 %
FEV1-%Change-Post: 3 %
FEV1-%Pred-Post: 116 %
FEV1-%Pred-Pre: 111 %
FEV1-Post: 2.84 L
FEV1-Pre: 2.73 L
FEV1FVC-%Change-Post: 5 %
FEV1FVC-%PRED-PRE: 105 %
FEV6-%CHANGE-POST: -1 %
FEV6-%Pred-Post: 105 %
FEV6-%Pred-Pre: 107 %
FEV6-POST: 3.13 L
FEV6-Pre: 3.19 L
FEV6FVC-%Pred-Post: 102 %
FEV6FVC-%Pred-Pre: 102 %
FVC-%Change-Post: -1 %
FVC-%Pred-Post: 102 %
FVC-%Pred-Pre: 104 %
FVC-Post: 3.13 L
FVC-Pre: 3.19 L
PRE FEV1/FVC RATIO: 86 %
Post FEV1/FVC ratio: 91 %
Post FEV6/FVC ratio: 100 %
Pre FEV6/FVC Ratio: 100 %
RV % pred: 138 %
RV: 2.1 L
TLC % PRED: 126 %
TLC: 5.53 L

## 2018-02-18 MED ORDER — AZELASTINE HCL 0.1 % NA SOLN: 2.0000 | Freq: Three times a day (TID) | NASAL | 5 refills | Status: DC

## 2018-02-18 NOTE — Progress Notes (Signed)
PFT done today. 

## 2018-02-18 NOTE — Assessment & Plan Note (Signed)
Your CT scan of the chest shows that your right upper lobe pneumonia has completely resolved and that your pulmonary nodules are stable in size and appearance.  This is good news.

## 2018-02-18 NOTE — Progress Notes (Signed)
Subjective:    Patient ID: Helen Greene, female    DOB: 08-21-1970, 48 y.o.   MRN: [id number]  HPI 48 year old never smoker with a history of allergic rhinitis, OSA (no CPAP), RLS, anxiety d/o.  She has been evaluated by ENT for sinusitis, sore throat, tonsillar stones and underwent tonsillectomy 07/2017 by Dr. Pollyann Kennedy.  Course was unfortunately complicated by pneumococcal sepsis in the setting of right upper lobe pneumonia, AKI. She was discharged 08/19/17.  Repeat CT chest 11/13/2017 performed for dyspnea, fatigue showed no evidence of PE, resolving right upper lobe hazy opacity, small pleural-based nodules in the right middle lobe up to 5 mm in size.  She was seen in our office 12/29/17 for persistent cough. Was treated with Augmentin and prednisone at that visit to treat the residual RUL opacity (then subsequently fluconazole for yeast infxn). Her lisinopril was stopped. She took omeprazole for 14 days.   She is complaining of persistent throat clearing, dyspnea. She still has some sinus drainage, clear. Rarely blows her nose. She is fatigued. Still dealing with kidney stones.   ROV 02/18/18 --this is a follow-up visit for never smoker with allergic rhinitis, untreated OSA.  She has allergic rhinitis and has dealt with a lot of drainage, throat clearing, associated dyspnea.  Was treated for a right upper lobe pneumococcal pneumonia earlier this year.  We repeated a CT scan of her chest 02/12/2018 which I have personally reviewed.  This shows interval resolution of her hazy right upper lobe infiltrate.  She has some right upper lobe subpleural pulmonary nodules 4 mm that how are unchanged and should not need any further follow-up.  She underwent pulmonary function testing today for evaluation of her dyspnea.  This showed normal spirometry without bronchodilators response, normal flow volume loops, hyperinflated lung volumes and a normal diffusion capacity.  Review of Systems  Past Medical History:    Diagnosis Date  . Allergy   . Anxiety   . Depression   . Elevated white blood cell count   . Elevated white blood cell count 09/25/2011  . GERD (gastroesophageal reflux disease)   . Headache    Migraine  . Hypertension   . Kidney stone   . Neuromuscular disorder (HCC)    carpel tunnel disease  . Pneumonia 08/15/2017  . Restless leg syndrome   . Shortness of breath dyspnea    with exertion  . Sleep apnea    does not have CPAP machine, mostly affects back sleeping was encouraged to sleep on her sides  . Tonsillar calculus      Family History  Problem Relation Age of Onset  . Hypertension Mother   . Heart failure Mother   . Cancer Mother   . Heart disease Mother   . Heart disease Brother   . Hypertension Brother   . Cancer Father        Leukemia  . Hypertension Father   . Cancer Sister   . Breast cancer Sister   . Diabetes Other   . Colon cancer Neg Hx   . Esophageal cancer Neg Hx   . Rectal cancer Neg Hx   . Stomach cancer Neg Hx      Social History   Socioeconomic History  . Marital status: Divorced    Spouse name: Not on file  . Number of children: Not on file  . Years of education: Not on file  . Highest education level: Not on file  Occupational History  . Not on file  Social Needs  . Financial resource strain: Not on file  . Food insecurity:    Worry: Not on file    Inability: Not on file  . Transportation needs:    Medical: Not on file    Non-medical: Not on file  Tobacco Use  . Smoking status: Never Smoker  . Smokeless tobacco: Never Used  Substance and Sexual Activity  . Alcohol use: Yes    Alcohol/week: 0.0 standard drinks    Comment: social  . Drug use: No  . Sexual activity: Not Currently    Partners: Male    Comment: 1ST intercourse- 18, partners-  2   Lifestyle  . Physical activity:    Days per week: Not on file    Minutes per session: Not on file  . Stress: Not on file  Relationships  . Social connections:    Talks on phone:  Not on file    Gets together: Not on file    Attends religious service: Not on file    Active member of club or organization: Not on file    Attends meetings of clubs or organizations: Not on file    Relationship status: Not on file  . Intimate partner violence:    Fear of current or ex partner: Not on file    Emotionally abused: Not on file    Physically abused: Not on file    Forced sexual activity: Not on file  Other Topics Concern  . Not on file  Social History Narrative  . Not on file     Allergies  Allergen Reactions  . Bee Venom Hives and Swelling  . Enablex [Darifenacin Hydrobromide] Other (See Comments)    Phlebitis.  . Erythromycin Hives  . Macrobid [Nitrofurantoin Monohyd Macro] Nausea And Vomiting  . Metronidazole     GI upset with tablet form  . Nitrofuran Derivatives Nausea And Vomiting  . Risperidone And Related Swelling     Outpatient Medications Prior to Visit  Medication Sig Dispense Refill  . Aspirin Effervescent (ALKA-SELTZER ORIGINAL PO) Take by mouth as needed.    Marland Kitchen buPROPion (WELLBUTRIN XL) 150 MG 24 hr tablet Take 450 mg by mouth at bedtime.   1  . clobetasol cream (TEMOVATE) 0.05 % Apply 1 application topically 2 (two) times daily. Thin application to left vulva. 30 g 0  . cyclobenzaprine (FLEXERIL) 5 MG tablet Take 1 tablet (5 mg total) by mouth 3 (three) times daily as needed for muscle spasms. 90 tablet 6  . escitalopram (LEXAPRO) 10 MG tablet TAKE 1 TABLET(10 MG) BY MOUTH DAILY 90 tablet 0  . fluticasone (FLONASE) 50 MCG/ACT nasal spray Place 2 sprays into both nostrils daily. 16 g 5  . folic acid (FOLVITE) 1 MG tablet Take 1 tablet (1 mg total) by mouth daily. Patient needs office visit for more refills 30 tablet 0  . hydrOXYzine (VISTARIL) 25 MG capsule Take 75 mg by mouth at bedtime.   2  . loratadine (CLARITIN) 10 MG tablet Take 1 tablet (10 mg total) by mouth daily. 30 tablet 5  . mirabegron ER (MYRBETRIQ) 50 MG TB24 tablet Take 50 mg by  mouth daily.    Marland Kitchen nystatin (MYCOSTATIN/NYSTOP) powder Apply topically 2 (two) times daily. 15 g 1  . omeprazole (PRILOSEC) 20 MG capsule Take 1 capsule (20 mg total) by mouth daily. 30 capsule 5  . ondansetron (ZOFRAN) 4 MG tablet Take 1 tablet (4 mg total) by mouth every 8 (eight) hours as needed for nausea  or vomiting. 20 tablet 0  . phentermine 37.5 MG capsule Take 1 capsule (37.5 mg total) by mouth every morning. 30 capsule 2  . pramipexole (MIRAPEX) 1.5 MG tablet TAKE 1 TABLET(1.5 MG) BY MOUTH AT BEDTIME 90 tablet 0  . promethazine (PHENERGAN) 25 MG suppository Place 1 suppository (25 mg total) rectally every 6 (six) hours as needed for nausea or vomiting. 12 suppository 1  . tamsulosin (FLOMAX) 0.4 MG CAPS capsule Take 0.4 mg by mouth.    . telmisartan-hydrochlorothiazide (MICARDIS HCT) 40-12.5 MG tablet Take 1 tablet by mouth daily. 30 tablet 11  . valACYclovir (VALTREX) 1000 MG tablet TAKE 2 TABLETS BY MOUTH EVERY 12 HOURS AS NEEDED FOR ONE DAY WITH OUTBREAK 20 tablet 0  . fluconazole (DIFLUCAN) 100 MG tablet Take 1 tablet (100 mg total) by mouth daily. 3 tablet 0  . predniSONE (DELTASONE) 10 MG tablet Take  4 each am x 2 days,   2 each am x 2 days,  1 each am x 2 days and stop 14 tablet 0   No facility-administered medications prior to visit.         Objective:   Physical Exam Vitals:   02/18/18 1616  BP: 110/68  Pulse: 98  SpO2: 98%  Weight: 197 lb (89.4 kg)  Height: 4' 11.5" (1.511 m)   Gen: Pleasant, overwt woman, in no distress,  normal affect, some throat clearing  ENT: No lesions,  mouth clear,  oropharynx clear, no postnasal drip  Neck: No JVD, no stridor  Lungs: No use of accessory muscles, no dullness to percussion, clear without rales or rhonchi  Cardiovascular: RRR, heart sounds normal, no murmur or gallops, trace pretibial peripheral edema  Musculoskeletal: No deformities, no cyanosis or clubbing  Neuro: alert, non focal  Skin: Warm, no lesions or  rashes       Assessment & Plan:  Abnormal CT scan of lung Your CT scan of the chest shows that your right upper lobe pneumonia has completely resolved and that your pulmonary nodules are stable in size and appearance.  This is good news.  Hoarseness Largely due to allergic rhinitis.  She did benefit from stopping ACE inhibitor.  Your pulmonary function testing is normal.  There is no evidence for asthma present.  We do not need to start any inhaled medication at this time. Please continue Flonase nasal spray 2 sprays each nostril once daily. Please continue loratadine 10 mg daily Please try starting Astelin nasal spray, 2 sprays each nostril 2-3 times daily if needed for nasal congestion. If you continue to have problems with nasal drainage, hoarseness, cough that I believe you will need to be referred for official allergy evaluation. Follow with Dr Delton Coombes as needed  Dyspnea Suspect mainly due to deconditioning.  Her pulmonary function testing today is normal.  Levy Pupa, MD, PhD 02/18/2018, 4:43 PM [facility] [phone] or if no answer [phone]

## 2018-02-18 NOTE — Assessment & Plan Note (Signed)
Largely due to allergic rhinitis.  She did benefit from stopping ACE inhibitor.  Your pulmonary function testing is normal.  There is no evidence for asthma present.  We do not need to start any inhaled medication at this time. Please continue Flonase nasal spray 2 sprays each nostril once daily. Please continue loratadine 10 mg daily Please try starting Astelin nasal spray, 2 sprays each nostril 2-3 times daily if needed for nasal congestion. If you continue to have problems with nasal drainage, hoarseness, cough that I believe you will need to be referred for official allergy evaluation. Follow with Dr Delton Coombes as needed

## 2018-02-18 NOTE — Patient Instructions (Signed)
Your CT scan of the chest shows that your right upper lobe pneumonia has completely resolved and that your pulmonary nodules are stable in size and appearance.  This is good news. Your pulmonary function testing is normal.  There is no evidence for asthma present.  We do not need to start any inhaled medication at this time. Please continue Flonase nasal spray 2 sprays each nostril once daily. Please continue loratadine 10 mg daily Please try starting Astelin nasal spray, 2 sprays each nostril 2-3 times daily if needed for nasal congestion. If you continue to have problems with nasal drainage, hoarseness, cough that I believe you will need to be referred for official allergy evaluation. Follow with Dr Delton Coombes as needed.

## 2018-02-18 NOTE — Assessment & Plan Note (Signed)
Suspect mainly due to deconditioning.  Her pulmonary function testing today is normal.

## 2018-02-26 ENCOUNTER — Other Ambulatory Visit: Payer: Self-pay | Admitting: Family Medicine

## 2018-03-23 ENCOUNTER — Encounter: Payer: Self-pay | Admitting: Surgery

## 2018-03-23 ENCOUNTER — Ambulatory Visit: Payer: Self-pay | Admitting: Surgery

## 2018-03-23 NOTE — H&P (Signed)
Helen Greene Documented: 03/23/2018 4:20 PM Location: [facility] Patient #: [id number] DOB: 20-Sep-1970 Single / Language: Helen Greene / Race: White Female  History of Present Illness Helen Hector MD; 03/23/2018 5:06 PM) The patient is a 47 year old female who presents with hemorrhoids. Note for "Hemorrhoids": ` ` ` Patient sent for surgical consultation at the request of Dr Dellis Filbert  Chief Complaint: Worsening external hemorrhoids ` ` The patient is a woman that had some irregular bowels. Upper abdominal complaints with negative upper endoscopy last year. Has had problems with hemorrhoids. Heart keep area clean. Irritating. Frustrating. Discuss with her gynecologist. Surgical consultation recommended. She notes that often has some leaking and soiling and irritation. Hard to keep the area clean when she wipes. Some occasional blood. Some stinging but nothing too severe. She is work to keep her bowels more regular. Usually moves her bowels once or twice a day. Did struggle with some occasional regular bowels in the past but that seems to be better. She's not had any prior hemorrhoidal interventions. She had a robotic hysterectomy couple years ago. Relatively uneventful. She did have nausea vomiting with dehydration and a pneumonia after her tonsillectomy. Resolved with antibiotics and IV fluids in 4 days. Seen by pulmonary and completely resolved. She is back to regular activity. On oxygen. Get a little short of breath after exercising, but can walk 30 minutes without difficulty. She is embarrassed by the challenges of hygiene but is frustrated despite her improvements in bowel function. She thinks she might have been diagnosed with irritable bowel but has not been an issue as much this year She recalls a colonoscopy done by [facility] gastro[facility] many years ago that was underwhelming.  No personal nor family history of GI/colon cancer, inflammatory bowel disease,  allergy such as Celiac Sprue, dietary/dairy problems, colitis, ulcers nor gastritis. No recent sick contacts/gastroenteritis. No travel outside the country. No changes in diet. No dysphagia to solids or liquids. No significant heartburn or reflux. No hematochezia, hematemesis, coffee ground emesis. No evidence of prior gastric/peptic ulceration.  (Review of systems as stated in this history (HPI) or in the review of systems. Otherwise all other 12 point ROS are negative) ` ` `   Allergies (Armen Ferguson, CMA; 03/23/2018 4:36 PM) BEE VENOM Erythromycin *DERMATOLOGICALS* Hives. Nitrofurantoin Monohyd Macro *URINARY ANTI-INFECTIVES* Flagyl *ANTI-INFECTIVE AGENTS - MISC.* risperiDONE *ANTIPSYCHOTICS/ANTIMANIC AGENTS* Swelling. Enablex *URINARY ANTISPASMODICS*  Medication History (Armen Ferguson, CMA; 03/23/2018 4:40 PM) Azelastine HCl (0.1% Solution, Nasal) Active. buPROPion HCl ER (XL) (150MG Tablet ER 24HR, Oral) Active. ClobetaPlus Cream (0.05 & 2.3% Kit, External) Active. Flexeril (5MG Tablet, Oral) Active. Escitalopram Oxalate (10MG Tablet, Oral) Active. Fluticasone Propionate (50MCG/ACT Suspension, Nasal) Active. Folic Acid (1MG Tablet, Oral) Active. hydrOXYzine Pamoate (25MG Capsule, Oral) Active. Loratadine (10MG Tablet, Oral) Active. Myrbetriq (50MG Tablet ER 24HR, Oral) Active. Nystat-Rx Active. Omeprazole (20MG Capsule DR, Oral) Active. Ondansetron HCl (4MG Tablet, Oral) Active. Phentermine HCl (37.5MG Capsule, Oral) Active. Pramipexole Dihydrochloride (1.5MG Tablet, Oral) Active. Promethegan (25MG Suppository, Rectal) Active. Tamsulosin HCl (0.4MG Capsule, Oral) Active. Telmisartan-HCTZ (40-12.5MG Tablet, Oral) Active. Valtrex (500MG Tablet, Oral) Active. Alka-Seltzer (Oral) Active. Medications Reconciled    Vitals (Armen Ferguson CMA; 03/23/2018 4:32 PM) 03/23/2018 4:31 PM Weight: 196.5 lb Height: 59in Body Surface Area: 1.83  m Body Mass Index: 39.69 kg/m  Temp.: 97.68F  Pulse: 112 (Regular)  P.OX: 98% (Room air) BP: 110/88 (Sitting, Left Arm, Standard)      Physical Exam Helen Hector MD; 03/23/2018 5:07 PM)  General Mental  Status-Alert. General Appearance-Not in acute distress, Not Sickly. Orientation-Oriented X3. Hydration-Well hydrated. Voice-Normal. Note: Mildly anxious and embarrassed but reassured. Not sickly. Not toxic.  Integumentary Global Assessment Upon inspection and palpation of skin surfaces of the - Axillae: non-tender, no inflammation or ulceration, no drainage. and Distribution of scalp and body hair is normal. General Characteristics Temperature - normal warmth is noted.  Head and Neck Head-normocephalic, atraumatic with no lesions or palpable masses. Face Global Assessment - atraumatic, no absence of expression. Neck Global Assessment - no abnormal movements, no bruit auscultated on the right, no bruit auscultated on the left, no decreased range of motion, non-tender. Trachea-midline. Thyroid Gland Characteristics - non-tender.  Eye Eyeball - Left-Extraocular movements intact, No Nystagmus. Eyeball - Right-Extraocular movements intact, No Nystagmus. Cornea - Left-No Hazy. Cornea - Right-No Hazy. Sclera/Conjunctiva - Left-No scleral icterus, No Discharge. Sclera/Conjunctiva - Right-No scleral icterus, No Discharge. Pupil - Left-Direct reaction to light normal. Pupil - Right-Direct reaction to light normal.  ENMT Ears Pinna - Left - no drainage observed, no generalized tenderness observed. Right - no drainage observed, no generalized tenderness observed. Nose and Sinuses External Inspection of the Nose - no destructive lesion observed. Inspection of the nares - Left - quiet respiration. Right - quiet respiration. Mouth and Throat Lips - Upper Lip - no fissures observed, no pallor noted. Lower Lip - no fissures observed, no  pallor noted. Nasopharynx - no discharge present. Oral Cavity/Oropharynx - Tongue - no dryness observed. Oral Mucosa - no cyanosis observed. Hypopharynx - no evidence of airway distress observed.  Chest and Lung Exam Inspection Movements - Normal and Symmetrical. Accessory muscles - No use of accessory muscles in breathing. Palpation Palpation of the chest reveals - Non-tender. Auscultation Breath sounds - Normal and Clear.  Cardiovascular Auscultation Rhythm - Regular. Murmurs & Other Heart Sounds - Auscultation of the heart reveals - No Murmurs and No Systolic Clicks.  Abdomen Inspection Inspection of the abdomen reveals - No Visible peristalsis and No Abnormal pulsations. Umbilicus - No Bleeding, No Urine drainage. Palpation/Percussion Palpation and Percussion of the abdomen reveal - Soft, Non Tender, No Rebound tenderness, No Rigidity (guarding) and No Cutaneous hyperesthesia. Note: Abdomen obese but soft. Not severely distended. No distasis recti. No umbilical or other anterior abdominal wall hernias  Female Genitourinary Sexual Maturity Tanner 5 - Adult hair pattern. Note: No vaginal bleeding nor discharge  Rectal Note: Please refer to anoscopy. External hemorrhoids left lateral > R posterior. Grade 2 internal hems  Peripheral Vascular Upper Extremity Inspection - Left - No Cyanotic nailbeds, Not Ischemic. Right - No Cyanotic nailbeds, Not Ischemic.  Neurologic Neurologic evaluation reveals -normal attention span and ability to concentrate, able to name objects and repeat phrases. Appropriate fund of knowledge , normal sensation and normal coordination. Mental Status Affect - not angry, not paranoid. Cranial Nerves-Normal Bilaterally. Gait-Normal.  Neuropsychiatric Mental status exam performed with findings of-able to articulate well with normal speech/language, rate, volume and coherence, thought content normal with ability to perform basic computations  and apply abstract reasoning and no evidence of hallucinations, delusions, obsessions or homicidal/suicidal ideation.  Musculoskeletal Global Assessment Spine, Ribs and Pelvis - no instability, subluxation or laxity. Right Upper Extremity - no instability, subluxation or laxity.  Lymphatic Head & Neck  General Head & Neck Lymphatics: Bilateral - Description - No Localized lymphadenopathy. Axillary  General Axillary Region: Bilateral - Description - No Localized lymphadenopathy. Femoral & Inguinal  Generalized Femoral & Inguinal Lymphatics: Left - Description - No Localized lymphadenopathy.  Right - Description - No Localized lymphadenopathy.    Assessment & Plan Helen Hector MD; 03/23/2018 5:02 PM)  EXTERNAL HEMORRHOIDS WITH COMPLICATION (U44.0) Impression: Pleasant woman frustrated with hygiene itching and discomfort from external hemorrhoids despite good bowel regimen and good hygiene regimen.  I think she would benefit from hemorrhoidal ligation and pexy. I wonder if perhaps she is having some internal prolapse as well. External hemorrhoidectomy. I did caution that she wanted pain and discomfort especially at the first couple weeks, then things improve in the anatomy is better after few months. She is interested in proceeding.  The anatomy & physiology of the anorectal region was discussed. The pathophysiology of hemorrhoids and differential diagnosis was discussed. Natural history progression was discussed. I stressed the importance of a bowel regimen to have daily soft bowel movements to minimize progression of disease. Goal of one BM / day ideal. Use of wet wipes, warm baths, avoiding straining, etc were emphasized.  Educational handouts further explaining the pathology, treatment options, and bowel regimen were given as well. The patient expressed understanding.  Current Plans ANOSCOPY, DIAGNOSTIC (34742) The anatomy & physiology of the anorectal region was discussed. The  pathophysiology of hemorrhoids and differential diagnosis was discussed. Natural history risks without surgery was discussed. I stressed the importance of a bowel regimen to have daily soft bowel movements to minimize progression of disease. Interventions such as sclerotherapy & banding were discussed.  The patient's symptoms are not adequately controlled by medicines and other non-operative treatments. I feel the risks & problems of no surgery outweigh the operative risks; therefore, I recommended surgery to treat the hemorrhoids by ligation, pexy, and possible resection.  Risks such as bleeding, infection, urinary difficulties, need for further treatment, heart attack, death, and other risks were discussed. I noted a good likelihood this will help address the problem. Goals of post-operative recovery were discussed as well. Possibility that this will not correct all symptoms was explained. Post-operative pain, bleeding, constipation, and other problems after surgery were discussed. We will work to minimize complications. Educational handouts further explaining the pathology, treatment options, and bowel regimen were given as well. Questions were answered. The patient expresses understanding & wishes to proceed with surgery.   PROLAPSED INTERNAL HEMORRHOIDS, GRADE 2 (K64.1)  Current Plans Pt Education - [facility] Hemorrhoids (Ludy Messamore): discussed with patient and provided information.  ENCOUNTER FOR PREOPERATIVE EXAMINATION FOR GENERAL SURGICAL PROCEDURE (Z01.818)  Current Plans You are being scheduled for surgery- Our schedulers will call you.  You should hear from our office's scheduling department within 5 working days about the location, date, and time of surgery. We try to make accommodations for patient's preferences in scheduling surgery, but sometimes the OR schedule or the surgeon's schedule prevents [place] from making those accommodations.  If you have not heard from our office  [phone]) in 5 working days, call the office and ask for your surgeon's nurse.  If you have other questions about your diagnosis, plan, or surgery, call the office and ask for your surgeon's nurse.  Pt Education - [facility] Rectal Prep for Anorectal outpatient/office surgery: discussed with patient and provided information. Pt Education - [facility] Rectal Surgery HCI (Helen Greene): discussed with patient and provided information. Pt Education - [facility] Good Bowel Health (Helen Greene)  Helen Hector, MD, FACS, MASCRS Gastrointestinal and Minimally Invasive Surgery    [address] [place], [place] [address] [phone] Main / Paging [phone] Fax

## 2018-04-01 ENCOUNTER — Ambulatory Visit (INDEPENDENT_AMBULATORY_CARE_PROVIDER_SITE_OTHER): Payer: 59 | Admitting: Psychiatry

## 2018-04-01 DIAGNOSIS — F331 Major depressive disorder, recurrent, moderate: Secondary | ICD-10-CM | POA: Diagnosis not present
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
## 2018-04-08 ENCOUNTER — Ambulatory Visit (INDEPENDENT_AMBULATORY_CARE_PROVIDER_SITE_OTHER): Payer: 59 | Admitting: Psychiatry

## 2018-04-08 DIAGNOSIS — F331 Major depressive disorder, recurrent, moderate: Secondary | ICD-10-CM

## 2018-04-08 NOTE — Progress Notes (Signed)
      [facility] Counselor/Therapist Progress Note   Patient ID: Helen Greene, MRN: [id number]  Date: 04/08/2018  Timespent: 60 minutes  Treatment Type: Individual  Subjective:  Patient struggling with issues with her 48 yr old son who lives with his dad, about 10 minutes away from mom's home.  Discussed ways she might communicate with son and be more honest about her feelings and wishes.  Also having difficult time at work with co-workers to the point it is distracting her and affecting her mood a lot.  Focused on some strategies to help her discern what is in her control and what is not.  Also looked at ways she can change how she communicates with others in order to be better heard and understood.  Limit-setting reviewed. Reminded of her strengths and how to use them to get better results when needing to interact with difficult people, or needing to tune out annoying distractions.  Very hard for patient to view things any differently, which patient acknowledges as well as it being difficult to truly "let go" of things in order to move forward.  Encouraged to try some of the strategies discussed today in order to see some difference in her stress, anxiety, and depressed.  Interventions:CBT, Solution Focused, Strength-based and Supportive  Mental Status Exam:   Appearance:   Casual     Behavior:  Appropriate and Sharing  Motor:  Normal  Speech/Language:   Normal Rate  Affect:  Congruent  Mood:  anxious and depressed  Thought process:  Coherent  Thought content:    Logical  Perceptual disturbances:    Normal  Orientation:  Full (Time, Place, and Person)  Attention:  Good  Concentration:  good  Memory:  Immediate  Fund of knowledge:   Good  Insight:    Good  Judgment:   Good  Impulse Control:  good    Reported Symptoms: anxiety, some depressed mood, frustrated, hurt by son's lack of response to her  Risk Assessment: Danger to Self:  No Self-injurious Behavior: No Danger to  Others: No Duty to Warn:no Physical Aggression / Violence:No  Access to Firearms a concern: No  Gang Involvement:No   Diagnosis:   ICD-10-CM   1. Major depressive disorder, recurrent episode, moderate (HCC) F33.1      Plan:  Will see patient in 1-2 weeks to continue goal-directed treatment.  Mathis Fare, LCSW

## 2018-04-14 ENCOUNTER — Ambulatory Visit (INDEPENDENT_AMBULATORY_CARE_PROVIDER_SITE_OTHER): Payer: 59 | Admitting: Psychiatry

## 2018-04-14 ENCOUNTER — Encounter: Payer: Self-pay | Admitting: Psychiatry

## 2018-04-14 ENCOUNTER — Ambulatory Visit: Payer: Self-pay | Admitting: Psychiatry

## 2018-04-14 DIAGNOSIS — F331 Major depressive disorder, recurrent, moderate: Secondary | ICD-10-CM | POA: Diagnosis not present

## 2018-04-14 NOTE — Progress Notes (Signed)
      [facility] Counselor/Therapist Progress Note   Patient ID: Helen Greene, MRN: [id number]  Date: 04/14/2018  Timespent: 60 minutes   Treatment Type: Individual   Reported Symptoms:  Anxiety, tearful, stressed she says primarily at work,    Mental Status Exam:    Appearance:   Casual     Behavior:  Appropriate and Sharing  Motor:  Normal  Speech/Language:   Normal Rate  Affect:  Tearful  Mood:  anxious and depressed  Thought process:  normal  Thought content:    WNL  Sensory/Perceptual disturbances:    WNL  Orientation:  oriented to person, place, time/date, situation, day of week, month of year and year  Attention:  Good  Concentration:  Good  Memory:  WNL  Fund of knowledge:   Good  Insight:    Fair  Judgment:   Good  Impulse Control:  Fair     Risk Assessment: Danger to Self:  No Self-injurious Behavior: No Danger to Others: No Duty to Warn:no Physical Aggression / Violence:No  Access to Firearms a concern: No  Gang Involvement:No    Subjective:  Patient rescheduled her appt to come in earlier.  Felt more anxious and tearful most recently, with past 2 days being worse.  Was not able to go into work today.  Tearful loudly and calmed down in a couple minutes.  Admits to drinking "2 orange juice glasses" of beer daily after work the past couple weeks.  Later added that "its not usually this much".  States she occasionally "smokes marijuana by taking a puff" and did that once yesterday.  Feeling anxious, can't relax, feeling like something is restraining me. Reports she is sleeping good at night and doesn't think she is waking up any.  Reminds me that she has restless legs syndrome but not sure if she thinks it's related or not.  Then says "this is my crazy moment" jokingly.  Feeling more stressed at work and even though she says some things are better there, she is still suspicious that others in her unit are listening to her when she's on phone calls. Patient  became more calm and actually appropriately laughing some.  I observed her left leg jerk twice within 15 minutes and she then said I'm wondering if the "feeling of being restrained is part of my restless leg syndrome, and she plans to discuss this T. Claybon Jabs, PA-C tomorrow morning at her appt.  Adds that her restless leg syndrome is worse when her stress/anxiety is elevated.  At end of session, patient is appearing very calm, talking normal rate, not tearful, and able to focus forward.  Will be seeing T.Hurst, PA-C tomorrow morning for follow up.     Interventions: Solution-Oriented/Positive Psychology, Ego-Supportive and Insight-Oriented   Diagnosis:   ICD-10-CM   1. Major depressive disorder, recurrent episode, moderate (HCC) F33.1      Plan:   To see patient next week to continue goal-oriented treatment.   Mathis Fare, LCSW

## 2018-04-15 ENCOUNTER — Encounter: Payer: Self-pay | Admitting: Physician Assistant

## 2018-04-15 ENCOUNTER — Ambulatory Visit (INDEPENDENT_AMBULATORY_CARE_PROVIDER_SITE_OTHER): Payer: 59 | Admitting: Physician Assistant

## 2018-04-15 DIAGNOSIS — F331 Major depressive disorder, recurrent, moderate: Secondary | ICD-10-CM | POA: Diagnosis not present

## 2018-04-15 DIAGNOSIS — Z0289 Encounter for other administrative examinations: Secondary | ICD-10-CM

## 2018-04-15 DIAGNOSIS — F411 Generalized anxiety disorder: Secondary | ICD-10-CM

## 2018-04-15 DIAGNOSIS — G2581 Restless legs syndrome: Secondary | ICD-10-CM

## 2018-04-15 MED ORDER — PRAMIPEXOLE DIHYDROCHLORIDE ER 2.25 MG PO TB24: 2.2500 mg | ORAL_TABLET | Freq: Every day | ORAL | 1 refills | Status: DC

## 2018-04-15 MED ORDER — BUPROPION HCL ER (XL) 150 MG PO TB24: ORAL_TABLET | ORAL | 1 refills | Status: DC

## 2018-04-15 NOTE — Progress Notes (Signed)
[facility] Med Check  Patient ID: Helen Greene,  MRN: [id number]  PCP: Doristine Bosworth, MD  Date of Evaluation: 04/15/2018 Time spent:25 minutes  Chief Complaint:  Chief Complaint    Follow-up; Anxiety; Depression      HISTORY/CURRENT STATUS: HPI Here for 4 week med check.  Biggest prob is her legs feel like she wants to jump out of them. They tingle off and all day.  Sometimes they jerk. Feels like she needs to keep moving.  Also when she wears jeans, her legs feel restricted, kind of like a claustophobia in her legs. Doesn't get worse at any specific time of day.  She sleeps well.  She constantly has to rub her legs at work because she has to sit all the time.  Once she gets home and can put on jogging pants, and then walk around and do things,she feels better.   When going through her med list, we discovered that she hasn't been on the Wellbutrin XL.  She's unsure why, but may have forgotten to pick it up at the pharmacy.  She has felt really bad, with dizziness, no motivation, more sad, wants to isolate, more PA and not wanting to go to work.  She had to be out yesterday because she was panicky and really depressed.  She does not feel like getting out of bed sometimes.  Past medications for mental health diagnoses include: Wellbutrin, Risperdal, Lexapro, pramipexole, Vistaril, Abilify, questionably Xanax  Individual Medical History/ Review of Systems: Changes? :Yes   Allergies: Bee venom; Enablex [darifenacin hydrobromide]; Erythromycin; Macrobid [nitrofurantoin monohyd macro]; Metronidazole; Nitrofuran derivatives; and Risperidone and related  Current Medications:  Current Outpatient Medications:  .  ARIPiprazole (ABILIFY) 2 MG tablet, Take 2 mg by mouth daily., Disp: , Rfl:  .  escitalopram (LEXAPRO) 20 MG tablet, Take 20 mg by mouth daily., Disp: , Rfl:  .  folic acid (FOLVITE) 1 MG tablet, Take 1 tablet (1 mg total) by mouth daily. Patient needs office visit for more  refills, Disp: 30 tablet, Rfl: 0 .  loratadine (CLARITIN) 10 MG tablet, Take 1 tablet (10 mg total) by mouth daily., Disp: 30 tablet, Rfl: 5 .  mirabegron ER (MYRBETRIQ) 50 MG TB24 tablet, Take 50 mg by mouth daily., Disp: , Rfl:  .  omeprazole (PRILOSEC) 20 MG capsule, Take 1 capsule (20 mg total) by mouth daily., Disp: 30 capsule, Rfl: 5 .  ondansetron (ZOFRAN) 4 MG tablet, Take 1 tablet (4 mg total) by mouth every 8 (eight) hours as needed for nausea or vomiting., Disp: 20 tablet, Rfl: 0 .  tamsulosin (FLOMAX) 0.4 MG CAPS capsule, Take 0.4 mg by mouth., Disp: , Rfl:  .  telmisartan-hydrochlorothiazide (MICARDIS HCT) 40-12.5 MG tablet, Take 1 tablet by mouth daily., Disp: 30 tablet, Rfl: 11 .  Aspirin Effervescent (ALKA-SELTZER ORIGINAL PO), Take by mouth as needed., Disp: , Rfl:  .  azelastine (ASTELIN) 0.1 % nasal spray, Place 2 sprays into both nostrils 3 (three) times daily., Disp: 30 mL, Rfl: 5 .  buPROPion (WELLBUTRIN XL) 150 MG 24 hr tablet, 1 po q  Am x 1 wk, then increase to 2 q am. (Patient not taking: Reported on 04/15/2018), Disp: 60 tablet, Rfl: 1 .  clobetasol cream (TEMOVATE) 0.05 %, Apply 1 application topically 2 (two) times daily. Thin application to left vulva. (Patient not taking: Reported on 04/15/2018), Disp: 30 g, Rfl: 0 .  cyclobenzaprine (FLEXERIL) 5 MG tablet, Take 1 tablet (5 mg total) by mouth 3 (  three) times daily as needed for muscle spasms. (Patient not taking: Reported on 04/15/2018), Disp: 90 tablet, Rfl: 6 .  fluticasone (FLONASE) 50 MCG/ACT nasal spray, Place 2 sprays into both nostrils daily., Disp: 16 g, Rfl: 5 .  hydrOXYzine (VISTARIL) 25 MG capsule, Take 75 mg by mouth at bedtime. , Disp: , Rfl: 2 .  nystatin (MYCOSTATIN/NYSTOP) powder, Apply topically 2 (two) times daily. (Patient not taking: Reported on 04/15/2018), Disp: 15 g, Rfl: 1 .  phentermine 37.5 MG capsule, Take 1 capsule (37.5 mg total) by mouth every morning. (Patient not taking: Reported on  04/15/2018), Disp: 30 capsule, Rfl: 2 .  Pramipexole Dihydrochloride 2.25 MG TB24, Take 2.25 mg by mouth daily at 8 pm., Disp: 30 tablet, Rfl: 1 .  promethazine (PHENERGAN) 25 MG suppository, Place 1 suppository (25 mg total) rectally every 6 (six) hours as needed for nausea or vomiting. (Patient not taking: Reported on 04/15/2018), Disp: 12 suppository, Rfl: 1 .  valACYclovir (VALTREX) 1000 MG tablet, TAKE 2 TABLETS BY MOUTH EVERY 12 HOURS AS NEEDED FOR ONE DAY WITH OUTBREAK (Patient not taking: Reported on 04/15/2018), Disp: 20 tablet, Rfl: 0 Medication Side Effects: none  Family Medical/ Social History: Changes? No  MENTAL HEALTH EXAM:  Last menstrual period 09/07/2015.There is no height or weight on file to calculate BMI.  General Appearance: Casual obese  Eye Contact:  Good  Speech:  Clear and Coherent  Volume:  Normal  Mood:  Depressed  Affect:  Tearful consolable  Thought Process:  Goal Directed  Orientation:  Full (Time, Place, and Person)  Thought Content: Logical   Suicidal Thoughts:  No  Homicidal Thoughts:  No  Memory:  WNL  Judgement:  Fair  Insight:  Fair  Psychomotor Activity:  Normal  Concentration:  Concentration: Good  Recall:  Good  Fund of Knowledge: Good  Language: Good  Assets:  Desire for Improvement  ADL's:  Intact  Cognition: WNL  Prognosis:  Good    DIAGNOSES:    ICD-10-CM   1. Major depressive disorder, recurrent episode, moderate (HCC) F33.1   2. Generalized anxiety disorder F41.1   3. Restless leg syndrome G25.81     Receiving Psychotherapy: Yes Weekly with Rockne Menghini, LCSW   RECOMMENDATIONS: Spent 25 minutes with her and at least 50% of that time was in counseling, discussing her condition and treatment options. I think she needs to be out of work for the rest of this week, going back Monday 04/20/18 and excuse to be out from 04/14/18 until November 4.  I recommend we fill out FMLA papers.  For now, she may need to be out 2 episodes a month  with up to 2 days each time, since she is coming in for therapy on a weekly basis, she may need to be out for 2 hours a week for those appointments. Will restart Wellbutrin XL at 150 mg.  Plan for her to increase to 2 every morning in 2 weeks. We are increasing pramipexole and changing from the short-acting to long-acting. Continue all other medications. Return in 2 to 4 weeks.  Melony Overly, PA-C

## 2018-04-21 ENCOUNTER — Ambulatory Visit (INDEPENDENT_AMBULATORY_CARE_PROVIDER_SITE_OTHER): Payer: 59 | Admitting: Psychiatry

## 2018-04-21 DIAGNOSIS — F331 Major depressive disorder, recurrent, moderate: Secondary | ICD-10-CM

## 2018-04-21 NOTE — Progress Notes (Signed)
      [facility] Counselor/Therapist Progress Note   Patient ID: Helen Greene, MRN: [id number]  Date: 04/21/2018  Timespent: 55 minutes   Treatment Type: Individual   Reported Symptoms: depression, anxiety, fatigue   Mental Status Exam:    Appearance:   Casual     Behavior:  Appropriate and Sharing  Motor:  Normal  Speech/Language:   Normal Rate  Affect:  Depressed  Mood:  anxious and depressed  Thought process:  normal  Thought content:    WNL  Sensory/Perceptual disturbances:    WNL  Orientation:  oriented to person, place, time/date, situation, day of week, month of year and year  Attention:  Good  Concentration:  Fair  Memory:  Patient reports short term memory issues and that some days are better than others  Fund of knowledge:   Good  Insight:    improved since I've been on my medicine (Wellbutrin)  Judgment:   Good  Impulse Control:  Fair     Risk Assessment: Danger to Self:  No Self-injurious Behavior: No Danger to Others: No Duty to Warn:no Physical Aggression / Violence:No  Access to Firearms a concern: No  Gang Involvement:No    Subjective: Patient in today feeling some depression and anxiety.  Anxious about multiple issues including; returning to work today after being out a few days; family (especially youngest son living with dad; and relationship with daughter-in-law);  And relationship issues at work.  Struggles within herself and whether or not she feels she fits in with other people.  Easy to assume she knows what other people are thinking and her assumptions are typically negative.  Discussed why she does not have closure with certain people in family and what she wants to happen in terms of those relationships.  Due to time constraints, we spent last few minutes of session helping her to feel more grounded, especially since she is returning to work today.  Calmer upon leaving.   Interventions: Solution-Oriented/Positive Psychology,  Ego-Supportive and Insight-Oriented   Diagnosis:   ICD-10-CM   1. Major depressive disorder, recurrent episode, moderate (HCC) F33.1      Plan:  To see patient next week to continue goal-directed treatment.   Mathis Fare, LCSW

## 2018-04-28 ENCOUNTER — Ambulatory Visit (INDEPENDENT_AMBULATORY_CARE_PROVIDER_SITE_OTHER): Payer: 59 | Admitting: Psychiatry

## 2018-04-28 ENCOUNTER — Ambulatory Visit (INDEPENDENT_AMBULATORY_CARE_PROVIDER_SITE_OTHER): Payer: 59 | Admitting: Physician Assistant

## 2018-04-28 ENCOUNTER — Encounter: Payer: Self-pay | Admitting: Physician Assistant

## 2018-04-28 DIAGNOSIS — F411 Generalized anxiety disorder: Secondary | ICD-10-CM | POA: Diagnosis not present

## 2018-04-28 DIAGNOSIS — G2581 Restless legs syndrome: Secondary | ICD-10-CM | POA: Diagnosis not present

## 2018-04-28 DIAGNOSIS — F331 Major depressive disorder, recurrent, moderate: Secondary | ICD-10-CM
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
## 2018-04-28 NOTE — Progress Notes (Signed)
[facility] Med Check  Patient ID: Helen Greene,  MRN: [id number]  PCP: Doristine Bosworth, MD  Date of Evaluation: 04/28/2018 Time spent:15 minutes  Chief Complaint:  Chief Complaint    Follow-up      HISTORY/CURRENT STATUS: HPI  Patient presents for a 2-week med check.  At the last visit, she reported decreased motivation, increased sadness, wanting to isolate more, and having panic attacks.  We discovered that she was off of the Wellbutrin by mistake.  That was restarted and she states she feels much better already.  Still has stress at work.  She complains of forgetfulness.  States is been going on for a while and she just has not mentioned it.  She forgets more of current events, like what she did yesterday or last week rather than things further back.  Individual Medical History/ Review of Systems: Changes? :No   Allergies: Bee venom; Enablex [darifenacin hydrobromide]; Erythromycin; Macrobid [nitrofurantoin monohyd macro]; Metronidazole; Nitrofuran derivatives; and Risperidone and related  Current Medications:  Current Outpatient Medications:  .  ARIPiprazole (ABILIFY) 2 MG tablet, Take 2 mg by mouth daily., Disp: , Rfl:  .  buPROPion (WELLBUTRIN XL) 150 MG 24 hr tablet, 1 po q  Am x 1 wk, then increase to 2 q am., Disp: 60 tablet, Rfl: 1 .  escitalopram (LEXAPRO) 20 MG tablet, Take 20 mg by mouth daily., Disp: , Rfl:  .  hydrOXYzine (VISTARIL) 25 MG capsule, Take 75 mg by mouth at bedtime. , Disp: , Rfl: 2 .  Pramipexole Dihydrochloride 2.25 MG TB24, Take 2.25 mg by mouth daily at 8 pm., Disp: 30 tablet, Rfl: 1 .  Aspirin Effervescent (ALKA-SELTZER ORIGINAL PO), Take by mouth as needed., Disp: , Rfl:  .  azelastine (ASTELIN) 0.1 % nasal spray, Place 2 sprays into both nostrils 3 (three) times daily., Disp: 30 mL, Rfl: 5 .  clobetasol cream (TEMOVATE) 0.05 %, Apply 1 application topically 2 (two) times daily. Thin application to left vulva. (Patient not taking: Reported on  04/15/2018), Disp: 30 g, Rfl: 0 .  cyclobenzaprine (FLEXERIL) 5 MG tablet, Take 1 tablet (5 mg total) by mouth 3 (three) times daily as needed for muscle spasms. (Patient not taking: Reported on 04/15/2018), Disp: 90 tablet, Rfl: 6 .  fluticasone (FLONASE) 50 MCG/ACT nasal spray, Place 2 sprays into both nostrils daily., Disp: 16 g, Rfl: 5 .  folic acid (FOLVITE) 1 MG tablet, Take 1 tablet (1 mg total) by mouth daily. Patient needs office visit for more refills, Disp: 30 tablet, Rfl: 0 .  loratadine (CLARITIN) 10 MG tablet, Take 1 tablet (10 mg total) by mouth daily., Disp: 30 tablet, Rfl: 5 .  mirabegron ER (MYRBETRIQ) 50 MG TB24 tablet, Take 50 mg by mouth daily., Disp: , Rfl:  .  nystatin (MYCOSTATIN/NYSTOP) powder, Apply topically 2 (two) times daily. (Patient not taking: Reported on 04/15/2018), Disp: 15 g, Rfl: 1 .  omeprazole (PRILOSEC) 20 MG capsule, Take 1 capsule (20 mg total) by mouth daily., Disp: 30 capsule, Rfl: 5 .  ondansetron (ZOFRAN) 4 MG tablet, Take 1 tablet (4 mg total) by mouth every 8 (eight) hours as needed for nausea or vomiting., Disp: 20 tablet, Rfl: 0 .  phentermine 37.5 MG capsule, Take 1 capsule (37.5 mg total) by mouth every morning. (Patient not taking: Reported on 04/15/2018), Disp: 30 capsule, Rfl: 2 .  promethazine (PHENERGAN) 25 MG suppository, Place 1 suppository (25 mg total) rectally every 6 (six) hours as needed for nausea or  vomiting. (Patient not taking: Reported on 04/15/2018), Disp: 12 suppository, Rfl: 1 .  tamsulosin (FLOMAX) 0.4 MG CAPS capsule, Take 0.4 mg by mouth., Disp: , Rfl:  .  telmisartan-hydrochlorothiazide (MICARDIS HCT) 40-12.5 MG tablet, Take 1 tablet by mouth daily., Disp: 30 tablet, Rfl: 11 .  valACYclovir (VALTREX) 1000 MG tablet, TAKE 2 TABLETS BY MOUTH EVERY 12 HOURS AS NEEDED FOR ONE DAY WITH OUTBREAK (Patient not taking: Reported on 04/15/2018), Disp: 20 tablet, Rfl: 0 Medication Side Effects: none  Family Medical/ Social History:  Changes? No  MENTAL HEALTH EXAM:  Last menstrual period 09/07/2015.There is no height or weight on file to calculate BMI.  General Appearance: Casual  Eye Contact:  Good  Speech:  Clear and Coherent  Volume:  Normal  Mood:  Euthymic  Affect:  Appropriate  Thought Process:  Goal Directed  Orientation:  Full (Time, Place, and Person)  Thought Content: Logical   Suicidal Thoughts:  No  Homicidal Thoughts:  No  Memory:  WNL  Judgement:  Good  Insight:  Good  Psychomotor Activity:  Normal  Concentration:  Concentration: Good  Recall:  Fair states she is forgetting things lately.  An example like what she did the previous day.  Fund of Knowledge: Good  Language: Good  Assets:  Desire for Improvement  ADL's:  Intact  Cognition: WNL  Prognosis:  Good    DIAGNOSES:    ICD-10-CM   1. Major depressive disorder, recurrent episode, moderate (HCC) F33.1   2. Generalized anxiety disorder F41.1   3. Restless leg syndrome G25.81     Receiving Psychotherapy: Yes With Rockne Menghini, LCSW   RECOMMENDATIONS: Glad to see she is doing better. I think the forgetfulness is more related to the anxiety and depression, and I hope that when it is better treated, the memory will improve.  She and I both will continue to watch and evaluate further as needed. Continue all current medications. Continue psychotherapy with Rockne Menghini, LCSW Return 4 weeks.   Melony Overly, PA-C

## 2018-05-05 ENCOUNTER — Ambulatory Visit (INDEPENDENT_AMBULATORY_CARE_PROVIDER_SITE_OTHER): Payer: 59 | Admitting: Psychiatry

## 2018-05-05 DIAGNOSIS — F331 Major depressive disorder, recurrent, moderate: Secondary | ICD-10-CM

## 2018-05-05 NOTE — Progress Notes (Signed)
      [facility] Counselor/Therapist Progress Note   Patient ID: Helen Greene, MRN: [id number]  Date: 05/05/2018  Timespent: 60 minutes   Treatment Type: Individual   Reported Symptoms: anxiety, depression, stress   Mental Status Exam:    Appearance:   Casual     Behavior:  Appropriate and Sharing  Motor:  Normal  Speech/Language:   Normal Rate  Affect:  Tearful  Mood:  anxious and depressed  Thought process:  normal  Thought content:    WNL  Sensory/Perceptual disturbances:    WNL  Orientation:  oriented to person, place, time/date, situation, day of week, month of year and year  Attention:  Good  Concentration:  Good  Memory:  WNL  Fund of knowledge:   Good  Insight:    Fair  Judgment:   Good  Impulse Control:  Good     Risk Assessment: Danger to Self:  No Self-injurious Behavior: No Danger to Others: No Duty to Warn:no Physical Aggression / Violence:No  Access to Firearms a concern: No  Gang Involvement:No    Subjective:   Patient in today with feelings on anxiety and depression, with some tearfulness.  Says it's all stress related.  Work and family stressors. Hurt in dealings with her 517 yr old son who lives with his father.  Looked at the negative thoughts she often has before onset of increased anxiety, as she had a sudden increase upon arriving this morning for appt.  Also observed patient being able to calm self with some redirection from therapist, and encouraged her be able to use the same calming techniques when needed.  Processed her tendency to make inaccurate assumptions and how to let go of that, especially at work.    Interventions: Solution-Oriented/Positive Psychology and Ego-Supportive   Diagnosis:   ICD-10-CM   1. Major depressive disorder, recurrent episode, moderate (HCC) F33.1      Plan: Patient to follow through on practicing anxiety reduction strategies taught in session today.  Also to contact her younger son to reschedule their  meeting re: personal issue.     Mathis Fareeborah Manju Kulkarni, LCSW

## 2018-05-11 ENCOUNTER — Ambulatory Visit: Payer: 59 | Admitting: Psychiatry

## 2018-05-12 ENCOUNTER — Ambulatory Visit: Payer: 59 | Admitting: Psychiatry

## 2018-05-19 ENCOUNTER — Ambulatory Visit (INDEPENDENT_AMBULATORY_CARE_PROVIDER_SITE_OTHER): Payer: 59 | Admitting: Psychiatry

## 2018-05-19 DIAGNOSIS — F331 Major depressive disorder, recurrent, moderate: Secondary | ICD-10-CM | POA: Diagnosis not present

## 2018-05-19 NOTE — Progress Notes (Signed)
      [facility] Counselor/Therapist Progress Note  Patient ID: Helen Greene, MRN: [id number],    Date: 05/19/2018  Time Spent: 58 minutes  Treatment Type: Individual Therapy  Reported Symptoms: Depressed mood and Anxious Mood  Mental Status Exam:  Appearance:   Casual     Behavior:  Appropriate and Sharing  Motor:  Normal  Speech/Language:   Normal Rate  Affect:  Congruent  Mood:  anxious and depressed  Thought process:  normal  Thought content:    WNL  Sensory/Perceptual disturbances:    WNL  Orientation:  oriented to person, place, time/date, situation, day of week, month of year and year  Attention:  Good  Concentration:  Good  Memory:  Patient reports "forgetfulness" in recent memory; worse if she misses her meds.  Fund of knowledge:   Good  Insight:    Good  Judgment:   Good  Impulse Control:  Good   Risk Assessment: Danger to Self:  No Self-injurious Behavior: No Danger to Others: No Duty to Warn:no Physical Aggression / Violence:No  Access to Firearms a concern: No  Gang Involvement:No   Subjective: Patient in today with symptoms of depression and some anxiety, however is noticeably better today.  Patient adds some days are better than others, and states she's working on improving her outlook to be more positive and proactive vs reactive.  Work improved some.  Stressed and some depression re: upcoming Christmas holidays, which she talked through today and admits she acknowledges that she needs to keep practicing the shift in her mindset from negative to trying to look for what might go right vs wrong.  Self-care urged.  To return 1-2 wks.  Interventions: Solution-Oriented/Positive Psychology and Ego-Supportive  Diagnosis:   ICD-10-CM   1. Major depressive disorder, recurrent episode, moderate (HCC) F33.1     Plan:  Patient to continue working on changes mentioned above, realizing she does have some control as to how she feels emotionally.  Will see her again  in 1-2 weeks.  Mathis Fareeborah Chesney Klimaszewski, LCSW

## 2018-05-21 ENCOUNTER — Ambulatory Visit: Admit: 2018-05-21 | Payer: 59 | Admitting: Surgery

## 2018-05-21 SURGERY — HEMORRHOIDECTOMY
Anesthesia: General

## 2018-05-25 ENCOUNTER — Ambulatory Visit (INDEPENDENT_AMBULATORY_CARE_PROVIDER_SITE_OTHER): Payer: 59 | Admitting: Psychiatry

## 2018-05-25 DIAGNOSIS — F331 Major depressive disorder, recurrent, moderate: Secondary | ICD-10-CM | POA: Diagnosis not present

## 2018-05-25 NOTE — Progress Notes (Signed)
      [facility] Counselor/Therapist Progress Note  Patient ID: Helen Greene, MRN: [id number],    Date: 05/25/2018  Time Spent:   55 minutes  Treatment Type: Individual Therapy  Reported Symptoms: Depressed mood and Anxious Mood  Mental Status Exam:  Appearance:   Casual     Behavior:  Appropriate and Sharing  Motor:  Normal  Speech/Language:   Normal Rate  Affect:  Congruent  Mood:  anxious and depressed  Thought process:  normal  Thought content:    WNL  Sensory/Perceptual disturbances:    WNL  Orientation:  oriented to person, place, time/date, situation, day of week, month of year and year  Attention:  Good  Concentration:  Good  Memory:  Immediate;   Fair Recent;   Fair  [employer] Energy[employer] of knowledge:   Good  Insight:    Fair  Judgment:   Good  Impulse Control:  Good   Risk Assessment: Danger to Self:  No Self-injurious Behavior: No Danger to Others: No Duty to Warn:no Physical Aggression / Violence:No  Access to Firearms a concern: No  Gang Involvement:No   Subjective:   Had visit with 48 yr old son past week (who lives with his father).  Sad that she doesn't get to see him often.  Feels son doesn't want to see her often.  Also babysat her 242 1/2 yr old grandson for several hours yesterday and enjoyed that although tiring.  Overall, mood is more positive today, although some sadness expressed also over family situations and her feeling of "not fitting in" at times.  States that holidays are normally difficult for her but feels she is doing some better so far this year.  Discussed being able to speak with her adult son 11(25 yr old) about their relationship.    Interventions: Cognitive Behavioral Therapy and Ego-Supportive  Diagnosis:   ICD-10-CM   1. Major depressive disorder, recurrent episode, moderate (HCC) F33.1     Plan:   Patient to work more consistently on being more patient and positive with herself.  Also work on being able to state her needs to other family  members when needed.  Less worrying lately--so following up on prior discussions of letting go of worrying habit where she focused only on what may go wrong---to being a person of concern which looks more for what may go right versus focusing only on the negative.     Mathis Fareeborah Kensly Bowmer, LCSW

## 2018-05-27 ENCOUNTER — Other Ambulatory Visit: Payer: Self-pay | Admitting: Physician Assistant

## 2018-06-01 ENCOUNTER — Encounter: Payer: Self-pay | Admitting: Emergency Medicine

## 2018-06-02 ENCOUNTER — Ambulatory Visit (INDEPENDENT_AMBULATORY_CARE_PROVIDER_SITE_OTHER): Payer: 59 | Admitting: Psychiatry

## 2018-06-02 DIAGNOSIS — F331 Major depressive disorder, recurrent, moderate: Secondary | ICD-10-CM | POA: Diagnosis not present

## 2018-06-02 NOTE — Progress Notes (Signed)
      [facility] Counselor/Therapist Progress Note  Patient ID: Helen Greene, MRN: [id number],    Date: 06/02/2018  Time Spent: 50 minutes  Treatment Type: Individual Therapy  Reported Symptoms: anxiety  Mental Status Exam:  Appearance:   Casual     Behavior:  Appropriate and Sharing  Motor:  Normal  Speech/Language:   Normal Rate  Affect:  Congruent  Mood:  anxious  Thought process:  normal  Thought content:    WNL  Sensory/Perceptual disturbances:    WNL  Orientation:  oriented to person, place, time/date, situation, day of week, month of year and year  Attention:  Good  Concentration:  Good  Memory:  Immediate;   Fair Recent;   Fair  [employer] of knowledge:   Good  Insight:    Fair  Judgment:   Good  Impulse Control:  Fair   Risk Assessment: Danger to Self:  No Self-injurious Behavior: No Danger to Others: No Duty to Warn:no Physical Aggression / Violence:No  Access to Firearms a concern: No  Gang Involvement:No   Subjective: Patient in today with anxiety, however less anxious today than previously.  Reports past few days have been some better---not as depressed, not as anxious and followed through on some suggestions from last session. Shared that she was able to implement some CBT techniques as she worked to change her thoughts to be more accurate and positive, stop "awfulizing", and looking for things to go right versus wrong.  Did speak with older son about Christmas plans and they worked out a plan to spend some time together.  Patient had thought earlier that would not be possible and had been reluctant to talk with son.  Also spent time with younger son, 48 yr old, this past week. To continue CBT strategies especially in working to change her thought patterns.  Interventions: Cognitive Behavioral Therapy and Ego-Supportive  Diagnosis:   ICD-10-CM   1. Major depressive disorder, recurrent episode, moderate (HCC) F33.1     Plan: Patient to continue with CBT  strategies and working towards improved mood, less anxiety, more self-awareness and self-acceptance.  Mathis Fareeborah Zebulen Simonis, LCSW

## 2018-06-16 ENCOUNTER — Ambulatory Visit (INDEPENDENT_AMBULATORY_CARE_PROVIDER_SITE_OTHER): Payer: 59 | Admitting: Physician Assistant

## 2018-06-16 ENCOUNTER — Encounter: Payer: Self-pay | Admitting: Physician Assistant

## 2018-06-16 DIAGNOSIS — F419 Anxiety disorder, unspecified: Secondary | ICD-10-CM

## 2018-06-16 DIAGNOSIS — F331 Major depressive disorder, recurrent, moderate: Secondary | ICD-10-CM

## 2018-06-16 DIAGNOSIS — G2581 Restless legs syndrome: Secondary | ICD-10-CM

## 2018-06-16 MED ORDER — PRAMIPEXOLE DIHYDROCHLORIDE ER 3 MG PO TB24: 3.0000 mg | ORAL_TABLET | Freq: Every day | ORAL | 1 refills | Status: DC

## 2018-06-16 NOTE — Progress Notes (Signed)
[facility] Med Check  Patient ID: Helen Greene,  MRN: [id number]  PCP: Doristine BosworthStallings, Zoe A, MD  Date of Evaluation: 06/16/2018 Time spent:15 minutes  Chief Complaint:  Chief Complaint    Follow-up      HISTORY/CURRENT STATUS: HPI patient is here for 6-week med check.  At the last visit we had increased the Mirapex because the RLS was worse.  She states it is better but she still has twitching and jumping of the legs worse in the evening.  States she is about 50% better.  For about 2 months however she is noticed that her head will twitch from time to time.  It is random worse in the evenings when she is trying to relax.  It may happen several times a day.  It does not usually happen at work.  Feels better as far as her mood.Patient denies loss of interest in usual activities and is able to enjoy things.  Denies decreased energy or motivation.  Appetite has not changed.  No extreme sadness, tearfulness, or feelings of hopelessness.  Denies any changes in concentration, making decisions or remembering things.  Denies suicidal or homicidal thoughts.  Anxiety is well controlled.  She is sleeping well.  Work is gotten a lot better.  Individual Medical History/ Review of Systems: Changes? :No    Past medications for mental health diagnoses include: Wellbutrin XL, Risperdal caused edema, Lexapro, Mirapex, Vistaril, questionably Xanax, Abilify, Lamictal, Klonopin  Allergies: Bee venom; Enablex [darifenacin hydrobromide]; Erythromycin; Macrobid [nitrofurantoin monohyd macro]; Metronidazole; Nitrofuran derivatives; and Risperidone and related  Current Medications:  Current Outpatient Medications:  .  ARIPiprazole (ABILIFY) 2 MG tablet, Take 2 mg by mouth daily., Disp: , Rfl:  .  azelastine (ASTELIN) 0.1 % nasal spray, Place 2 sprays into both nostrils 3 (three) times daily., Disp: 30 mL, Rfl: 5 .  buPROPion (WELLBUTRIN XL) 300 MG 24 hr tablet, Take 300 mg by mouth every morning., Disp: ,  Rfl:  .  cyclobenzaprine (FLEXERIL) 5 MG tablet, Take 1 tablet (5 mg total) by mouth 3 (three) times daily as needed for muscle spasms., Disp: 90 tablet, Rfl: 6 .  escitalopram (LEXAPRO) 20 MG tablet, TAKE 1 TABLET BY MOUTH EVERY DAY, Disp: 30 tablet, Rfl: 0 .  fluticasone (FLONASE) 50 MCG/ACT nasal spray, Place 2 sprays into both nostrils daily., Disp: 16 g, Rfl: 5 .  hydrOXYzine (ATARAX/VISTARIL) 25 MG tablet, Take 25 mg by mouth 3 (three) times daily., Disp: , Rfl:  .  loratadine (CLARITIN) 10 MG tablet, Take 1 tablet (10 mg total) by mouth daily., Disp: 30 tablet, Rfl: 5 .  LORazepam (ATIVAN) 0.5 MG tablet, Take 0.5 mg by mouth every 8 (eight) hours., Disp: , Rfl:  .  mirabegron ER (MYRBETRIQ) 50 MG TB24 tablet, Take 50 mg by mouth daily., Disp: , Rfl:  .  omeprazole (PRILOSEC) 20 MG capsule, Take 1 capsule (20 mg total) by mouth daily., Disp: 30 capsule, Rfl: 5 .  ondansetron (ZOFRAN) 4 MG tablet, Take 1 tablet (4 mg total) by mouth every 8 (eight) hours as needed for nausea or vomiting., Disp: 20 tablet, Rfl: 0 .  telmisartan-hydrochlorothiazide (MICARDIS HCT) 40-12.5 MG tablet, Take 1 tablet by mouth daily., Disp: 30 tablet, Rfl: 11 .  valACYclovir (VALTREX) 1000 MG tablet, TAKE 2 TABLETS BY MOUTH EVERY 12 HOURS AS NEEDED FOR ONE DAY WITH OUTBREAK, Disp: 20 tablet, Rfl: 0 .  Pramipexole Dihydrochloride (MIRAPEX ER) 3 MG TB24, Take 1 tablet (3 mg total) by mouth at  bedtime., Disp: 30 tablet, Rfl: 1 Medication Side Effects: none  Family Medical/ Social History: Changes? No  MENTAL HEALTH EXAM:  Last menstrual period 09/07/2015.There is no height or weight on file to calculate BMI.  General Appearance: Casual, Well Groomed and Obese  Eye Contact:  Good  Speech:  Clear and Coherent  Volume:  Normal  Mood:  Euthymic  Affect:  Appropriate  Thought Process:  Goal Directed  Orientation:  Full (Time, Place, and Person)  Thought Content: Logical   Suicidal Thoughts:  No  Homicidal  Thoughts:  No  Memory:  WNL  Judgement:  Good  Insight:  Good  Psychomotor Activity:  Normal  Concentration:  Concentration: Good  Recall:  Good  Fund of Knowledge: Good  Language: Good  Assets:  Desire for Improvement  ADL's:  Intact  Cognition: WNL  Prognosis:  Good    DIAGNOSES:    ICD-10-CM   1. Major depressive disorder, recurrent episode, moderate (HCC) F33.1   2. Restless leg syndrome G25.81   3. Anxiety F41.9     Receiving Psychotherapy: Yes With Rockne Menghiniebbie Dowd, LCSW   RECOMMENDATIONS: We discussed the fact that it is been a while since she has had a sleep study.  I recommend she see the neurologist who ordered that.  If she needs a referral I will be happy to send 1. Increase Mirapex to 3 mg nightly. Continue Abilify 2 mg daily. Continue Wellbutrin XL 300 mg daily. Continue Lexapro 20 mg daily. Continue hydroxyzine 25 mg 3 times daily as needed. Continue Ativan as needed Continue psychotherapy with Rockne Menghiniebbie Dowd, LCSW. Return in 8 to 10 weeks.    Melony Overlyeresa Mavis Fichera, PA-C

## 2018-06-21 ENCOUNTER — Other Ambulatory Visit: Payer: Self-pay | Admitting: Physician Assistant

## 2018-06-25 ENCOUNTER — Ambulatory Visit: Payer: 59 | Admitting: Psychiatry

## 2018-06-25 ENCOUNTER — Other Ambulatory Visit: Payer: Self-pay | Admitting: Physician Assistant

## 2018-06-25 DIAGNOSIS — F331 Major depressive disorder, recurrent, moderate: Secondary | ICD-10-CM

## 2018-06-25 NOTE — Progress Notes (Signed)
      [facility] Counselor/Therapist Progress Note  Patient ID: Helen Greene, MRN: [id number],    Date: 06/25/2018  Time Spent:  58 minutes  Treatment Type: Individual Therapy  Reported Symptoms:  Anxiety, depression, stressed financially  Mental Status Exam:  Appearance:   Casual     Behavior:  Appropriate and Sharing  Motor:  Normal  Speech/Language:   Normal Rate  Affect:  Congruent  Mood:  anxious and depressed  Thought process:  normal  Thought content:    WNL  Sensory/Perceptual disturbances:    WNL  Orientation:  oriented to person, place, time/date, situation, day of week, month of year and year  Attention:  Good  Concentration:  Good  Memory:  WNL  Fund of knowledge:   Good  Insight:    Fair  Judgment:   Good  Impulse Control:  Fair   Risk Assessment: Danger to Self:  No Self-injurious Behavior: No Danger to Others: No Duty to Warn:no Physical Aggression / Violence:No  Access to Firearms a concern: No  Gang Involvement:No   Subjective:  Patient in today with symptoms of depression and anxiety.  Having some significant financial stressors--is figuring out a way to "just get caught up for now" and then 'wants to plan ahead better with emergency fund."  Is working to keep as positive outlook as possible right now and know that (as she reports) she can get through this time.  Worked with some CBT strategies re: positive thoughts that will lead to better feelings and more productive behaviors.  Interventions: Cognitive Behavioral Therapy and Solution-Oriented/Positive Psychology  Diagnosis:   ICD-10-CM   1. Major depressive disorder, recurrent episode, moderate (HCC) F33.1     Plan: Patient to follow up with strategies mentioned above.  Will check with resources to help her make financial decisions about best way of catching up financially without more loans.  Will see again in 1 wk.   Mathis Fareeborah Jilleen Essner, LCSW

## 2018-07-02 ENCOUNTER — Ambulatory Visit (INDEPENDENT_AMBULATORY_CARE_PROVIDER_SITE_OTHER): Payer: 59 | Admitting: Psychiatry

## 2018-07-02 ENCOUNTER — Telehealth: Payer: Self-pay

## 2018-07-02 DIAGNOSIS — F331 Major depressive disorder, recurrent, moderate: Secondary | ICD-10-CM | POA: Diagnosis not present

## 2018-07-02 NOTE — Telephone Encounter (Signed)
Received fax in the mail from [facility] to refill patients omeprazole and telmisartan HCTZ. Patient was first a patient of MW then requested to switch over to RB. When patient was with MW he provided her with the omeprazole and telmisartan medications. When patient was last seen by RB he stated that the patient could follow up as needed.   RB or MW please advise if we can refill these medications and which it should go under or if we should defer to patients PCP since she is being seen only as needed here in pulmonary. Thank you.

## 2018-07-02 NOTE — Progress Notes (Signed)
      [facility] Counselor/Therapist Progress Note  Patient ID: Helen Greene, MRN: [id number],    Date: 07/02/2018  Time Spent:  45 minutes  Treatment Type: Individual Therapy   Reported Symptoms: not sleeping well, depressed mood, anxiety  Mental Status Exam:  Appearance:   Casual     Behavior:  Appropriate and Sharing  Motor:  Normal  Speech/Language:   Normal Rate  Affect:  Depressed  Mood:  anxious and depressed  Thought process:  normal  Thought content:    WNL  Sensory/Perceptual disturbances:    WNL  Orientation:  oriented to person, place, time/date, situation, day of week, month of year and year  Attention:  Good  Concentration:  Good  Memory:  WNL  Fund of knowledge:   Good  Insight:    Fair  Judgment:   Fair  Impulse Control:  Fair   Risk Assessment: Danger to Self:  No Self-injurious Behavior: No Danger to Others: No Duty to Warn:no Physical Aggression / Violence:No  Access to Firearms a concern: No  Gang Involvement:No   Subjective:  Patient in today with decreased sleep, depressed mood, and anxiety.  Tearful as she recently learned a friend was moving out of state; this was a friend she saw almost daily so is anticipating the impact. Talked about some strategies that can help with loss, accepting the feelings, and eventually move forward.  More problems at work which we processed as well and it led to discussion on appropriate responses in communication and how we say something is just as important as what we say.  Has made some limited progress in working to generate more positive thought patterns.  Reviewed those techniques and also the need for ongoing good self-care (physical, spiritual, social, emotional).  Encouraged to work on decreasing the self-negating.    Interventions: Cognitive Behavioral Therapy and Ego-Supportive  Diagnosis:   ICD-10-CM   1. Major depressive disorder, recurrent episode, moderate (HCC) F33.1     Plan:   Patient to  continue work on increasing positive thought patterns, practice strategies we've discussed in sessions re: better communication at work and within family, and over-all self care.  Mathis Fare, LCSW

## 2018-07-03 ENCOUNTER — Telehealth: Payer: Self-pay

## 2018-07-03 MED ORDER — BUPROPION HCL ER (XL) 300 MG PO TB24: 300.0000 mg | ORAL_TABLET | ORAL | 0 refills | Status: DC

## 2018-07-03 MED ORDER — ESCITALOPRAM OXALATE 20 MG PO TABS: 20.0000 mg | ORAL_TABLET | Freq: Every day | ORAL | 0 refills | Status: DC

## 2018-07-03 MED ORDER — PRAMIPEXOLE DIHYDROCHLORIDE ER 3 MG PO TB24: 3.0000 mg | ORAL_TABLET | Freq: Every day | ORAL | 0 refills | Status: DC

## 2018-07-03 MED ORDER — ARIPIPRAZOLE 2 MG PO TABS: 2.0000 mg | ORAL_TABLET | Freq: Every morning | ORAL | 0 refills | Status: DC

## 2018-07-03 NOTE — Telephone Encounter (Signed)
I called pt but she did not answer. There was no VM to leave message. Will try again later.

## 2018-07-03 NOTE — Telephone Encounter (Signed)
Called patient on numbers provided. Unable to reach patient as mailbox was full and we could not leave a message. I will pend medications and not send them in until we have verified with the patient that she does want them sent to [facility] and not the local pharmacy as well as make sure that she is not receiving them from another physician already. Will await phone call back.   Will route this message to triage to follow up on incase the patient calls back.

## 2018-07-03 NOTE — Telephone Encounter (Signed)
Message will be routed back to Corrine as this message did not originate in triage.

## 2018-07-03 NOTE — Telephone Encounter (Signed)
Go ahead and refill the medication for 6 months and asked her to transition these prescriptions to her PCP since we plan to continue them going forward.

## 2018-07-03 NOTE — Telephone Encounter (Signed)
Received faxes from [facility] requesting refills for pt's Aripiprazole, escitalopram, pramipexole, and bupropion for 90 day supply.  Refills submitted as requested.  Next OV 0302/2020 Last OV 06/16/2018

## 2018-07-03 NOTE — Telephone Encounter (Signed)
Patient returned call, she states she does want med sent in to [facility] and not local pharmacy.  CB [phone].

## 2018-07-06 MED ORDER — TELMISARTAN-HCTZ 40-12.5 MG PO TABS: 1.0000 | ORAL_TABLET | Freq: Every day | ORAL | 5 refills | Status: DC

## 2018-07-06 MED ORDER — OMEPRAZOLE 20 MG PO CPDR: 20.0000 mg | DELAYED_RELEASE_CAPSULE | Freq: Every day | ORAL | 5 refills | Status: DC

## 2018-07-06 NOTE — Telephone Encounter (Signed)
Called and spoke with patient, advised her of message below. Patient is aware and verbalized understanding. Medications sent in. Nothing further needed.

## 2018-07-09 ENCOUNTER — Ambulatory Visit (INDEPENDENT_AMBULATORY_CARE_PROVIDER_SITE_OTHER): Payer: 59 | Admitting: Psychiatry

## 2018-07-09 DIAGNOSIS — F331 Major depressive disorder, recurrent, moderate: Secondary | ICD-10-CM

## 2018-07-09 NOTE — Progress Notes (Signed)
      [facility] Counselor/Therapist Progress Note  Patient ID: Helen Greene, MRN: [id number],    Date: 07/09/2018  Time Spent:  58 minutes  Treatment Type: Individual Therapy   Reported Symptoms: sleep better this past week, some depressed mood, anxiety  Mental Status Exam:  Appearance:   Casual     Behavior:  Appropriate and Sharing  Motor:  Normal  Speech/Language:   Normal Rate  Affect:  Depressed  Mood:  Some anxiety and depressed mood (depression not as strong today)  Thought process:  normal  Thought content:    WNL  Sensory/Perceptual disturbances:    WNL  Orientation:  oriented to person, place, time/date, situation, day of week, month of year and year  Attention:  Good  Concentration:  Good  Memory:  WNL  Fund of knowledge:   Good  Insight:    Fair  Judgment:   Fair  Impulse Control:  Fair   Risk Assessment: Danger to Self:  No Self-injurious Behavior: No Danger to Others: No Duty to Warn:no Physical Aggression / Violence:No  Access to Firearms a concern: No  Gang Involvement:No   Subjective:  Patient in today with improved sleep this past week, some depressed mood, and anxiety.  Talked more about friend was moving out of state; this was a friend she saw almost daily so knows it will be hard to not see him. Talked about some strategies that can help with loss, accepting the feelings, and eventually move forward.  Problems at work which we processed a well as appropriate responses in communication and how we say something is just as important as what we say.  Has made some progress past week in working to generate more positive thought patterns, and gave several examples of how she tried using the CBT strategies discussed in sessions.  Self-judgement has been worse more recently and we talked about this today.  Reviewed those techniques and also the need for ongoing good self-care (physical, spiritual, social, emotional).  Encouraged to work on decreasing the  self-negating.   Affect more full today as she smiles more today and laughs occasionally.  Interventions: Cognitive Behavioral Therapy and Ego-Supportive  Diagnosis:   ICD-10-CM   1. Major depressive disorder, recurrent episode, moderate (HCC) F33.1     Plan:   Patient to continue work on increasing positive thought patterns, practice strategies we've discussed in sessions re: better communication at work and within family, and over-all self care. Working to reduce self-negating / over-personalizing at work and in personal/family situations.  Mathis Fare, LCSW

## 2018-07-15 ENCOUNTER — Ambulatory Visit: Payer: 59 | Admitting: Psychiatry

## 2018-07-15 ENCOUNTER — Ambulatory Visit (INDEPENDENT_AMBULATORY_CARE_PROVIDER_SITE_OTHER): Payer: 59 | Admitting: Obstetrics & Gynecology

## 2018-07-15 ENCOUNTER — Encounter: Payer: Self-pay | Admitting: Obstetrics & Gynecology

## 2018-07-15 VITALS — BP 120/70

## 2018-07-15 DIAGNOSIS — Z113 Encounter for screening for infections with a predominantly sexual mode of transmission: Secondary | ICD-10-CM

## 2018-07-15 DIAGNOSIS — E66812 Obesity, class 2: Secondary | ICD-10-CM

## 2018-07-15 DIAGNOSIS — Z6839 Body mass index (BMI) 39.0-39.9, adult: Secondary | ICD-10-CM

## 2018-07-15 DIAGNOSIS — N898 Other specified noninflammatory disorders of vagina: Secondary | ICD-10-CM | POA: Diagnosis not present

## 2018-07-15 DIAGNOSIS — R300 Dysuria: Secondary | ICD-10-CM

## 2018-07-15 DIAGNOSIS — E6609 Other obesity due to excess calories: Secondary | ICD-10-CM

## 2018-07-15 DIAGNOSIS — R102 Pelvic and perineal pain unspecified side: Secondary | ICD-10-CM

## 2018-07-15 LAB — WET PREP FOR TRICH, YEAST, CLUE

## 2018-07-15 NOTE — Progress Notes (Signed)
    Helen Greene 06-17-1971 [id number]        48 y.o.  [id number] Divorced.  Boyfriend  RP: Vaginal discharge with odor  HPI: S/P Total Hysterectomy.  Menopause on no HRT.  Increased vaginal discharge with itching, treated with Monistat.  Now vaginal discharge with odor.  Feeling pelvic pressure/pain.  Some urinary burning and frequency. Tendency for constipation.  Some discomfort with IC.   OB History  Gravida Para Term Preterm AB Living  3 2     1 2   SAB TAB Ectopic Multiple Live Births      1        # Outcome Date GA Lbr Len/2nd Weight Sex Delivery Anes PTL Lv  3 Ectopic           2 Para           1 Para             Past medical history,surgical history, problem list, medications, allergies, family history and social history were all reviewed and documented in the EPIC chart.   Directed ROS with pertinent positives and negatives documented in the history of present illness/assessment and plan.  Exam:  Vitals:   07/15/18 1134  BP: 120/70   General appearance:  Normal  Abdomen: Normal  Gynecologic exam: Vulva normal.  Speculum:  Vagina normal, but increased vaginal discharge.  Wet prep and Gono-Chlam done.  Bimanual exam:  Absent Uterus/cervix.  Mildly tender, no mass felt.  Wet prep: clue cells present  U/A: Yellow clear, nitrites negative, white blood cells negative, red blood cells negative, bacteria moderate.  Urine culture pending.   Assessment/Plan:  48 y.o. [id number]   1. Vaginal discharge Bacterial vaginosis clinically and per wet prep.  Will treat with tinidazole.  Usage reviewed and prescription sent to pharmacy.  Fluconazole after finishing the antibiotics to treat or prevent yeast vaginitis.  Recommend probiotic tablet once weekly for prevention. - WET PREP FOR TRICH, YEAST, CLUE  2. Dysuria Will wait on urine culture to decide on treatment.  Push water intake recommended. - Urinalysis,Complete w/RFL Culture  3. Screening examination for venereal  disease Condom use recommended. - C. trachomatis/N. gonorrhoeae RNA  4. Pelvic pain in female Status post total hysterectomy.  Feeling pelvic pressure/mild pain.  Mildly tender on pelvic exam today but no pelvic mass felt.  Follow-up pelvic ultrasound to further investigate. - US Transvaginal Non-OB; Future  5. Class 2 obesity due to excess calories without serious comorbidity with body mass index (BMI) of 39.0 to 39.9 in adult BMI 39.26 in 01/2018.  Lost weight previously on Phentermine, well tolerated.  Would like to restart x 3 months.  Low calorie/carb diet such as [employer] recommended.  Aerobic physical activities 5 times a week and weightlifting every 2 days.  Other orders - REFLEXIVE URINE CULTURE - Urine Culture - tinidazole (TINDAMAX) 500 MG tablet; Take 4 tablets (2,000 mg total) by mouth daily for 2 days. - fluconazole (DIFLUCAN) 150 MG tablet; Take 1 tablet (150 mg total) by mouth once for 1 dose. - phentermine 37.5 MG capsule; Take 1 capsule (37.5 mg total) by mouth every morning for 30 days.  Counseling on above issues and coordination of care more than 50% for 25 minutes.  Genia Del MD, 11:39 AM 07/15/2018

## 2018-07-16 ENCOUNTER — Telehealth: Payer: Self-pay | Admitting: *Deleted

## 2018-07-16 LAB — C. TRACHOMATIS/N. GONORRHOEAE RNA
C. trachomatis RNA, TMA: NOT DETECTED
N. gonorrhoeae RNA, TMA: NOT DETECTED

## 2018-07-16 MED ORDER — PHENTERMINE HCL 37.5 MG PO CAPS: 37.5000 mg | ORAL_CAPSULE | ORAL | 0 refills | Status: DC

## 2018-07-16 MED ORDER — FLUCONAZOLE 150 MG PO TABS: 150.0000 mg | ORAL_TABLET | Freq: Once | ORAL | 2 refills | Status: AC

## 2018-07-16 MED ORDER — TINIDAZOLE 500 MG PO TABS: 2.0000 g | ORAL_TABLET | Freq: Every day | ORAL | 0 refills | Status: AC

## 2018-07-16 NOTE — Telephone Encounter (Signed)
Tell her I'm sorry, I was sending it yesterday and my computor froze while I was trying and then I forgot to try again.  I sent the 3 prescriptions now.

## 2018-07-16 NOTE — Telephone Encounter (Signed)
Patient was seen yesterday for problem visit, states 3 Rx's were to be sent to pharmacy. Patient went to pharmacy and Rx's are not there. Please advise

## 2018-07-17 ENCOUNTER — Encounter: Payer: Self-pay | Admitting: *Deleted

## 2018-07-17 LAB — URINALYSIS, COMPLETE W/RFL CULTURE
Bilirubin Urine: NEGATIVE
Glucose, UA: NEGATIVE
HYALINE CAST: NONE SEEN /LPF
Hgb urine dipstick: NEGATIVE
KETONES UR: NEGATIVE
Leukocyte Esterase: NEGATIVE
Nitrites, Initial: NEGATIVE
RBC / HPF: NONE SEEN /HPF (ref 0–2)
Specific Gravity, Urine: 1.02 (ref 1.001–1.03)
Squamous Epithelial / HPF: >=60 /HPF — AB (ref <=5)
WBC UA: NONE SEEN /HPF (ref 0–5)
pH: 7 (ref 5.0–8.0)

## 2018-07-17 LAB — URINE CULTURE
MICRO NUMBER:: 127415
SPECIMEN QUALITY:: ADEQUATE

## 2018-07-17 LAB — CULTURE INDICATED

## 2018-07-17 NOTE — Telephone Encounter (Signed)
Unable to leave message on voicemail Rx's have been sent, voicemail is full.

## 2018-07-17 NOTE — Telephone Encounter (Signed)
Patient informed. 

## 2018-07-22 ENCOUNTER — Encounter: Payer: Self-pay | Admitting: Obstetrics & Gynecology

## 2018-07-22 ENCOUNTER — Ambulatory Visit (INDEPENDENT_AMBULATORY_CARE_PROVIDER_SITE_OTHER): Payer: 59 | Admitting: Psychiatry

## 2018-07-22 DIAGNOSIS — F331 Major depressive disorder, recurrent, moderate: Secondary | ICD-10-CM | POA: Diagnosis not present

## 2018-07-22 NOTE — Patient Instructions (Signed)
1. Vaginal discharge Bacterial vaginosis clinically and per wet prep.  Will treat with tinidazole.  Usage reviewed and prescription sent to pharmacy.  Fluconazole after finishing the antibiotics to treat or prevent yeast vaginitis.  Recommend probiotic tablet once weekly for prevention. - WET PREP FOR TRICH, YEAST, CLUE  2. Dysuria Will wait on urine culture to decide on treatment.  Push water intake recommended. - Urinalysis,Complete w/RFL Culture  3. Screening examination for venereal disease Condom use recommended. - C. trachomatis/N. gonorrhoeae RNA  4. Pelvic pain in female Status post total hysterectomy.  Feeling pelvic pressure/mild pain.  Mildly tender on pelvic exam today but no pelvic mass felt.  Follow-up pelvic ultrasound to further investigate. - US Transvaginal Non-OB; Future  5. Class 2 obesity due to excess calories without serious comorbidity with body mass index (BMI) of 39.0 to 39.9 in adult BMI 39.26 in 01/2018.  Lost weight previously on Phentermine, well tolerated.  Would like to restart x 3 months.  Low calorie/carb diet such as [employer] recommended.  Aerobic physical activities 5 times a week and weightlifting every 2 days.  Other orders - REFLEXIVE URINE CULTURE - Urine Culture - tinidazole (TINDAMAX) 500 MG tablet; Take 4 tablets (2,000 mg total) by mouth daily for 2 days. - fluconazole (DIFLUCAN) 150 MG tablet; Take 1 tablet (150 mg total) by mouth once for 1 dose. - phentermine 37.5 MG capsule; Take 1 capsule (37.5 mg total) by mouth every morning for 30 days.  Genara, it was a pleasure seeing you today!  I will inform you of your results as soon as they are available.

## 2018-07-22 NOTE — Progress Notes (Signed)
      [facility] Counselor/Therapist Progress Note  Patient ID: Helen Greene, MRN: [id number],    Date: 07/22/2018  Time Spent:  60 minutes  Treatment Type: Individual Therapy   Reported Symptoms: sleep still better, depressed mood (although some better), anxiety  Mental Status Exam:  Appearance:   Casual     Behavior:  Appropriate and Sharing  Motor:  Normal  Speech/Language:   Normal Rate  Affect:  Depressed  Mood:  Some anxiety and depressed mood (depression not as strong today)  Thought process:  normal  Thought content:    WNL  Sensory/Perceptual disturbances:    WNL  Orientation:  oriented to person, place, time/date, situation, day of week, month of year and year  Attention:  Good  Concentration:  Good  Memory:  WNL  Fund of knowledge:   Good  Insight:    Fair  Judgment:   Fair  Impulse Control:  Fair   Risk Assessment: Danger to Self:  No Self-injurious Behavior: No Danger to Others: No Duty to Warn:no Physical Aggression / Violence:No  Access to Firearms a concern: No  Gang Involvement:No   Subjective:  Patient in today with some depressed mood, and anxiety, and some additional work frustrations.  Talked more about friend was moving out of state; this was a friend she saw almost daily so knows it will be hard to not see him. Talked more about some strategies that can help with loss, accepting the feelings, and eventually move forward, as friend is to move away, although doesn't have specific date.  Due to work issues, we reviewed  appropriate responses in communication and emphasized how we say something is just as important as what we say. Is making more progress in working to generate more positive thought patterns. Reviewed CBT strategies. Discussed self-negating and ways to continue working on letting go of those messages to herself. Reviewed those techniques and also the need for ongoing good self-care (physical, spiritual, social, emotional).  Encouraged to  work on decreasing the self-negating.   Brought up 2 things at end of session--discussed briefly and will continue next session (family past, friend [name]).  Interventions: Cognitive Behavioral Therapy and Ego-Supportive  Diagnosis:   ICD-10-CM   1. Major depressive disorder, recurrent episode, moderate (HCC) F33.1     Plan:   Patient to continue work on increasing positive thought patterns, practice strategies we've discussed in sessions re: better communication at work and within family, and over-all self care. Working to reduce self-negating / over-personalizing at work and in personal/family situations.  Mathis Fareeborah Zaylah Blecha, LCSW

## 2018-07-24 ENCOUNTER — Other Ambulatory Visit: Payer: Self-pay

## 2018-07-24 MED ORDER — TELMISARTAN-HCTZ 40-12.5 MG PO TABS: 1.0000 | ORAL_TABLET | Freq: Every day | ORAL | 0 refills | Status: DC

## 2018-07-29 ENCOUNTER — Ambulatory Visit (INDEPENDENT_AMBULATORY_CARE_PROVIDER_SITE_OTHER): Payer: 59 | Admitting: Obstetrics & Gynecology

## 2018-07-29 ENCOUNTER — Encounter: Payer: Self-pay | Admitting: Obstetrics & Gynecology

## 2018-07-29 ENCOUNTER — Ambulatory Visit (INDEPENDENT_AMBULATORY_CARE_PROVIDER_SITE_OTHER): Payer: 59

## 2018-07-29 ENCOUNTER — Ambulatory Visit (INDEPENDENT_AMBULATORY_CARE_PROVIDER_SITE_OTHER): Payer: 59 | Admitting: Psychiatry

## 2018-07-29 ENCOUNTER — Other Ambulatory Visit: Payer: Self-pay | Admitting: Obstetrics & Gynecology

## 2018-07-29 VITALS — BP 128/86

## 2018-07-29 DIAGNOSIS — N83299 Other ovarian cyst, unspecified side: Secondary | ICD-10-CM | POA: Diagnosis not present

## 2018-07-29 DIAGNOSIS — R102 Pelvic and perineal pain: Secondary | ICD-10-CM

## 2018-07-29 DIAGNOSIS — N83202 Unspecified ovarian cyst, left side: Secondary | ICD-10-CM

## 2018-07-29 DIAGNOSIS — F331 Major depressive disorder, recurrent, moderate: Secondary | ICD-10-CM | POA: Diagnosis not present

## 2018-07-29 NOTE — Patient Instructions (Signed)
1. Pelvic pain in female Pelvic pain for about 1 month/dyspareunia.  Pelvic ultrasound findings reviewed with patient.  Functional left ovarian cyst 3.9 cm.  Patient reassured and precautions discussed.  Will call back if acute pelvic pain or worsening of symptoms.  Declines progestin-only birth control pill at this time.  2. Functional cyst of ovary Left functional ovarian cyst measured at 3.9 cm.  No free fluid in the pelvis.  Precautions discussed, decision to observe.  Follow-up annual gynecologic exam.  Helen Greene, it was a pleasure seeing you today!

## 2018-07-29 NOTE — Progress Notes (Signed)
      [facility] Counselor/Therapist Progress Note  Patient ID: Helen Greene, MRN: [id number],    Date: 07/29/2018  Time Spent:  58 minutes  Treatment Type: Individual Therapy   Reported Symptoms: sleep better, depressed mood (better), anxiety  Mental Status Exam:  Appearance:   Casual     Behavior:  Appropriate and Sharing  Motor:  Normal  Speech/Language:   Normal Rate  Affect:  Depressed  Mood:  Some anxiety and depressed mood (depression not as strong today)  Thought process:  normal  Thought content:    WNL  Sensory/Perceptual disturbances:    WNL  Orientation:  oriented to person, place, time/date, situation, day of week, month of year and year  Attention:  Good  Concentration:  Good  Memory:  WNL  Fund of knowledge:   Good  Insight:    Fair  Judgment:   Fair  Impulse Control:  Fair   Risk Assessment: Danger to Self:  No Self-injurious Behavior: No Danger to Others: No Duty to Warn:no Physical Aggression / Violence:No  Access to Firearms a concern: No  Gang Involvement:No   Subjective:  Patient had brought up 2 things at end of last session--discussed briefly and will continue next session (family past, friend Junious Dresser). We followed up on both of these today.  Talked about the friendship with "Junious Dresser" and realizing she allows herself to get used and it tends to repeat itself.    Patient in today with some depressed mood, and anxiety.   Talked more about friend was moving out of state; this was a friend she saw almost daily so knows it will be hard to not see him.  Reviewed CBT strategies. Discussed self-negating and ways to continue working on letting go of those messages to herself. Reviewed those techniques and also the need for ongoing good self-care (physical, spiritual, social, emotional).  Encouraged to work on decreasing the self-negating and look at the healthiness or lack of healthiness.    Interventions: Cognitive Behavioral Therapy and  Ego-Supportive  Diagnosis:   ICD-10-CM   1. Major depressive disorder, recurrent episode, moderate (HCC) F33.1     Plan:   Patient to continue work on setting appropriate bounaries with people who take advantage of her, to increase positive thought patterns, practice strategies we've discussed in sessions re: better communication at work and within family, and over-all self care. Working to reduce self-negating / over-personalizing at work and in personal/family situations.   Mathis Fare, LCSW

## 2018-07-29 NOTE — Progress Notes (Signed)
    Helen Greene June 21, 1970 [id number]        48 y.o.  [id number]   RP: Left pelvic pain  HPI: S/P Total Hysterectomy.  Left pelvic pain/tenderness x about 1 month.  Had pain with IC 05/2018.  Intermittent hot flushes.     OB History  Gravida Para Term Preterm AB Living  3 2     1 2   SAB TAB Ectopic Multiple Live Births      1        # Outcome Date GA Lbr Len/2nd Weight Sex Delivery Anes PTL Lv  3 Ectopic           2 Para           1 Para             Past medical history,surgical history, problem list, medications, allergies, family history and social history were all reviewed and documented in the EPIC chart.   Directed ROS with pertinent positives and negatives documented in the history of present illness/assessment and plan.  Exam:  Vitals:   07/29/18 1520  BP: 128/86   General appearance:  Normal  Pelvic US today: T/V and T/a images.  Status post total hysterectomy.  Right ovary not seen due to excessive bowel activity.  Left ovary with a thin-walled echo-free cyst measuring 3.9 x 2.7 x 3.4 cm,   negative color flow Doppler to the cyst.  Vaginal cuff negative.  No free fluid in the posterior cul-de-sac.   Assessment/Plan:  48 y.o. [id number]   1. Pelvic pain in female Pelvic pain for about 1 month/dyspareunia.  Pelvic ultrasound findings reviewed with patient.  Functional left ovarian cyst 3.9 cm.  Patient reassured and precautions discussed.  Will call back if acute pelvic pain or worsening of symptoms.  Declines progestin-only birth control pill at this time.  2. Functional cyst of ovary Left functional ovarian cyst measured at 3.9 cm.  No free fluid in the pelvis.  Precautions discussed, decision to observe.  Follow-up annual gynecologic exam.  Counseling on above issues and coordination of care more than 50% for 15 minutes. Genia Del MD, 3:36 PM 07/29/2018

## 2018-08-05 ENCOUNTER — Ambulatory Visit (INDEPENDENT_AMBULATORY_CARE_PROVIDER_SITE_OTHER): Payer: 59 | Admitting: Psychiatry

## 2018-08-05 DIAGNOSIS — F331 Major depressive disorder, recurrent, moderate: Secondary | ICD-10-CM

## 2018-08-05 NOTE — Progress Notes (Signed)
      [facility] Counselor/Therapist Progress Note  Patient ID: Helen Greene, MRN: [id number],    Date: 08/05/2018  Time Spent:  58 minutes  Treatment Type: Individual Therapy   Reported Symptoms:  depressed mood (better), anxiety  Mental Status Exam:  Appearance:   Casual     Behavior:  Appropriate and Sharing  Motor:  Normal  Speech/Language:   Normal Rate  Affect:  Depressed  Mood:  Some anxiety and depressed mood (depression not as strong today)  Thought process:  normal  Thought content:    WNL  Sensory/Perceptual disturbances:    WNL  Orientation:  oriented to person, place, time/date, situation, day of week, month of year and year  Attention:  Good  Concentration:  Good  Memory:  WNL  Fund of knowledge:   Good  Insight:    Fair  Judgment:   Fair  Impulse Control:  Fair   Risk Assessment: Danger to Self:  No Self-injurious Behavior: No Danger to Others: No Duty to Warn:no Physical Aggression / Violence:No  Access to Firearms a concern: No  Gang Involvement:No   Subjective:  Spent some time with "friend" who is reportedly moving soon.  Patient not seeming as sad about that.    Patient in today with some depressed mood, and anxiety.  Expressed feelings re: family and friends.  Wants to be closer to 39 yr old son that lives with dad.  Worked on communication issues that may sabotage some of her efforts in reaching out to son.  Emphasized listening as well as talking, and that "how we say something is as important as what we say".  Very tearful off and on during session today, mostly re: family situations and hx with ex- husband.   Reviewed CBT strategies. Discussed self-negating and ways to continue working on letting go of those messages to herself. Reviewed those techniques and also the need for ongoing good self-care (physical, spiritual, social, emotional).  Encouraged to work on decreasing the self-negating and look at the healthiness or lack of healthiness.   Lots of work needed on forgiveness issues with Acupuncturist (re: affair). Discussed how she can work some on this between sessions.  Interventions: Cognitive Behavioral Therapy and Ego-Supportive  Diagnosis:   ICD-10-CM   1. Major depressive disorder, recurrent episode, moderate (HCC) F33.1     Plan:   Patient to continue work on setting appropriate bounaries with people who take advantage of her, to increase positive thought patterns, practice strategies we've discussed in sessions re: better communication at work and within family, and over-all self care. Working to reduce self-negating / over-personalizing at work and in personal/family situations.   Mathis Fare, LCSW

## 2018-08-12 ENCOUNTER — Ambulatory Visit (INDEPENDENT_AMBULATORY_CARE_PROVIDER_SITE_OTHER): Payer: 59 | Admitting: Psychiatry

## 2018-08-12 DIAGNOSIS — F331 Major depressive disorder, recurrent, moderate: Secondary | ICD-10-CM

## 2018-08-12 NOTE — Progress Notes (Signed)
      [facility] Counselor/Therapist Progress Note  Patient ID: Helen Greene, MRN: [id number],    Date: 08/12/2018  Time Spent:  58 minutes  Treatment Type: Individual Therapy   Reported Symptoms:  depressed mood (better), anxiety  Mental Status Exam:  Appearance:   Casual     Behavior:  Appropriate and Sharing  Motor:  Normal  Speech/Language:   Normal Rate  Affect:  Depressed  Mood:  Some anxiety and depressed mood (depression not as strong today)  Thought process:  normal  Thought content:    WNL  Sensory/Perceptual disturbances:    WNL  Orientation:  oriented to person, place, time/date, situation, day of week, month of year and year  Attention:  Good  Concentration:  Good  Memory:  WNL  Fund of knowledge:   Good  Insight:    Fair  Judgment:   Fair  Impulse Control:  Fair   Risk Assessment: Danger to Self:  No Self-injurious Behavior: No Danger to Others: No Duty to Warn:no Physical Aggression / Violence:No  Access to Firearms a concern: No  Gang Involvement:No   Subjective:  Patient in today with symptoms noted above and issues with 52yr old son, long time friend struggling with alcohol abuse.  Talks more of wanting to be closer to 54 yr old son that lives with dad.  Worked on communication issues that may sabotage some of her efforts in reaching out to son.  Emphasized listening as well as talking, and that "how we say something is as important as what we say".  "I feel very wound up today" but as she talked through her concerns, she was noticeable more caom.Vented a lot in session re: her 17 yr old son, her close friend, and also her need to be healthier.    Focused more on her need and interest in being healthier "lose weight', tone muscles, and help with stress.  Talked at length about this and used some CBT strategies to reinforce the positive effects physical activity can have.   ** Plans to take advantage of a free offer tonight for women's intro boxing  class. Is hoping that will get her more interested in exercise.  Checked-in with her on self-negating and ways to continue working on letting go of those messages to herself. Reviewed those techniques and also the need for ongoing good self-care (physical, spiritual, social, emotional).  Encouraged to work on decreasing the self-negating and look at the healthiness or lack of healthiness.  Lots of work needed on forgiveness issues with Acupuncturist (re: affair). Discussed how she can work some on this between sessions.  Interventions: Cognitive Behavioral Therapy and Ego-Supportive  Diagnosis:   ICD-10-CM   1. Major depressive disorder, recurrent episode, moderate (HCC) F33.1     Plan:   Patient to continue work on setting appropriate boundaries with people who take advantage of her, to increase positive thought patterns, practice strategies we've discussed in sessions re: better communication at work and within family, and over-all self care. Working to reduce self-negating / over-personalizing at work and in personal/family situations.   Mathis Fare, LCSW

## 2018-08-17 ENCOUNTER — Ambulatory Visit: Payer: 59 | Admitting: Psychiatry

## 2018-08-17 ENCOUNTER — Ambulatory Visit: Payer: 59 | Admitting: Physician Assistant

## 2018-08-17 ENCOUNTER — Institutional Professional Consult (permissible substitution): Payer: Self-pay | Admitting: Neurology

## 2018-08-18 ENCOUNTER — Ambulatory Visit (INDEPENDENT_AMBULATORY_CARE_PROVIDER_SITE_OTHER): Payer: 59 | Admitting: Psychiatry

## 2018-08-18 DIAGNOSIS — F331 Major depressive disorder, recurrent, moderate: Secondary | ICD-10-CM | POA: Diagnosis not present

## 2018-08-18 NOTE — Progress Notes (Signed)
      [facility] Counselor/Therapist Progress Note  Patient ID: Helen Greene, MRN: [id number],    Date: 08/18/2018  Time Spent:  57 minutes  Treatment Type: Individual Therapy   Reported Symptoms:  depressed mood (better), anxiety, frustration  Mental Status Exam:  Appearance:   Casual     Behavior:  Appropriate and Sharing  Motor:  Normal  Speech/Language:   Normal Rate  Affect:  Depressed  Mood:  Some anxiety and depressed mood (depression not as strong today)  Thought process:  normal  Thought content:    WNL  Sensory/Perceptual disturbances:    WNL  Orientation:  oriented to person, place, time/date, situation, day of week, month of year and year  Attention:  Good  Concentration:  Good  Memory:  WNL  Fund of knowledge:   Good  Insight:    Fair  Judgment:   Fair  Impulse Control:  Fair   Risk Assessment: Danger to Self:  No Self-injurious Behavior: No Danger to Others: No Duty to Warn:no Physical Aggression / Violence:No  Access to Firearms a concern: No  Gang Involvement:No   Subjective:  Patient in today with symptoms noted above and continued work on her relationship with 96 yr old son. Worked more on communication issues that may sabotage some of her efforts in reaching out to son.  Emphasized listening as well as talking, and that "how we say something is as important as what we say".   Additional focus/support on her need and interest in being healthier "lose weight', tone muscles, and help with stress.  Talked at length about this and used some CBT strategies to reinforce the positive effects physical activity can have.  *Also attended a free kickboxing class last week and enjoyed it and found it to be challenging.  May continue for some more classes.   Checked-in with her on self-negating and ways to continue working on letting go of those messages to herself. Reviewed those techniques and also the need for ongoing good self-care (physical, spiritual, social,  emotional).  Encouraged to work on decreasing the self-negating and look at the healthiness of being more positive with herself.   Interventions: Cognitive Behavioral Therapy and Ego-Supportive  Diagnosis:   ICD-10-CM   1. Major depressive disorder, recurrent episode, moderate (HCC) F33.1     Plan:   Patient to continue work on increasing positive thought patterns, practicing strategies we've discussed in sessions re: better communication at work and within family, and over-all self care. Working to reduce self-negating / over-personalizing at work and in personal/family situations.   Mathis Fare, LCSW

## 2018-08-24 ENCOUNTER — Ambulatory Visit (INDEPENDENT_AMBULATORY_CARE_PROVIDER_SITE_OTHER): Payer: 59 | Admitting: Psychiatry

## 2018-08-24 ENCOUNTER — Ambulatory Visit (INDEPENDENT_AMBULATORY_CARE_PROVIDER_SITE_OTHER): Payer: 59 | Admitting: Physician Assistant

## 2018-08-24 ENCOUNTER — Encounter: Payer: Self-pay | Admitting: Physician Assistant

## 2018-08-24 DIAGNOSIS — F331 Major depressive disorder, recurrent, moderate: Secondary | ICD-10-CM

## 2018-08-24 DIAGNOSIS — G2581 Restless legs syndrome: Secondary | ICD-10-CM

## 2018-08-24 DIAGNOSIS — F411 Generalized anxiety disorder: Secondary | ICD-10-CM

## 2018-08-24 MED ORDER — ARIPIPRAZOLE 2 MG PO TABS: 2.0000 mg | ORAL_TABLET | Freq: Every morning | ORAL | 0 refills | Status: DC

## 2018-08-24 MED ORDER — ESCITALOPRAM OXALATE 20 MG PO TABS: 20.0000 mg | ORAL_TABLET | Freq: Every day | ORAL | 0 refills | Status: DC

## 2018-08-24 MED ORDER — BUPROPION HCL ER (XL) 300 MG PO TB24: 300.0000 mg | ORAL_TABLET | ORAL | 0 refills | Status: DC

## 2018-08-24 NOTE — Progress Notes (Signed)
      [facility] Counselor/Therapist Progress Note  Patient ID: Helen Greene, MRN: [id number],    Date: 08/24/2018  Time Spent:  60 minutes  Treatment Type: Individual Therapy   Reported Symptoms:  depressed mood (better), anxiety, frustration  Mental Status Exam:  Appearance:   Casual     Behavior:  Appropriate and Sharing  Motor:  Normal  Speech/Language:   Normal Rate  Affect:  Anxious  Mood:  Some anxiety and depressed mood (depression not as strong today)  Thought process:  normal  Thought content:    WNL  Sensory/Perceptual disturbances:    WNL  Orientation:  oriented to person, place, time/date, situation, day of week, month of year and year  Attention:  Good  Concentration:  Good  Memory:  WNL  Fund of knowledge:   Good  Insight:    Fair  Judgment:   Fair  Impulse Control:  Fair   Risk Assessment: Danger to Self:  No Self-injurious Behavior: No Danger to Others: No Duty to Warn:no Physical Aggression / Violence:No  Access to Firearms a concern: No  Gang Involvement:No   Subjective:  Patient continue work on her relationship with 49 yr old son , older adult son, and ex-husband Acupuncturist.  Worked more on communication issues that  sabotage some of her efforts in communicating with her younger son.  Emphasized listening as well as talking, and that "how we say something is as important as what we say".   Also discussed forgiveness and "letting go" which is much needed for her to move forward.  Very difficult for patient.  Reviewed her thoughts and interest in being healthier "lose weight', tone muscles, and help with stress.  Talked at length about this and used some CBT strategies to reinforce the positive effects physical activity can have.  Has some interest in taking a women's boxing class, where she recently attended a free class.    Patient reports difficulty with the stopping of self-negating.  She does show some progress and we worked on ways to continue  working on letting go of those messages to herself. Reviewed helpful techniques and also the need for ongoing good self-care (physical, spiritual, social, emotional).  Encouraged to look at the healthiness of being more positive with herself.   Interventions: Cognitive Behavioral Therapy and Ego-Supportive  Diagnosis:   ICD-10-CM   1. Major depressive disorder, recurrent episode, moderate (HCC) F33.1     Plan:   Patient to continue work on increasing positive thought patterns, practicing strategies we've discussed in sessions re: better communication at work and within family, decreasing the self-negating, decrease the tendency to look at what may go wrong versus right, and ways to improve over-all self-care, and not over-personalizing at work and in personal/family situations.   Mathis Fare, LCSW

## 2018-08-24 NOTE — Progress Notes (Signed)
[facility] Med Check  Patient ID: Helen Greene,  MRN: [id number]  PCP: Doristine Bosworth, MD  Date of Evaluation: 08/24/18 Time spent:15 minutes  Chief Complaint:  Chief Complaint    Follow-up      HISTORY/CURRENT STATUS: HPI here for routine med check.  When she was last seen approximately 2 months ago.  Since then, she has been doing fairly well as far as her medications are concerned. Had anxiety really bad a few days a few weeks ago but states that was triggered by a situation that she was in.  She did not want to elaborate.  She is doing better now.  This past weekend, she states she made some "dumb decisions" and she has therapy with Rockne Menghini, LCSW later on this afternoon and will discuss that with her.  She does not want to talk about it with me.  Overall, her mood has been fairly stable. Patient denies loss of interest in usual activities and is able to enjoy things.  Denies decreased energy or motivation.  Appetite has not changed.  No extreme sadness, tearfulness, or feelings of hopelessness.  Denies any changes in concentration, making decisions or remembering things.  Denies suicidal or homicidal thoughts.  She had to go off of the pramipexole due to cost.  States the restless leg syndrome has not worsened however and she is fine right now not taking any medication for it.  Denies muscle or joint pain, stiffness, or dystonia.  Denies dizziness, syncope, seizures, numbness, tingling, tremor, tics, unsteady gait, slurred speech, confusion.   Individual Medical History/ Review of Systems: Changes? :No    Past medications for mental health diagnoses include: Wellbutrin XL, Risperdal caused edema, Lexapro, Mirapex, Vistaril, questionably Xanax, Abilify, Lamictal, Klonopin, pramipexole for restless legs  Allergies: Bee venom; Enablex [darifenacin hydrobromide]; Erythromycin; Macrobid [nitrofurantoin monohyd macro]; Metronidazole; Nitrofuran derivatives; and Risperidone  and related  Current Medications:  Current Outpatient Medications:  .  ARIPiprazole (ABILIFY) 2 MG tablet, Take 1 tablet (2 mg total) by mouth every morning., Disp: 90 tablet, Rfl: 0 .  azelastine (ASTELIN) 0.1 % nasal spray, Place 2 sprays into both nostrils 3 (three) times daily., Disp: 30 mL, Rfl: 5 .  buPROPion (WELLBUTRIN XL) 300 MG 24 hr tablet, Take 1 tablet (300 mg total) by mouth every morning., Disp: 90 tablet, Rfl: 0 .  cyclobenzaprine (FLEXERIL) 5 MG tablet, Take 1 tablet (5 mg total) by mouth 3 (three) times daily as needed for muscle spasms., Disp: 90 tablet, Rfl: 6 .  escitalopram (LEXAPRO) 20 MG tablet, Take 1 tablet (20 mg total) by mouth daily., Disp: 90 tablet, Rfl: 0 .  loratadine (CLARITIN) 10 MG tablet, Take 1 tablet (10 mg total) by mouth daily., Disp: 30 tablet, Rfl: 5 .  LORazepam (ATIVAN) 0.5 MG tablet, Take 0.5 mg by mouth every 8 (eight) hours., Disp: , Rfl:  .  omeprazole (PRILOSEC) 20 MG capsule, Take 1 capsule (20 mg total) by mouth daily., Disp: 30 capsule, Rfl: 5 .  telmisartan-hydrochlorothiazide (MICARDIS HCT) 40-12.5 MG tablet, Take 1 tablet by mouth daily., Disp: 90 tablet, Rfl: 0 .  fluticasone (FLONASE) 50 MCG/ACT nasal spray, Place 2 sprays into both nostrils daily. (Patient not taking: Reported on 08/24/2018), Disp: 16 g, Rfl: 5 .  mirabegron ER (MYRBETRIQ) 50 MG TB24 tablet, Take 50 mg by mouth daily., Disp: , Rfl:  .  ondansetron (ZOFRAN) 4 MG tablet, Take 1 tablet (4 mg total) by mouth every 8 (eight) hours as needed for  nausea or vomiting. (Patient not taking: Reported on 08/24/2018), Disp: 20 tablet, Rfl: 0 .  phentermine 37.5 MG capsule, Take 1 capsule (37.5 mg total) by mouth every morning for 30 days., Disp: 30 capsule, Rfl: 0 .  Pramipexole Dihydrochloride (MIRAPEX ER) 3 MG TB24, Take 1 tablet (3 mg total) by mouth at bedtime. (Patient not taking: Reported on 08/24/2018), Disp: 90 tablet, Rfl: 0 .  valACYclovir (VALTREX) 1000 MG tablet, TAKE 2 TABLETS BY  MOUTH EVERY 12 HOURS AS NEEDED FOR ONE DAY WITH OUTBREAK (Patient not taking: Reported on 08/24/2018), Disp: 20 tablet, Rfl: 0 Medication Side Effects: none  Family Medical/ Social History: Changes? No  MENTAL HEALTH EXAM:  Last menstrual period 09/07/2015.There is no height or weight on file to calculate BMI.  General Appearance: Casual, Well Groomed and Obese  Eye Contact:  Good  Speech:  Clear and Coherent  Volume:  Normal  Mood:  Euthymic  Affect:  Appropriate  Thought Process:  Goal Directed  Orientation:  Full (Time, Place, and Person)  Thought Content: Logical   Suicidal Thoughts:  No  Homicidal Thoughts:  No  Memory:  WNL  Judgement:  Good  Insight:  Good  Psychomotor Activity:  Normal  Concentration:  Concentration: Good  Recall:  Good  Fund of Knowledge: Good  Language: Good  Assets:  Desire for Improvement  ADL's:  Intact  Cognition: WNL  Prognosis:  Good    DIAGNOSES: No diagnosis found.  Receiving Psychotherapy: Yes With Rockne Menghini, LCSW   RECOMMENDATIONS: Continue Abilify 2 mg p.o. every morning. Continue Wellbutrin XL 300 mg every morning. Continue Lexapro 20 mg p.o. daily. Continue Ativan 0.5 mg 3 times daily as needed.  PDMP was reviewed. Continue psychotherapy with Rockne Menghini LCSW Return in 3 months or sooner as needed.  Melony Overly, PA-C   This record has been created using [employer].  Chart creation errors have been sought, but may not always have been located and corrected. Such creation errors do not reflect on the standard of medical care.

## 2018-08-31 ENCOUNTER — Ambulatory Visit: Payer: 59 | Admitting: Psychiatry

## 2018-09-01 ENCOUNTER — Ambulatory Visit (INDEPENDENT_AMBULATORY_CARE_PROVIDER_SITE_OTHER): Payer: 59 | Admitting: Psychiatry

## 2018-09-01 ENCOUNTER — Other Ambulatory Visit: Payer: Self-pay

## 2018-09-01 DIAGNOSIS — F331 Major depressive disorder, recurrent, moderate: Secondary | ICD-10-CM | POA: Diagnosis not present

## 2018-09-01 NOTE — Progress Notes (Signed)
      [facility] Counselor/Therapist Progress Note  Patient ID: Helen Greene, MRN: [id number],    Date: 09/01/2018  Time Spent:  60 minutes  Treatment Type: Individual Therapy   Reported Symptoms:  depressed mood, anxiety, frustration, fear  Mental Status Exam:  Appearance:   Casual     Behavior:  Appropriate and Sharing  Motor:  Normal  Speech/Language:   Normal Rate  Affect:  Anxious  Mood:  Some anxiety and depressed mood (depression not as strong today)  Thought process:  normal  Thought content:    WNL  Sensory/Perceptual disturbances:    WNL  Orientation:  oriented to person, place, time/date, situation, day of week, month of year and year  Attention:  Good  Concentration:  Good  Memory:  WNL  Fund of knowledge:   Good  Insight:    Fair  Judgment:   Fair  Impulse Control:  Fair   Risk Assessment: Danger to Self:  No Self-injurious Behavior: No Danger to Others: No Duty to Warn:no Physical Aggression / Violence:No  Access to Firearms a concern: No  Gang Involvement:No   Subjective:   Patient in today with symptoms noted above.  We reviewed discussion on communication strategies from last session and patient feels she is trying harder to change her communication---both the talking part and the listening part.  Emphasized that "how we say something is as important as what we say".   Also discussed forgiveness and "letting go" which is much needed for her to move forward.  Very difficult for patient. Admits that she doesn't always make good assessment of situations nor people and tends to go by how something or people "make me feel" versus whether or not situations or people are healthy for her.   Asked about her interest in losing weight to help her physically and help with stress.  Has some interest in taking a women's boxing class, but is not able to participate for at least another month.  Talked about some other physical exercises that she could use and some  online resources, and also consider developing a habit to walk her dog each day.      Reviewed the need for ongoing good self-care (physical, spiritual, social, emotional).  Encouraged to look at the healthiness of being more positive with herself.   Interventions: Cognitive Behavioral Therapy and Ego-Supportive  Diagnosis:   ICD-10-CM   1. Major depressive disorder, recurrent episode, moderate (HCC) F33.1     Plan:   Patient to continue work on increasing positive thought patterns, practicing strategies we've discussed in sessions re: better communication at work and within family, decreasing the self-negating, decrease the tendency to look at what may go wrong versus right, and ways to improve over-all self-care, and not over-personalizing at work and within the family.  Patient feels that she is working on all of these and some progress is being made.   Mathis Fare, LCSW

## 2018-09-07 ENCOUNTER — Ambulatory Visit (INDEPENDENT_AMBULATORY_CARE_PROVIDER_SITE_OTHER): Payer: 59 | Admitting: Psychiatry

## 2018-09-07 ENCOUNTER — Other Ambulatory Visit: Payer: Self-pay

## 2018-09-07 DIAGNOSIS — F331 Major depressive disorder, recurrent, moderate: Secondary | ICD-10-CM

## 2018-09-07 NOTE — Progress Notes (Signed)
      [facility] Counselor/Therapist Progress Note  Patient ID: Helen Greene, MRN: [id number],    Date: 09/07/2018  Time Spent:  60 minutes  Treatment Type: Individual Therapy   Reported Symptoms:  depressed mood, anxiety, frustration, fear  Mental Status Exam:  Appearance:   Casual     Behavior:  Appropriate and Sharing  Motor:  Normal  Speech/Language:   Normal Rate  Affect:  Anxious  Mood:  Some anxiety and depressed mood   Thought process:  normal  Thought content:    WNL  Sensory/Perceptual disturbances:    WNL  Orientation:  oriented to person, place, time/date, situation, day of week, month of year and year  Attention:  Good  Concentration:  Good  Memory:  WNL  Fund of knowledge:   Good  Insight:    Fair  Judgment:   Fair  Impulse Control:  Fair   Risk Assessment: Danger to Self:  No Self-injurious Behavior: No Danger to Others: No Duty to Warn:no Physical Aggression / Violence:No  Access to Firearms a concern: No  Gang Involvement:No   Patient in today with her symptoms noted above.  Is concerned about the coronavirus situation and has some understandable fears and anxiety but is managing rather well.  Talked about what she feels she is doing to help manage her stress, anxiety, and depressed mood.  Patient shares that she has tried to be more positive and has not been as self-negating in her self-talk.  Also noticed that working from home, she has been in better mood.  "Not being around so many negative people, has helped me not be so negative."   Still needing to work on communication strategies and patient feels she is trying harder to change her communication---both the talking part and the listening part. Talked further from last session about issues of forgiveness and letting go of past hurts and resentments in order to move forward without baggage from past. This remains difficult for patient and she is willing to aim towards that.    Reviewed the need for  ongoing good self-care (physical, spiritual, social, emotional).  Encouraged to look at the healthiness of being more positive with herself. Encouraged her to continue some physical activity.  Talked about some other physical exercises that she could use and some online resources.  Wants to turn her dog-walking into a daily habit.      Interventions: Cognitive Behavioral Therapy and Ego-Supportive  Diagnosis:   ICD-10-CM   1. Major depressive disorder, recurrent episode, moderate (HCC) F33.1     Plan:   Patient to continue work on increasing positive thought patterns, practicing strategies we've discussed in sessions recently and today re: better communication at work and within family, decreasing the self-negating, decrease the tendency to look at what may go wrong versus right, and ways to improve over-all self-care, and not over-personalizing at work and within the family.  Progress noted with patient as we reflected on her goals.  Return in 1-2 wks.  Mathis Fare, LCSW

## 2018-09-14 ENCOUNTER — Telehealth: Payer: Self-pay

## 2018-09-14 ENCOUNTER — Ambulatory Visit (INDEPENDENT_AMBULATORY_CARE_PROVIDER_SITE_OTHER): Payer: 59 | Admitting: Psychiatry

## 2018-09-14 ENCOUNTER — Ambulatory Visit: Payer: 59 | Admitting: Psychiatry

## 2018-09-14 ENCOUNTER — Other Ambulatory Visit: Payer: Self-pay

## 2018-09-14 DIAGNOSIS — F331 Major depressive disorder, recurrent, moderate: Secondary | ICD-10-CM

## 2018-09-14 NOTE — Telephone Encounter (Signed)
Pt returned my call, and I discussed this with her. She does not want a virtual or telephone visit. She would prefer to delay her appt until May. A new appt was made for her. Pt verbalized understanding of new appt date and time.

## 2018-09-14 NOTE — Progress Notes (Signed)
[facility] Counselor/Therapist Progress Note  Patient ID: Helen Greene, MRN: [id number],    Date: 09/14/2018  Time Spent:  60 minutes     7:58a.m. to 8:58a.m.   Treatment Type: Individual Therapy   Virtual Visit via Telephone Note I connected with patient by a video enabled telemedicine application or telephone, with their informed consent, and verified patient privacy and that I am speaking with the correct person using two identifiers. I am at [facility] and patient is at her home.   I discussed the limitations, risks, security and privacy concerns of performing psychotherapy and management service by telephone and the availability of in person appointments. I also discussed with the patient that there may be a patient responsible charge related to this service. The patient expressed understanding and agreed to proceed.  I discussed the treatment planning with the patient. The patient was provided an opportunity to ask questions and all were answered. The patient agreed with the plan and demonstrated an understanding of the instructions.   The patient was advised to call  our office if  symptoms worsen or feel they are in a crisis state and need immediate contact.   Reported Symptoms:  depressed mood, anxiety, frustration, fear  Mental Status Exam:  Appearance:      n/a  Behavior:  sharing  Motor:  Normal  Speech/Language:   Normal Rate  Affect:  n/a  Mood:  Some anxiety and depressed mood   Thought process:  normal  Thought content:    WNL  Sensory/Perceptual disturbances:    WNL  Orientation:  oriented to person, place, time/date, situation, day of week, month of year and year  Attention:  Good  Concentration:  Good  Memory:  WNL  Fund of knowledge:   Good  Insight:    Fair  Judgment:   Fair  Impulse Control:  Fair   Risk Assessment: Danger to Self:  No Self-injurious Behavior: No Danger to Others: No Duty to Warn:no Physical Aggression /  Violence:No  Access to Firearms a concern: No  Gang Involvement:No   Patient shares symptoms as noted above.  Remains very concerned about the coronavirus situation and has some understandable fears.  Lives alone with her dog who is good comfort but really missing being around people.  Some tearfulness as she talks about the isolation affecting her mood.  Discussed some ways of dealing with the isolation including getting outside in her yard some, speaking to neighbors at a safe distance, reaching out to friends and family more via technology, walking her dog, music, and exercise.  Encouraged her to decrease her overthinking and second-guessing herself. Does report that she has decreased the self-negating some, especially in her self-talk.    Continuing to work on communication strategies and patient feels she is trying harder to change her communication---both the talking part and the listening part.  I notice in our communication that she is trying to make some changes.   States that she has not worked much on the issues of  forgiveness and letting go of past hurts and resentments in order to move forward without baggage from past. This remains difficult for patient and today she tearfully explains the difficulty in all that these issue mean to her and the broken trust involved. Says she may try to work on this some in our next session.   Reviewed the need for ongoing good self-care (physical, spiritual, social, emotional).  Emphasized the healthiness of being more positive  with herself. Encouraged some physical activity. Reviewed some other physical exercises that she could use and reminded her of online resources.      Interventions: Cognitive Behavioral Therapy and Ego-Supportive  Diagnosis:   ICD-10-CM   1. Major depressive disorder, recurrent episode, moderate (HCC) F33.1     Plan:   Patient to continue work on increasing positive thought patterns, practicing strategies we've discussed in  sessions recently and today re: better communication at work and within family, decreasing the self-negating, decrease the tendency to look at what may go wrong versus right, and ways to improve over-all self-care including physical exercise, and not over-personalizing at work and within the family.  Progress noted with patient and encouraged her to keep up her efforts. Next session in 1-2 wks.  Mathis Fare, LCSW

## 2018-09-14 NOTE — Telephone Encounter (Signed)
Due to current COVID 19 pandemic, our office is severely reducing in office visits for at least the next 2 weeks, in order to minimize the risk to our patients and healthcare providers.   I called pt to discuss this. Home and mobile number do not have available VM. Will try again later.

## 2018-09-16 ENCOUNTER — Institutional Professional Consult (permissible substitution): Payer: Self-pay | Admitting: Neurology

## 2018-09-20 ENCOUNTER — Other Ambulatory Visit: Payer: Self-pay | Admitting: Physician Assistant

## 2018-09-21 ENCOUNTER — Ambulatory Visit (INDEPENDENT_AMBULATORY_CARE_PROVIDER_SITE_OTHER): Payer: 59 | Admitting: Psychiatry

## 2018-09-21 ENCOUNTER — Other Ambulatory Visit: Payer: Self-pay

## 2018-09-21 DIAGNOSIS — F331 Major depressive disorder, recurrent, moderate: Secondary | ICD-10-CM | POA: Diagnosis not present

## 2018-09-21 NOTE — Progress Notes (Signed)
[facility] Counselor/Therapist Progress Note  Patient ID: Helen Greene, MRN: [id number],    Date: 09/21/2018  Time Spent:  60 minutes     11:55am to 12:55pm   Treatment Type: Individual Therapy   Virtual Visit via Telephone Note: (Repeated efforts to establish a video connection with patient I connected with patient by a video enabled telemedicine application or telephone, with their informed consent, and verified patient privacy and that I am speaking with the correct person using two identifiers. [facility] and patient is at her home.   I discussed the limitations, risks, security and privacy concerns of performing psychotherapy and management service by telephone and the availability of in person appointments. I also discussed with the patient that there may be a patient responsible charge related to this service. The patient expressed understanding and agreed to proceed.  I discussed the treatment planning with the patient. The patient was provided an opportunity to ask questions and all were answered. The patient agreed with the plan and demonstrated an understanding of the instructions.   The patient was advised to call  our office if  symptoms worsen or feel they are in a crisis state and need immediate contact.  Reported Symptoms:  depressed mood, anxiety, frustration, fear  Mental Status Exam:  Appearance:      n/a  Behavior:  sharing  Motor:  Normal  Speech/Language:   Normal Rate  Affect:  n/a  Mood:  anxiety and depressed mood   Thought process:  normal  Thought content:    WNL  Sensory/Perceptual disturbances:    WNL  Orientation:  oriented to person, place, time/date, situation, day of week, month of year and year  Attention:  Good  Concentration:  Good  Memory:  WNL  Fund of knowledge:   Good  Insight:    Fair  Judgment:   Fair  Impulse Control:  Fair   Risk Assessment: Danger to Self:  No Self-injurious Behavior: No Danger to  Others: No Duty to Warn:no Physical Aggression / Violence:No  Access to Firearms a concern: No  Gang Involvement:No   Patient very concerned about the coronavirus situation and has some understandable fears.  Really missing being around people as she lives alone and is having to work at home.  Some tearfulness as she talks about the isolation affecting her mood and after talking through that some, she sounded more encouraging.  Reviewed some ways of dealing with the isolation including getting outside in her yard some, speaking to neighbors at a safe distance, reaching out to friends and family more via technology, walking her dog, music, and physical exercise.  Encouraged her to work more on decreasing her overthinking and second-guessing herself. Negative self-talk has decreased some.   States that, due to all the coronavirus issues, she has not worked much on the issues of  forgiveness and letting go of past hurts and resentments in order to move forward without baggage from past. This remains difficult for patient due to the broken trust involved. States she wants to work on this some in our next session.   Reviewed the need for ongoing good self-care (physical, spiritual, social, emotional).  Emphasized the healthiness of being more positive with herself. Encouraged some physical activity. Reviewed some other physical exercises that she could use and reminded her of online resources.      Interventions: Cognitive Behavioral Therapy and Ego-Supportive  Diagnosis:   ICD-10-CM   1. Major depressive disorder,  recurrent episode, moderate (HCC) F33.1     Plan:   Patient to continue work on increasing positive thought patterns, practicing strategies we've discussed in sessions recently and today re: better communication at work and within family, continue decreasing any self-negative talk, decrease the tendency to look at what may go wrong versus right, and ways to improve over-all self-care  including physical exercise, and not over-personalizing at work and within the family.  Progress noted with patient and encouraged her to keep up her efforts. Next session in 1-2 wks.  Mathis Fare, LCSW

## 2018-09-21 NOTE — Telephone Encounter (Signed)
Not due yet this is for next 90 day

## 2018-09-23 NOTE — Telephone Encounter (Signed)
I called pt to talk about doing a virtual visit again. If pt still declined was going to r/s out till June. Unable to get in contact with the pt. Home number was constantly busy. Voicemail was full on cell number. Will try pt again at a later date.

## 2018-09-29 ENCOUNTER — Other Ambulatory Visit: Payer: Self-pay

## 2018-09-29 ENCOUNTER — Ambulatory Visit (INDEPENDENT_AMBULATORY_CARE_PROVIDER_SITE_OTHER): Payer: 59 | Admitting: Psychiatry

## 2018-09-29 DIAGNOSIS — F331 Major depressive disorder, recurrent, moderate: Secondary | ICD-10-CM

## 2018-09-29 NOTE — Progress Notes (Signed)
[facility] Counselor/Therapist Progress Note  Patient ID: Helen Greene, MRN: [id number],    Date: 09/29/2018  Time Spent:  60 minutes     12:00 noon to 1:00pm   Treatment Type: Individual Therapy   Virtual Visit via Telephone Note:  I connected with patient by a video enabled telemedicine application or telephone, with their informed consent, and verified patient privacy and that I am speaking with the correct person using two identifiers. I am at [facility] and patient is at her home.   I discussed the limitations, risks, security and privacy concerns of performing psychotherapy and management service by telephone and the availability of in person appointments. I also discussed with the patient that there may be a patient responsible charge related to this service. The patient expressed understanding and agreed to proceed.  I discussed the treatment planning with the patient. The patient was provided an opportunity to ask questions and all were answered. The patient agreed with the plan and demonstrated an understanding of the instructions.   The patient was advised to call  our office if  symptoms worsen or feel they are in a crisis state and need immediate contact.  Reported Symptoms:  depressed mood, anxiety, frustration, fear  Mental Status Exam:  Appearance:      n/a  Behavior:  sharing  Motor:  Normal  Speech/Language:   Normal Rate  Affect:  n/a  Mood:  anxiety and depressed mood   Thought process:  normal  Thought content:    WNL  Sensory/Perceptual disturbances:    WNL  Orientation:  oriented to person, place, time/date, situation, day of week, month of year and year  Attention:  Good  Concentration:  Good  Memory:  WNL  Fund of knowledge:   Good  Insight:    Fair  Judgment:   Fair  Impulse Control:  Fair   Risk Assessment: Danger to Self:  No Self-injurious Behavior: No Danger to Others: No Duty to Warn:no Physical Aggression / Violence:No   Access to Firearms a concern: No  Gang Involvement:No    Subjective:  Patient reports symptoms noted above.  Some depression and sadness re: her former mother-in-law who is in hospice care due to her cancer.  No visitation due to coronavirus so that makes her feel "more apart" from people.  Processed a lot of her sadness about former mother-in-law and how it reminded of her mom.  Just talking through her feelings seemed to help her emotionally.  Patient still finding it difficult to be at peace during this pandemic time.  Wants to be able to get out more and be back around people.  Understands that we need to be careful but she is really missing th contact of others.  Talked about engaging more with others via technology and phone.  Also how she might get outside more and walk some but keeping safe distances from others.  Wanting to eat healthier, especially low-carb diet.  "Need to get better on my follow-up whether it's in goals, or follow up with people, or in making positive changes.  Discussed what helps and hurts her motivation level. Talked about different strategies, including "chunking" (splitting up)  her tasks to make them feel more do-able and less overwhelming,  as well as re-framing some of her ways of looking at things to see what she can control and what she cannot control.    Reviewed some ways of dealing with the isolation including getting outside in  her yard some, speaking to neighbors at a safe distance, reaching out to friends and family more via technology, walking her dog, music, and physical exercise.  Encouraged her to work more on decreasing her overthinking and second-guessing herself. Negative self-talk has decreased some.  Still wants to work on the forgiveness issues mentioned in prior session but "has not been quite ready yet."     Reviewed the need for ongoing good self-care (physical, spiritual, social, emotional).  Emphasized the healthiness of being more positive with  herself and recognizing her choice in some things and how she approaches situations. Encouraged some physical activity. Reviewed some other physical exercises that she could use and reminded her of online resources.      Interventions: Cognitive Behavioral Therapy and Ego-Supportive  Diagnosis:   ICD-10-CM   1. Major depressive disorder, recurrent episode, moderate (HCC) F33.1     Plan:   Patient to continue work on increasing positive thought patterns, practicing strategies we've discussed in sessions recently and today re: better communication at work and within family, continue decreasing any self-negative talk, decrease the tendency to look at what may go wrong versus right, and ways to improve over-all self-care including physical exercise, and not over-personalizing at work and within the family.  Progress noted with patient and encouraged her to keep up her efforts. Next session in 1-2 wks.  Mathis Fareeborah Victoriano Campion, LCSW

## 2018-10-06 ENCOUNTER — Ambulatory Visit: Payer: 59 | Admitting: Psychiatry

## 2018-10-07 ENCOUNTER — Other Ambulatory Visit: Payer: Self-pay

## 2018-10-07 ENCOUNTER — Ambulatory Visit (INDEPENDENT_AMBULATORY_CARE_PROVIDER_SITE_OTHER): Payer: 59 | Admitting: Psychiatry

## 2018-10-07 ENCOUNTER — Ambulatory Visit: Payer: Self-pay | Admitting: Family Medicine

## 2018-10-07 DIAGNOSIS — F331 Major depressive disorder, recurrent, moderate: Secondary | ICD-10-CM

## 2018-10-07 NOTE — Progress Notes (Signed)
[facility] Counselor/Therapist Progress Note  Patient ID: Helen Greene, MRN: [id number],    Date: 10/07/2018  Time Spent:  60 minutes     12:00noon to 1:00pm   Treatment Type: Individual Therapy   Virtual Visit via Telephone Note:  I connected with patient by a video enabled telemedicine application or telephone, with their informed consent, and verified patient privacy and that I am speaking with the correct person using two identifiers. [facility] and patient is at her home.   I discussed the limitations, risks, security and privacy concerns of performing psychotherapy and management service by telephone and the availability of in person appointments. I also discussed with the patient that there may be a patient responsible charge related to this service. The patient expressed understanding and agreed to proceed.  I discussed the treatment planning with the patient. The patient was provided an opportunity to ask questions and all were answered. The patient agreed with the plan and demonstrated an understanding of the instructions.   The patient was advised to call  our office if  symptoms worsen or feel they are in a crisis state and need immediate contact.  Reported Symptoms:  depressed mood has decreased some, anxiety, fear about COVID-19  Mental Status Exam:  Appearance:    casual  Behavior:  sharing  Motor:  Normal  Speech/Language:   Normal Rate  Affect:  anxious  Mood:  anxiety and depressed mood   Thought process:  normal  Thought content:    WNL  Sensory/Perceptual disturbances:    WNL  Orientation:  oriented to person, place, time/date, situation, day of week, month of year and year  Attention:  Good  Concentration:  Good  Memory:  WNL  Fund of knowledge:   Good  Insight:    Fair  Judgment:   Fair  Impulse Control:  Fair   Risk Assessment: Danger to Self:  No Self-injurious Behavior: No Danger to Others: No Duty to Warn:no Physical  Aggression / Violence:No  Access to Firearms a concern: No  Gang Involvement:No    Subjective:  Patient reports symptoms noted above, although has decreased her fears some about the coronavirus. The social distancing has been very difficult for her. No visiting other people due to coronavirus so that makes her feel more isolated and alone but does enjoy her dog.  Some  depression and sadness re: her former mother-in-law who is in hospice care due to her cancer.  More processing of her sadness about former mother-in-law and how it reminded of her mom and previous marriage.   Reminded about engaging more with others via technology and phone.  Also how she might get outside more and walk some but keeping safe distances from others. Followed up today on being more consistent and committed in goals for herself such as wanting to eat healthier, especially low-carb diet.  Patient acknowledges she needs to get better on her follow-up whether it's in goals, or follow up with people, or in making any of her desired positive changes. Motivation has been lower more recently.  Discussed what helps and hurts her motivation level. Talked again about different strategies, including "chunking" (splitting up)  her tasks to make them feel more do-able and less overwhelming,  as well as re-framing some of her ways of looking at things to see what she can control and what she cannot control.   dog, music, and physical exercise. Encouraged her to work more on decreasing  her overthinking and second-guessing herself. Patient  has made progress in decreasing her negative self-talk.   Reinforced ongoing good self-care (physical, spiritual, social, emotional).  Reminded her of the healthiness of being more positive with herself and recognizing her choice in some things and how she approaches situations. Encouraged physical activity and reviewed some other physical exercises that she could use and reminded her of online resources  since gyms are still currently closed.      Interventions: Cognitive Behavioral Therapy and Ego-Supportive  Diagnosis:   ICD-10-CM   1. Major depressive disorder, recurrent episode, moderate (HCC) F33.1     Plan:   Patient to continue work on increasing positive thought patterns, practicing strategies we've discussed in sessions recently.  To continue practice of better communication at work and within family, continue decreasing any self-negative talk, decrease the tendency to look at what may go wrong versus right, and ways to improve over-all self-care including physical exercise, and not over-personalizing at work and within the family.  Progress noted with patient and encouraged her to keep up her efforts. Next session in 1-2 wks.  Mathis Fare, LCSW

## 2018-10-07 NOTE — Telephone Encounter (Signed)
  Pt called in c/o a dry cough that is getting worse over the past 2 weeks.  No sick exposures or travel history.   No fever.   Feels a little short of breath with the coughing.  I warm transferred her call to [facility] at [facility] on [address] to schedule a virtual visit due to the COVID-19 pandemic.   Reason for Disposition . [1] Continuous (nonstop) coughing interferes with work or school AND [2] no improvement using cough treatment per protocol  Answer Assessment - Initial Assessment Questions 1. ONSET: "When did the cough begin?"      When I breath out it makes me cough.   It's getting worse but not with every breath.    2 weeks ago Saturday.     I sprayed some bees with spray and I've felt this way ever since. 2. SEVERITY: "How bad is the cough today?"      *No Answer* 3. RESPIRATORY DISTRESS: "Describe your breathing."      Shortness of breath.    It feels tight in the middle of my chest. 4. FEVER: "Do you have a fever?" If so, ask: "What is your temperature, how was it measured, and when did it start?"     No 5. HEMOPTYSIS: "Are you coughing up any blood?" If so ask: "How much?" (flecks, streaks, tablespoons, etc.)     No 6. TREATMENT: "What have you done so far to treat the cough?" (e.g., meds, fluids, humidifier)     Mucinix.   My sinuses are hurting also. 7. CARDIAC HISTORY: "Do you have any history of heart disease?" (e.g., heart attack, congestive heart failure)      No 8. LUNG HISTORY: "Do you have any history of lung disease?"  (e.g., pulmonary embolus, asthma, emphysema)     I've had pneumonia twice last year. 9. PE RISK FACTORS: "Do you have a history of blood clots?" (or: recent major surgery, recent prolonged travel, bedridden)     No 10. OTHER SYMPTOMS: "Do you have any other symptoms? (e.g., runny nose, wheezing, chest pain)       I was exposed to paint also.   Sinuses hurt.   Chest tightness and a dry cough 11. PREGNANCY: "Is there any chance you are pregnant?" "When  was your last menstrual period?"       No 12. TRAVEL: "Have you traveled out of the country in the last month?" (e.g., travel history, exposures)       No travels.   No sick exposures.  Protocols used: COUGH - ACUTE NON-PRODUCTIVE-A-AH

## 2018-10-08 ENCOUNTER — Telehealth (INDEPENDENT_AMBULATORY_CARE_PROVIDER_SITE_OTHER): Payer: 59 | Admitting: Family Medicine

## 2018-10-08 ENCOUNTER — Other Ambulatory Visit: Payer: Self-pay

## 2018-10-08 DIAGNOSIS — J302 Other seasonal allergic rhinitis: Secondary | ICD-10-CM | POA: Diagnosis not present

## 2018-10-08 NOTE — Progress Notes (Signed)
Random Dry/reactive  Cough for the last 2 weeks.

## 2018-10-08 NOTE — Progress Notes (Signed)
Telemedicine Encounter- SOAP NOTE Established Patient  I discussed the limitations, risks, security and privacy concerns of performing an evaluation and management service by telephone and the availability of in person appointments. I also discussed with the patient that there may be a patient responsible charge related to this service. The patient expressed understanding and agreed to proceed.  This telephone encounter was conducted with the patient's (or proxy's) verbal consent via audio telecommunications: yes Patient was instructed to have this encounter in a suitably private space; and to only have persons present to whom they give permission to participate. In addition, patient identity was confirmed by use of name plus two identifiers (DOB and address).  I spent a total of 20min talking with the patient or their proxy.   Subjective   Helen Greene is a 48 y.o. @GENDER @ established patient. Telephone visit today for dry cough.   HPI Fatigue in the evenings over the last 2 weeks-pt concerned as pneumonia x 2 within the last year. Pt has sinus issues-nausea-no productive cough, no fever pt states she has not been using nasal sprays or claritin. No change in urinary or bowel symptoms Second hand smoke-pt not a smoker currently Allergy testing in the past-many years ago No asthma mucinex-currently-not helping flonase-not using currently claritin-not using currently  Patient Active Problem List   Diagnosis Date Noted  . Major depressive disorder, recurrent episode, moderate (HCC) 04/01/2018  . Dyspnea 01/29/2018  . Hoarseness 12/30/2017  . Abnormal CT scan of lung 12/30/2017  . AKI (acute kidney injury) (HCC) 08/15/2017  . Sepsis due to pneumonia (HCC) 08/15/2017  . Right sided abdominal pain 08/15/2017  . History of tonsillectomy Feb 25,2019 08/15/2017  . Hypertension 08/15/2017  . Sleep apnea 08/15/2017  . Anxiety 08/15/2017  . Restless leg syndrome 08/15/2017  .  Post-op pneumonia 08/15/2017  . Morbid obesity with BMI of 40.0-44.9, adult (HCC) 08/15/2017  . Shock liver 08/15/2017  . S/P tonsillectomy 08/11/2017  . S/P total hysterectomy 09/14/2015  . Frequent headaches 03/17/2015  . RLS (restless legs syndrome) 03/17/2015  . Essential hypertension 05/19/2013  . Elevated white blood cell count 09/25/2011    Past Medical History:  Diagnosis Date  . Allergy   . Anxiety   . Depression   . Elevated white blood cell count   . Elevated white blood cell count 09/25/2011  . GERD (gastroesophageal reflux disease)   . Headache    Migraine  . Hypertension   . Kidney stone   . Neuromuscular disorder (HCC)    carpel tunnel disease  . Pneumonia 08/15/2017  . Restless leg syndrome   . Shortness of breath dyspnea    with exertion  . Sleep apnea    does not have CPAP machine, mostly affects back sleeping was encouraged to sleep on her sides  . Tonsillar calculus     Current Outpatient Medications  Medication Sig Dispense Refill  . ARIPiprazole (ABILIFY) 2 MG tablet Take 1 tablet (2 mg total) by mouth every morning. 90 tablet 0  . azelastine (ASTELIN) 0.1 % nasal spray Place 2 sprays into both nostrils 3 (three) times daily. 30 mL 5  . buPROPion (WELLBUTRIN XL) 300 MG 24 hr tablet Take 1 tablet (300 mg total) by mouth every morning. 90 tablet 0  . escitalopram (LEXAPRO) 20 MG tablet Take 1 tablet (20 mg total) by mouth daily. 90 tablet 0  . loratadine (CLARITIN) 10 MG tablet Take 1 tablet (10 mg total) by mouth daily. 30 tablet  5  . LORazepam (ATIVAN) 0.5 MG tablet Take 0.5 mg by mouth every 8 (eight) hours.    Marland Kitchen omeprazole (PRILOSEC) 20 MG capsule Take 1 capsule (20 mg total) by mouth daily. 30 capsule 5  . ondansetron (ZOFRAN) 4 MG tablet Take 1 tablet (4 mg total) by mouth every 8 (eight) hours as needed for nausea or vomiting. 20 tablet 0  . Pramipexole Dihydrochloride (MIRAPEX ER) 3 MG TB24 Take 1 tablet (3 mg total) by mouth at bedtime. 90  tablet 0  . telmisartan-hydrochlorothiazide (MICARDIS HCT) 40-12.5 MG tablet Take 1 tablet by mouth daily. 90 tablet 0  . fluticasone (FLONASE) 50 MCG/ACT nasal spray Place 2 sprays into both nostrils daily. (Patient not taking: Reported on 08/24/2018) 16 g 5  . mirabegron ER (MYRBETRIQ) 50 MG TB24 tablet Take 50 mg by mouth daily.    . valACYclovir (VALTREX) 1000 MG tablet TAKE 2 TABLETS BY MOUTH EVERY 12 HOURS AS NEEDED FOR ONE DAY WITH OUTBREAK (Patient not taking: Reported on 08/24/2018) 20 tablet 0   No current facility-administered medications for this visit.     Allergies  Allergen Reactions  . Bee Venom Hives and Swelling  . Enablex [Darifenacin Hydrobromide] Other (See Comments)    Phlebitis.  . Erythromycin Hives  . Macrobid [Nitrofurantoin Monohyd Macro] Nausea And Vomiting  . Metronidazole     GI upset with tablet form  . Nitrofuran Derivatives Nausea And Vomiting  . Risperidone And Related Swelling    Social History   Socioeconomic History  . Marital status: Divorced    Spouse name: Not on file  . Number of children: Not on file  . Years of education: Not on file  . Highest education level: Not on file  Occupational History  . Not on file  Social Needs  . Financial resource strain: Not on file  . Food insecurity:    Worry: Not on file    Inability: Not on file  . Transportation needs:    Medical: Not on file    Non-medical: Not on file  Tobacco Use  . Smoking status: Never Smoker  . Smokeless tobacco: Never Used  Substance and Sexual Activity  . Alcohol use: Yes    Alcohol/week: 1.0 standard drinks    Types: 1 Cans of beer per week    Comment: per day  . Drug use: No  . Sexual activity: Not Currently    Partners: Male    Comment: 1ST intercourse- 18, partners-  2   Lifestyle  . Physical activity:    Days per week: Not on file    Minutes per session: Not on file  . Stress: Not on file  Relationships  . Social connections:    Talks on phone: Not on  file    Gets together: Not on file    Attends religious service: Not on file    Active member of club or organization: Not on file    Attends meetings of clubs or organizations: Not on file    Relationship status: Not on file  . Intimate partner violence:    Fear of current or ex partner: Not on file    Emotionally abused: Not on file    Physically abused: Not on file    Forced sexual activity: Not on file  Other Topics Concern  . Not on file  Social History Narrative  . Not on file    ROS CONSTITUTIONAL: no weight loss, fever EENT: sinus problems and nasal congestion, NO sore  throat, NO facial tenderness CV: no chest pain-chest tightness with exertion at times RESP: no SOB, dry cough, NO wheezing, NO sputum production GI: no constipation, diarrhea PSY: Idepression, anxiety  Objective   Vitals as reported by the patient:NONE Seasonal allergic rhinitis, unspecified trigger pt trial of flonase, zyrtec or xyzal and astelin-pt does not need refills-if worsening cough, fever or other concerns-pt to call or mychart for cxr -will order at facility of choice. D/w pt unable to complete cxr in office I discussed the assessment and treatment plan with the patient. The patient was provided an opportunity to ask questions and all were answered. The patient agreed with the plan and demonstrated an understanding of the instructions.   The patient was advised to call back or seek an in-person evaluation if the symptoms worsen or if the condition fails to improve as anticipated.  I provided 20 minutes of non-face-to-face time during this encounter.   Mat Carne, MD  Primary Care at [facility] 10-09-18

## 2018-10-08 NOTE — Patient Instructions (Signed)
° ° ° °  If you have lab work done today you will be contacted with your lab results within the next 2 weeks.  If you have not heard from us then please contact us. The fastest way to get your results is to register for My Chart. ° ° °IF you received an x-ray today, you will receive an invoice from [facility]. Please contact [facility] at [phone] with questions or concerns regarding your invoice.  ° °IF you received labwork today, you will receive an invoice from [facility]. Please contact [facility] at [phone] with questions or concerns regarding your invoice.  ° °Our billing staff will not be able to assist you with questions regarding bills from these companies. ° °You will be contacted with the lab results as soon as they are available. The fastest way to get your results is to activate your My Chart account. Instructions are located on the last page of this paperwork. If you have not heard from us regarding the results in 2 weeks, please contact this office. °  ° ° ° °

## 2018-10-09 DIAGNOSIS — J302 Other seasonal allergic rhinitis: Secondary | ICD-10-CM | POA: Insufficient documentation

## 2018-10-09 DIAGNOSIS — T7840XA Allergy, unspecified, initial encounter: Secondary | ICD-10-CM | POA: Insufficient documentation

## 2018-10-11 ENCOUNTER — Other Ambulatory Visit: Payer: Self-pay | Admitting: Physician Assistant

## 2018-10-11 ENCOUNTER — Other Ambulatory Visit: Payer: Self-pay | Admitting: Emergency Medicine

## 2018-10-14 ENCOUNTER — Other Ambulatory Visit: Payer: Self-pay

## 2018-10-14 ENCOUNTER — Ambulatory Visit (INDEPENDENT_AMBULATORY_CARE_PROVIDER_SITE_OTHER): Payer: 59 | Admitting: Psychiatry

## 2018-10-14 DIAGNOSIS — F331 Major depressive disorder, recurrent, moderate: Secondary | ICD-10-CM | POA: Diagnosis not present

## 2018-10-14 NOTE — Progress Notes (Signed)
[facility] Counselor/Therapist Progress Note  Patient ID: JAKAYA SCHEETZ, MRN: [id number],    Date: 10/14/2018  Time Spent:  60 minutes     12:00noon to 1:00pm   Treatment Type: Individual Therapy   Virtual Visit Note:  I connected with patient by a video enabled telemedicine application or telephone, with their informed consent, and verified patient privacy and that I am speaking with the correct person using two identifiers. I am at [facility] and patient is at her home.   I discussed the limitations, risks, security and privacy concerns of performing psychotherapy and management service by telephone and the availability of in person appointments. I also discussed with the patient that there may be a patient responsible charge related to this service. The patient expressed understanding and agreed to proceed.  I discussed the treatment planning with the patient. The patient was provided an opportunity to ask questions and all were answered. The patient agreed with the plan and demonstrated an understanding of the instructions.   The patient was advised to call  our office if  symptoms worsen or feel they are in a crisis state and need immediate contact.  Reported Symptoms:  depressed mood, anxiety, fear about COVID-19  Mental Status Exam:  Appearance:    N/A  (telehealth)  Behavior:  sharing  Motor:  Normal  Speech/Language:   Normal Rate  Affect:  N/A  (telehealth)  Mood:  anxiety and depressed mood   Thought process:  normal  Thought content:    WNL  Sensory/Perceptual disturbances:    WNL  Orientation:  oriented to person, place, time/date, situation, day of week, month of year and year  Attention:  Good  Concentration:  Good  Memory:  WNL  Fund of knowledge:   Good  Insight:    Fair  Judgment:   Fair  Impulse Control:  Fair   Risk Assessment: Danger to Self:  No Self-injurious Behavior: No Danger to Others: No Duty to Warn:no Physical Aggression /  Violence:No  Access to Firearms a concern: No  Gang Involvement:No    Subjective:  Patient reports symptoms noted above, adding that her anxiety has decreased a little and "I haven't snapped anybody's head off yet."  "Still some concern re: coronavirus but I'm getting better at dealing with it." The social distancing has been very difficult for her. No visiting other people due to coronavirus so that makes her feel more isolated and reports she does enjoy her dog. Working from home and misses being around the other people at office.  Continued depression and sadness re: her former mother-in-law who is in hospice care due to her cancer. With the virus, she doesn't go see her at this point.  More processing of her sadness about former mother-in-law and how it reminded of her mom and previous marriage.  Encouraged patient in practicing overall self-care (physical, emotional, nutritionally, spiritually), decrease overthinking, staying in contact via phone/text/etc with others, getting outside at least twice per day, walking.  Motivation continues to be lower more recently.  Discussed what helps and hurts her motivation level.  Also reports getting overwhelmed more easily. Reviewed again about different strategies, including "chunking" (splitting up)  her tasks to make them feel more do-able and less overwhelming,  as well as re-framing some of her ways of looking at things to see what she can control and what she cannot control. To continue decreasing her self-negating.   Interventions: Cognitive Behavioral Therapy and Ego-Supportive  Diagnosis:  ICD-10-CM   1. Major depressive disorder, recurrent episode, moderate (HCC) F33.1     Plan:  Goal review and progress noted with patient as she works to change her negative cognitions, especially as they feed her depression.  Patient to continue work on increasing positive thought patterns, practicing strategies we've discussed in sessions today.  To  continue practice of better communication at work and within family, continue decreasing  Self-negative talk, decrease the tendency to look at what may go wrong versus right, and ways to improve over-all self-care including physical exercise, and not over-personalizing at work and within the family.  Progress noted with patient and encouraged her to keep up her efforts. Next session in 1-2 wks.  Mathis Fareeborah Carthel Castille, LCSW

## 2018-10-23 ENCOUNTER — Ambulatory Visit (INDEPENDENT_AMBULATORY_CARE_PROVIDER_SITE_OTHER): Payer: 59 | Admitting: Psychiatry

## 2018-10-23 ENCOUNTER — Other Ambulatory Visit: Payer: Self-pay

## 2018-10-23 DIAGNOSIS — F331 Major depressive disorder, recurrent, moderate: Secondary | ICD-10-CM

## 2018-10-23 NOTE — Progress Notes (Signed)
[facility] Counselor/Therapist Progress Note  Patient ID: Helen Greene, MRN: [id number],    Date: 10/23/2018  Time Spent:  60 minutes     12:00noon to 1:00pm   Treatment Type: Individual Therapy   Virtual Visit Note:  I connected with patient by a video enabled telemedicine application or telephone, with their informed consent, and verified patient privacy and that I am speaking with the correct person using two identifiers. [facility] and patient is at her home.   I discussed the limitations, risks, security and privacy concerns of performing psychotherapy and management service by telephone and the availability of in person appointments. I also discussed with the patient that there may be a patient responsible charge related to this service. The patient expressed understanding and agreed to proceed.  I discussed the treatment planning with the patient. The patient was provided an opportunity to ask questions and all were answered. The patient agreed with the plan and demonstrated an understanding of the instructions.   The patient was advised to call  our office if  symptoms worsen or feel they are in a crisis state and need immediate contact.  Reported Symptoms:  depressed mood, anxiety, fear about COVID-19  Mental Status Exam:  Appearance:    N/A  (telehealth)  Behavior:  sharing  Motor:  Normal  Speech/Language:   Normal Rate  Affect:  N/A  (telehealth)  Mood:  anxiety and depressed mood   Thought process:  normal  Thought content:    WNL  Sensory/Perceptual disturbances:    WNL  Orientation:  oriented to person, place, time/date, situation, day of week, month of year and year  Attention:  Good  Concentration:  Good  Memory:  WNL  Fund of knowledge:   Good  Insight:    Fair  Judgment:   Fair  Impulse Control:  Fair   Risk Assessment: Danger to Self:  No Self-injurious Behavior: No Danger to Others: No Duty to Warn:no Physical Aggression /  Violence:No  Access to Firearms a concern: No  Gang Involvement:No    Subjective:  Patient reports symptoms of anxiety and depression.  Patient still has some concern re: coronavirus and feels she is getting better at dealing with it.  The social distancing has been very difficult for her. Working from home and misses being around the other people at office.  Patient reports she is handling her sadness better re: her former mother-in-law who is in hospice care due to her cancer. With the virus, she doesn't go see her at this point.     Processes the sadness she has felt and continues to feel, being so separated and loosely connected to her 49 yr old son who lives with his father and is a high school graduate this year.  Is planning to help sponsor a recognition for her son and it brings up sadness since they are not close at all. Tearful as she discusses this and how she feels rejected. Helped her look at situation a bit differently and without rejection.    Encouraged patient in practicing overall self-care (physical, emotional, nutritionally, spiritually), decrease overthinking, staying in contact via phone/text/etc with others, getting outside at least twice per day, walking. more recently.   Also reports getting overwhelmed more easily. Reviewed again about different strategies, including "chunking" (splitting up)  her tasks to make them feel more do-able and less overwhelming,  as well as re-framing some of her ways of looking at things  to see what she can control and what she cannot control.  Seemed to feel better after venting and processing, especially re: her 91 yr old son.    Interventions: Cognitive Behavioral Therapy and Ego-Supportive  Diagnosis:   ICD-10-CM   1. Major depressive disorder, recurrent episode, moderate (HCC) F33.1     Plan:  Goal review and progress noted with patient as she works to change her negative cognitions, especially as they feed her depression.  Patient to  continue work on increasing positive thought patterns, practicing strategies we've discussed in sessions today.  To continue practice of better communication at work and within family, continue decreasing  Self-negative talk, decrease the tendency to look at what may go wrong versus right, and ways to improve over-all self-care including physical exercise, and not over-personalizing at work and within the family.  Progress noted with patient and encouraged her to keep up her efforts. Next session in 1-2 wks.  Mathis Fare, LCSW

## 2018-10-26 ENCOUNTER — Other Ambulatory Visit: Payer: Self-pay | Admitting: Physician Assistant

## 2018-10-26 ENCOUNTER — Telehealth: Payer: Self-pay | Admitting: Physician Assistant

## 2018-10-26 MED ORDER — ARIPIPRAZOLE 2 MG PO TABS: 2.0000 mg | ORAL_TABLET | Freq: Every morning | ORAL | 0 refills | Status: DC

## 2018-10-26 MED ORDER — BUPROPION HCL ER (XL) 300 MG PO TB24: 300.0000 mg | ORAL_TABLET | ORAL | 0 refills | Status: DC

## 2018-10-26 NOTE — Telephone Encounter (Signed)
Rx sent 

## 2018-10-26 NOTE — Telephone Encounter (Signed)
Patient left vm today @10 :23 a little confused about mail order script, need refill on Wellbutrin and Ability to be sent to the [facility] on [address]

## 2018-10-27 ENCOUNTER — Institutional Professional Consult (permissible substitution): Payer: Self-pay | Admitting: Neurology

## 2018-10-29 ENCOUNTER — Ambulatory Visit (INDEPENDENT_AMBULATORY_CARE_PROVIDER_SITE_OTHER): Payer: 59 | Admitting: Psychiatry

## 2018-10-29 ENCOUNTER — Other Ambulatory Visit: Payer: Self-pay

## 2018-10-29 DIAGNOSIS — F331 Major depressive disorder, recurrent, moderate: Secondary | ICD-10-CM

## 2018-10-29 NOTE — Progress Notes (Signed)
[facility] Counselor/Therapist Progress Note  Patient ID: Jasmine AweLiesa L Mackley, MRN: [id number],    Date: 10/29/2018  Time Spent:  60 minutes     12:00noon to 1:00pm   Treatment Type: Individual Therapy   Virtual Visit Note:  I connected with patient by a video enabled telemedicine application or telephone, with their informed consent, and verified patient privacy and that I am speaking with the correct person using two identifiers. [facility] and patient is at her home.   I discussed the limitations, risks, security and privacy concerns of performing psychotherapy and management service by telephone and the availability of in person appointments. I also discussed with the patient that there may be a patient responsible charge related to this service. The patient expressed understanding and agreed to proceed.  I discussed the treatment planning with the patient. The patient was provided an opportunity to ask questions and all were answered. The patient agreed with the plan and demonstrated an understanding of the instructions.   The patient was advised to call  our office if  symptoms worsen or feel they are in a crisis state and need immediate contact.  Reported Symptoms:  depressed mood, anxiety, fear about COVID-19, recent  feelings emerging from past issue with older brother  Mental Status Exam:  Appearance:    N/A  (telehealth)  Behavior:  sharing  Motor:  N/A     (telehealth)  Speech/Language:   Normal Rate  Affect:  N/A  (telehealth)  Mood:  anxiety and depressed mood,   Thought process:  normal  Thought content:    WNL  Sensory/Perceptual disturbances:    WNL  Orientation:  oriented to person, place, time/date, situation, day of week, month of year and year  Attention:  Good  Concentration:  Good  Memory:  WNL  Fund of knowledge:   Good  Insight:    Fair  Judgment:   Fair  Impulse Control:  Fair   Risk Assessment: Danger to Self:   No Self-injurious Behavior: No Danger to Others: No Duty to Warn:no Physical Aggression / Violence:No  Access to Firearms a concern: No  Gang Involvement:No    Subjective:  Patient reports symptoms of anxiety and depression, with both increasing some past week.  Patient managing her feelings better about coronavirus situation. The social distancing has been very difficult for her. Working from home and misses being around the other people at office.  Patient reports she is continuing  her sadness re: her former mother-in-law who is in hospice care due to her cancer. Patient reports mother-in-law is actually staying stable right now. With the virus, she doesn't go see her at this point.    Chooses to talk some about issues with her older brother over the years due to some interactions that were inappropriate. Limited info here in chart due to patient's concern for confidentiality. She was able to explain the circumstances and the way it has affected her relationship with brother over the years.  Does want to work further on this next session in efforts to feel better and have more closure in order to move forward "without this on my back".    Did have contact with her 8454yr old son recently about his graduating from high school this year and some family plans to recognize that.  She felt positive about her contact with him, "as it doesn't happen very often".  (he lives with his dad)  Encouraged patient in practicing overall  self-care (physical, emotional, nutritionally, spiritually), decrease overthinking, staying in contact via phone/text/etc with others, getting outside at least twice per day, walking. more recently.   Also reports getting overwhelmed more easily. Reviewed again about different strategies, including "chunking" (splitting up)  her tasks to make them feel more do-able and less overwhelming,  as well as re-framing some of her ways of looking at things to see what she can control and  what she cannot control.   Interventions: Cognitive Behavioral Therapy and Ego-Supportive  Diagnosis:   ICD-10-CM   1. Major depressive disorder, recurrent episode, moderate (HCC) F33.1     Plan:  Goal review and progress noted with patient as she works to change her negative cognitions, especially as they feed her depression.  Patient to continue work on increasing positive thought patterns, practicing strategies we've discussed in sessions today.  To continue practice of better communication at work and within family, continue decreasing  Self-negative talk, decrease the tendency to look at what may go wrong versus right, and ways to improve over-all self-care including physical exercise, and not over-personalizing at work and within the family.  Progress noted with patient and encouraged her to keep up her efforts. Next session in 1-2 wks.  Mathis Fare, LCSW

## 2018-11-04 ENCOUNTER — Ambulatory Visit (INDEPENDENT_AMBULATORY_CARE_PROVIDER_SITE_OTHER): Payer: 59 | Admitting: Psychiatry

## 2018-11-04 ENCOUNTER — Other Ambulatory Visit: Payer: Self-pay

## 2018-11-04 DIAGNOSIS — F331 Major depressive disorder, recurrent, moderate: Secondary | ICD-10-CM

## 2018-11-04 NOTE — Progress Notes (Signed)
[facility] Counselor/Therapist Progress Note  Patient ID: NALAYA PETTAWAY, MRN: [id number],    Date: 11/04/2018  Time Spent:  60 minutes     11:00am to 12:00noon  Treatment Type: Individual Therapy   Virtual Visit Note:  I connected with patient by a video enabled telemedicine application or telephone, with their informed consent, and verified patient privacy and that I am speaking with the correct person using two identifiers. I am at [facility] and patient is at her home.   I discussed the limitations, risks, security and privacy concerns of performing psychotherapy and management service by telephone and the availability of in person appointments. I also discussed with the patient that there may be a patient responsible charge related to this service. The patient expressed understanding and agreed to proceed.  I discussed the treatment planning with the patient. The patient was provided an opportunity to ask questions and all were answered. The patient agreed with the plan and demonstrated an understanding of the instructions.   The patient was advised to call  our office if  symptoms worsen or feel they are in a crisis state and need immediate contact.  Reported Symptoms:  depressed mood, anxiety, fear about COVID-19, recent  feelings emerging from past issue with older brother  Mental Status Exam:  Appearance:    N/A  (telehealth)  Behavior:  sharing  Motor:  N/A     (telehealth)  Speech/Language:   Normal Rate  Affect:  N/A  (telehealth)  Mood:  anxiety and depressed mood,   Thought process:  normal  Thought content:    WNL  Sensory/Perceptual disturbances:    WNL  Orientation:  oriented to person, place, time/date, situation, day of week, month of year and year  Attention:  Good  Concentration:  Good  Memory:  WNL  Fund of knowledge:   Good  Insight:    Fair  Judgment:   Fair  Impulse Control:  Fair   Risk Assessment: Danger to Self:   No Self-injurious Behavior: No Danger to Others: No Duty to Warn:no Physical Aggression / Violence:No  Access to Firearms a concern: No  Gang Involvement:No    Subjective:  Saw 22 yrs old son this Sunday and had a good time walking at the [employer] and took pictures with son (cap and gown for graduation). Sad that she doesn't see him much in person(he lives with his dad and does lots of "guy things" with him), but also very happy she got to spend a brief time with him Sunday.    Patient feels she is doing "some better for now considering the circumstances we are in with the pandemic."  She does still experience anxiety and depression and feels that is related to both being in pandemic, work situations, family concerns, and social isolation. Reports that her anxiety and depression have stayed about the same except "the anxiety has increased some". Also "the upcoming graduation party with family is stressing me due to all the dynamics involved in our relationships."  Wants to talk more about this is at another session, as she would like to have some closure on some family issues, including older brother that we processed some last session and the wife of her former husband.   Encouraged patient in practicing overall self-care (physical, emotional, nutritionally, spiritually), decrease overthinking, staying in contact via phone/text/etc with others, getting outside at least twice per day, walking. more recently.   Also reports getting overwhelmed more easily. Reviewed  again about different strategies, including "chunking" (splitting up)  her tasks to make them feel more do-able and less overwhelming,  as well as re-framing some of her ways of looking at things to see what she can control and what she cannot control.   Interventions: Cognitive Behavioral Therapy and Ego-Supportive  Diagnosis:   ICD-10-CM   1. Major depressive disorder, recurrent episode, moderate (HCC) F33.1     Plan:  Goal  review and progress noted with patient as she works to change her negative cognitions, especially as they feed her depression.  Patient to continue work on increasing positive thought patterns, practicing strategies we've discussed in sessions today.  To continue practice of better communication at work and within family, continue decreasing  Self-negative talk, decrease the tendency to look at what may go wrong versus right, and ways to improve over-all self-care including physical exercise, and not over-personalizing at work and within the family.  Progress noted with patient and encouraged her to keep up her efforts. Next session in 1-2 wks.  Mathis Fareeborah Arrabella Westerman, LCSW

## 2018-11-12 ENCOUNTER — Ambulatory Visit: Payer: 59 | Admitting: Psychiatry

## 2018-11-12 ENCOUNTER — Other Ambulatory Visit: Payer: Self-pay

## 2018-11-18 ENCOUNTER — Other Ambulatory Visit: Payer: Self-pay

## 2018-11-18 ENCOUNTER — Ambulatory Visit (INDEPENDENT_AMBULATORY_CARE_PROVIDER_SITE_OTHER): Payer: 59 | Admitting: Psychiatry

## 2018-11-18 DIAGNOSIS — F331 Major depressive disorder, recurrent, moderate: Secondary | ICD-10-CM | POA: Diagnosis not present

## 2018-11-18 NOTE — Progress Notes (Signed)
      [facility] Counselor/Therapist Progress Note  Patient ID: Helen Greene, MRN: [id number],    Date: 11/18/2018  Time Spent:  60 minutes      4:00pm to 5:00pm  Treatment Type: Individual Therapy   Reported Symptoms:  depressed mood, anxiety, fear about COVID-19, recent  feelings emerging from past issue with older brother  Mental Status Exam:  Appearance:   casual  Behavior:  sharing  Motor:  normal  Speech/Language:   Normal Rate  Affect:  Anxious, sadness at violence throughout country  Mood:  anxiety and depressed mood,   Thought process:  normal  Thought content:    WNL  Sensory/Perceptual disturbances:    WNL  Orientation:  oriented to person, place, time/date, situation, day of week, month of year and year  Attention:  Good  Concentration:  Good  Memory:  WNL  Fund of knowledge:   Good  Insight:    Fair  Judgment:   Fair  Impulse Control:  Fair   Risk Assessment: Danger to Self:  No Self-injurious Behavior: No Danger to Others: No Duty to Warn:no Physical Aggression / Violence:No  Access to Firearms a concern: No  Gang Involvement:No    Subjective:  Patient in today experiencing anxiety primarily, although some depressed mood.  Vented feelings about the recent racial tensions and violence throughout the country, and the continued pandemic. Talked about her feelings and reactions to the racial tensions, protesting, and inequalities.    Excited that her youngest son is graduating soon from [employer]. Looking forward to their party for her son and the "drive-through" graduation (due to pandemic).  States she may be at home working til early September at least.  Is isolating but she likes it ok. Expresses concern about her weight and has actually wondered about bariatric surgery.  Frustrated that she finds it hard to stick with healthier eating patterns or exercising. Looked at different strategies as well as her obstacles to weight loss. She may speak with her doctor  about it which I encourage due to some "pains and health concerns she reports that has limited her ability to exercise".  Encouraged patient in practicing overall self-care (physical, emotional, nutritionally, spiritually), decrease overthinking and self-critical talk,staying in contact via phone/text/etc with others, getting outside at least twice per day, and walking (unless she has leg pain).   Reviewed again about different strategies, including "chunking" (splitting up)  her tasks to make them feel more do-able and less overwhelming, as well as re-framing some of her ways of looking at things to see what she can control and what she cannot control.   Interventions: Cognitive Behavioral Therapy and Ego-Supportive  Diagnosis:   ICD-10-CM   1. Major depressive disorder, recurrent episode, moderate (HCC) F33.1     Plan:  Goal review and progress noted with patient as she works to change her negative cognitions, especially as they feed her anxiety and depression.  Patient to continue work on increasing positive thought patterns, practicing strategies we've discussed in sessions today.  To continue practice of better communication at work and within family, continue decreasing  Self-negative talk, decrease the tendency to look at what may go wrong versus right, and ways to improve over-all self-care including physical exercise, and not over-personalizing at work and within the family.  Progress noted with patient and encouraged her to keep up her efforts. Next session in 1-2 wks.  Mathis Fare, LCSW

## 2018-11-24 ENCOUNTER — Other Ambulatory Visit: Payer: Self-pay

## 2018-11-24 ENCOUNTER — Ambulatory Visit (INDEPENDENT_AMBULATORY_CARE_PROVIDER_SITE_OTHER): Payer: 59 | Admitting: Physician Assistant

## 2018-11-24 ENCOUNTER — Ambulatory Visit (INDEPENDENT_AMBULATORY_CARE_PROVIDER_SITE_OTHER): Payer: 59 | Admitting: Psychiatry

## 2018-11-24 ENCOUNTER — Encounter: Payer: Self-pay | Admitting: Physician Assistant

## 2018-11-24 DIAGNOSIS — G2581 Restless legs syndrome: Secondary | ICD-10-CM

## 2018-11-24 DIAGNOSIS — F331 Major depressive disorder, recurrent, moderate: Secondary | ICD-10-CM

## 2018-11-24 DIAGNOSIS — F411 Generalized anxiety disorder: Secondary | ICD-10-CM | POA: Diagnosis not present

## 2018-11-24 MED ORDER — LORAZEPAM 0.5 MG PO TABS: 0.5000 mg | ORAL_TABLET | Freq: Three times a day (TID) | ORAL | 1 refills | Status: DC | PRN

## 2018-11-24 MED ORDER — PRAMIPEXOLE DIHYDROCHLORIDE 0.25 MG PO TABS: ORAL_TABLET | ORAL | 1 refills | Status: DC

## 2018-11-24 MED ORDER — ESCITALOPRAM OXALATE 20 MG PO TABS: 20.0000 mg | ORAL_TABLET | Freq: Every day | ORAL | 1 refills | Status: DC

## 2018-11-24 MED ORDER — BUPROPION HCL ER (XL) 300 MG PO TB24: 300.0000 mg | ORAL_TABLET | ORAL | 1 refills | Status: DC
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
## 2018-11-24 NOTE — Progress Notes (Signed)
[facility] Med Check  Patient ID: Helen Greene,  MRN: [id number]  PCP: Doristine BosworthStallings, Zoe A, MD  Date of Evaluation: 11/24/2018 Time spent:15 minutes  Chief Complaint:  Chief Complaint    Follow-up      HISTORY/CURRENT STATUS: HPI For 3 month med check.   Patient denies loss of interest in usual activities and is able to enjoy things.  Denies decreased energy or motivation.  Appetite has not changed.  No extreme sadness, tearfulness, or feelings of hopelessness.  Denies any changes in concentration, making decisions or remembering things.  Denies suicidal or homicidal thoughts.  She ran out of the pramipexole and it was too expensive to renew.  She has been at her deductible now would like to restart the pramipexole.  Not really sure if her legs are jerking while she sleeps or not but she does not sleep well and wakes up tired the next day.  She takes a 40-minute nap at lunch some days which helps.  Her sleep study was postponed due to the coronavirus pandemic.  Anxiety is much better.  She is not needing the Ativan very often.  It does help when she needs it.  Denies dizziness, syncope, seizures, numbness, tingling, tremor, tics, unsteady gait, slurred speech, confusion. Denies muscle or joint pain, stiffness, or dystonia.  Individual Medical History/ Review of Systems: Changes? :No    Past medications for mental health diagnoses include: Wellbutrin XL, Risperdal caused edema, Lexapro, Mirapex, Vistaril, questionably Xanax, Abilify, Lamictal, Klonopin, pramipexole for restless legs  Allergies: Bee venom; Enablex [darifenacin hydrobromide]; Erythromycin; Macrobid [nitrofurantoin monohyd macro]; Metronidazole; Nitrofuran derivatives; and Risperidone and related  Current Medications:  Current Outpatient Medications:  .  ARIPiprazole (ABILIFY) 2 MG tablet, Take 1 tablet (2 mg total) by mouth every morning., Disp: 90 tablet, Rfl: 0 .  azelastine (ASTELIN) 0.1 % nasal spray, Place 2  sprays into both nostrils 3 (three) times daily., Disp: 30 mL, Rfl: 5 .  buPROPion (WELLBUTRIN XL) 300 MG 24 hr tablet, Take 1 tablet (300 mg total) by mouth every morning., Disp: 90 tablet, Rfl: 1 .  escitalopram (LEXAPRO) 20 MG tablet, Take 1 tablet (20 mg total) by mouth daily., Disp: 90 tablet, Rfl: 1 .  levocetirizine (XYZAL) 5 MG tablet, Take 5 mg by mouth every evening., Disp: , Rfl:  .  LORazepam (ATIVAN) 0.5 MG tablet, Take 1 tablet (0.5 mg total) by mouth every 8 (eight) hours as needed for anxiety., Disp: 90 tablet, Rfl: 1 .  omeprazole (PRILOSEC) 20 MG capsule, Take 1 capsule (20 mg total) by mouth daily., Disp: 30 capsule, Rfl: 5 .  ondansetron (ZOFRAN) 4 MG tablet, Take 1 tablet (4 mg total) by mouth every 8 (eight) hours as needed for nausea or vomiting., Disp: 20 tablet, Rfl: 0 .  telmisartan-hydrochlorothiazide (MICARDIS HCT) 40-12.5 MG tablet, TAKE 1 TABLET DAILY, Disp: 90 tablet, Rfl: 0 .  valACYclovir (VALTREX) 1000 MG tablet, TAKE 2 TABLETS BY MOUTH EVERY 12 HOURS AS NEEDED FOR ONE DAY WITH OUTBREAK, Disp: 20 tablet, Rfl: 0 .  fluticasone (FLONASE) 50 MCG/ACT nasal spray, Place 2 sprays into both nostrils daily. (Patient not taking: Reported on 08/24/2018), Disp: 16 g, Rfl: 5 .  loratadine (CLARITIN) 10 MG tablet, Take 1 tablet (10 mg total) by mouth daily. (Patient not taking: Reported on 11/24/2018), Disp: 30 tablet, Rfl: 5 .  mirabegron ER (MYRBETRIQ) 50 MG TB24 tablet, Take 50 mg by mouth daily., Disp: , Rfl:  .  pramipexole (MIRAPEX) 0.25 MG tablet, 1  po qhs for 4 nights, then may increase to 2 qhs., Disp: 60 tablet, Rfl: 1 Medication Side Effects: none  Family Medical/ Social History: Changes? Yes working from home.   MENTAL HEALTH EXAM:  Last menstrual period 09/07/2015.There is no height or weight on file to calculate BMI.  General Appearance: Casual and Obese  Eye Contact:  Good  Speech:  Clear and Coherent  Volume:  Normal  Mood:  Euthymic  Affect:  Appropriate   Thought Process:  Goal Directed  Orientation:  Full (Time, Place, and Person)  Thought Content: Logical   Suicidal Thoughts:  No  Homicidal Thoughts:  No  Memory:  WNL  Judgement:  Good  Insight:  Good  Psychomotor Activity:  Normal  Concentration:  Concentration: Good  Recall:  Good  Fund of Knowledge: Good  Language: Good  Assets:  Desire for Improvement  ADL's:  Intact  Cognition: WNL  Prognosis:  Good    DIAGNOSES:    ICD-10-CM   1. Major depressive disorder, recurrent episode, moderate (HCC) F33.1   2. Restless leg syndrome G25.81   3. Generalized anxiety disorder F41.1     Receiving Psychotherapy: Yes with Rinaldo Cloud, LCSW   RECOMMENDATIONS:  Restart pramipexole 0.25 mg, 1 nightly for 4 days and then may increase to 2 pills nightly.  I do not want to start at 3 mg which is her last dose.  She understands.  She can call prior to the next visit and we can increase dose as needed. Continue Abilify 2 mg every morning. Continue Wellbutrin XL 300 mg 1 p.o. every morning. Continue Lexapro 20 mg daily. Continue Ativan 0.5 mg every 8 hours as needed anxiety. Continue psychotherapy with Rinaldo Cloud, LCSW. Return in 3 months.  Donnal Moat, PA-C   This record has been created using [employer].  Chart creation errors have been sought, but may not always have been located and corrected. Such creation errors do not reflect on the standard of medical care.

## 2018-12-07 ENCOUNTER — Other Ambulatory Visit: Payer: Self-pay

## 2018-12-07 ENCOUNTER — Ambulatory Visit (INDEPENDENT_AMBULATORY_CARE_PROVIDER_SITE_OTHER): Payer: 59 | Admitting: Psychiatry

## 2018-12-07 DIAGNOSIS — F331 Major depressive disorder, recurrent, moderate: Secondary | ICD-10-CM
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
## 2018-12-14 ENCOUNTER — Ambulatory Visit (INDEPENDENT_AMBULATORY_CARE_PROVIDER_SITE_OTHER): Payer: 59 | Admitting: Psychiatry

## 2018-12-14 ENCOUNTER — Other Ambulatory Visit: Payer: Self-pay

## 2018-12-14 DIAGNOSIS — F331 Major depressive disorder, recurrent, moderate: Secondary | ICD-10-CM
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
## 2018-12-22 ENCOUNTER — Ambulatory Visit: Payer: 59 | Admitting: Psychiatry

## 2018-12-29 ENCOUNTER — Ambulatory Visit: Payer: 59 | Admitting: Psychiatry

## 2019-01-05 ENCOUNTER — Ambulatory Visit (INDEPENDENT_AMBULATORY_CARE_PROVIDER_SITE_OTHER): Payer: 59 | Admitting: Psychiatry

## 2019-01-05 ENCOUNTER — Other Ambulatory Visit: Payer: Self-pay

## 2019-01-05 DIAGNOSIS — F331 Major depressive disorder, recurrent, moderate: Secondary | ICD-10-CM

## 2019-01-05 NOTE — Progress Notes (Addendum)
      [facility] Counselor/Therapist Progress Note  Patient ID: Helen Greene, MRN: [id number],    Date: 01/05/2019  Time Spent:  60 minutes      11:00am to 12:00noon  Treatment Type: Individual Therapy   Reported Symptoms:  Depression,  anxiety  Mental Status Exam:  Appearance:   casual  Behavior:  sharing  Motor:  normal  Speech/Language:   Normal Rate  Affect:  Anxious, depressed "but anxiety is worse than depression"  Mood:  Anxious  Thought process:  normal  Thought content:    WNL  Sensory/Perceptual disturbances:    WNL  Orientation:  oriented to person, place, time/date, situation, day of week, month of year and year  Attention:  Good  Concentration:  Good  Memory:  WNL  Fund of knowledge:   Good  Insight:    Good  (improved)  Judgment:   Good  (improved)  Impulse Control:  Good  (improved)   Risk Assessment: Danger to Self:  No Self-injurious Behavior: No Danger to Others: No Duty to Warn:no Physical Aggression / Violence:No  Access to Firearms a concern: No  Gang Involvement:No    Subjective: Patient in today with symptoms listed above, with decreased depression, and some increased anxiety and stress.  Picking up from last appt, she denies any further suspicions and feels good about that.  have better understanding how her negative self-talk trips her up and contributes to her depression, and especially her anxiety and stress.  Discussed her sefl-talk in work environment, at home, and just to herself.  Anxiety is reportedly related to "feeling guilt, trying to please others, "the unknowns and uncertainties", and pressure patient puts on herself.  Is working to change these things and it is starting to show some in her behavior in positive ways..Her supervisor is encouraging of patient's work and progress.  * (Review with patient): Encouraged patient in practicing good overall self-care (physical, emotional, spiritual, and nutritionally,Patient is more  noticeably working to be more positive. spiritually), decrease overthinking and self-critical talk,staying in contact via phone/text/etc with others, getting outside at least twice per day, and walking as able. To keep using re-framing as a strategy with her thought patterns and  looking at things to see what she can control and what she cannot control.   Interventions: Cognitive Behavioral Therapy and Ego-Supportive  Diagnosis:   ICD-10-CM   1. Major depressive disorder, recurrent episode, moderate (HCC)  F33.1     Plan:  Goal review and progress noted.  Patient continues work on increasing positive thought patterns, practicing strategies we've discussed previously and in session today.  To continue practice of better communication at work and within family, continue decreasing self-negative talk, decrease the tendency to look at what may go wrong versus right, and ways to improve over-all self-care including physical exercise, and not over-personalizing at work and within the family.  Progress noted with patient and encouraged her to keep up her efforts. Next session in 2 wks.  Helen Ace, LCSW

## 2019-01-09 ENCOUNTER — Other Ambulatory Visit: Payer: Self-pay | Admitting: Emergency Medicine

## 2019-01-12 ENCOUNTER — Ambulatory Visit (INDEPENDENT_AMBULATORY_CARE_PROVIDER_SITE_OTHER): Payer: 59 | Admitting: Psychiatry

## 2019-01-12 ENCOUNTER — Other Ambulatory Visit: Payer: Self-pay

## 2019-01-12 DIAGNOSIS — F331 Major depressive disorder, recurrent, moderate: Secondary | ICD-10-CM

## 2019-01-12 NOTE — Progress Notes (Signed)
      [facility] Counselor/Therapist Progress Note  Patient ID: Helen Greene, MRN: [id number],    Date: 01/12/2019  Time Spent:  60 minutes      11:00am to 12:00noon  Treatment Type: Individual Therapy   Reported Symptoms:  Depression,  Anxiety, some increased loneliness and wanting to consider a relationship  Mental Status Exam:  Appearance:   casual  Behavior:  sharing  Motor:  normal  Speech/Language:   Normal Rate  Affect:  Anxious, depressed "but anxiety is worse than depression"  Mood:  Anxious, some sadness re: loneliness  Thought process:  normal  Thought content:    WNL  Sensory/Perceptual disturbances:    WNL  Orientation:  oriented to person, place, time/date, situation, day of week, month of year and year  Attention:  Good  Concentration:  Good  Memory:  WNL  Fund of knowledge:   Good  Insight:    Good  (improved)  Judgment:   Good  (improved)  Impulse Control:  Good  (improved)   Risk Assessment: Danger to Self:  No Self-injurious Behavior: No Danger to Others: No Duty to Warn:no Physical Aggression / Violence:No  Access to Firearms a concern: No  Gang Involvement:No    Subjective: Patient in today with symptoms listed above, with some increased loneliness more recently that she says it is not really because of the social distancing but rather because she has "just lately realized she wished she could meet someone and have a relationship."  Recognizes that her negative self-talk trips her up and contributes to her depression, and especially her anxiety and stress.  Reflected on her self-talk in work environment, at home, and just to herself, and especially with her recent loneliness and desire for relationship.  Negative self-talk is not helping.  Processed more of her tendency towards negative self-talk and her anxiety which patient says is sometimes related to "feeling guilt, trying to please others, "the unknowns and uncertainties", and pressure patient  puts on herself. Has been working to stop her negative self-talk and better manage her anxiety and has been able to sense some progress. Added at end of session that she "really misses church being open and may check this week to see where there are some churches with outdoor services and healthy social distancing.   * (Review with patient): Encouraged patient in practicing good overall self-care (physical, emotional, spiritual, and nutritionally,Patient is more noticeably working to be more positive. spiritually), decrease overthinking and self-critical talk,staying in contact via phone/text/etc with others, getting outside at least twice per day, and walking as able. To keep using re-framing as a strategy with her thought patterns and  looking at things to see what she can control and what she cannot control.   Interventions: Cognitive Behavioral Therapy and Ego-Supportive  Diagnosis:   ICD-10-CM   1. Major depressive disorder, recurrent episode, moderate (HCC)  F33.1     Plan:  Goal review and progress noted.  Patient continues work on increasing positive thought patterns, practicing strategies we've discussed previously and in session today.  To continue practice of better communication at work and within family, continue decreasing self-negative talk, decrease the tendency to look at what may go wrong versus right, and ways to improve over-all self-care including physical exercise, and not over-personalizing at work and within the family.  Goal review (through Flowsheets in chart) and progress noted with patient and encouraged her to keep up her efforts. Next session in 2 wks.  Shanon Ace, LCSW

## 2019-01-19 ENCOUNTER — Ambulatory Visit (INDEPENDENT_AMBULATORY_CARE_PROVIDER_SITE_OTHER): Payer: 59 | Admitting: Psychiatry

## 2019-01-19 ENCOUNTER — Other Ambulatory Visit: Payer: Self-pay

## 2019-01-19 DIAGNOSIS — F331 Major depressive disorder, recurrent, moderate: Secondary | ICD-10-CM | POA: Diagnosis not present

## 2019-01-19 NOTE — Progress Notes (Signed)
[facility] Counselor/Therapist Progress Note  Patient ID: Helen Greene, MRN: [id number],    Date: 01/19/2019  Time Spent:  60 minutes      4:00pm to 5:00pm  Treatment Type: Individual Therapy   Reported Symptoms:  Depression,  Anxiety, some increased loneliness and wanting to consider a relationship  Mental Status Exam:  Appearance:   casual  Behavior:  sharing  Motor:  normal  Speech/Language:   Normal Rate  Affect:  Anxious, depressed "but anxiety is worse than depression"  Mood:  Anxious, some sadness re: loneliness  Thought process:  normal  Thought content:    WNL  Sensory/Perceptual disturbances:    WNL  Orientation:  oriented to person, place, time/date, situation, day of week, month of year and year  Attention:  Good  Concentration:  Good  Memory:  WNL  Fund of knowledge:   Good  Insight:    Good    Judgment:   Good  Impulse Control:  Good     Risk Assessment: Danger to Self:  No Self-injurious Behavior: No Danger to Others: No Duty to Warn:no Physical Aggression / Violence:No  Access to Firearms a concern: No  Gang Involvement:No    Subjective: Patient in today with symptoms listed above, and reports that loneliness has been one of her bigger issues.  Wanting to consider "companionship maybe relationship, and also maybe make more friends.  Did catch up with a girlfriend recently and that friend seems to be a healthy friend for patient. Social distancing has made being with friends more difficult.    Discussed her anxiety and depression and she feels that she has had some decreased in both. Is showing some strength and resilience in working on her symptoms, which can help patient feel more empowered. Is better at recognizing triggers and also how her own self-talk contributes to her depression and anxiety/stress.  Reflected more on her self-talk in work environment, at home, and just to herself, and especially with her recent loneliness and desire for  relationship.  Negative self-talk is not helping.  Processed more of her tendency towards negative self-talk and her anxiety which patient says is sometimes related to "feeling guilt, trying to please others, "the unknowns and uncertainties", and pressure patient puts on herself. Has been working to stop her negative self-talk and better manage her anxiety and has been able to sense some progress.   * (Review with patient): Encouraged patient in practicing good overall self-care (physical, emotional, spiritual, and nutritionally,Patient is more noticeably working to be more positive. spiritually), decrease overthinking and self-critical talk,staying in contact via phone/text/etc with others, getting outside at least twice per day, and walking as able. To keep using re-framing as a strategy with her thought patterns and  looking at things to see what she can control and what she cannot control.   Interventions: Cognitive Behavioral Therapy and Ego-Supportive  Diagnosis:   ICD-10-CM   1. Major depressive disorder, recurrent episode, moderate (HCC)  F33.1     Plan:  Goal review and progress noted.  Patient continues work on increasing positive thought patterns, practicing strategies we've discussed previously and in session today.  To continue practice of better communication at work and within family, continue decreasing self-negative talk, decrease the tendency to look at what may go wrong versus right, and ways to improve over-all self-care including physical exercise, and not over-personalizing at work and within the family.  Goal review (through Flowsheets in chart) and progress noted with patient  and encouraged her to keep up her efforts. Next session in 2 wks.  Mathis Fareeborah Zania Kalisz, LCSW

## 2019-01-27 ENCOUNTER — Other Ambulatory Visit: Payer: Self-pay | Admitting: Emergency Medicine

## 2019-01-28 ENCOUNTER — Other Ambulatory Visit: Payer: Self-pay

## 2019-01-29 ENCOUNTER — Encounter: Payer: 59 | Admitting: Obstetrics & Gynecology

## 2019-02-02 ENCOUNTER — Other Ambulatory Visit: Payer: Self-pay

## 2019-02-02 ENCOUNTER — Ambulatory Visit (INDEPENDENT_AMBULATORY_CARE_PROVIDER_SITE_OTHER): Payer: 59 | Admitting: Psychiatry

## 2019-02-02 DIAGNOSIS — F411 Generalized anxiety disorder: Secondary | ICD-10-CM

## 2019-02-02 NOTE — Progress Notes (Signed)
[facility] Counselor/Therapist Progress Note  Patient ID: Helen Greene, MRN: [id number],    Date: 02/02/2019  Time Spent:  60 minutes      11:00am to 12:00noon  Treatment Type: Individual Therapy   Reported Symptoms:  Anxiety, depressed (lessened), "not as lonely right now"  Mental Status Exam:  Appearance:   casual  Behavior:  sharing  Motor:  normal  Speech/Language:   Normal Rate  Affect:  Anxious, depressed "but anxiety is worse than depression"  Mood:  Anxious, some sadness re: loneliness  Thought process:  normal  Thought content:    WNL  Sensory/Perceptual disturbances:    WNL  Orientation:  oriented to person, place, time/date, situation, day of week, month of year and year  Attention:  Good  Concentration:  Good  Memory:  WNL  Fund of knowledge:   Good  Insight:    Good    Judgment:   Good  Impulse Control:  Good     Risk Assessment: Danger to Self:  No Self-injurious Behavior: No Danger to Others: No Duty to Warn:no Physical Aggression / Violence:No  Access to Firearms a concern: No  Gang Involvement:No    Subjective: Patient in today with symptoms listed above, and reports that loneliness in no longer an issue "as I'm mad at men."  Met someone online recently and had a couple dates, first one was ok, and second one did not turn out well.  Made her uncomfortable, and she did set boundaries and the man eventually respected her boundaries after being "quite pushy".  Ended up getting back home safely but is not eager to date again right now. Shared today that she learned from that experience, didn't necessarily feel scared, but did feel manipulated and uncomfortable.   Reports that her anxiety is stronger than depression.  "I just have times when my anxiety is up---usually about work."  "When at work, if I don't feel confident, it makes my anxiety worse."  "If I doubt myself the least little bit, my anxiety gets worse." Discussed some strategies to help her  with the anxiety.  Is showing strength and resilience in working on her symptoms, which can help patient feel more empowered. Is better at recognizing triggers and also how her own self-talk contributes to her depression and anxiety/stress.  Negative self-talk is not helping patient so we worked more on positive self-talk messages.    * (Review with patient): Encouraged patient in practicing good overall self-care (physical, emotional, spiritual, and nutritionally,Patient is more noticeably working to be more positive. spiritually), decrease overthinking and self-critical talk,staying in contact via phone/text/etc with others, getting outside at least twice per day, and walking as able. To keep using re-framing as a strategy with her thought patterns and  looking at things to see what she can control and what she cannot control.   Interventions: Cognitive Behavioral Therapy and Ego-Supportive  Diagnosis:   ICD-10-CM   1. Generalized anxiety disorder  F41.1     Plan:  Goal review and progress noted.  Patient continues work on increasing positive thought patterns, practicing strategies we've discussed previously and in session today.  To continue practice of better communication at work and within family, continue decreasing self-negative talk, decrease the tendency to look at what may go wrong versus right, and ways to improve over-all self-care including physical exercise, and not over-personalizing at work and within the family.  Goal review (through Flowsheets in chart) and progress noted with patient and encouraged  her to keep up her efforts. Next session in 2 wks.  Shanon Ace, LCSW

## 2019-02-13 ENCOUNTER — Other Ambulatory Visit: Payer: Self-pay | Admitting: Physician Assistant

## 2019-02-14 IMAGING — DX DG ABDOMEN ACUTE W/ 1V CHEST
3 series · 3 of 3 positions shown · non-contrast
Comparison: Chest x-ray same date. Chest x-ray 07/30/2017. CT
abdomen 12/07/2016.

CLINICAL DATA: Flu-like symptoms.  Dizziness.

EXAM:
DG ABDOMEN ACUTE W/ 1V CHEST

[abdomen erect]
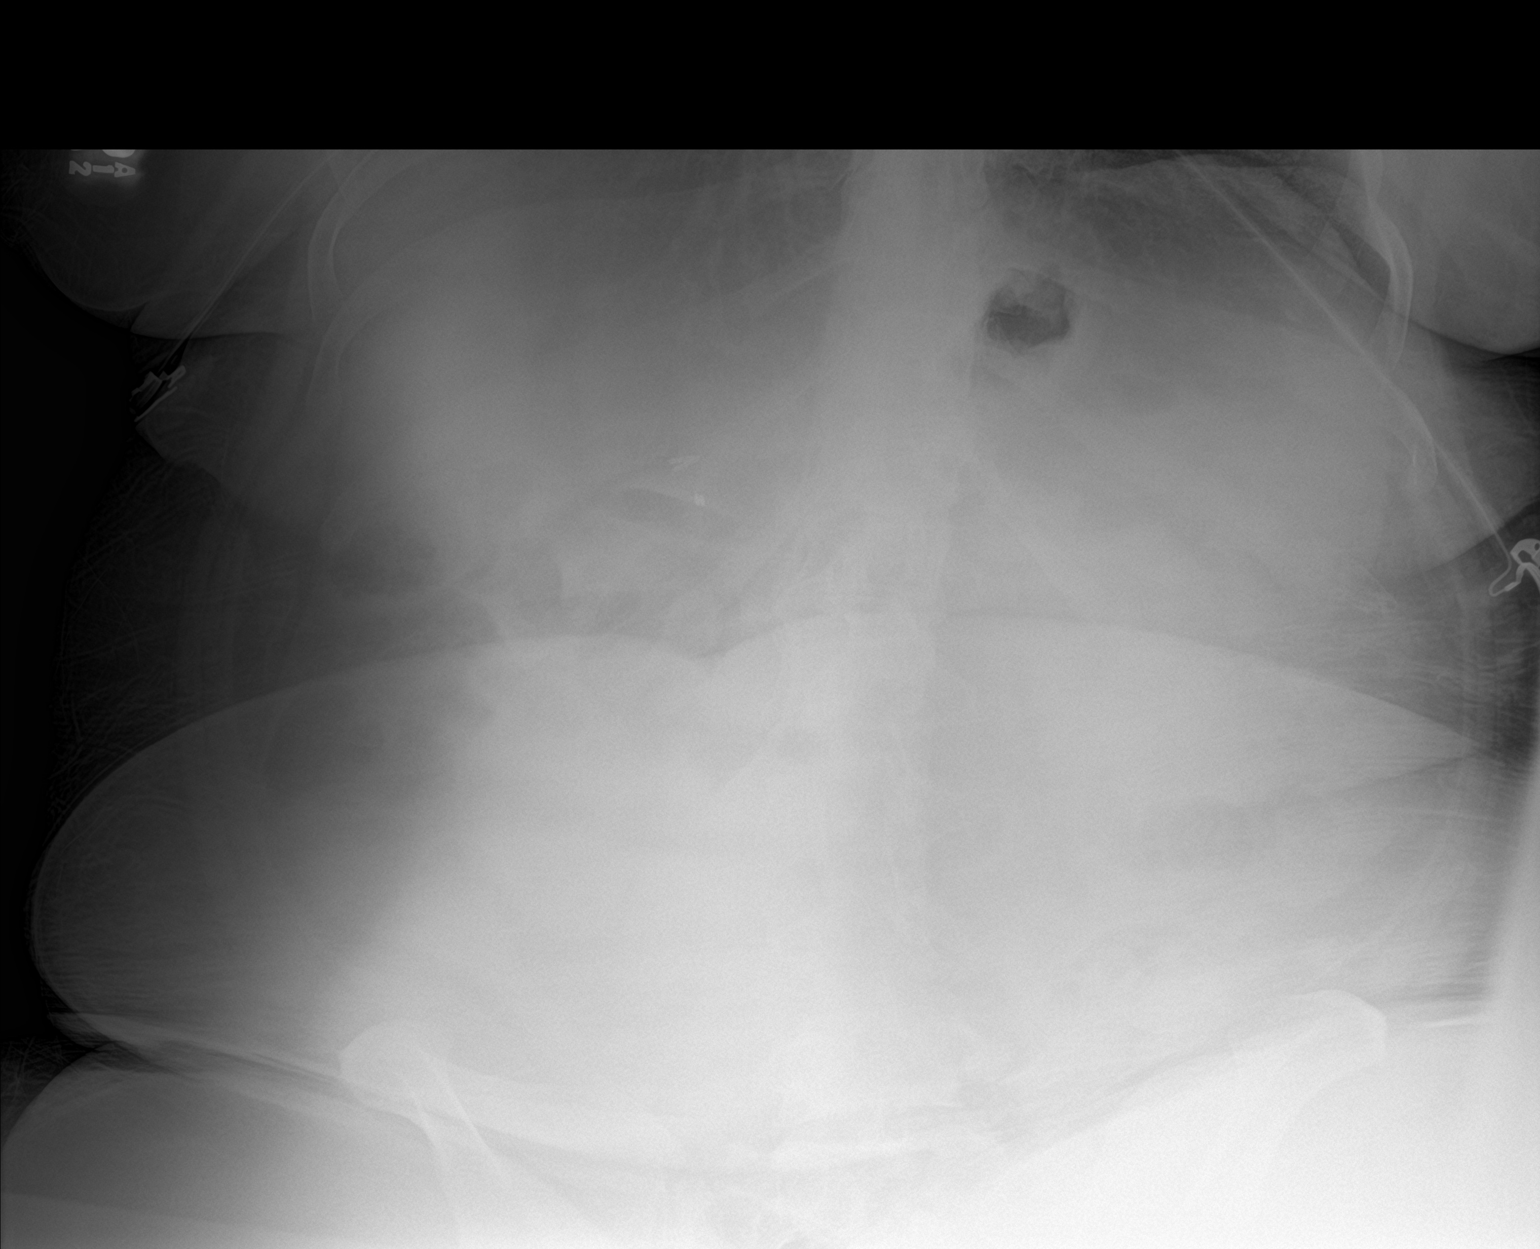

[abdomen supine]
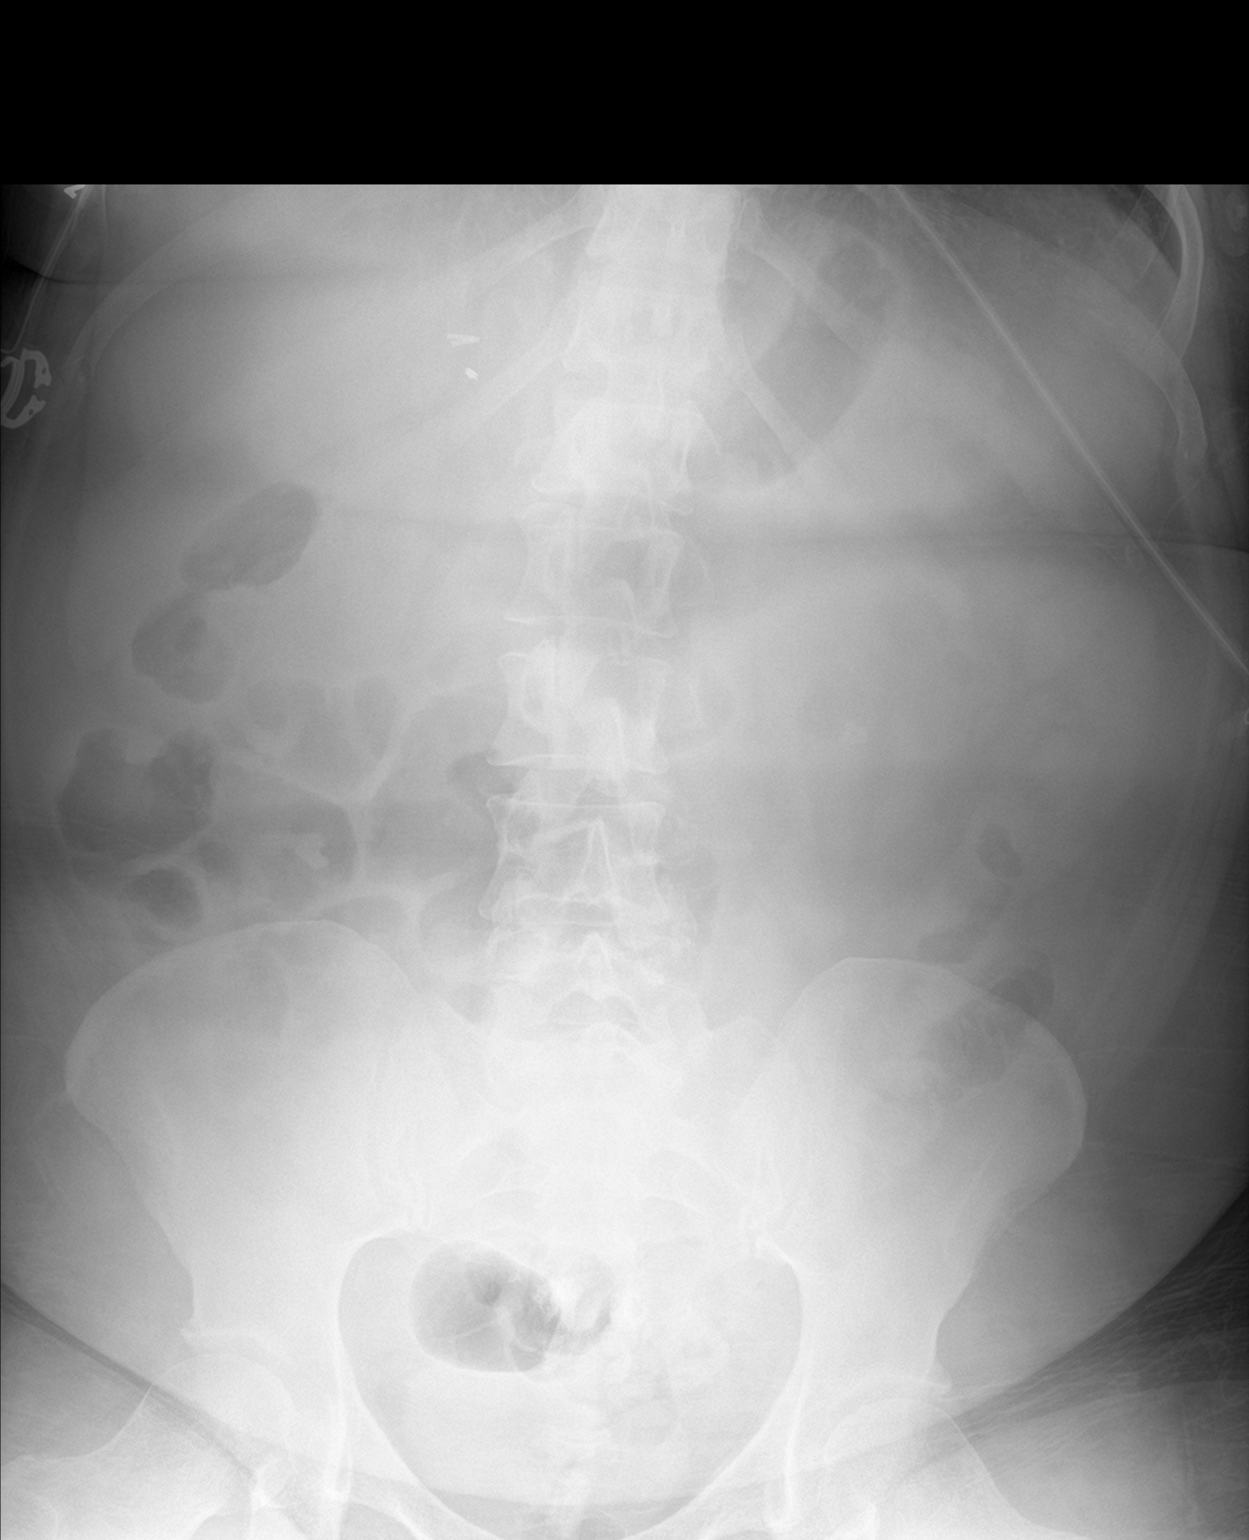

[chest ap]
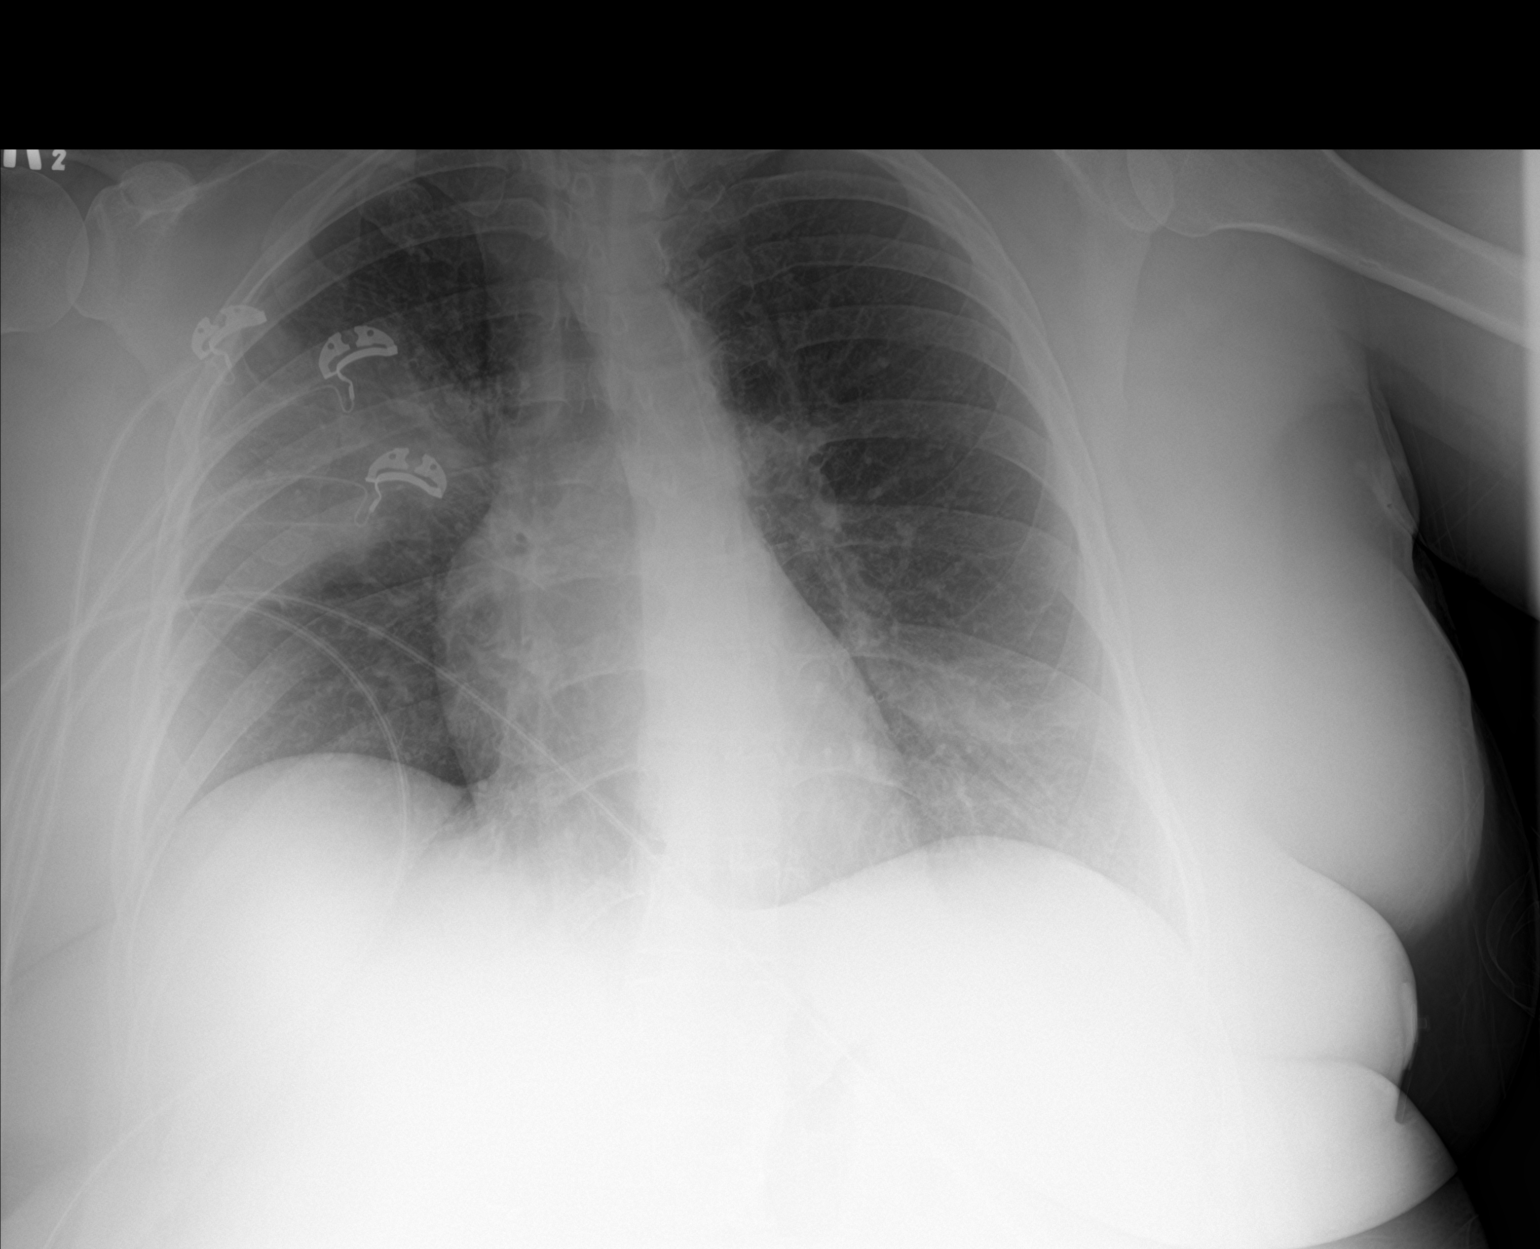

[3 of 3 positions shown; findings below may reference images not displayed]

FINDINGS: Mediastinum hilar structures are normal. Heart size normal. Again
noted is infiltrate right upper lung most consistent pneumonia. Mild
bibasilar subsegmental atelectasis. No pleural effusion or
pneumothorax.

Surgical clips right upper quadrant. No bowel distention. No free
air. Calcification of the left kidney suggesting nephrolithiasis
again noted. Mild lumbar scoliosis concave left.
IMPRESSION: 1. Persistent infiltrate right upper lung consistent with pneumonia.
Mild bibasilar subsegmental atelectasis.

2. No acute intra-abdominal abnormality identified. Left
nephrolithiasis again noted.

## 2019-02-16 ENCOUNTER — Ambulatory Visit (INDEPENDENT_AMBULATORY_CARE_PROVIDER_SITE_OTHER): Payer: 59 | Admitting: Psychiatry

## 2019-02-16 ENCOUNTER — Other Ambulatory Visit: Payer: Self-pay

## 2019-02-16 DIAGNOSIS — F411 Generalized anxiety disorder: Secondary | ICD-10-CM

## 2019-02-16 NOTE — Progress Notes (Signed)
      [facility] Counselor/Therapist Progress Note  Patient ID: Helen Greene, MRN: [id number],    Date: 02/16/2019  Time Spent: 60 minutes     5:00pm to 6:00pm       Treatment Type: Individual Therapy   Reported Symptoms:   Anxiety and depression have worsened; relationship concern   Mental Status Exam:  Appearance:   casual  Behavior:  sharing  Motor:  normal  Speech/Language:   Normal Rate  Affect:  Anxious, depressed "but anxiety is worse than depression"  Mood:  Anxious, some sadness re: loneliness  Thought process:  normal  Thought content:    WNL  Sensory/Perceptual disturbances:    WNL  Orientation:  oriented to person, place, time/date, situation, day of week, month of year and year  Attention:  Good  Concentration:  Good  Memory:  WNL  Fund of knowledge:   Good  Insight:    Good    Judgment:   Good  Impulse Control:  Good     Risk Assessment: Danger to Self:  No Self-injurious Behavior: No Danger to Others: No Duty to Warn:no Physical Aggression / Violence:No  Access to Firearms a concern: No  Gang Involvement:No    Subjective: Patient in office today reporting increased anxiety and depression. Anxiety remains some stronger than the depression. Stress with work, family, and relationship. Friend that was staying with her is leaving with only a couple day's notice so she is mad, sad, and feeling hurt. Tearfully talked through her anger, sadness, and hurt. Did eventually become more calm and grounded but still reports feeling angry and hurt, but not as intense. She is showing more resilience and strength in working through feelings and situations when they happen. Denies any SI or HI.    Interventions: Cognitive Behavioral Therapy and Ego-Supportive  Diagnosis:   ICD-10-CM   1. Generalized anxiety disorder  F41.1     Plan:  Treatment Goal progression: Long Term Goal: 1. Develop healthy cognitive patterns and beliefs and self and the world that lead to  alleviation and help prevent relapse of depression symptoms. Strategy: Patient will work with therapist to develop more positive self-talk that supports healthier beliefs and herself and the world to help decrease her depressive symptoms. (Progressing)  Short Term Goal: 2. Identify and replace depressive thinking that leads to depressive feelings      And actions. Strategy:   Patient will identify triggers to her depressive thinking, interrupt them, and replace the depressive thinking with more positive and hopeful patterns of thinking. (Progressing)   Next appt in 2-3 wks.  Shanon Ace, LCSW

## 2019-02-24 ENCOUNTER — Other Ambulatory Visit: Payer: Self-pay

## 2019-02-24 ENCOUNTER — Ambulatory Visit (INDEPENDENT_AMBULATORY_CARE_PROVIDER_SITE_OTHER): Payer: 59 | Admitting: Physician Assistant

## 2019-02-24 ENCOUNTER — Ambulatory Visit: Payer: 59 | Admitting: Physician Assistant

## 2019-02-24 ENCOUNTER — Encounter: Payer: Self-pay | Admitting: Physician Assistant

## 2019-02-24 DIAGNOSIS — F331 Major depressive disorder, recurrent, moderate: Secondary | ICD-10-CM

## 2019-02-24 DIAGNOSIS — G2581 Restless legs syndrome: Secondary | ICD-10-CM

## 2019-02-24 DIAGNOSIS — F411 Generalized anxiety disorder: Secondary | ICD-10-CM | POA: Diagnosis not present

## 2019-02-24 MED ORDER — PRAMIPEXOLE DIHYDROCHLORIDE 0.25 MG PO TABS: 0.2500 mg | ORAL_TABLET | Freq: Every day | ORAL | 1 refills | Status: DC

## 2019-02-24 MED ORDER — ARIPIPRAZOLE 2 MG PO TABS: 2.0000 mg | ORAL_TABLET | Freq: Every morning | ORAL | 1 refills | Status: DC

## 2019-02-24 NOTE — Progress Notes (Signed)
[facility] Med Check  Patient ID: Helen Greene,  MRN: [id number]  PCP: Forrest Moron, MD  Date of Evaluation: 02/24/2019 Time spent:15 minutes  Chief Complaint:  Chief Complaint    Anxiety; Depression; Follow-up     Virtual Visit via Telephone Note  I connected with patient by a video enabled telemedicine application or telephone, with their informed consent, and verified patient privacy and that I am speaking with the correct person using two identifiers.  I am private, in my home and the patient is home.   I discussed the limitations, risks, security and privacy concerns of performing an evaluation and management service by telephone and the availability of in person appointments. I also discussed with the patient that there may be a patient responsible charge related to this service. The patient expressed understanding and agreed to proceed.   I discussed the assessment and treatment plan with the patient. The patient was provided an opportunity to ask questions and all were answered. The patient agreed with the plan and demonstrated an understanding of the instructions.   The patient was advised to call back or seek an in-person evaluation if the symptoms worsen or if the condition fails to improve as anticipated.  I provided 15 minutes of non-face-to-face time during this encounter.  HISTORY/CURRENT STATUS: HPI For 3 month med check.  Her roommate moved out last week.  It was planned was still pretty abrupt.  She liked him too, romantically, but it wasn't that kind of relationship. "I'm trying to get used to being alone again.  I've been a little more anxious and had to take Ativan last week for the first time in a long time."  Stressed at work, has been given more responsibility, 'they brag on me a lot but I don't feel like I'm doing a good job.'  It stresses me out.   Able to enjoy things, energy is low at times but didn't get like this until recent stressors took place.   motivation is ok. Gets distracted easily though, especially since her friend left. Not isolating anymore than needed b/c COVID pandemic. No SI/HI.   Sleeps ok, but wakes up several times a night.  Able to go back to sleep after 10 mins or so.  Gets a least 8 hours though and feels rested. Anxiety is controlled for the most part. Except last week, d/t the situation.  RLS sx are much better on the pramipexole added at [place].  Denies dizziness, syncope, seizures, numbness, tingling, tremor, tics, unsteady gait, slurred speech, confusion. Denies muscle or joint pain, stiffness, or dystonia.  Individual Medical History/ Review of Systems: Changes? :No    Past medications for mental health diagnoses include: Wellbutrin XL, Risperdal caused edema, Lexapro, Mirapex, Vistaril, questionably Xanax, Abilify, Lamictal, Klonopin,pramipexole for restless legs  Allergies: Bee venom, Enablex [darifenacin hydrobromide], Erythromycin, Macrobid [nitrofurantoin monohyd macro], Metronidazole, Nitrofuran derivatives, and Risperidone and related  Current Medications:  Current Outpatient Medications:  .  ARIPiprazole (ABILIFY) 2 MG tablet, Take 1 tablet (2 mg total) by mouth every morning., Disp: 90 tablet, Rfl: 1 .  azelastine (ASTELIN) 0.1 % nasal spray, Place 2 sprays into both nostrils 3 (three) times daily., Disp: 30 mL, Rfl: 5 .  buPROPion (WELLBUTRIN XL) 300 MG 24 hr tablet, Take 1 tablet (300 mg total) by mouth every morning., Disp: 90 tablet, Rfl: 1 .  escitalopram (LEXAPRO) 20 MG tablet, Take 1 tablet (20 mg total) by mouth daily., Disp: 90 tablet, Rfl: 1 .  LORazepam (  ATIVAN) 0.5 MG tablet, Take 1 tablet (0.5 mg total) by mouth every 8 (eight) hours as needed for anxiety., Disp: 90 tablet, Rfl: 1 .  omeprazole (PRILOSEC) 20 MG capsule, Take 1 capsule (20 mg total) by mouth daily., Disp: 30 capsule, Rfl: 5 .  pramipexole (MIRAPEX) 0.25 MG tablet, Take 1 tablet (0.25 mg total) by mouth at bedtime., Disp: 90  tablet, Rfl: 1 .  telmisartan-hydrochlorothiazide (MICARDIS HCT) 40-12.5 MG tablet, TAKE 1 TABLET DAILY, Disp: 90 tablet, Rfl: 0 .  valACYclovir (VALTREX) 1000 MG tablet, TAKE 2 TABLETS BY MOUTH EVERY 12 HOURS AS NEEDED FOR ONE DAY WITH OUTBREAK, Disp: 20 tablet, Rfl: 0 .  fluticasone (FLONASE) 50 MCG/ACT nasal spray, Place 2 sprays into both nostrils daily. (Patient not taking: Reported on 08/24/2018), Disp: 16 g, Rfl: 5 .  levocetirizine (XYZAL) 5 MG tablet, Take 5 mg by mouth every evening., Disp: , Rfl:  .  loratadine (CLARITIN) 10 MG tablet, Take 1 tablet (10 mg total) by mouth daily. (Patient not taking: Reported on 11/24/2018), Disp: 30 tablet, Rfl: 5 .  mirabegron ER (MYRBETRIQ) 50 MG TB24 tablet, Take 50 mg by mouth daily., Disp: , Rfl:  .  ondansetron (ZOFRAN) 4 MG tablet, Take 1 tablet (4 mg total) by mouth every 8 (eight) hours as needed for nausea or vomiting. (Patient not taking: Reported on 02/24/2019), Disp: 20 tablet, Rfl: 0 Medication Side Effects: none  Family Medical/ Social History: Changes?  Roommate moved out last week.  MENTAL HEALTH EXAM:  Last menstrual period 09/07/2015.There is no height or weight on file to calculate BMI.  General Appearance: unable to assess  Eye Contact:  unable to assess  Speech:  Clear and Coherent  Volume:  Normal  Mood:  Depressed  Affect:  unable to assess  Thought Process:  Goal Directed  Orientation:  Full (Time, Place, and Person)  Thought Content: Logical   Suicidal Thoughts:  No  Homicidal Thoughts:  No  Memory:  WNL  Judgement:  Good  Insight:  Good  Psychomotor Activity:  unable to assess  Concentration:  Concentration: Good  Recall:  Good  Fund of Knowledge: Good  Language: Good  Assets:  Desire for Improvement  ADL's:  Intact  Cognition: WNL  Prognosis:  Good    DIAGNOSES:    ICD-10-CM   1. Major depressive disorder, recurrent episode, moderate (HCC)  F33.1   2. Restless leg syndrome  G25.81   3. Generalized anxiety  disorder  F41.1     Receiving Psychotherapy: Yes  Helen Menghiniebbie Dowd, LCSW   RECOMMENDATIONS:  A lot of her sx are d/t situational stressors.  We discussed waiting to see how she's doing in about a month or so, before any med changes are made, as they may not be necessary yet. Cont Abilify 2 mg q am.  Will increase if needed in a month. Cont Wellbutrin XL 300 mg qd Cont Lexapro 20 mg qd Cont Ativan 0.5mg , 1 tid prn Cont Pramipexole 0.25mg  qhs. Cont therapy w/ Helen Menghiniebbie Dowd, LCSW. Return in 3 months.  Melony Overlyeresa Bertran Zeimet, PA-C   This record has been created using [employer] software.  Chart creation errors have been sought, but may not always have been located and corrected. Such creation errors do not reflect on the standard of medical care.

## 2019-03-02 ENCOUNTER — Ambulatory Visit (INDEPENDENT_AMBULATORY_CARE_PROVIDER_SITE_OTHER): Payer: 59 | Admitting: Psychiatry

## 2019-03-02 ENCOUNTER — Other Ambulatory Visit: Payer: Self-pay

## 2019-03-02 DIAGNOSIS — F331 Major depressive disorder, recurrent, moderate: Secondary | ICD-10-CM | POA: Diagnosis not present

## 2019-03-02 NOTE — Progress Notes (Signed)
      [facility] Counselor/Therapist Progress Note  Patient ID: Helen Greene, MRN: [id number],    Date: 03/02/2019  Time Spent: 60 minutes   5:00pm to 6:00pm  Treatment Type: Individual Therapy  Reported Symptoms: depression, anxiety  Mental Status Exam:  Appearance:   Casual     Behavior:  Appropriate and Sharing  Motor:  Normal  Speech/Language:   Normal Rate  Affect:  Depressed,anxiou  Mood:  anxious and depressed  Thought process:  normal  Thought content:    WNL  Sensory/Perceptual disturbances:    WNL  Orientation:  oriented to person, place, time/date, situation, day of week, month of year and year  Attention:  Good  Concentration:  Good  Memory:  WNL  Fund of knowledge:   Good  Insight:    Good  Judgment:   Good  Impulse Control:  Good   Risk Assessment: Danger to Self:  No Self-injurious Behavior: No Danger to Others: No Duty to Warn:no Physical Aggression / Violence:No  Access to Firearms a concern: No  Gang Involvement:No    Subjective: Patient in today reporting depression and anxiety.  Felt "the blahs all weekend".  Still grieving her friend leaving and going to [place]. Has had another interaction with a guy she met and it didn't go well.  Has blocked that person from being able to contact her. Work stressful.  "Hard getting used to living by myself again". Became very tearful and shared about her grief of friend leaving and difficulty with intense sadness at those times.  Did a good job talking through thoughts and feelings today.  Interventions: Cognitive Behavioral Therapy and Solution-Oriented/Positive Psychology  Diagnosis:   ICD-10-CM   1. Major depressive disorder, recurrent episode, moderate ([place])  F33.1     Plan:   Treatment Goals: Patient not signing tx plan on computer screen due to [place].  Long term goal: Develop the ability to recognize, accept, and cope with feelings of depression.   Short term goal: Identify and replace  depressive thinking that leads to depressive feelings and actions.  Strategy: Reinforce patient's positive, reality based cognitive messages that enhance self-confidence and increase adaptive behaviors/action.  Progress: Patient struggling with low motivation right now and we worked hard to update goal plan to better meet patient's needs.  She does see the updated goals as being "on target" for her.  She is to pay attention to her automatic thoughts over the next couple weeks til next appt and we will process goals more at that time.   Next appt within 2 weeks   Shanon Ace, LCSW

## 2019-03-05 ENCOUNTER — Other Ambulatory Visit: Payer: Self-pay

## 2019-03-08 ENCOUNTER — Encounter: Payer: Self-pay | Admitting: Obstetrics & Gynecology

## 2019-03-08 ENCOUNTER — Other Ambulatory Visit: Payer: Self-pay

## 2019-03-08 ENCOUNTER — Ambulatory Visit (INDEPENDENT_AMBULATORY_CARE_PROVIDER_SITE_OTHER): Payer: 59 | Admitting: Obstetrics & Gynecology

## 2019-03-08 VITALS — BP 128/82 | Ht 59.5 in | Wt 212.0 lb

## 2019-03-08 DIAGNOSIS — Z6841 Body Mass Index (BMI) 40.0 and over, adult: Secondary | ICD-10-CM

## 2019-03-08 DIAGNOSIS — Z01419 Encounter for gynecological examination (general) (routine) without abnormal findings: Secondary | ICD-10-CM

## 2019-03-08 DIAGNOSIS — Z90710 Acquired absence of both cervix and uterus: Secondary | ICD-10-CM

## 2019-03-08 NOTE — Patient Instructions (Signed)
1. Well female exam with routine gynecological exam Gynecologic exam status post total hysterectomy.  Pap test August 2019 was negative, no indication to repeat this year.  Breast exam normal.  Will schedule screening mammogram now.  Colonoscopy more than 3 years ago.  Will repeat at age 48.  Health labs with family physician.  2. History of total hysterectomy  3. Class 3 severe obesity due to excess calories without serious comorbidity with body mass index (BMI) of 40.0 to 44.9 in adult [facility]) Continue with plan of decreasing calories/carbs in diet.  [place].  Aerobic physical activities 5 times a week and weightlifting every 2 days.  Congratulations on your plan to lose 30 pounds by Christmas.  Dorthie, it was a pleasure seeing you today!

## 2019-03-08 NOTE — Progress Notes (Signed)
Helen Greene 09/27/1970 [id number]   History:    48 y.o. G3P2A1L2 Divorced  RP:  Established patient presenting for annual gyn exam   HPI: S/P Total Hysterectomy.  No menopausal Sx.  No pelvic pain.  No pain with IC. Currently abstinent.  Urine/BMs normal.  Breasts Normal.  BMI 42.10.  Has a good friend helping her with lower calorie/carb diet.  Registering to a Gym with him.  Health labs with Fam MD.  Past medical history,surgical history, family history and social history were all reviewed and documented in the EPIC chart.  Gynecologic History Patient's last menstrual period was 09/07/2015 (exact date). Contraception: status post hysterectomy Last Pap: 01/2018. Results were: Negative Last mammogram: 01/2018. Results were: Negative Bone Density: Never Colonoscopy: >3 yrs  Obstetric History OB History  Gravida Para Term Preterm AB Living  3 2     1 2   SAB TAB Ectopic Multiple Live Births      1        # Outcome Date GA Lbr Len/2nd Weight Sex Delivery Anes PTL Lv  3 Ectopic           2 Para           1 Para              ROS: A ROS was performed and pertinent positives and negatives are included in the history.  GENERAL: No fevers or chills. HEENT: No change in vision, no earache, sore throat or sinus congestion. NECK: No pain or stiffness. CARDIOVASCULAR: No chest pain or pressure. No palpitations. PULMONARY: No shortness of breath, cough or wheeze. GASTROINTESTINAL: No abdominal pain, nausea, vomiting or diarrhea, melena or bright red blood per rectum. GENITOURINARY: No urinary frequency, urgency, hesitancy or dysuria. MUSCULOSKELETAL: No joint or muscle pain, no back pain, no recent trauma. DERMATOLOGIC: No rash, no itching, no lesions. ENDOCRINE: No polyuria, polydipsia, no heat or cold intolerance. No recent change in weight. HEMATOLOGICAL: No anemia or easy bruising or bleeding. NEUROLOGIC: No headache, seizures, numbness, tingling or weakness. PSYCHIATRIC: No depression, no  loss of interest in normal activity or change in sleep pattern.     Exam:   BP 128/82    Ht 4' 11.5" (1.511 m)    Wt 212 lb (96.2 kg)    LMP 09/07/2015 (Exact Date)    BMI 42.10 kg/m   Body mass index is 42.1 kg/m.  General appearance : Well developed well nourished female. No acute distress HEENT: Eyes: no retinal hemorrhage or exudates,  Neck supple, trachea midline, no carotid bruits, no thyroidmegaly Lungs: Clear to auscultation, no rhonchi or wheezes, or rib retractions  Heart: Regular rate and rhythm, no murmurs or gallops Breast:Examined in sitting and supine position were symmetrical in appearance, no palpable masses or tenderness,  no skin retraction, no nipple inversion, no nipple discharge, no skin discoloration, no axillary or supraclavicular lymphadenopathy Abdomen: no palpable masses or tenderness, no rebound or guarding Extremities: no edema or skin discoloration or tenderness  Pelvic: Vulva: Normal             Vagina: No gross lesions or discharge  Cervix/Uterus surgically absent  Adnexa  Without masses or tenderness  Anus: Normal   Assessment/Plan:  48 y.o. female for annual exam   1. Well female exam with routine gynecological exam Gynecologic exam status post total hysterectomy.  Pap test August 2019 was negative, no indication to repeat this year.  Breast exam normal.  Will schedule screening mammogram now.  Colonoscopy more than 3 years ago.  Will repeat at age 64.  Health labs with family physician.  2. History of total hysterectomy  3. Class 3 severe obesity due to excess calories without serious comorbidity with body mass index (BMI) of 40.0 to 44.9 in adult [facility]) Continue with plan of decreasing calories/carbs in diet.  Suggest [employer].  Aerobic physical activities 5 times a week and weightlifting every 2 days.  Congratulations on your plan to lose 30 pounds by Christmas.  Genia Del MD, 4:54 PM 03/08/2019

## 2019-03-16 ENCOUNTER — Ambulatory Visit (INDEPENDENT_AMBULATORY_CARE_PROVIDER_SITE_OTHER): Payer: 59 | Admitting: Psychiatry

## 2019-03-16 ENCOUNTER — Other Ambulatory Visit: Payer: Self-pay

## 2019-03-16 DIAGNOSIS — F411 Generalized anxiety disorder: Secondary | ICD-10-CM

## 2019-03-16 NOTE — Progress Notes (Signed)
      [facility] Counselor/Therapist Progress Note  Patient ID: Helen Greene, MRN: [id number],    Date: 03/16/2019  Time Spent: 60 minutes    5:00pm to 6:00pm  Treatment Type: Individual Therapy  Reported Symptoms:  Anxiety, depression ("less for now")  Mental Status Exam:  Appearance:   Casual     Behavior:  Appropriate and Sharing  Motor:  Normal  Speech/Language:   Normal Rate  Affect:  anxious  Mood:  anxious  Thought process:  goal directed  Thought content:    WNL  Sensory/Perceptual disturbances:    WNL  Orientation:  oriented to person, place, time/date, situation, day of week, month of year and year  Attention:  Good  Concentration:  Good  Memory:  WNL  Fund of knowledge:   Good  Insight:    Good  Judgment:   Good  Impulse Control:  Good   Risk Assessment: Danger to Self:  No Self-injurious Behavior: No Danger to Others: No Duty to Warn:no Physical Aggression / Violence:No  Access to Firearms a concern: No  Gang Involvement:No   Subjective:  Patient in today feeling some less depressed and more anxious.  Has begun step of self-care in trying to reduce her weight and begin some exercise again.  Is working on try to let go of feelings re: friend that recently moved away. Mood seems some better today.  Resting better at night. Stressed with work and also with trying to be more social and meet/interact with more people.    Interventions: Cognitive Behavioral Therapy and Ego-Supportive  Diagnosis:   ICD-10-CM   1. Generalized anxiety disorder  F41.1     Plan:  Patient not signing tx plan on computer screen due to [place].  Treatment Goals: Goals remain on tx plan as patient works on strategies to meet her goals.  Progress will be documented each session in "Progress" section on plan.   Long term goal: Develop the ability to recognize, accept, and cope with feelings of depression.   Short term goal: Identify and replace depressive thinking that leads  to depressive feelings and actions.  Strategy: Reinforce patient's positive, reality based cognitive messages that enhance self-confidence and increase adaptive behaviors/action.  Progress: Motivation has improved.  Discussed her long and short term goals.  She feels she is working on more quickly recognizing her depressive thoughts and trying to replace them with more positive thoughts that do not lead to depressive actions.  Have had success with this twice already since last session.  Has also had some success with automatic thoughts and finds this to be harder to catch. Working to realize also "what she can control and what she cannot control" and focus more attention on what she can control. Mood better.   Return within 2-3 wks for next appt.   Shanon Ace, LCSW

## 2019-03-29 ENCOUNTER — Ambulatory Visit (INDEPENDENT_AMBULATORY_CARE_PROVIDER_SITE_OTHER): Payer: 59 | Admitting: Family Medicine

## 2019-03-29 ENCOUNTER — Encounter: Payer: Self-pay | Admitting: Family Medicine

## 2019-03-29 ENCOUNTER — Other Ambulatory Visit: Payer: Self-pay

## 2019-03-29 VITALS — BP 135/87 | HR 86 | Temp 98.3°F | Resp 16 | Ht 59.0 in | Wt 208.2 lb

## 2019-03-29 DIAGNOSIS — K219 Gastro-esophageal reflux disease without esophagitis: Secondary | ICD-10-CM

## 2019-03-29 DIAGNOSIS — I10 Essential (primary) hypertension: Secondary | ICD-10-CM | POA: Diagnosis not present

## 2019-03-29 LAB — BASIC METABOLIC PANEL
BUN/Creatinine Ratio: 20 (ref 9–23)
BUN: 19 mg/dL (ref 6–24)
CO2: 21 mmol/L (ref 20–29)
Calcium: 9.7 mg/dL (ref 8.7–10.2)
Chloride: 105 mmol/L (ref 96–106)
Creatinine, Ser: 0.96 mg/dL (ref 0.57–1.00)
GFR calc Af Amer: 80 mL/min/{1.73_m2} (ref >59)
GFR calc non Af Amer: 70 mL/min/{1.73_m2} (ref >59)
Glucose: 92 mg/dL (ref 65–99)
Potassium: 4.3 mmol/L (ref 3.5–5.2)
Sodium: 141 mmol/L (ref 134–144)

## 2019-03-29 MED ORDER — OMEPRAZOLE 20 MG PO CPDR: 20.0000 mg | DELAYED_RELEASE_CAPSULE | Freq: Every day | ORAL | 3 refills | Status: DC

## 2019-03-29 MED ORDER — TELMISARTAN-HCTZ 40-12.5 MG PO TABS: 1.0000 | ORAL_TABLET | Freq: Every day | ORAL | 1 refills | Status: DC

## 2019-03-29 NOTE — Progress Notes (Signed)
Established Patient Office Visit  Subjective:  Patient ID: Helen Greene, female    DOB: Jan 06, 1971  Age: 48 y.o. MRN: [id number]  CC:  Chief Complaint  Patient presents with  . Medication Refill    omeprazole and telmisartan    HPI Helen Greene presents for    Hypertension: Patient here for follow-up of elevated blood pressure. She is exercising and is adherent to low salt diet.  Blood pressure is well controlled at home. Cardiac symptoms none. Patient denies chest pain, claudication, exertional chest pressure/discomfort, irregular heart beat and lower extremity edema.  Cardiovascular risk factors: hypertension and obesity (BMI >= 30 kg/m2). Use of agents associated with hypertension: none. History of target organ damage: none. She needs refills of her Telmisartan and is out of her medication.  She reports that off meds her bp is 130s/90s She states that she is working on weight loss. She states that she gets some fatigue with exercise.  BP Readings from Last 3 Encounters:  03/29/19 135/87  03/08/19 128/82  07/29/18 128/86   Morbid Obesity Body mass index is 42.05 kg/m. She stated a low carb diet and she started the process and now she feels like she is starving all the time.  She is drinking low carb, high protein shakes as well. She is not eating much vegetables as yet.   She reports that she is drinking water and no sweets. Wt Readings from Last 3 Encounters:  03/29/19 208 lb 3.2 oz (94.4 kg)  03/08/19 212 lb (96.2 kg)  02/18/18 197 lb (89.4 kg)   GERD She has noticed increase heartburn especially because of the changes to her diet She denies bile eruptions or bilious emesis She denies brackish water in her mouth She gets symptoms when she is hungry    Past Medical History:  Diagnosis Date  . Allergy   . Anxiety   . Depression   . Elevated white blood cell count   . Elevated white blood cell count 09/25/2011  . GERD (gastroesophageal reflux disease)   .  Headache    Migraine  . Hypertension   . Kidney stone   . Neuromuscular disorder (HCC)    carpel tunnel disease  . Pneumonia 08/15/2017  . Restless leg syndrome   . Shortness of breath dyspnea    with exertion  . Sleep apnea    does not have CPAP machine, mostly affects back sleeping was encouraged to sleep on her sides  . Tonsillar calculus     Past Surgical History:  Procedure Laterality Date  . COLONOSCOPY    . DILATION AND CURETTAGE OF UTERUS  1993  . ECTOPIC PREGNANCY SURGERY    . ESSURE TUBAL LIGATION    . LAPAROSCOPIC CHOLECYSTECTOMY W/ CHOLANGIOGRAPHY  02/25/2001   Dr Lindie SpruceWyatt  . LITHOTRIPSY  12/22/2014  . ROBOTIC ASSISTED TOTAL HYSTERECTOMY WITH SALPINGECTOMY Left 09/14/2015   Procedure: ROBOTIC ASSISTED TOTAL HYSTERECTOMY WITH LEFT SALPINGECTOMY;  Surgeon: Noland FordyceKelly Fogleman, MD;  Location: [facility];  Service: Gynecology;  Laterality: Left;  . TONSILLECTOMY N/A 08/11/2017   Procedure: TONSILLECTOMY;  Surgeon: Serena Colonelosen, Jefry, MD;  Location: [facility];  Service: ENT;  Laterality: N/A;  . UPPER GI ENDOSCOPY    . WISDOM TOOTH EXTRACTION      Family History  Problem Relation Age of Onset  . Hypertension Mother   . Heart failure Mother   . Cancer Mother   . Heart disease Mother   . Heart disease Brother   . Hypertension  Brother   . Cancer Father        Leukemia  . Hypertension Father   . Cancer Sister   . Breast cancer Sister   . Diabetes Other   . Colon cancer Neg Hx   . Esophageal cancer Neg Hx   . Rectal cancer Neg Hx   . Stomach cancer Neg Hx     Social History   Socioeconomic History  . Marital status: Divorced    Spouse name: Not on file  . Number of children: Not on file  . Years of education: Not on file  . Highest education level: Not on file  Occupational History  . Not on file  Social Needs  . Financial resource strain: Not on file  . Food insecurity    Worry: Not on file    Inability: Not on file  . Transportation needs     Medical: Not on file    Non-medical: Not on file  Tobacco Use  . Smoking status: Never Smoker  . Smokeless tobacco: Never Used  Substance and Sexual Activity  . Alcohol use: Yes    Alcohol/week: 1.0 standard drinks    Types: 1 Cans of beer per week    Comment: occas  . Drug use: No  . Sexual activity: Not Currently    Partners: Male    Comment: 1ST intercourse- 18, partners-  2   Lifestyle  . Physical activity    Days per week: Not on file    Minutes per session: Not on file  . Stress: Not on file  Relationships  . Social Musician on phone: Not on file    Gets together: Not on file    Attends religious service: Not on file    Active member of club or organization: Not on file    Attends meetings of clubs or organizations: Not on file    Relationship status: Not on file  . Intimate partner violence    Fear of current or ex partner: Not on file    Emotionally abused: Not on file    Physically abused: Not on file    Forced sexual activity: Not on file  Other Topics Concern  . Not on file  Social History Narrative  . Not on file    Outpatient Medications Prior to Visit  Medication Sig Dispense Refill  . ARIPiprazole (ABILIFY) 2 MG tablet Take 1 tablet (2 mg total) by mouth every morning. 90 tablet 1  . azelastine (ASTELIN) 0.1 % nasal spray Place 2 sprays into both nostrils 3 (three) times daily. 30 mL 5  . buPROPion (WELLBUTRIN XL) 300 MG 24 hr tablet Take 1 tablet (300 mg total) by mouth every morning. 90 tablet 1  . escitalopram (LEXAPRO) 20 MG tablet Take 1 tablet (20 mg total) by mouth daily. 90 tablet 1  . Ferrous Sulfate (IRON PO) Take by mouth.    . fluticasone (FLONASE) 50 MCG/ACT nasal spray Place 2 sprays into both nostrils daily. 16 g 5  . LORazepam (ATIVAN) 0.5 MG tablet Take 1 tablet (0.5 mg total) by mouth every 8 (eight) hours as needed for anxiety. 90 tablet 1  . mirabegron ER (MYRBETRIQ) 50 MG TB24 tablet Take 50 mg by mouth daily.    .  ondansetron (ZOFRAN) 4 MG tablet Take 1 tablet (4 mg total) by mouth every 8 (eight) hours as needed for nausea or vomiting. 20 tablet 0  . pramipexole (MIRAPEX) 0.25 MG tablet Take 1 tablet (  0.25 mg total) by mouth at bedtime. 90 tablet 1  . valACYclovir (VALTREX) 1000 MG tablet TAKE 2 TABLETS BY MOUTH EVERY 12 HOURS AS NEEDED FOR ONE DAY WITH OUTBREAK 20 tablet 0  . VITAMIN D PO Take by mouth.    . levocetirizine (XYZAL) 5 MG tablet Take 5 mg by mouth every evening.    . loratadine (CLARITIN) 10 MG tablet Take 1 tablet (10 mg total) by mouth daily. 30 tablet 5  . omeprazole (PRILOSEC) 20 MG capsule Take 1 capsule (20 mg total) by mouth daily. 30 capsule 5  . telmisartan-hydrochlorothiazide (MICARDIS HCT) 40-12.5 MG tablet TAKE 1 TABLET DAILY 90 tablet 0   No facility-administered medications prior to visit.     Allergies  Allergen Reactions  . Bee Venom Hives and Swelling  . Enablex [Darifenacin Hydrobromide] Other (See Comments)    Phlebitis.  . Erythromycin Hives  . Macrobid [Nitrofurantoin Monohyd Macro] Nausea And Vomiting  . Metronidazole     GI upset with tablet form  . Nitrofuran Derivatives Nausea And Vomiting  . Risperidone And Related Swelling    ROS Review of Systems Review of Systems  Constitutional: Negative for activity change, appetite change, chills and fever.  HENT: Negative for congestion, nosebleeds, trouble swallowing and voice change.   Respiratory: Negative for cough, shortness of breath and wheezing.   Gastrointestinal: Negative for diarrhea, nausea and vomiting.  Genitourinary: Negative for difficulty urinating, dysuria, flank pain and hematuria.  Musculoskeletal: Negative for back pain, joint swelling and neck pain.  Neurological: Negative for dizziness, speech difficulty, light-headedness and numbness.  See HPI. All other review of systems negative.     Objective:    Physical Exam  BP 135/87   Pulse 86   Temp 98.3 F (36.8 C) (Oral)   Resp 16    Ht  (1.499 m)   Wt 208 lb 3.2 oz (94.4 kg)   LMP 09/07/2015 (Exact Date)   SpO2 97%   BMI 42.05 kg/m  Wt Readings from Last 3 Encounters:  03/29/19 208 lb 3.2 oz (94.4 kg)  03/08/19 212 lb (96.2 kg)  02/18/18 197 lb (89.4 kg)   Physical Exam  Constitutional: Oriented to person, place, and time. Appears well-developed and well-nourished.  HENT:  Head: Normocephalic and atraumatic.  Eyes: Conjunctivae and EOM are normal.  Cardiovascular: Normal rate, regular rhythm, normal heart sounds and intact distal pulses.  No murmur heard. Pulmonary/Chest: Effort normal and breath sounds normal. No stridor. No respiratory distress. Has no wheezes.  Neurological: Is alert and oriented to person, place, and time.  Skin: Skin is warm. Capillary refill takes less than 2 seconds.  Psychiatric: Has a normal mood and affect. Behavior is normal. Judgment and thought content normal.    There are no preventive care reminders to display for this patient.  There are no preventive care reminders to display for this patient.  Lab Results  Component Value Date   TSH 2.540 02/13/2015   Lab Results  Component Value Date   WBC 14.3 (H) 11/12/2017   HGB 13.2 11/12/2017   HCT 40.2 11/12/2017   MCV 89.9 11/12/2017   PLT 413 (H) 11/12/2017   Lab Results  Component Value Date   NA 137 11/12/2017   K 3.2 (L) 11/12/2017   CO2 23 11/12/2017   GLUCOSE 84 11/12/2017   BUN 9 11/12/2017   CREATININE 0.74 11/12/2017   BILITOT 0.7 08/16/2017   ALKPHOS 163 (H) 08/16/2017   AST 29 08/16/2017   ALT  62 (H) 08/16/2017   PROT 5.8 (L) 08/16/2017   ALBUMIN 2.1 (L) 08/16/2017   CALCIUM 8.7 (L) 11/12/2017   ANIONGAP 8 11/12/2017   Lab Results  Component Value Date   CHOL 265 (H) 06/28/2016   Lab Results  Component Value Date   HDL 46 06/28/2016   Lab Results  Component Value Date   LDLCALC 160 (H) 06/28/2016   Lab Results  Component Value Date   TRIG 297 (H) 06/28/2016   Lab Results   Component Value Date   CHOLHDL 5.8 (H) 06/28/2016   Lab Results  Component Value Date   HGBA1C 5.4 06/28/2016      Assessment & Plan:   Problem List Items Addressed This Visit      Cardiovascular and Mediastinum   Essential hypertension - Primary  - Patient's blood pressure is at goal of 139/89 or less. Condition is stable. Continue current medications and treatment plan. I recommend that you exercise for 30-45 minutes 5 days a week. I also recommend a balanced diet with fruits and vegetables every day, lean meats, and little fried foods. The DASH diet (you can find this online) is a good example of this.    Relevant Medications   telmisartan-hydrochlorothiazide (MICARDIS HCT) 40-12.5 MG tablet   Other Relevant Orders   Basic metabolic panel    Other Visit Diagnoses    Gastroesophageal reflux disease without esophagitis    -  Refilled PPI and discussed dietary modification   Relevant Medications   omeprazole (PRILOSEC) 20 MG capsule   Morbid obesity ([place])    -  Discussed weight loss, her weight is improved      Meds ordered this encounter  Medications  . omeprazole (PRILOSEC) 20 MG capsule    Sig: Take 1 capsule (20 mg total) by mouth daily.    Dispense:  90 capsule    Refill:  3  . telmisartan-hydrochlorothiazide (MICARDIS HCT) 40-12.5 MG tablet    Sig: Take 1 tablet by mouth daily.    Dispense:  90 tablet    Refill:  1    Follow-up: Return in about 6 months (around 09/27/2019) for cholesterol check and blood pressure follow up .    Forrest Moron, MD

## 2019-03-29 NOTE — Patient Instructions (Addendum)
If you have lab work done today you will be contacted with your lab results within the next 2 weeks.  If you have not heard from [place] then please contact us. The fastest way to get your results is to register for My Chart.   IF you received an x-ray today, you will receive an invoice from [facility]. Please contact [facility] at [phone] with questions or concerns regarding your invoice.   IF you received labwork today, you will receive an invoice from [facility]. Please contact [facility] at [phone] with questions or concerns regarding your invoice.   Our billing staff will not be able to assist you with questions regarding bills from these companies.  You will be contacted with the lab results as soon as they are available. The fastest way to get your results is to activate your My Chart account. Instructions are located on the last page of this paperwork. If you have not heard from [place] regarding the results in 2 weeks, please contact this office.      Lipid Profile Test Why am I having this test? The lipid profile test can be used to help evaluate your risk for developing heart disease. The test is also used to monitor your levels during treatment for high cholesterol to see if you are reaching your goals. What is being tested? A lipid profile measures the following:  Total cholesterol. Cholesterol is a waxy, fat-like substance in your blood. If your total cholesterol level is high, this can increase your risk for heart disease.  High-density lipoprotein (HDL). This is known as the good cholesterol. Having high levels of HDL decreases your risk for heart disease. Your HDL level may be low if you smoke or do not get enough exercise.  Low-density lipoprotein (LDL). This is known as the bad cholesterol. This type causes plaque to build up in your arteries. Having a low level of LDL is best. Having high levels of LDL increases your risk for heart  disease.  Cholesterol to HDL ratio. This is calculated by dividing your total cholesterol by your HDL cholesterol. The ratio is used by health care providers to determine your risk for heart disease. A low ratio is best.  Triglycerides. These are fats that your body can store or burn for energy. Low levels are best. Having high levels of triglycerides increases your risk for heart disease. What kind of sample is taken?  A blood sample is required for this test. It is usually collected by inserting a needle into a blood vessel. How do I prepare for this test? Do not drink alcohol starting at least 24 hours before your test. Follow any instructions from your health care provider about dietary restrictions before your test. Do not eat or drink anything other than water after midnight on the night before the test, or as told by your health care provider. Tell a health care provider about:  All medicines you are taking, including vitamins, herbs, eye drops, creams, and over-the-counter medicines.  Any medical conditions you have.  Whether you are pregnant or may be pregnant. How are the results reported? Your test results will be reported as values that indicate your cholesterol and triglyceride levels. Your health care provider will compare your results to normal ranges that were established after testing a large group of people (reference ranges). Reference ranges may vary among labs and hospitals. For this test, common reference ranges are: Total cholesterol  Adult or elderly: less than 200  mg/dL.  Child: 120-200 mg/dL.  Infant: 70-175 mg/dL.  Newborn: 53-135 mg/dL. HDL  Female: greater than 45 mg/dL.  Female: greater than 55 mg/dL. HDL reference values based on your risk for heart disease:  Low risk for heart disease: ? Female: 60 mg/dL. ? Female: 70 mg/dL.  Moderate risk for heart disease: ? Female: 45 mg/dL. ? Female: 55 mg/dL.  High risk for heart disease: ? Female: 25  mg/dL. ? Female: 35 mg/dL. LDL  Adults: Your health care provider will determine a target level for LDL based on your risk for heart disease. ? If you are at low risk, your LDL should be 130 mg/dL or less. ? If you are at moderate risk, your LDL should be 100 mg/dL or less. ? If you are at high risk, your LDL should be 70 mg/dL or less.  Children: less than 110 mg/dL. Cholesterol to HDL ratio Reference values based on your risk for heart disease:  Risk that is half the average risk: ? Female: 3.4. ? Female: 3.3.  Average risk: ? Female: 5.0. ? Female: 4.4.  Risk that is two times average (moderate risk): ? Female: 10.0. ? Female: 7.0.  Risk that is three times average (high risk): ? Female: 24.0. ? Female: 11.0. Triglycerides  Adult or elderly: ? Female: 40-160 mg/dL. ? Female: 35-135 mg/dL.  Children 28-76 years old: ? Female: 40-163 mg/dL. ? Female: 40-128 mg/dL.  Children 50-29 years old: ? Female: 36-138 mg/dL. ? Female: 41-138 mg/dL.  Children 16-40 years old: ? Female: 31-108 mg/dL. ? Female: 35-114 mg/dL.  Children 22-35 years old: ? Female: 30-86 mg/dL. ? Female: 32-99 mg/dL. Triglycerides should be less than 400 mg/dL even when you are not fasting. What do the results mean? Results that are within the reference ranges are considered normal. Total cholesterol, LDL, and triglyceride levels that are higher than the reference ranges can mean that you have an increased risk for heart disease. An HDL level that is lower than the reference range can also indicate an increased risk. Talk with your health care provider about what your results mean. Questions to ask your health care provider Ask your health care provider, or the department that is doing the test:  When will my results be ready?  How will I get my results?  What are my treatment options?  What other tests do I need?  What are my next steps? Summary  The lipid profile test can be used to help predict the  likelihood that you will develop heart disease. It can also help monitor your cholesterol levels during treatment.  A lipid profile measures your total cholesterol, high-density lipoprotein (HDL), low-density lipoprotein (LDL), cholesterol to HDL ratio, and triglycerides.  Total cholesterol, LDL, and triglyceride levels that are higher than the reference ranges can indicate an increased risk for heart disease.  An HDL level that is lower than the reference range can indicate an increased risk for heart disease.  Talk with your health care provider about what your results mean. This information is not intended to replace advice given to you by your health care provider. Make sure you discuss any questions you have with your health care provider. Document Released: 06/27/2004 Document Revised: 03/10/2017 Document Reviewed: 03/10/2017 Elsevier Patient Education  2020 [employer].

## 2019-03-30 ENCOUNTER — Other Ambulatory Visit: Payer: Self-pay

## 2019-03-30 ENCOUNTER — Ambulatory Visit (INDEPENDENT_AMBULATORY_CARE_PROVIDER_SITE_OTHER): Payer: 59 | Admitting: Psychiatry

## 2019-03-30 DIAGNOSIS — F411 Generalized anxiety disorder: Secondary | ICD-10-CM | POA: Diagnosis not present

## 2019-03-30 NOTE — Progress Notes (Signed)
      [facility] Counselor/Therapist Progress Note  Patient ID: Helen Greene, MRN: [id number],    Date: 03/30/2019  Time Spent:  60 minutes   5:00pm to 6:00pm   Treatment Type: Individual Therapy  Reported Symptoms: Anxiety "but improving some" ; sadness at times  Mental Status Exam:  Appearance:   Casual     Behavior:  Appropriate and Sharing  Motor:  Normal  Speech/Language:   Normal Rate  Affect:  anxiety  Mood:  anxious and occasional sadness "if I get my feelings hurt"  Thought process:  goal directed  Thought content:    WNL  Sensory/Perceptual disturbances:    WNL  Orientation:  oriented to person, place, time/date, situation, day of week, month of year and year  Attention:  Good  Concentration:  Good  Memory:  occasional forgetfulness  Fund of knowledge:   Good  Insight:    Good  Judgment:   Good  Impulse Control:  Good   Risk Assessment: Danger to Self:  No Self-injurious Behavior: No Danger to Others: No Duty to Warn:no Physical Aggression / Violence:No  Access to Firearms a concern: No  Gang Involvement:No   Subjective: Patient reports some decrease in her anxiety, some improvement with relationship with son, and some progress in working to better handle loss of close room-mate who moved away. Having a difficult time with a close friend where there's communication problems and some trust issues.   Interventions: Cognitive Behavioral Therapy and Ego-Supportive  Diagnosis:   ICD-10-CM   1. Generalized anxiety disorder  F41.1      Plan: Patient not signing tx plan on computer screen due to [place].  Treatment Goals:Goals remain on tx plan as patient works on strategies  to meet her goals. Progress will be documented each session in "Progress" section on plan.   Long term goal: Develop the ability to recognize, accept, and cope with feelings of anxiety and  depression.   Short term goal: Identify and replace anxious and depressive thinking  that leads to anxious or depressive feelings and actions.  Strategy: Reinforce patient's positive, reality based cognitive messages that enhance self-confidence and increase adaptive behaviors/action.  Progress: Patient is making progress and remains motivated for treatment and change.  Shares today that she is continuing to work on "watching my thoughts closer and stopping them when they're negative, and trying to switch to thoughts that are not so negative and better for me."  Reports she has not been consistent yet in "catching her negative thoughts".  We discussed the triggers to her negative/anxious/depressive thoughts and she was able to identify at least at least 2, and able to connect that with the idea of watching for those triggers and try to not let herself begin the negative/anxious/depressive thought patterns.  Still working to recognize and remind herself "what is in my control and what is not in my control." Patient really engaged today and shows some excitement about feeling some better.  Validated her good work, goal review and progress noted with her.     Next appt in 2-3 weeks.   Shanon Ace, LCSW

## 2019-04-12 ENCOUNTER — Encounter: Payer: Self-pay | Admitting: Emergency Medicine

## 2019-04-12 ENCOUNTER — Ambulatory Visit (INDEPENDENT_AMBULATORY_CARE_PROVIDER_SITE_OTHER): Payer: 59 | Admitting: Emergency Medicine

## 2019-04-12 ENCOUNTER — Other Ambulatory Visit: Payer: Self-pay

## 2019-04-12 VITALS — BP 143/94 | HR 85 | Temp 98.1°F | Ht 60.0 in | Wt 207.0 lb

## 2019-04-12 DIAGNOSIS — K122 Cellulitis and abscess of mouth: Secondary | ICD-10-CM

## 2019-04-12 MED ORDER — AMOXICILLIN-POT CLAVULANATE 875-125 MG PO TABS: 1.0000 | ORAL_TABLET | Freq: Two times a day (BID) | ORAL | 0 refills | Status: AC

## 2019-04-12 MED ORDER — FLUCONAZOLE 150 MG PO TABS: 150.0000 mg | ORAL_TABLET | Freq: Once | ORAL | 0 refills | Status: AC

## 2019-04-12 MED ORDER — OMEPRAZOLE 20 MG PO CPDR: 20.0000 mg | DELAYED_RELEASE_CAPSULE | Freq: Every day | ORAL | 3 refills | Status: DC

## 2019-04-12 MED ORDER — TELMISARTAN-HCTZ 40-12.5 MG PO TABS: 1.0000 | ORAL_TABLET | Freq: Every day | ORAL | 1 refills | Status: DC

## 2019-04-12 NOTE — Progress Notes (Signed)
Helen Greene 48 y.o.   Chief Complaint  Patient presents with   thrust    Pt stated having uncomforatble, redness inside the mouth but no fever--4 weeks.    HISTORY OF PRESENT ILLNESS: This is a 48 y.o. female complaining of possible mouth infection for several weeks.  Noticed redness inside the mouth and swollen and tender lymph nodes under her jaw.  Denies fever or any other significant symptoms.  HPI   Prior to Admission medications   Medication Sig Start Date End Date Taking? Authorizing Provider  ARIPiprazole (ABILIFY) 2 MG tablet Take 1 tablet (2 mg total) by mouth every morning. 02/24/19  Yes Hurst, Rosey Bath T, PA-C  azelastine (ASTELIN) 0.1 % nasal spray Place 2 sprays into both nostrils 3 (three) times daily. 02/18/18  Yes Leslye Peer, MD  buPROPion (WELLBUTRIN XL) 300 MG 24 hr tablet Take 1 tablet (300 mg total) by mouth every morning. 11/24/18  Yes Hurst, Rosey Bath T, PA-C  escitalopram (LEXAPRO) 20 MG tablet Take 1 tablet (20 mg total) by mouth daily. 11/24/18  Yes Melony Overly T, PA-C  Ferrous Sulfate (IRON PO) Take by mouth.   Yes [provider]  fluticasone (FLONASE) 50 MCG/ACT nasal spray Place 2 sprays into both nostrils daily. 01/29/18  Yes Leslye Peer, MD  LORazepam (ATIVAN) 0.5 MG tablet Take 1 tablet (0.5 mg total) by mouth every 8 (eight) hours as needed for anxiety. 11/24/18  Yes Hurst, Teresa T, PA-C  mirabegron ER (MYRBETRIQ) 50 MG TB24 tablet Take 50 mg by mouth daily.   Yes [provider]  omeprazole (PRILOSEC) 20 MG capsule Take 1 capsule (20 mg total) by mouth daily. 04/12/19  Yes Stallings, Zoe A, MD  ondansetron (ZOFRAN) 4 MG tablet Take 1 tablet (4 mg total) by mouth every 8 (eight) hours as needed for nausea or vomiting. 11/21/17  Yes Collie Siad A, MD  pramipexole (MIRAPEX) 0.25 MG tablet Take 1 tablet (0.25 mg total) by mouth at bedtime. 02/24/19  Yes Hurst, Glade Nurse, PA-C  telmisartan-hydrochlorothiazide (MICARDIS HCT) 40-12.5 MG tablet  Take 1 tablet by mouth daily. 04/12/19  Yes Stallings, Zoe A, MD  valACYclovir (VALTREX) 1000 MG tablet TAKE 2 TABLETS BY MOUTH EVERY 12 HOURS AS NEEDED FOR ONE DAY WITH OUTBREAK 12/12/17  Yes Collie Siad A, MD  VITAMIN D PO Take by mouth.   Yes [provider]    Allergies  Allergen Reactions   Bee Venom Hives and Swelling   Enablex [Darifenacin Hydrobromide] Other (See Comments)    Phlebitis.   Erythromycin Hives   Macrobid [Nitrofurantoin Monohyd Macro] Nausea And Vomiting   Metronidazole     GI upset with tablet form   Nitrofuran Derivatives Nausea And Vomiting   Risperidone And Related Swelling    Patient Active Problem List   Diagnosis Date Noted   Seasonal allergic rhinitis 10/09/2018   Major depressive disorder, recurrent episode, moderate (HCC) 04/01/2018   Hypertension 08/15/2017   Sleep apnea 08/15/2017   Morbid obesity with BMI of 40.0-44.9, adult (HCC) 08/15/2017   S/P tonsillectomy 08/11/2017   S/P total hysterectomy 09/14/2015   RLS (restless legs syndrome) 03/17/2015   Essential hypertension 05/19/2013    Past Medical History:  Diagnosis Date   Allergy    Anxiety    Depression    Elevated white blood cell count    Elevated white blood cell count 09/25/2011   GERD (gastroesophageal reflux disease)    Headache    Migraine   Hypertension  Kidney stone    Neuromuscular disorder (HCC)    carpel tunnel disease   Pneumonia 08/15/2017   Restless leg syndrome    Shortness of breath dyspnea    with exertion   Sleep apnea    does not have CPAP machine, mostly affects back sleeping was encouraged to sleep on her sides   Tonsillar calculus     Past Surgical History:  Procedure Laterality Date   COLONOSCOPY     DILATION AND CURETTAGE OF UTERUS  1993   ECTOPIC PREGNANCY SURGERY     ESSURE TUBAL LIGATION     LAPAROSCOPIC CHOLECYSTECTOMY W/ CHOLANGIOGRAPHY  02/25/2001   Dr Lindie SpruceWyatt   LITHOTRIPSY  12/22/2014    ROBOTIC ASSISTED TOTAL HYSTERECTOMY WITH SALPINGECTOMY Left 09/14/2015   Procedure: ROBOTIC ASSISTED TOTAL HYSTERECTOMY WITH LEFT SALPINGECTOMY;  Surgeon: Noland FordyceKelly Fogleman, MD;  Location: [facility];  Service: Gynecology;  Laterality: Left;   TONSILLECTOMY N/A 08/11/2017   Procedure: TONSILLECTOMY;  Surgeon: Serena Colonelosen, Jefry, MD;  Location: [facility];  Service: ENT;  Laterality: N/A;   UPPER GI ENDOSCOPY     WISDOM TOOTH EXTRACTION      Social History   Socioeconomic History   Marital status: Divorced    Spouse name: Not on file   Number of children: Not on file   Years of education: Not on file   Highest education level: Not on file  Occupational History   Not on file  Social Needs   Financial resource strain: Not on file   Food insecurity    Worry: Not on file    Inability: Not on file   Transportation needs    Medical: Not on file    Non-medical: Not on file  Tobacco Use   Smoking status: Never Smoker   Smokeless tobacco: Never Used  Substance and Sexual Activity   Alcohol use: Yes    Alcohol/week: 1.0 standard drinks    Types: 1 Cans of beer per week    Comment: occas   Drug use: No   Sexual activity: Not Currently    Partners: Male    Comment: 1ST intercourse- [id number], partners-  2   Lifestyle   Physical activity    Days per week: Not on file    Minutes per session: Not on file   Stress: Not on file  Relationships   Social connections    Talks on phone: Not on file    Gets together: Not on file    Attends religious service: Not on file    Active member of club or organization: Not on file    Attends meetings of clubs or organizations: Not on file    Relationship status: Not on file   Intimate partner violence    Fear of current or ex partner: Not on file    Emotionally abused: Not on file    Physically abused: Not on file    Forced sexual activity: Not on file  Other Topics Concern   Not on file  Social History Narrative   Not on  file    Family History  Problem Relation Age of Onset   Hypertension Mother    Heart failure Mother    Cancer Mother    Heart disease Mother    Heart disease Brother    Hypertension Brother    Cancer Father        Leukemia   Hypertension Father    Cancer Sister    Breast cancer Sister    Diabetes Other  Colon cancer Neg Hx    Esophageal cancer Neg Hx    Rectal cancer Neg Hx    Stomach cancer Neg Hx      Review of Systems  Constitutional: Negative.  Negative for chills and fever.  HENT: Negative.  Negative for congestion, sinus pain and sore throat.   Respiratory: Negative.  Negative for cough and shortness of breath.   Cardiovascular: Negative.  Negative for chest pain and palpitations.  Gastrointestinal: Negative.  Negative for abdominal pain, diarrhea, nausea and vomiting.  Musculoskeletal: Negative for myalgias.  Skin: Negative.  Negative for rash.  Neurological: Negative.  Negative for dizziness and headaches.  All other systems reviewed and are negative.  Today's Vitals   04/12/19 1135 04/12/19 1136  BP: (!) 142/85 (!) 143/94  Pulse: 85   Temp: 98.1 F (36.7 C)   SpO2: 97%   Weight: 207 lb (93.9 kg)   Height: 5' (1.524 m)    Body mass index is 40.43 kg/m.   Physical Exam Vitals signs reviewed.  Constitutional:      Appearance: Normal appearance.  HENT:     Head: Normocephalic.     Mouth/Throat:     Mouth: Mucous membranes are moist.     Pharynx: Oropharynx is clear. Posterior oropharyngeal erythema present. No oropharyngeal exudate.     Comments: Erythema left lower gums Eyes:     Extraocular Movements: Extraocular movements intact.     Conjunctiva/sclera: Conjunctivae normal.     Pupils: Pupils are equal, round, and reactive to light.  Neck:     Musculoskeletal: Normal range of motion and neck supple.  Cardiovascular:     Rate and Rhythm: Normal rate and regular rhythm.  Pulmonary:     Effort: Pulmonary effort is normal.    Musculoskeletal: Normal range of motion.  Lymphadenopathy:     Head:     Left side of head: Submental and submandibular adenopathy present.     Cervical: Cervical adenopathy present.  Skin:    General: Skin is warm and dry.     Capillary Refill: Capillary refill takes less than 2 seconds.  Neurological:     General: No focal deficit present.     Mental Status: She is alert and oriented to person, place, and time.      ASSESSMENT & PLAN: Brenetta was seen today for thrust.  Diagnoses and all orders for this visit:  Infection of mouth -     amoxicillin-clavulanate (AUGMENTIN) 875-125 MG tablet; Take 1 tablet by mouth 2 (two) times daily for 7 days. -     fluconazole (DIFLUCAN) 150 MG tablet; Take 1 tablet (150 mg total) by mouth once for 1 dose.  Other orders -     telmisartan-hydrochlorothiazide (MICARDIS HCT) 40-12.5 MG tablet; Take 1 tablet by mouth daily. -     omeprazole (PRILOSEC) 20 MG capsule; Take 1 capsule (20 mg total) by mouth daily.    Patient Instructions  Health Maintenance, Female Adopting a healthy lifestyle and getting preventive care are important in promoting health and wellness. Ask your health care provider about:  The right schedule for you to have regular tests and exams.  Things you can do on your own to prevent diseases and keep yourself healthy. What should I know about diet, weight, and exercise? Eat a healthy diet   Eat a diet that includes plenty of vegetables, fruits, low-fat dairy products, and lean protein.  Do not eat a lot of foods that are high in solid fats, added sugars,  or sodium. Maintain a healthy weight Body mass index (BMI) is used to identify weight problems. It estimates body fat based on height and weight. Your health care provider can help determine your BMI and help you achieve or maintain a healthy weight. Get regular exercise Get regular exercise. This is one of the most important things you can do for your health. Most  adults should:  Exercise for at least 150 minutes each week. The exercise should increase your heart rate and make you sweat (moderate-intensity exercise).  Do strengthening exercises at least twice a week. This is in addition to the moderate-intensity exercise.  Spend less time sitting. Even light physical activity can be beneficial. Watch cholesterol and blood lipids Have your blood tested for lipids and cholesterol at 48 years of age, then have this test every 5 years. Have your cholesterol levels checked more often if:  Your lipid or cholesterol levels are high.  You are older than 49 years of age.  You are at high risk for heart disease. What should I know about cancer screening? Depending on your health history and family history, you may need to have cancer screening at various ages. This may include screening for:  Breast cancer.  Cervical cancer.  Colorectal cancer.  Skin cancer.  Lung cancer. What should I know about heart disease, diabetes, and high blood pressure? Blood pressure and heart disease  High blood pressure causes heart disease and increases the risk of stroke. This is more likely to develop in people who have high blood pressure readings, are of African descent, or are overweight.  Have your blood pressure checked: ? Every 3-5 years if you are 77-94 years of age. ? Every year if you are 54 years old or older. Diabetes Have regular diabetes screenings. This checks your fasting blood sugar level. Have the screening done:  Once every three years after age 51 if you are at a normal weight and have a low risk for diabetes.  More often and at a younger age if you are overweight or have a high risk for diabetes. What should I know about preventing infection? Hepatitis B If you have a higher risk for hepatitis B, you should be screened for this virus. Talk with your health care provider to find out if you are at risk for hepatitis B infection. Hepatitis  C Testing is recommended for:  Everyone born from [birth year] through 1966.  Anyone with known risk factors for hepatitis C. Sexually transmitted infections (STIs)  Get screened for STIs, including gonorrhea and chlamydia, if: ? You are sexually active and are younger than 49 years of age. ? You are older than 49 years of age and your health care provider tells you that you are at risk for this type of infection. ? Your sexual activity has changed since you were last screened, and you are at increased risk for chlamydia or gonorrhea. Ask your health care provider if you are at risk.  Ask your health care provider about whether you are at high risk for HIV. Your health care provider may recommend a prescription medicine to help prevent HIV infection. If you choose to take medicine to prevent HIV, you should first get tested for HIV. You should then be tested every 3 months for as long as you are taking the medicine. Pregnancy  If you are about to stop having your period (premenopausal) and you may become pregnant, seek counseling before you get pregnant.  Take 400 to 800 micrograms (mcg)  of folic acid every day if you become pregnant.  Ask for birth control (contraception) if you want to prevent pregnancy. Osteoporosis and menopause Osteoporosis is a disease in which the bones lose minerals and strength with aging. This can result in bone fractures. If you are 57 years old or older, or if you are at risk for osteoporosis and fractures, ask your health care provider if you should:  Be screened for bone loss.  Take a calcium or vitamin D supplement to lower your risk of fractures.  Be given hormone replacement therapy (HRT) to treat symptoms of menopause. Follow these instructions at home: Lifestyle  Do not use any products that contain nicotine or tobacco, such as cigarettes, e-cigarettes, and chewing tobacco. If you need help quitting, ask your health care provider.  Do not use street  drugs.  Do not share needles.  Ask your health care provider for help if you need support or information about quitting drugs. Alcohol use  Do not drink alcohol if: ? Your health care provider tells you not to drink. ? You are pregnant, may be pregnant, or are planning to become pregnant.  If you drink alcohol: ? Limit how much you use to 0-1 drink a day. ? Limit intake if you are breastfeeding.  Be aware of how much alcohol is in your drink. In the [place] one drink equals one 12 oz bottle of beer (355 mL), one 5 oz glass of wine (148 mL), or one 1 oz glass of hard liquor (44 mL). General instructions  Schedule regular health, dental, and eye exams.  Stay current with your vaccines.  Tell your health care provider if: ? You often feel depressed. ? You have ever been abused or do not feel safe at home. Summary  Adopting a healthy lifestyle and getting preventive care are important in promoting health and wellness.  Follow your health care provider's instructions about healthy diet, exercising, and getting tested or screened for diseases.  Follow your health care provider's instructions on monitoring your cholesterol and blood pressure. This information is not intended to replace advice given to you by your health care provider. Make sure you discuss any questions you have with your health care provider. Document Released: 12/17/2010 Document Revised: 05/27/2018 Document Reviewed: 05/27/2018 Elsevier Patient Education  2020 Elsevier Inc.      Edwina Barth, MD [facility]

## 2019-04-12 NOTE — Patient Instructions (Signed)
Health Maintenance, Female Adopting a healthy lifestyle and getting preventive care are important in promoting health and wellness. Ask your health care provider about:  The right schedule for you to have regular tests and exams.  Things you can do on your own to prevent diseases and keep yourself healthy. What should I know about diet, weight, and exercise? Eat a healthy diet   Eat a diet that includes plenty of vegetables, fruits, low-fat dairy products, and lean protein.  Do not eat a lot of foods that are high in solid fats, added sugars, or sodium. Maintain a healthy weight Body mass index (BMI) is used to identify weight problems. It estimates body fat based on height and weight. Your health care provider can help determine your BMI and help you achieve or maintain a healthy weight. Get regular exercise Get regular exercise. This is one of the most important things you can do for your health. Most adults should:  Exercise for at least 150 minutes each week. The exercise should increase your heart rate and make you sweat (moderate-intensity exercise).  Do strengthening exercises at least twice a week. This is in addition to the moderate-intensity exercise.  Spend less time sitting. Even light physical activity can be beneficial. Watch cholesterol and blood lipids Have your blood tested for lipids and cholesterol at 48 years of age, then have this test every 5 years. Have your cholesterol levels checked more often if:  Your lipid or cholesterol levels are high.  You are older than 49 years of age.  You are at high risk for heart disease. What should I know about cancer screening? Depending on your health history and family history, you may need to have cancer screening at various ages. This may include screening for:  Breast cancer.  Cervical cancer.  Colorectal cancer.  Skin cancer.  Lung cancer. What should I know about heart disease, diabetes, and high blood  pressure? Blood pressure and heart disease  High blood pressure causes heart disease and increases the risk of stroke. This is more likely to develop in people who have high blood pressure readings, are of African descent, or are overweight.  Have your blood pressure checked: ? Every 3-5 years if you are 18-39 years of age. ? Every year if you are 40 years old or older. Diabetes Have regular diabetes screenings. This checks your fasting blood sugar level. Have the screening done:  Once every three years after age 40 if you are at a normal weight and have a low risk for diabetes.  More often and at a younger age if you are overweight or have a high risk for diabetes. What should I know about preventing infection? Hepatitis B If you have a higher risk for hepatitis B, you should be screened for this virus. Talk with your health care provider to find out if you are at risk for hepatitis B infection. Hepatitis C Testing is recommended for:  Everyone born from 1945 through 1965.  Anyone with known risk factors for hepatitis C. Sexually transmitted infections (STIs)  Get screened for STIs, including gonorrhea and chlamydia, if: ? You are sexually active and are younger than 49 years of age. ? You are older than 49 years of age and your health care provider tells you that you are at risk for this type of infection. ? Your sexual activity has changed since you were last screened, and you are at increased risk for chlamydia or gonorrhea. Ask your health care provider if   you are at risk.  Ask your health care provider about whether you are at high risk for HIV. Your health care provider may recommend a prescription medicine to help prevent HIV infection. If you choose to take medicine to prevent HIV, you should first get tested for HIV. You should then be tested every 3 months for as long as you are taking the medicine. Pregnancy  If you are about to stop having your period (premenopausal) and  you may become pregnant, seek counseling before you get pregnant.  Take 400 to 800 micrograms (mcg) of folic acid every day if you become pregnant.  Ask for birth control (contraception) if you want to prevent pregnancy. Osteoporosis and menopause Osteoporosis is a disease in which the bones lose minerals and strength with aging. This can result in bone fractures. If you are 65 years old or older, or if you are at risk for osteoporosis and fractures, ask your health care provider if you should:  Be screened for bone loss.  Take a calcium or vitamin D supplement to lower your risk of fractures.  Be given hormone replacement therapy (HRT) to treat symptoms of menopause. Follow these instructions at home: Lifestyle  Do not use any products that contain nicotine or tobacco, such as cigarettes, e-cigarettes, and chewing tobacco. If you need help quitting, ask your health care provider.  Do not use street drugs.  Do not share needles.  Ask your health care provider for help if you need support or information about quitting drugs. Alcohol use  Do not drink alcohol if: ? Your health care provider tells you not to drink. ? You are pregnant, may be pregnant, or are planning to become pregnant.  If you drink alcohol: ? Limit how much you use to 0-1 drink a day. ? Limit intake if you are breastfeeding.  Be aware of how much alcohol is in your drink. In the [place] one drink equals one 12 oz bottle of beer (355 mL), one 5 oz glass of wine (148 mL), or one 1 oz glass of hard liquor (44 mL). General instructions  Schedule regular health, dental, and eye exams.  Stay current with your vaccines.  Tell your health care provider if: ? You often feel depressed. ? You have ever been abused or do not feel safe at home. Summary  Adopting a healthy lifestyle and getting preventive care are important in promoting health and wellness.  Follow your health care provider's instructions about healthy  diet, exercising, and getting tested or screened for diseases.  Follow your health care provider's instructions on monitoring your cholesterol and blood pressure. This information is not intended to replace advice given to you by your health care provider. Make sure you discuss any questions you have with your health care provider. Document Released: 12/17/2010 Document Revised: 05/27/2018 Document Reviewed: 05/27/2018 Elsevier Patient Education  2020 Elsevier Inc.  

## 2019-04-13 ENCOUNTER — Ambulatory Visit: Payer: 59 | Admitting: Psychiatry

## 2019-04-15 ENCOUNTER — Telehealth: Payer: Self-pay | Admitting: Emergency Medicine

## 2019-04-15 NOTE — Telephone Encounter (Signed)
Pt requesting medication change says that the following antibiotic is not working amoxicillin-clavulanate (AUGMENTIN) 875-125 MG tablet [[id number]]   Please advise

## 2019-04-19 ENCOUNTER — Telehealth: Payer: Self-pay | Admitting: *Deleted

## 2019-04-19 NOTE — Telephone Encounter (Signed)
Called patient because it was a message from the patient the antibiotic Augmentin was not working. Patient states she took the last tablet yesterday. I advised the patient I spoke to the doctor and he suggest to schedule a follow up appointment. I advised the patient the appointment can be scheduled with Dr Mitchel Honour or her PCP Dr Nolon Rod.

## 2019-04-19 NOTE — Telephone Encounter (Signed)
Pt called in to check up on prescription change. Please advise.

## 2019-04-20 NOTE — Telephone Encounter (Signed)
LVM to call office back about Rx request.  

## 2019-04-23 ENCOUNTER — Other Ambulatory Visit: Payer: Self-pay | Admitting: Physician Assistant

## 2019-04-27 ENCOUNTER — Other Ambulatory Visit: Payer: Self-pay

## 2019-04-27 ENCOUNTER — Ambulatory Visit (INDEPENDENT_AMBULATORY_CARE_PROVIDER_SITE_OTHER): Payer: 59 | Admitting: Psychiatry

## 2019-04-27 DIAGNOSIS — F411 Generalized anxiety disorder: Secondary | ICD-10-CM

## 2019-04-27 NOTE — Progress Notes (Signed)
[facility] Counselor/Therapist Progress Note  Patient ID: Helen Greene, MRN: [id number],    Date: 04/27/2019  Time Spent: 60 minutes  Treatment Type: Individual Therapy  Reported Symptoms:  Anxiety, frustration, irritable   Mental Status Exam:  Appearance:   Casual     Behavior:  Appropriate and Sharing  Motor:  Normal  Speech/Language:   Normal Rate  Affect:  anxious, frustration  Mood:  anxious and frustration  Thought process:  goal directed  Thought content:    WNL  Sensory/Perceptual disturbances:    WNL  Orientation:  oriented to person, place, time/date, situation, day of week, month of year and year  Attention:  Good  Concentration:  Good  Memory:  WNL  Fund of knowledge:   Good  Insight:    Good  Judgment:   Fair  Impulse Control:  Good   Risk Assessment: Danger to Self:  No Self-injurious Behavior: No Danger to Others: No Duty to Warn:no Physical Aggression / Violence:No  Access to Firearms a concern: No  Gang Involvement:No   Subjective: Patient in today reporting continuing to work on her therapy goals and making some progress.  Has gotten out of the house more, is working from home full time.  Went to church Sunday, felt connected to people there and that felt good.  Some frustration and irritability with work situations/people.  Room-mate has returned from [place]. Is needing to set appropriate boundaries with room-mate per discussion today.  (not all info on note due to patient privacy needs.)   Interventions: Cognitive Behavioral Therapy and Solution-Oriented/Positive Psychology  Diagnosis:   ICD-10-CM   1. Generalized anxiety disorder  F41.1     Plan: Patient not signing tx plan on computer screen due to COVID.  Treatment Goals:Goals remain on tx plan as patient works on strategies  to meet her goals. Progress will be documented each session in "Progress" section on plan.   Long term goal: Develop the ability to recognize, accept,  and cope with feelings of anxiety and  depression.   Short term goal: Identify and replace anxious and depressive thinking that leads to anxious or depressive feelings and actions.  Strategy: Reinforce patient's positive, reality based cognitive messages that enhance self-confidence and increase adaptive behaviors/action.  Progress: Patient reports progress on her goals, especially in working on having more positive and realistic thoughts that can lead to improved self-confidence and boundaries with other people.  Is working on this and "it doesn't come naturally." She was able to give 2 examples today where she has set a better boundary with others. There is a pending situation that she wants to be able to handle re: setting a needed boundary, but has not felt she was strong enough.  We roleplayed that one in session today, offering patient a couple different ways of communicating boundary with other person and she seemed to feel some more empowered.  Patient has history of allowing others to take advantage of her and then feel hurt and depressed. Patient beginning to actively take steps to set boundaries with others, although uncomfortable as "I never feel comfortable standing up for myself." Talked through this issue in session today and patient seemed to feel stronger by session end. Still focusing on what's in her control and what is not.  Also discussed some work situations with a particular co-worker and came up with some ways to better manage the communication issues with her. Goal review and progress noted.  Next appt within 2-3 weeks.   Shanon Ace, LCSW

## 2019-05-15 IMAGING — CT CT ANGIO CHEST
2 of 6 series · 18 of 36 positions shown · IV contrast (iopamidol)
Comparison: Chest radiograph performed 11/12/2017, and CT of the
chest performed 04/21/2012

ADDENDUM:
In comparison to neck CT of 08/15/2017, there is a dense RIGHT upper
lobe pneumonia present on 08/15/2017. Therefore, the mild airspace
disease/ground-glass density in the RIGHT upper lobe on CT of
11/13/2017 is most consistent with resolving pneumonia.

Initial dictation by Dr. Auntyjatty.  Addendum by Dr. Armijos
Findings conveyed [place], Per-[place]gil on 01/06/2018  at[DATE].
CLINICAL DATA: Chronic shortness of breath and leg swelling.
Generalized fatigue.
EXAM:
CT ANGIOGRAPHY CHEST WITH CONTRAST
TECHNIQUE: Multidetector CT imaging of the chest was performed using the
standard protocol during bolus administration of intravenous
contrast. Multiplanar CT image reconstructions and MIPs were
obtained to evaluate the vascular anatomy.
CONTRAST:  100 mL [id number] IOPAMIDOL ([id number]-[id number]) INJECTION 76%

[Series 7: pe thins · axial · 0.62mm/px · z∈[+1085,+1287]mm · 17 of 228 slices shown]
[im 13/228  lung]
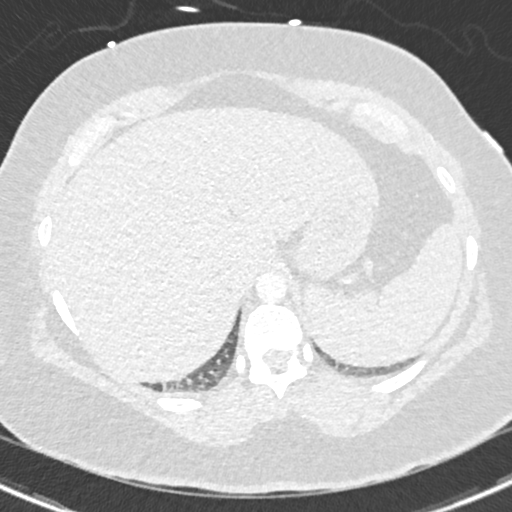
[im 26/228  mediastinal]
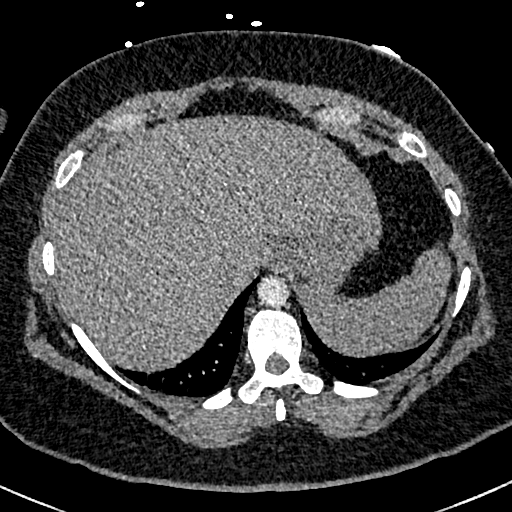
[im 38/228  lung]
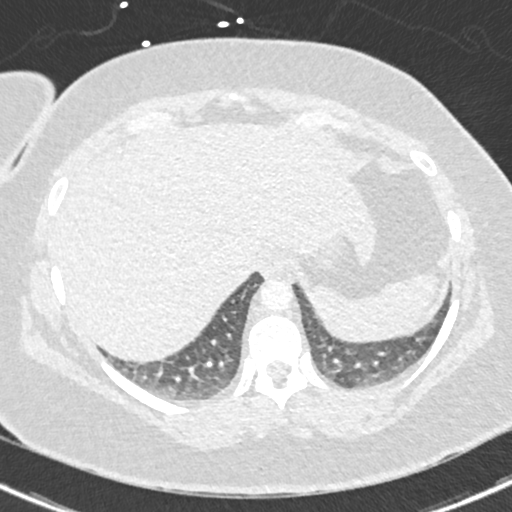
[im 51/228  mediastinal]
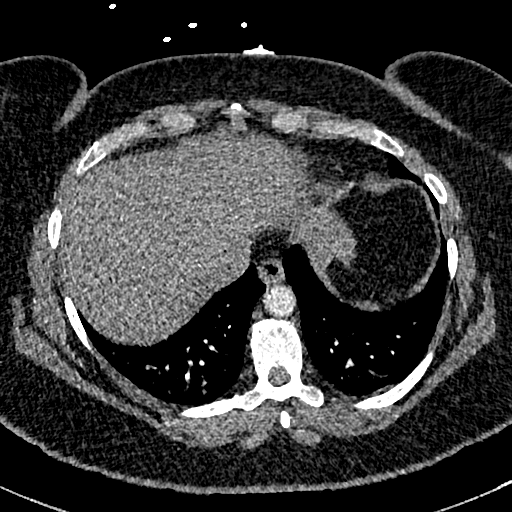
[im 64/228  lung]
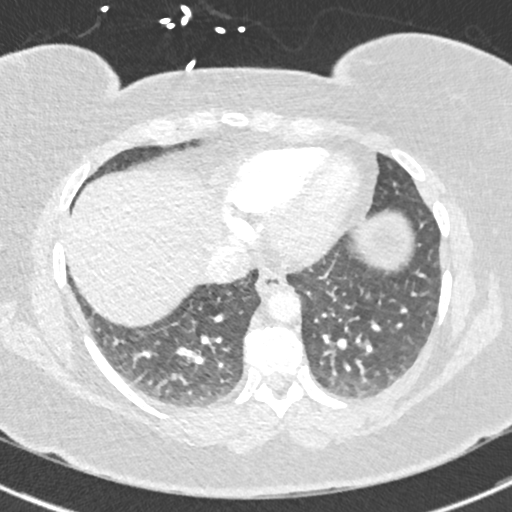
[im 76/228  mediastinal]
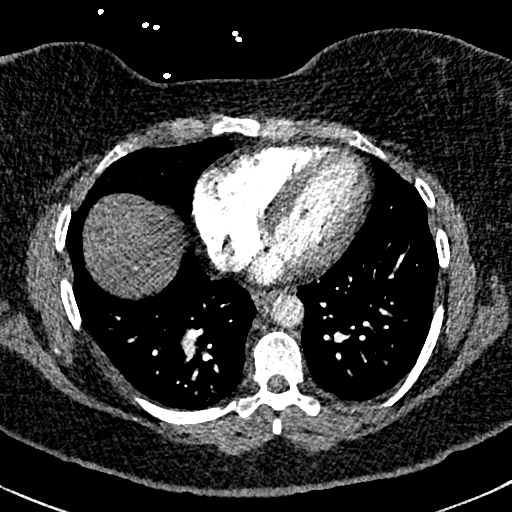
[im 89/228  lung]
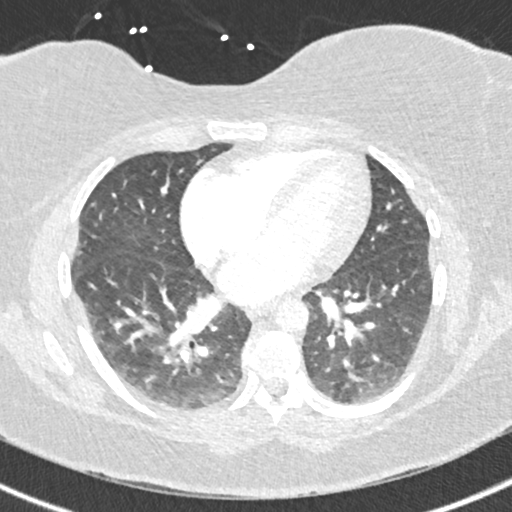
[im 101/228  mediastinal]
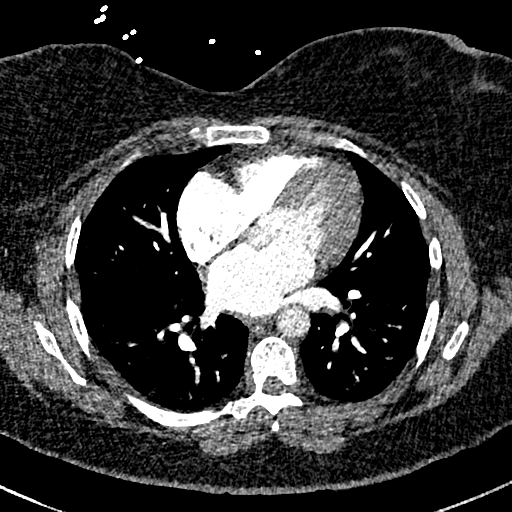
[im 114/228  lung]
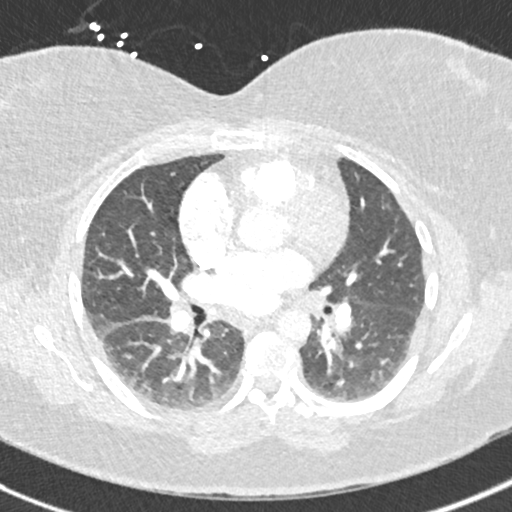
[im 127/228  mediastinal]
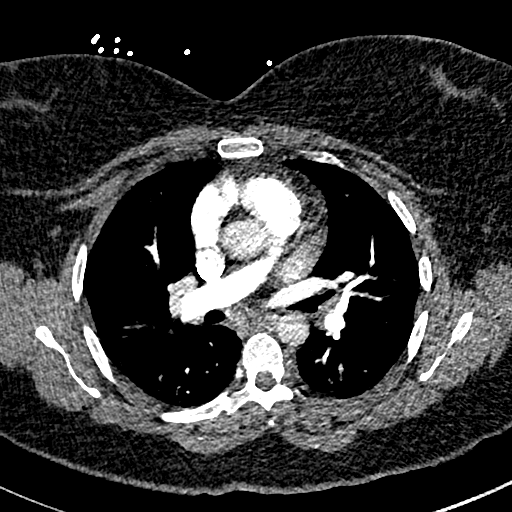
[im 139/228  lung]
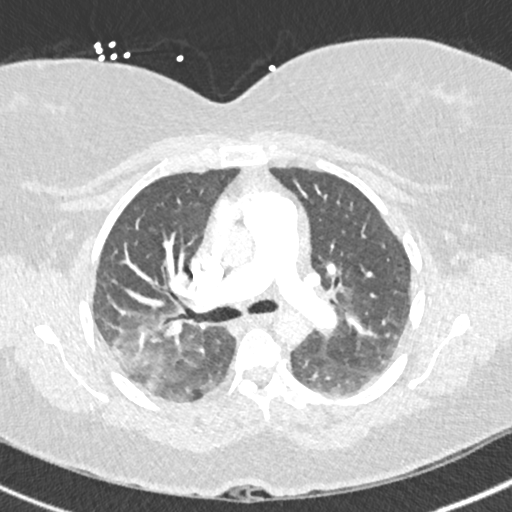
[im 152/228  mediastinal]
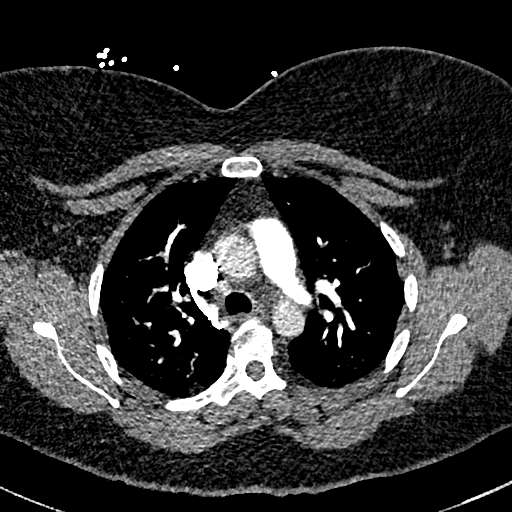
[im 164/228  lung]
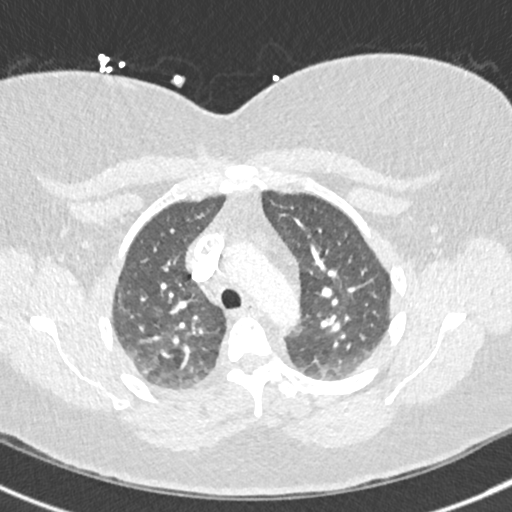
[im 177/228  mediastinal]
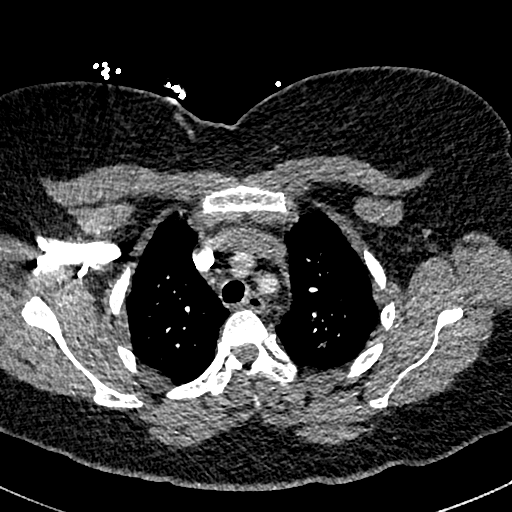
[im 190/228  lung]
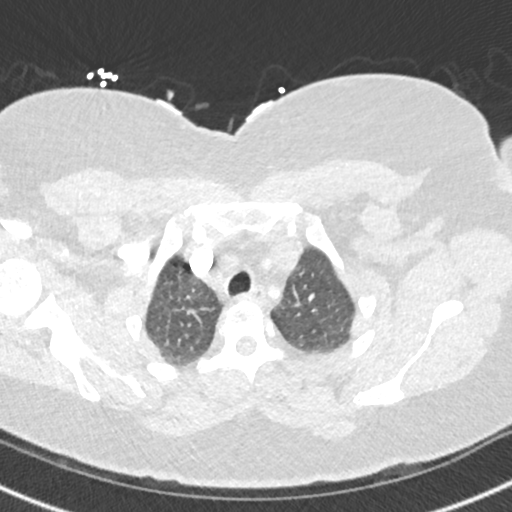
[im 202/228  mediastinal]
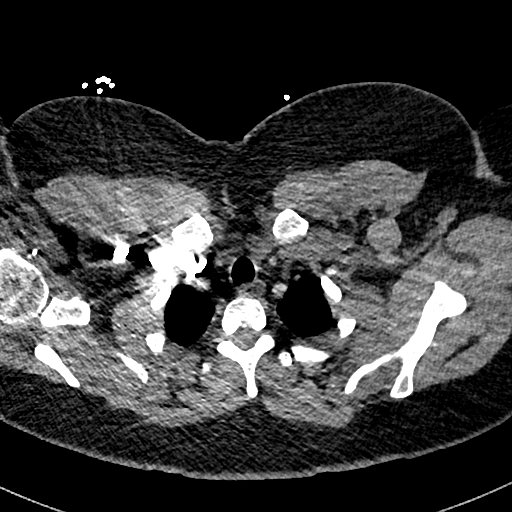
[im 215/228  lung]
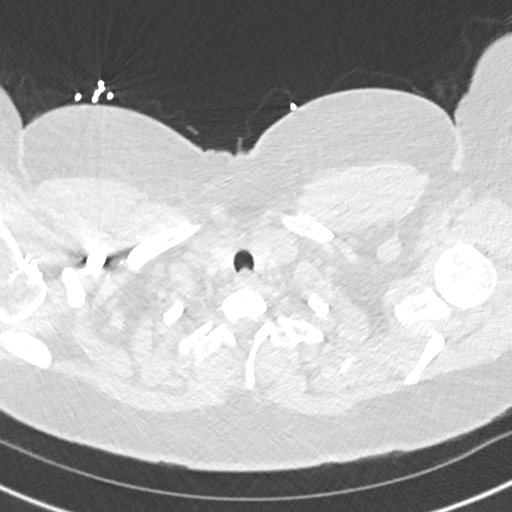

[Series 8: pe 2mm cor · coronal · 0.46mm/px · 1 of 115 slices shown]
[im 58/115  mediastinal]
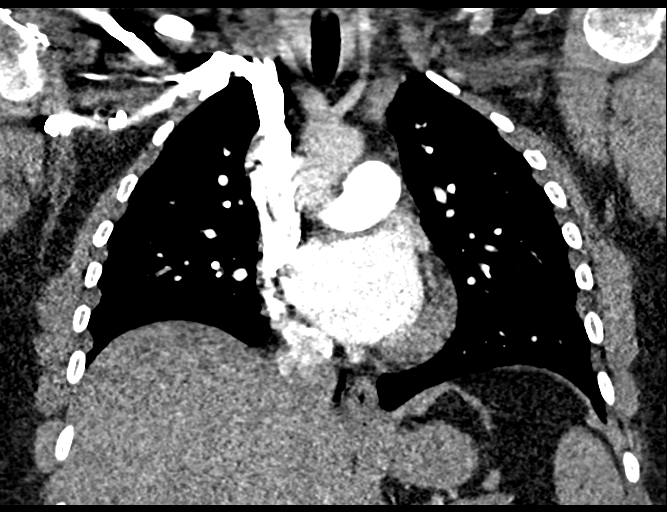

[18 of 36 positions shown; findings below may reference images not displayed]

FINDINGS: Cardiovascular: There is no evidence of significant pulmonary
embolus.

The heart is normal in size. The thoracic aorta is grossly
unremarkable. The great vessels are within normal limits.

Mediastinum/Nodes: No mediastinal lymphadenopathy is seen. No
pericardial effusion is identified. Visualized peribronchial nodes
remain borderline normal in size. The visualized portions of the
thyroid gland are unremarkable. No axillary lymphadenopathy is seen.

Lungs/Pleura: Mild hazy opacity is noted at the posterior aspect of
the right upper lobe, raising question for pneumonia. Small pleural
based nodules are noted at the right middle lobe, measuring up to 5
mm in size (image 63 of 114). No pleural effusion or pneumothorax is
seen.

Upper Abdomen: The visualized portions of the liver and spleen are
unremarkable.

Musculoskeletal: No acute osseous abnormalities are identified. The
visualized musculature is unremarkable in appearance.

Review of the MIP images confirms the above findings.
IMPRESSION: 1. No evidence of significant pulmonary embolus.
2. Mild hazy opacity at the posterior aspect of the right upper lung
lobe, raising question for pneumonia.
3. Small pleural based nodules at the right middle lobe, measuring
up to 5 mm in size. No follow-up needed if patient is low-risk (and
has no known or suspected primary neoplasm). Non-contrast chest CT
can be considered in 12 months if patient is high-risk. This
recommendation follows the consensus statement: Guidelines for
Management of Incidental Pulmonary Nodules Detected on CT Images:

## 2019-05-27 ENCOUNTER — Ambulatory Visit (INDEPENDENT_AMBULATORY_CARE_PROVIDER_SITE_OTHER): Payer: 59 | Admitting: Psychiatry

## 2019-05-27 ENCOUNTER — Other Ambulatory Visit: Payer: Self-pay

## 2019-05-27 ENCOUNTER — Ambulatory Visit: Payer: 59 | Admitting: Physician Assistant

## 2019-05-27 ENCOUNTER — Ambulatory Visit: Payer: 59 | Admitting: Psychiatry

## 2019-05-27 DIAGNOSIS — F411 Generalized anxiety disorder: Secondary | ICD-10-CM

## 2019-05-27 NOTE — Progress Notes (Signed)
      [facility] Counselor/Therapist Progress Note  Patient ID: Helen Greene, MRN: [id number],    Date: 05/27/2019  Time Spent: 60 minutes   2:00pm to 3:00pm  Treatment Type: Individual Therapy  Reported Symptoms:  Anxiety, depression, self esteem issues  Mental Status Exam:  Appearance:   Casual     Behavior:  Appropriate and Sharing  Motor:  Normal  Speech/Language:   Normal Rate  Affect:  anxious, depressed  Mood:  anxious and depressed  Thought process:  goal directed  Thought content:    WNL  Sensory/Perceptual disturbances:    WNL  Orientation:  oriented to person, place, time/date, situation, day of week, month of year and year  Attention:  Good  Concentration:  Good  Memory:  WNL "some forgetfulness but better now"  Fund of knowledge:   Good  Insight:    Good  Judgment:   Good  Impulse Control:  Good   Risk Assessment: Danger to Self:  No Self-injurious Behavior: No Danger to Others: No Duty to Warn:no Physical Aggression / Violence:No  Access to Firearms a concern: No  Gang Involvement:No   Subjective:  Patient in today reporting anxiety and some depression. Mood sometimes has been more stable.  Room-mate is back and she is needing to speak with him about him sharing living expenses so patient is not having to pay it all.  Hard for her to ask him to pay some to contribute to household expenses but it is more stressful for her to cover all expenses.  Interventions: Cognitive Behavioral Therapy and Solution-Oriented/Positive Psychology  Diagnosis:   ICD-10-CM   1. Generalized anxiety disorder  F41.1      Plan: Patient not signing tx plan on computer screen due to [place].  Treatment Goals:Goals remain on tx plan as patient works on strategies  to meet her goals. Progress will be documented each session in "Progress" section on plan.   Long term goal: Develop the ability to recognize, accept, and cope with feelings ofanxiety anddepression.    Short term goal: Identify and replaceanxious anddepressive thinking that leads toanxious ordepressive feelings and actions.  Strategy: Reinforce patient's positive, reality based cognitive messages that enhance self-confidence and increase adaptive behaviors/action.  Progress: Patient today working on above Strategy relating to enhancing her self-esteem and self-confidence which can help her take more steps in taking care of herself through assertive actions.  Patient  Talked through some history of self-esteem struggles and precipitating factors involved.  She is working on developing a healthier, more positive sense of self which will hopefully lead to greater self-confidence as well.  Is being more mindful of her self-talk being negative, which we worked on today with replacement messages, as well as viewing herself in a more positive light with greater self-acceptance.  She responded well although it is obviously a different way of looking at herself but one that she wants to continue working on, and plans to do this between appts also.  Discussed some of our work from last session that is closely linked with self-esteem and self-confidence. Goal review and progress noted with patient.    Next appt within 2 weeks.   Shanon Ace, LCSW

## 2019-06-17 ENCOUNTER — Ambulatory Visit (INDEPENDENT_AMBULATORY_CARE_PROVIDER_SITE_OTHER): Payer: 59 | Admitting: Psychiatry

## 2019-06-17 ENCOUNTER — Other Ambulatory Visit: Payer: Self-pay

## 2019-06-17 DIAGNOSIS — F411 Generalized anxiety disorder: Secondary | ICD-10-CM | POA: Diagnosis not present

## 2019-06-17 NOTE — Progress Notes (Signed)
      [facility] Counselor/Therapist Progress Note  Patient ID: Helen Greene, MRN: [id number],    Date: 06/17/2019  Time Spent: 60 minutes  11:00am to 12:00noon  Treatment Type: Individual Therapy  Reported Symptoms: anxiety, frustration  Mental Status Exam:  Appearance:   Casual     Behavior:  Appropriate and Sharing  Motor:  Normal  Speech/Language:   Normal Rate  Affect:  anxiety  Mood:  anxious  Thought process:  goal directed  Thought content:    WNL  Sensory/Perceptual disturbances:    WNL  Orientation:  oriented to person, place, time/date, situation, day of week, month of year and year  Attention:  Good  Concentration:  Good  Memory:  WNL  Fund of knowledge:   Good  Insight:    Good  Judgment:   Good  Impulse Control:  Good   Risk Assessment: Danger to Self:  No Self-injurious Behavior: No Danger to Others: No Duty to Warn:no Physical Aggression / Violence:No  Access to Firearms a concern: No  Gang Involvement:No   Subjective:  Patient in today reporting some continued anxiety but is "doing better in managing it."  Work is going better and 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  Interventions: Cognitive Behavioral Therapy and Solution-Oriented/Positive Psychology  Diagnosis:   ICD-10-CM   1. Generalized anxiety disorder  F41.1      Plan: Patient not signing tx plan on computer screen due to [place].  Treatment Goals:Goals remain on tx plan as patient works on strategies  to meet her goals. Progress will be documented each session in "Progress" section on plan.   Long term goal: Develop the ability to recognize, accept, and cope with feelings ofanxiety anddepression.   Short term goal: Identify and replaceanxious anddepressive thinking that leads toanxious ordepressive feelings and actions.  Strategy: Reinforce patient's positive, reality based cognitive messages  that enhance self-confidence and increase adaptive behaviors/action.  Progress: Worked further today on her self-esteem and self-confidence especially in terms of relationships.  Looked at healthy vs unhealthy relationships, compared to some difficulties she is having currently and affecting her emotionally. Continued work needed on setting healthy boundaries and being assertive when needed. Tends to view being assertive "as being harsh or unfriendly" versus a good characteristic trait.  Talked further today about history and current things that have influenced her self-esteem, looking at ways to elevate self-esteem and her feel more confident.   To work on increasing positive self-talk, stopping negative messages, and feeling more self-accepting.  She understands the need and is committed to working on these in between sessions and being able to report out at next session her success or lack of success with strategies outside of therapy office.  Goal review and progress/challenges/efforts noted with patient.     Next appt within 3 weeks.   Shanon Ace, LCSW

## 2019-06-28 ENCOUNTER — Ambulatory Visit (INDEPENDENT_AMBULATORY_CARE_PROVIDER_SITE_OTHER): Payer: 59 | Admitting: Psychiatry

## 2019-06-28 ENCOUNTER — Other Ambulatory Visit: Payer: Self-pay

## 2019-06-28 DIAGNOSIS — F411 Generalized anxiety disorder: Secondary | ICD-10-CM

## 2019-06-28 NOTE — Progress Notes (Signed)
      [facility] Counselor/Therapist Progress Note  Patient ID: Helen Greene, MRN: [id number],    Date: 06/28/2019  Time Spent: 60 minutes   5:00pm to 6:00pm  Treatment Type: Individual Therapy  Reported Symptoms: anxiety   Mental Status Exam:  Appearance:   Casual     Behavior:  Appropriate and Sharing  Motor:  Normal  Speech/Language:   Normal Rate  Affect:  anxiety  Mood:  anxious  Thought process:  normal  Thought content:    WNL  Sensory/Perceptual disturbances:    WNL  Orientation:  oriented to person, place, time/date, situation, day of week, month of year and year  Attention:  Good  Concentration:  Good  Memory:  WNL  Fund of knowledge:   Good  Insight:    Good  Judgment:   Good  Impulse Control:  Fair (patient reports Fair)   Risk Assessment: Danger to Self:  No Self-injurious Behavior: No Danger to Others: No Duty to Warn:no Physical Aggression / Violence:No  Access to Firearms a concern: No  Gang Involvement:No   Subjective:  Patient today reporting anxiety, within closer "friendship", the whole virus and world situation with chaos and violence.   Interventions: Solution-Oriented/Positive Psychology and Ego-Supportive  Diagnosis:   ICD-10-CM   1. Generalized anxiety disorder  F41.1     Plan: Patient not signing tx plan on computer screen due to COVID.  Treatment Goals:Goals remain on tx plan as patient works on strategies  to meet her goals. Progress will be documented each session in "Progress" section on plan.   Long term goal: Develop the ability to recognize, accept, and cope with feelings ofanxiety anddepression.   Short term goal: Identify and replaceanxious anddepressive thinking that leads toanxious ordepressive feelings and actions.  Strategy: Reinforce patient's positive, reality based cognitive messages that enhance self-confidence and increase adaptive behaviors/action.  Progress: Patient in today reporting  heightened anxiety, especially with the continuing pandemic, and world violence.  Talked through her concerns on this and was tearful throughout most of it.  Also brought up issues of unresolved grief of her mother's death, and how her mom died right after patient had visited her. Patient did good job of sharing a lot of feelings she had held inside. Thinks the "bleakness" from the ongoing pandemic and all the negativity in the world may have contributed to patient  thinking about her mom and being in touch with her sadness.  Was able to re-frame her thinking some to include how grateful she is that she had that last visit with her mother. States towards end of session that she had really been needing to vent her feelings about missing her mother and some of the occasional sadness she still has. Briefly reviewed self-esteem work from last time before session end today.  Also shared about her progress at work and has been recognized for it.  Goal review and progress noted with patient.    Next appt within 2-3 weeks.    Mathis Fare, LCSW

## 2019-07-06 ENCOUNTER — Other Ambulatory Visit: Payer: Self-pay | Admitting: Physician Assistant

## 2019-07-08 ENCOUNTER — Other Ambulatory Visit: Payer: Self-pay

## 2019-07-08 ENCOUNTER — Ambulatory Visit (INDEPENDENT_AMBULATORY_CARE_PROVIDER_SITE_OTHER): Payer: 59 | Admitting: Physician Assistant

## 2019-07-08 ENCOUNTER — Encounter: Payer: Self-pay | Admitting: Physician Assistant

## 2019-07-08 DIAGNOSIS — G2581 Restless legs syndrome: Secondary | ICD-10-CM

## 2019-07-08 DIAGNOSIS — F3341 Major depressive disorder, recurrent, in partial remission: Secondary | ICD-10-CM

## 2019-07-08 DIAGNOSIS — F411 Generalized anxiety disorder: Secondary | ICD-10-CM

## 2019-07-08 MED ORDER — ARIPIPRAZOLE 2 MG PO TABS: 2.0000 mg | ORAL_TABLET | Freq: Every morning | ORAL | 1 refills | Status: DC

## 2019-07-08 MED ORDER — PRAMIPEXOLE DIHYDROCHLORIDE 0.25 MG PO TABS: 0.2500 mg | ORAL_TABLET | Freq: Every day | ORAL | 1 refills | Status: DC

## 2019-07-08 MED ORDER — ESCITALOPRAM OXALATE 20 MG PO TABS: 20.0000 mg | ORAL_TABLET | Freq: Every day | ORAL | 1 refills | Status: DC

## 2019-07-08 MED ORDER — BUPROPION HCL ER (XL) 300 MG PO TB24: 300.0000 mg | ORAL_TABLET | Freq: Every morning | ORAL | 1 refills | Status: DC

## 2019-07-08 NOTE — Progress Notes (Signed)
[facility] Med Check  Patient ID: Helen Greene,  MRN: [id number]  PCP: Doristine Bosworth, MD  Date of Evaluation: 07/08/2019 Time spent:20 minutes  Chief Complaint:  Chief Complaint    Anxiety; Depression      HISTORY/CURRENT STATUS: HPI For routine med check.  Patient states she is doing very well.  The medications continue to work.  She is able to enjoy things.  Energy and motivation are good.  She does not cry easily.  Memory is stable.  She is able to concentrate and do her work well in her job.  Denies suicidal or homicidal thoughts.  The pramipexole is working well for the restless leg syndrome.  She has no problems with her legs jerking at all now.  Anxiety is well controlled with her current medications and she rarely needs the Ativan anymore.  She will occasionally take it in the evenings if she cannot get her mind to stop racing for her to be able to go to sleep.  Denies dizziness, syncope, seizures, numbness, tingling, tremor, tics, unsteady gait, slurred speech, confusion. Denies muscle or joint pain, stiffness, or dystonia.  Individual Medical History/ Review of Systems: Changes? :No    Past medications for mental health diagnoses include: Wellbutrin XL, Risperdal caused edema, Lexapro, Mirapex, Vistaril, questionably Xanax, Abilify, Lamictal, Klonopin,pramipexole for restless legs   Allergies: Bee venom, Enablex [darifenacin hydrobromide], Erythromycin, Macrobid [nitrofurantoin monohyd macro], Metronidazole, Nitrofuran derivatives, and Risperidone and related  Current Medications:  Current Outpatient Medications:  .  ARIPiprazole (ABILIFY) 2 MG tablet, Take 1 tablet (2 mg total) by mouth every morning., Disp: 90 tablet, Rfl: 1 .  buPROPion (WELLBUTRIN XL) 300 MG 24 hr tablet, Take 1 tablet (300 mg total) by mouth every morning., Disp: 90 tablet, Rfl: 1 .  escitalopram (LEXAPRO) 20 MG tablet, Take 1 tablet (20 mg total) by mouth daily., Disp: 90 tablet, Rfl:  1 .  LORazepam (ATIVAN) 0.5 MG tablet, Take 1 tablet (0.5 mg total) by mouth every 8 (eight) hours as needed for anxiety., Disp: 90 tablet, Rfl: 1 .  omeprazole (PRILOSEC) 20 MG capsule, Take 1 capsule (20 mg total) by mouth daily., Disp: 90 capsule, Rfl: 3 .  pramipexole (MIRAPEX) 0.25 MG tablet, Take 1 tablet (0.25 mg total) by mouth at bedtime., Disp: 90 tablet, Rfl: 1 .  telmisartan-hydrochlorothiazide (MICARDIS HCT) 40-12.5 MG tablet, Take 1 tablet by mouth daily., Disp: 90 tablet, Rfl: 1 .  azelastine (ASTELIN) 0.1 % nasal spray, Place 2 sprays into both nostrils 3 (three) times daily. (Patient not taking: Reported on 07/08/2019), Disp: 30 mL, Rfl: 5 .  Ferrous Sulfate (IRON PO), Take by mouth., Disp: , Rfl:  .  fluticasone (FLONASE) 50 MCG/ACT nasal spray, Place 2 sprays into both nostrils daily. (Patient not taking: Reported on 07/08/2019), Disp: 16 g, Rfl: 5 .  mirabegron ER (MYRBETRIQ) 50 MG TB24 tablet, Take 50 mg by mouth daily., Disp: , Rfl:  .  ondansetron (ZOFRAN) 4 MG tablet, Take 1 tablet (4 mg total) by mouth every 8 (eight) hours as needed for nausea or vomiting. (Patient not taking: Reported on 07/08/2019), Disp: 20 tablet, Rfl: 0 .  valACYclovir (VALTREX) 1000 MG tablet, TAKE 2 TABLETS BY MOUTH EVERY 12 HOURS AS NEEDED FOR ONE DAY WITH OUTBREAK (Patient not taking: Reported on 07/08/2019), Disp: 20 tablet, Rfl: 0 .  VITAMIN D PO, Take by mouth., Disp: , Rfl:  Medication Side Effects: none  Family Medical/ Social History: Changes? 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  General Appearance: Casual, Neat, Well Groomed and Obese  Eye Contact:  Good  Speech:  Clear and Coherent  Volume:  Normal  Mood:  Euthymic  Affect:  Appropriate  Thought Process:  Goal Directed and Descriptions of Associations: Intact  Orientation:  Full (Time, Place, and Person)   Thought Content: Logical   Suicidal Thoughts:  No  Homicidal Thoughts:  No  Memory:  WNL  Judgement:  Good  Insight:  Good  Psychomotor Activity:  Normal  Concentration:  Concentration: Good and Attention Span: Good  Recall:  Good  Fund of Knowledge: Good  Language: Good  Assets:  Desire for Improvement  ADL's:  Intact  Cognition: WNL  Prognosis:  Good  03/29/19 Glucose 92  DIAGNOSES:    ICD-10-CM   1. Recurrent major depressive disorder, in partial remission ([place])  F33.41   2. Generalized anxiety disorder  F41.1   3. Restless leg syndrome  G25.81     Receiving Psychotherapy: Yes With Rinaldo Cloud, LCSW.   RECOMMENDATIONS:  I spent 20 minutes with her. PDMP was reviewed. I am glad to see her doing so well! Continue Abilify 2 mg every morning. Continue Wellbutrin XL 300 mg, 1 p.o. every morning. Continue Lexapro 20 mg daily. Continue Ativan 0.5 mg every 8 hours as needed anxiety. Continue pramipexole 0.25 mg nightly. Continue therapy with Rinaldo Cloud, LCSW. Return in 6 months.  Donnal Moat, PA-C

## 2019-07-12 ENCOUNTER — Other Ambulatory Visit: Payer: Self-pay

## 2019-07-12 ENCOUNTER — Ambulatory Visit (INDEPENDENT_AMBULATORY_CARE_PROVIDER_SITE_OTHER): Payer: 59 | Admitting: Psychiatry

## 2019-07-12 DIAGNOSIS — F411 Generalized anxiety disorder: Secondary | ICD-10-CM

## 2019-07-12 NOTE — Progress Notes (Signed)
      [facility] Counselor/Therapist Progress Note  Patient ID: Helen Greene, MRN: [id number],    Date: 07/12/2019  Time Spent: 60 minutes  5:05pm to 6:05pm  Treatment Type: Individual Therapy  Reported Symptoms: anxiety, stressed at work with some new responsibilities in helping training others  Mental Status Exam:  Appearance:   Casual     Behavior:  Appropriate and Sharing  Motor:  Normal  Speech/Language:   Normal Rate  Affect:  anxiety  Mood:  anxious  Thought process:  goal directed  Thought content:    WNL  Sensory/Perceptual disturbances:    WNL  Orientation:  oriented to person, place, time/date, situation, day of week, month of year and year  Attention:  Good  Concentration:  Good  Memory:  WNL  Fund of knowledge:   Good  Insight:    Good  Judgment:   Good  Impulse Control:  Good usually / sometimes Fair under stress   Risk Assessment: Danger to Self:  No Self-injurious Behavior: No Danger to Others: No Duty to Warn:no Physical Aggression / Violence:No  Access to Firearms a concern: No  Gang Involvement:No   Subjective:  Patient in today feeling better after processing issues previously re: mom who is deceased. Pleased to have spent some time with teenage son yesterday.  Still "dealing with anxiety and it is some better" as "I don't get quite as anxious as I used to and my medicine is helping as well."  Interventions: Cognitive Behavioral Therapy and Solution-Oriented/Positive Psychology  Diagnosis:   ICD-10-CM   1. Generalized anxiety disorder  F41.1     Plan: Patient not signing tx plan on computer screen due to COVID.  Treatment Goals:Goals remain on tx plan as patient works on strategies  to meet her goals. Progress will be documented each session in "Progress" section on plan.   Long term goal: Develop the ability to recognize, accept, and cope with feelings ofanxiety anddepression.   Short term goal: Identify and replaceanxious  anddepressive thinking that leads toanxious ordepressive feelings and actions. Replace with more realistic and empowering thoughts.   Strategy: Reinforce patient's positive, reality based cognitive messages that enhance self-confidence and increase adaptive behaviors/action.  Progress: Patient continues goal-directed work on her anxiety, and is improving some.  Does seem happier at times.  Still working on better boundaries with some people and not allow them to take advantage of her emotionally and financially. Hard for her to set clear boundaries but feels she is progressing some with that. Discussed concerns as work that have created some stress for her, and also a desire to meet some other friends that would be good for her. Also visiting churches some to meet spiritual needs, while meeting people also. Looked at some of her anxious/depressive thoughts related to work and relationships, and how she can work to intercept them and replace with more positive, reality-based, hopeful, and empowering thoughts that do not support anxiety nor depression.  Goal review with patient and progress/challenges noted.  Next appt within 2 weeks.   Mathis Fare, LCSW

## 2019-07-26 ENCOUNTER — Other Ambulatory Visit: Payer: Self-pay

## 2019-07-26 ENCOUNTER — Ambulatory Visit (INDEPENDENT_AMBULATORY_CARE_PROVIDER_SITE_OTHER): Payer: 59 | Admitting: Psychiatry

## 2019-07-26 DIAGNOSIS — F411 Generalized anxiety disorder: Secondary | ICD-10-CM

## 2019-07-26 NOTE — Progress Notes (Signed)
      [facility] Counselor/Therapist Progress Note  Patient ID: Helen Greene, MRN: [id number],    Date: 07/26/2019  Time Spent: 60 minutes  11:00am to 12:00noon  Treatment Type: Individual Therapy  Reported Symptoms:  Anxiety, relationship issues  Mental Status Exam:  Appearance:   Neat     Behavior:  Appropriate, Sharing and Motivated  Motor:  Normal  Speech/Language:   Normal Rate  Affect:  anxious  Mood:  anxious  Thought process:  goal directed  Thought content:    WNL  Sensory/Perceptual disturbances:    WNL  Orientation:  oriented to person, place, time/date, situation, day of week, month of year and year  Attention:  Good  Concentration:  Good  Memory:  WNL  Fund of knowledge:   Good  Insight:    Good  Judgment:   Good  Impulse Control:  Good   Risk Assessment: Danger to Self:  No Self-injurious Behavior: No Danger to Others: No Duty to Warn:no Physical Aggression / Violence:No  Access to Firearms a concern: No  Gang Involvement:No   Subjective: Patient in today reporting anxiety.  States she met a "new friend" this past week but has been talking on phone or texting since then. "Not in a rush" and not scheduled a date yet.  "I know I need to move slowly."  Plans to see brother in [place] in May.  Interventions: Cognitive Behavioral Therapy and Solution-Oriented/Positive Psychology  Diagnosis:   ICD-10-CM   1. Generalized anxiety disorder  F41.1     Plan: Patient not signing tx plan on computer screen due to COVID.  Treatment Goals:Goals remain on tx plan as patient works on strategies  to meet her goals. Progress will be documented each session in "Progress" section on plan.   Long term goal: Develop the ability to recognize, accept, and cope with feelings ofanxiety anddepression.   Short term goal: Identify and replaceanxious anddepressive thinking that leads toanxious ordepressive feelings and actions. Replace with more realistic and  empowering thoughts.   Strategy: Reinforce patient's positive, reality based cognitive messages that enhance self-confidence and increase adaptive behaviors/action.  Progress: Patient in today to work further on her anxiety management, "especially in being able to slow down and not be impulsive in my decisions, especially relationships".  Discussed situation and contacts she has had lately and realizing that she needs to slow down, be more aware of how she can be and feel safe in interacting with others. Also wants to trust her thinking and "worry less about how others think".  A lot of anxious thoughts acknowledged and we worked with them to interrupt and replace them with more positive, realistic, and empowering thoughts that do not support anxiety.  She is to work on these more between appts. Reviewed boundary concerns and how to implement them with others.  Encouraged improving self-talk, and really thinking through important decisions rather than quickly jumping  to conclusions.  Goal review and progress noted with patient.   Next appt within 2-3 weeks.    , LCSW                   

## 2019-08-10 ENCOUNTER — Ambulatory Visit: Payer: 59 | Admitting: Psychiatry

## 2019-08-24 ENCOUNTER — Other Ambulatory Visit: Payer: Self-pay

## 2019-08-24 ENCOUNTER — Ambulatory Visit (INDEPENDENT_AMBULATORY_CARE_PROVIDER_SITE_OTHER): Payer: 59 | Admitting: Psychiatry

## 2019-08-24 DIAGNOSIS — F411 Generalized anxiety disorder: Secondary | ICD-10-CM | POA: Diagnosis not present

## 2019-08-24 NOTE — Progress Notes (Signed)
[facility] Counselor/Therapist Progress Note  Patient ID: [name], MRN: [id number],    Date: 08/24/2019  Time Spent: 60 minutes   5:00pm to 6:00pm  Treatment Type: Individual Therapy  Reported Symptoms: anxiety, some worrying but it has lessened, starting to be "bothered that I'm turning 49 in August of this year  Mental Status Exam:  Appearance:   Casual     Behavior:  Appropriate and Sharing  Motor:  Normal  Speech/Language:   Normal Rate  Affect:  anxious  Mood:  anxious  Thought process:  goal directed  Thought content:    WNL  Sensory/Perceptual disturbances:    WNL  Orientation:  oriented to person, place, time/date, situation, day of week, month of year and year  Attention:  Good  Concentration:  Good  Memory:  WNL; later added forgetfulness sometime when more stressed  Fund of knowledge:   Good  Insight:    Good  Judgment:   Good  Impulse Control:  Good/Fair   Risk Assessment: Danger to Self:  No Self-injurious Behavior: No Danger to Others: No Duty to Warn:no Physical Aggression / Violence:No  Access to Firearms a concern: No  Gang Involvement:No   Subjective: Patient today reports anxiety, worrying is decreasing, some sadness. Is "getting distressed over turning 49 yrs old in August of 2021.  Work is going well for her and she is getting more compliments from Education officer, environmental.   Interventions: Cognitive Behavioral Therapy and Solution-Oriented/Positive Psychology  Diagnosis:   ICD-10-CM   1. Generalized anxiety disorder  F41.1     Plan: Patient not signing tx plan on computer screen due to COVID.  Treatment Goals:Goals remain on tx plan as patient works on strategies  to meet her goals. Progress will be documented each session in "Progress" section on plan.   Long term goal: Develop the ability to recognize, accept, and cope with feelings ofanxiety anddepression.   Short term goal: Identify and replaceanxious anddepressive  thinking that leads toanxious ordepressive feelings and actions.Replace with more realistic and empowering thoughts.  Strategy: Reinforce patient's positive, reality based cognitive messages that enhance self-confidence and increase adaptive behaviors/action.  Progress: Patient in today and reports doing some better.  Anxiety persists some"but not quite as bad".  Dreads turning 49 and "I don't feel that old." Really improving at work--working from home has been a good thing for her and supervisors have given her several compliments.  Wanting to eventually purchase a small house and has begun to plan towards that , getting her debts down.  States she is doing better in not being impulsive in decisions, especially in issues she spoke about last session, and not acting too quickly in meeting new people. Trying to use good judgement and feels that is a "work in progress."  Did try working on anxious thoughts since last session (per homework), and reports she didn't have as many anxious thoughts but one of the main thoughts is about her "lack of hope for ever having a healthy relationship".  Challenged that some today and she was able to replace that thought with a more positive, reality-based, and empowering thought that does not support anxiety/depression/negativitiy.  To continue self-monitoring her thoughts and working to replace the anxious ones.  Also to make her notes for next session and include goal-related info, and "positives and negatives".   Goal review and progress noted.   Next appt within 3-4 weeks.   Mathis Fare, LCSW

## 2019-09-07 ENCOUNTER — Other Ambulatory Visit: Payer: Self-pay

## 2019-09-07 ENCOUNTER — Ambulatory Visit (INDEPENDENT_AMBULATORY_CARE_PROVIDER_SITE_OTHER): Payer: 59 | Admitting: Psychiatry

## 2019-09-07 DIAGNOSIS — F411 Generalized anxiety disorder: Secondary | ICD-10-CM

## 2019-09-07 NOTE — Progress Notes (Signed)
      [facility] Counselor/Therapist Progress Note  Patient ID: Helen Greene, MRN: [id number],    Date: 09/07/2019  Time Spent: 60 minutes  5:00pm to 6:00apm  Treatment Type: Individual Therapy  Reported Symptoms:  Anxiety, depression  Mental Status Exam:  Appearance:   Casual     Behavior:  Appropriate, Sharing and Motivated  Motor:  Normal  Speech/Language:   Normal Rate  Affect:  anxious, depressed  Mood:  anxious and depressed  Thought process:  normal  Thought content:    WNL  Sensory/Perceptual disturbances:    WNL  Orientation:  oriented to person, place, time/date, situation, day of week, month of year and year  Attention:  Good  Concentration:  Good  Memory:  WNL  Fund of knowledge:   Good  Insight:    Good  Judgment:   Good  Impulse Control:  Good   Risk Assessment: Danger to Self:  No Self-injurious Behavior: No Danger to Others: No Duty to Warn:no Physical Aggression / Violence:No  Access to Firearms a concern: No  Gang Involvement:No   Subjective:  Patient reports today that she feels she has "managed things some better".  Got stimulus check that helped her catch up on her bills.  Reporting anxiety and depression, with anxiety being some worse with work situations.   Interventions: Cognitive Behavioral Therapy and Ego-Supportive  Diagnosis:   ICD-10-CM   1. Generalized anxiety disorder  F41.1      Plan: Patient not signing tx plan on computer screen due to COVID.  Treatment Goals:Goals remain on tx plan as patient works on strategies  to meet her goals. Progress will be documented each session in "Progress" section on plan.   Long term goal: Develop the ability to recognize, accept, and cope with feelings ofanxiety anddepression.   Short term goal: Identify and replaceanxious anddepressive thinking that leads toanxious ordepressive feelings and actions.Replace with more realistic and empowering  thoughts.  Strategy: Reinforce patient's positive, reality based cognitive messages that enhance self-confidence and increase adaptive behaviors/action.  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  Has her 49th birthday coming up later this year and is starting to get anxious about it.  She vented about this initially as she arrived for appt. Listened and then encouraged her to remain more in the present and not jump too far ahead.  Also stressed in trying to get finances under control and try to buy her first house and move out of apartment. Exciting for her but "also scary and uncertain.". Self-talk is improving and so is her self-respect/self-care in not letting people take advantage of her.  Works more today on re-enforcing some of her most recent gains by identifying the cognitive messages (per Strategy above) that support improved self-confidence, self-care, and self-respect as this leads to increased adaptive behaviors/actions. Patient is to continue working on this between sessions and will pick up on this next session.  Goal review and progress/efforts noted with patient.  Next appt within 2-3 weeks.   Mathis Fare, LCSW

## 2019-09-21 ENCOUNTER — Ambulatory Visit (INDEPENDENT_AMBULATORY_CARE_PROVIDER_SITE_OTHER): Payer: 59 | Admitting: Psychiatry

## 2019-09-21 ENCOUNTER — Other Ambulatory Visit: Payer: Self-pay

## 2019-09-21 DIAGNOSIS — F411 Generalized anxiety disorder: Secondary | ICD-10-CM | POA: Diagnosis not present

## 2019-09-21 NOTE — Progress Notes (Signed)
      [facility] Counselor/Therapist Progress Note  Patient ID: Helen Greene, MRN: [id number],    Date: 09/21/2019  Time Spent:  60 minutes   5:00pm to 6:00pm  Treatment Type: Individual Therapy  Reported Symptoms: anxiety, some depression  Mental Status Exam:  Appearance:   Casual     Behavior:  Appropriate and Sharing  Motor:  Normal  Speech/Language:   Normal Rate  Affect:  anxious, some depression  Mood:  anxious and some depression  Thought process:  goal directed  Thought content:    WNL  Sensory/Perceptual disturbances:    WNL  Orientation:  oriented to person, place, time/date, situation, day of week, month of year and year  Attention:  Good  Concentration:  Good/Fair  Memory:  WNL  Fund of knowledge:   Good  Insight:    Good  Judgment:   Good  Impulse Control:  Good   Risk Assessment: Danger to Self:  No Self-injurious Behavior: No Danger to Others: No Duty to Warn:no Physical Aggression / Violence:No  Access to Firearms a concern: No  Gang Involvement:No   Subjective:  Patient in today with some anxiety and some depression, "feeling blah" in some ways personally due to some feelings of aloneness. Social distancing has been difficult for her.  Interventions: Cognitive Behavioral Therapy and Ego-Supportive  Diagnosis:   ICD-10-CM   1. Generalized anxiety disorder  F41.1      Plan: Patient not signing tx plan on computer screen due to COVID.  Treatment Goals:Goals remain on tx plan as patient works on strategies  to meet her goals. Progress will be documented each session in "Progress" section on plan.   Long term goal: Develop the ability to recognize, accept, and cope with feelings ofanxiety anddepression.   Short term goal: Identify and replaceanxious anddepressive thinking that leads toanxious ordepressive feelings and actions.Replace with more realistic and empowering thoughts.  Strategy: Reinforce patient's positive,  reality based cognitive messages that enhance self-confidence and increase adaptive behaviors/action.  Progress: Patient in today "feeling blah", anxious, and some depression.  Reports feeling more alone recently and hasn't really been comfortable around other people due to Covid.  Did have good family lunch together on Easter Sunday. Is looking forward to [place] trip in May to visit her brother and GF. Working today on more quickly identifying her anxious/depressive thoughts especially when alone, interrupting them, and replacing with more positive, reality-based, and empowering thought patterns that do not support anxiety nor depression. Self-talk had improved some but then became worse again when faced with more alone time.  She is planning to make efforts to be around other people more, especially in areas/places where social distancing is practiced. Also to be more intentional in her contact with friends/family.  Per strategy above, encouraged her also in the use of more positive self-talk and beliefs about herself that will help her self-confidence and increase adaptive behaviors on her part. Goal review and progress/challenges/efforts noted with patient.   Next appt within 2 weeks.     Mathis Fare, LCSW

## 2019-09-27 ENCOUNTER — Ambulatory Visit (INDEPENDENT_AMBULATORY_CARE_PROVIDER_SITE_OTHER): Payer: Managed Care, Other (non HMO) | Admitting: Family Medicine

## 2019-09-27 ENCOUNTER — Encounter: Payer: Self-pay | Admitting: Family Medicine

## 2019-09-27 ENCOUNTER — Other Ambulatory Visit: Payer: Self-pay

## 2019-09-27 VITALS — BP 122/84 | HR 91 | Temp 98.2°F | Ht 59.0 in | Wt 211.4 lb

## 2019-09-27 DIAGNOSIS — I10 Essential (primary) hypertension: Secondary | ICD-10-CM | POA: Diagnosis not present

## 2019-09-27 DIAGNOSIS — Z760 Encounter for issue of repeat prescription: Secondary | ICD-10-CM

## 2019-09-27 DIAGNOSIS — R5383 Other fatigue: Secondary | ICD-10-CM

## 2019-09-27 DIAGNOSIS — F32A Depression, unspecified: Secondary | ICD-10-CM

## 2019-09-27 DIAGNOSIS — F411 Generalized anxiety disorder: Secondary | ICD-10-CM | POA: Diagnosis not present

## 2019-09-27 DIAGNOSIS — F331 Major depressive disorder, recurrent, moderate: Secondary | ICD-10-CM | POA: Diagnosis not present

## 2019-09-27 DIAGNOSIS — F329 Major depressive disorder, single episode, unspecified: Secondary | ICD-10-CM

## 2019-09-27 MED ORDER — TELMISARTAN-HCTZ 40-12.5 MG PO TABS: 1.0000 | ORAL_TABLET | Freq: Every day | ORAL | 1 refills | Status: DC

## 2019-09-27 MED ORDER — OMEPRAZOLE 20 MG PO CPDR: 20.0000 mg | DELAYED_RELEASE_CAPSULE | Freq: Every day | ORAL | 3 refills | Status: DC

## 2019-09-27 NOTE — Progress Notes (Signed)
Established Patient Office Visit  Subjective:  Patient ID: Helen Greene, female    DOB: [birth year]  Age: 49 y.o. MRN: [id number]  CC:  Chief Complaint  Patient presents with  . Hypertension  . Hyperlipidemia    HPI Helen Greene presents for   Hypertension: Patient here for follow-up of elevated blood pressure. She is not exercising and is adherent to low salt diet.  Blood pressure is well controlled at home. Cardiac symptoms none. Patient denies chest pressure/discomfort, dyspnea, fatigue, irregular heart beat and near-syncope.  Cardiovascular risk factors: dyslipidemia, hypertension and obesity (BMI >= 30 kg/m2). Use of agents associated with hypertension: none and is taking aripiprazole for her moods.. History of target organ damage: none. BP Readings from Last 3 Encounters:  09/27/19 122/84  04/12/19 (!) 143/94  03/29/19 135/87     Chronic Fatigue and Depression  She reports that she is feeling depressed and just sleeps all the time because there is nothing to do and she is working from home.  She is also eating more and regained weight. She states that she was down to 195 before the pandemic. She reports that she has not had the vaccine for covid.  Wt Readings from Last 3 Encounters:  09/27/19 211 lb 6.4 oz (95.9 kg)  04/12/19 207 lb (93.9 kg)  03/29/19 208 lb 3.2 oz (94.4 kg)    No flowsheet data found.    Depression screen [facility] 2/9 09/27/2019 04/12/2019 03/29/2019 03/29/2019 10/08/2018  Decreased Interest 0 0 0 0 0  Down, Depressed, Hopeless 0 0 0 0 0  PHQ - 2 Score 0 0 0 0 0  Altered sleeping - - - - -  Tired, decreased energy - - - - -  Change in appetite - - - - -  Feeling bad or failure about yourself  - - - - -  Trouble concentrating - - - - -  Moving slowly or fidgety/restless - - - - -  Suicidal thoughts - - - - -  PHQ-9 Score - - - - -  Difficult doing work/chores - - - - -  Some recent data might be hidden     Past Medical History:  Diagnosis Date   . Allergy   . Anxiety   . Depression   . Elevated white blood cell count   . Elevated white blood cell count 09/25/2011  . GERD (gastroesophageal reflux disease)   . Headache    Migraine  . Hypertension   . Kidney stone   . Neuromuscular disorder ([place])    carpel tunnel disease  . Pneumonia 08/15/2017  . Restless leg syndrome   . Shortness of breath dyspnea    with exertion  . Sleep apnea    does not have CPAP machine, mostly affects back sleeping was encouraged to sleep on her sides  . Tonsillar calculus     Past Surgical History:  Procedure Laterality Date  . COLONOSCOPY    . DILATION AND CURETTAGE OF UTERUS  1993  . ECTOPIC PREGNANCY SURGERY    . ESSURE TUBAL LIGATION    . LAPAROSCOPIC CHOLECYSTECTOMY W/ CHOLANGIOGRAPHY  02/25/2001   Dr Hulen Skains  . LITHOTRIPSY  12/22/2014  . ROBOTIC ASSISTED TOTAL HYSTERECTOMY WITH SALPINGECTOMY Left 09/14/2015   Procedure: ROBOTIC ASSISTED TOTAL HYSTERECTOMY WITH LEFT SALPINGECTOMY;  Surgeon: Aloha Gell, MD;  Location: [facility];  Service: Gynecology;  Laterality: Left;  . TONSILLECTOMY N/A 08/11/2017   Procedure: TONSILLECTOMY;  Surgeon: Izora Gala, MD;  Location: [facility];  Service: ENT;  Laterality: N/A;  . UPPER GI ENDOSCOPY    . WISDOM TOOTH EXTRACTION      Family History  Problem Relation Age of Onset  . Hypertension Mother   . Heart failure Mother   . Cancer Mother   . Heart disease Mother   . Heart disease Brother   . Hypertension Brother   . Cancer Father        Leukemia  . Hypertension Father   . Cancer Sister   . Breast cancer Sister   . Diabetes Other   . Colon cancer Neg Hx   . Esophageal cancer Neg Hx   . Rectal cancer Neg Hx   . Stomach cancer Neg Hx     Social History   Socioeconomic History  . Marital status: Divorced    Spouse name: Not on file  . Number of children: Not on file  . Years of education: Not on file  . Highest education level: Not on file  Occupational History  . Not  on file  Tobacco Use  . Smoking status: Never Smoker  . Smokeless tobacco: Never Used  Substance and Sexual Activity  . Alcohol use: Yes    Alcohol/week: 1.0 standard drinks    Types: 1 Glasses of wine per week    Comment: occas  . Drug use: No  . Sexual activity: Not Currently    Partners: Male    Comment: 1ST intercourse- 18, partners-  2   Other Topics Concern  . Not on file  Social History Narrative  . Not on file   Social Determinants of Health   Financial Resource Strain:   . Difficulty of Paying Living Expenses:   Food Insecurity:   . Worried About Charity fundraiser in the Last Year:   . Arboriculturist in the Last Year:   Transportation Needs:   . Film/video editor (Medical):   Marland Kitchen Lack of Transportation (Non-Medical):   Physical Activity:   . Days of Exercise per Week:   . Minutes of Exercise per Session:   Stress:   . Feeling of Stress :   Social Connections:   . Frequency of Communication with Friends and Family:   . Frequency of Social Gatherings with Friends and Family:   . Attends Religious Services:   . Active Member of Clubs or Organizations:   . Attends Archivist Meetings:   Marland Kitchen Marital Status:   Intimate Partner Violence:   . Fear of Current or Ex-Partner:   . Emotionally Abused:   Marland Kitchen Physically Abused:   . Sexually Abused:     Outpatient Medications Prior to Visit  Medication Sig Dispense Refill  . ARIPiprazole (ABILIFY) 2 MG tablet Take 1 tablet (2 mg total) by mouth every morning. 90 tablet 1  . buPROPion (WELLBUTRIN XL) 300 MG 24 hr tablet Take 1 tablet (300 mg total) by mouth every morning. 90 tablet 1  . escitalopram (LEXAPRO) 20 MG tablet Take 1 tablet (20 mg total) by mouth daily. 90 tablet 1  . fluticasone (FLONASE) 50 MCG/ACT nasal spray Place 2 sprays into both nostrils daily. 16 g 5  . LORazepam (ATIVAN) 0.5 MG tablet Take 1 tablet (0.5 mg total) by mouth every 8 (eight) hours as needed for anxiety. 90 tablet 1  .  pramipexole (MIRAPEX) 0.25 MG tablet Take 1 tablet (0.25 mg total) by mouth at bedtime. 90 tablet 1  . VITAMIN D PO Take by mouth.    Marland Kitchen  omeprazole (PRILOSEC) 20 MG capsule Take 1 capsule (20 mg total) by mouth daily. 90 capsule 3  . telmisartan-hydrochlorothiazide (MICARDIS HCT) 40-12.5 MG tablet Take 1 tablet by mouth daily. 90 tablet 1  . Ferrous Sulfate (IRON PO) Take by mouth.    . mirabegron ER (MYRBETRIQ) 50 MG TB24 tablet Take 50 mg by mouth daily.    . ondansetron (ZOFRAN) 4 MG tablet Take 1 tablet (4 mg total) by mouth every 8 (eight) hours as needed for nausea or vomiting. (Patient not taking: Reported on 07/08/2019) 20 tablet 0  . valACYclovir (VALTREX) 1000 MG tablet TAKE 2 TABLETS BY MOUTH EVERY 12 HOURS AS NEEDED FOR ONE DAY WITH OUTBREAK (Patient not taking: Reported on 09/27/2019) 20 tablet 0  . azelastine (ASTELIN) 0.1 % nasal spray Place 2 sprays into both nostrils 3 (three) times daily. (Patient not taking: Reported on 07/08/2019) 30 mL 5   No facility-administered medications prior to visit.    Allergies  Allergen Reactions  . Bee Venom Hives and Swelling  . Enablex [Darifenacin Hydrobromide] Other (See Comments)    Phlebitis.  . Erythromycin Hives  . Macrobid [Nitrofurantoin Monohyd Macro] Nausea And Vomiting  . Metronidazole     GI upset with tablet form  . Nitrofuran Derivatives Nausea And Vomiting  . Risperidone And Related Swelling    ROS Review of Systems Review of Systems  Constitutional: Negative for activity change, appetite change, chills and fever.  HENT: Negative for congestion, nosebleeds, trouble swallowing and voice change.   Respiratory: Negative for cough, shortness of breath and wheezing.   Gastrointestinal: Negative for diarrhea, nausea and vomiting.  Genitourinary: Negative for difficulty urinating, dysuria, flank pain and hematuria.  Musculoskeletal: Negative for back pain, joint swelling and neck pain.  Neurological: Negative for dizziness,  speech difficulty, light-headedness and numbness.  See HPI. All other review of systems negative.     Objective:    Physical Exam  BP 122/84   Pulse 91   Temp 98.2 F (36.8 C) (Temporal)   Ht '4\' 11"'$  (1.499 m)   Wt 211 lb 6.4 oz (95.9 kg)   LMP 09/07/2015 (Exact Date)   SpO2 93%   BMI 42.70 kg/m  Wt Readings from Last 3 Encounters:  09/27/19 211 lb 6.4 oz (95.9 kg)  04/12/19 207 lb (93.9 kg)  03/29/19 208 lb 3.2 oz (94.4 kg)   Physical Exam  Constitutional: Oriented to person, place, and time. Appears well-developed and well-nourished.  HENT:  Head: Normocephalic and atraumatic.  Eyes: Conjunctivae and EOM are normal.  Cardiovascular: Normal rate, regular rhythm, normal heart sounds and intact distal pulses.  No murmur heard. Pulmonary/Chest: Effort normal and breath sounds normal. No stridor. No respiratory distress. Has no wheezes.  Neurological: Is alert and oriented to person, place, and time.  Skin: Skin is warm. Capillary refill takes less than 2 seconds.  Psychiatric: Has a normal mood and affect. Behavior is normal. Judgment and thought content normal.    There are no preventive care reminders to display for this patient.  There are no preventive care reminders to display for this patient.  Lab Results  Component Value Date   TSH 2.540 02/13/2015   Lab Results  Component Value Date   WBC 14.3 (H) 11/12/2017   HGB 13.2 11/12/2017   HCT 40.2 11/12/2017   MCV 89.9 11/12/2017   PLT 413 (H) 11/12/2017   Lab Results  Component Value Date   NA 141 03/29/2019   K 4.3 03/29/2019   CO2  21 03/29/2019   GLUCOSE 92 03/29/2019   BUN 19 03/29/2019   CREATININE 0.96 03/29/2019   BILITOT 0.7 08/16/2017   ALKPHOS 163 (H) 08/16/2017   AST 29 08/16/2017   ALT 62 (H) 08/16/2017   PROT 5.8 (L) 08/16/2017   ALBUMIN 2.1 (L) 08/16/2017   CALCIUM 9.7 03/29/2019   ANIONGAP 8 11/12/2017   Lab Results  Component Value Date   CHOL 265 (H) 06/28/2016   Lab Results   Component Value Date   HDL 46 06/28/2016   Lab Results  Component Value Date   LDLCALC 160 (H) 06/28/2016   Lab Results  Component Value Date   TRIG 297 (H) 06/28/2016   Lab Results  Component Value Date   CHOLHDL 5.8 (H) 06/28/2016   Lab Results  Component Value Date   HGBA1C 5.4 06/28/2016      Assessment & Plan:   Problem List Items Addressed This Visit      Cardiovascular and Mediastinum   Essential hypertension  - Patient's blood pressure is at goal of 139/89 or less. Condition is stable. Continue current medications and treatment plan. I recommend that you exercise for 30-45 minutes 5 days a week. I also recommend a balanced diet with fruits and vegetables every day, lean meats, and little fried foods. The DASH diet (you can find this online) is a good example of this.    Relevant Medications   telmisartan-hydrochlorothiazide (MICARDIS HCT) 40-12.5 MG tablet   Other Relevant Orders   CMP14+EGFR   Lipid panel     Other   Major depressive disorder, recurrent episode, moderate (HCC)  -  Continue with Behavioral Health with    Relevant Orders   VITAMIN D 25 Hydroxy (Vit-D Deficiency, Fractures)    Other Visit Diagnoses    Fatigue due to depression    -  Primary Advised pt to continue    Relevant Orders   TSH   VITAMIN D 25 Hydroxy (Vit-D Deficiency, Fractures)   Generalized anxiety disorder    - discussed getting out safely during covid   Encounter for medication refill       Relevant Medications   omeprazole (PRILOSEC) 20 MG capsule   telmisartan-hydrochlorothiazide (MICARDIS HCT) 40-12.5 MG tablet      Meds ordered this encounter  Medications  . omeprazole (PRILOSEC) 20 MG capsule    Sig: Take 1 capsule (20 mg total) by mouth daily.    Dispense:  90 capsule    Refill:  3  . telmisartan-hydrochlorothiazide (MICARDIS HCT) 40-12.5 MG tablet    Sig: Take 1 tablet by mouth daily.    Dispense:  90 tablet    Refill:  1    Follow-up: No follow-ups on  file.   A total of 25 minutes were spent face-to-face with the patient during this encounter and over half of that time was spent on counseling and coordination of care.  Forrest Moron, MD

## 2019-09-27 NOTE — Patient Instructions (Addendum)
COVID-19 Vaccine Information can be found at: [email or web address] For questions related to vaccine distribution or appointments, please email [email or web address] or call [phone].     If you have lab work done today you will be contacted with your lab results within the next 2 weeks.  If you have not heard from [place] then please contact us. The fastest way to get your results is to register for My Chart.   IF you received an x-ray today, you will receive an invoice from [facility]. Please contact [facility] at [phone] with questions or concerns regarding your invoice.   IF you received labwork today, you will receive an invoice from [facility]. Please contact [facility] at [phone] with questions or concerns regarding your invoice.   Our billing staff will not be able to assist you with questions regarding bills from these companies.  You will be contacted with the lab results as soon as they are available. The fastest way to get your results is to activate your My Chart account. Instructions are located on the last page of this paperwork. If you have not heard from [place] regarding the results in 2 weeks, please contact this office.      Living With Depression Everyone experiences occasional disappointment, sadness, and loss in their lives. When you are feeling down, blue, or sad for at least 2 weeks in a row, it may mean that you have depression. Depression can affect your thoughts and feelings, relationships, daily activities, and physical health. It is caused by changes in the way your brain functions. If you receive a diagnosis of depression, your health care provider will tell you which type of depression you have and what treatment options are available to you. If you are living with depression, there are ways to help you recover from it and also ways to prevent it from coming back. How to cope with  lifestyle changes Coping with stress     Stress is your body's reaction to life changes and events, both good and bad. Stressful situations may include:  Getting married.  The death of a spouse.  Losing a job.  Retiring.  Having a baby. Stress can last just a few hours or it can be ongoing. Stress can play a major role in depression, so it is important to learn both how to cope with stress and how to think about it differently. Talk with your health care provider or a counselor if you would like to learn more about stress reduction. He or she may suggest some stress reduction techniques, such as:  Music therapy. This can include creating music or listening to music. Choose music that you enjoy and that inspires you.  Mindfulness-based meditation. This kind of meditation can be done while sitting or walking. It involves being aware of your normal breaths, rather than trying to control your breathing.  Centering prayer. This is a kind of meditation that involves focusing on a spiritual word or phrase. Choose a word, phrase, or sacred image that is meaningful to you and that brings you peace.  Deep breathing. To do this, expand your stomach and inhale slowly through your nose. Hold your breath for 3-5 seconds, then exhale slowly, allowing your stomach muscles to relax.  Muscle relaxation. This involves intentionally tensing muscles then relaxing them. Choose a stress reduction technique that fits your lifestyle and personality. Stress reduction techniques take time and practice to develop. Set aside 5-15 minutes a day to do them. Therapists can offer training in these techniques.  The training may be covered by some insurance plans. Other things you can do to manage stress include:  Keeping a stress diary. This can help you learn what triggers your stress and ways to control your response.  Understanding what your limits are and saying no to requests or events that lead to a schedule that  is too full.  Thinking about how you respond to certain situations. You may not be able to control everything, but you can control how you react.  Adding humor to your life by watching funny films or TV shows.  Making time for activities that help you relax and not feeling guilty about spending your time this way.  Medicines Your health care provider may suggest certain medicines if he or she feels that they will help improve your condition. Avoid using alcohol and other substances that may prevent your medicines from working properly (may interact). It is also important to:  Talk with your pharmacist or health care provider about all the medicines that you take, their possible side effects, and what medicines are safe to take together.  Make it your goal to take part in all treatment decisions (shared decision-making). This includes giving input on the side effects of medicines. It is best if shared decision-making with your health care provider is part of your total treatment plan. If your health care provider prescribes a medicine, you may not notice the full benefits of it for 4-8 weeks. Most people who are treated for depression need to be on medicine for at least 6-12 months after they feel better. If you are taking medicines as part of your treatment, do not stop taking medicines without first talking to your health care provider. You may need to have the medicine slowly decreased (tapered) over time to decrease the risk of harmful side effects. Relationships Your health care provider may suggest family therapy along with individual therapy and drug therapy. While there may not be family problems that are causing you to feel depressed, it is still important to make sure your family learns as much as they can about your mental health. Having your family's support can help make your treatment successful. How to recognize changes in your condition Everyone has a different response to treatment  for depression. Recovery from major depression happens when you have not had signs of major depression for two months. This may mean that you will start to:  Have more interest in doing activities.  Feel less hopeless than you did 2 months ago.  Have more energy.  Overeat less often, or have better or improving appetite.  Have better concentration. Your health care provider will work with you to decide the next steps in your recovery. It is also important to recognize when your condition is getting worse. Watch for these signs:  Having fatigue or low energy.  Eating too much or too little.  Sleeping too much or too little.  Feeling restless, agitated, or hopeless.  Having trouble concentrating or making decisions.  Having unexplained physical complaints.  Feeling irritable, angry, or aggressive. Get help as soon as you or your family members notice these symptoms coming back. How to get support and help from others How to talk with friends and family members about your condition  Talking to friends and family members about your condition can provide you with one way to get support and guidance. Reach out to trusted friends or family members, explain your symptoms to them, and let them know that you  are working with a health care provider to treat your depression. Financial resources Not all insurance plans cover mental health care, so it is important to check with your insurance carrier. If paying for co-pays or counseling services is a problem, search for a local or county mental health care center. They may be able to offer public mental health care services at low or no cost when you are not able to see a private health care provider. If you are taking medicine for depression, you may be able to get the generic form, which may be less expensive. Some makers of prescription medicines also offer help to patients who cannot afford the medicines they need. Follow these instructions  at home:   Get the right amount and quality of sleep.  Cut down on using caffeine, tobacco, alcohol, and other potentially harmful substances.  Try to exercise, such as walking or lifting small weights.  Take over-the-counter and prescription medicines only as told by your health care provider.  Eat a healthy diet that includes plenty of vegetables, fruits, whole grains, low-fat dairy products, and lean protein. Do not eat a lot of foods that are high in solid fats, added sugars, or salt.  Keep all follow-up visits as told by your health care provider. This is important. Contact a health care provider if:  You stop taking your antidepressant medicines, and you have any of these symptoms: ? Nausea. ? Headache. ? Feeling lightheaded. ? Chills and body aches. ? Not being able to sleep (insomnia).  You or your friends and family think your depression is getting worse. Get help right away if:  You have thoughts of hurting yourself or others. If you ever feel like you may hurt yourself or others, or have thoughts about taking your own life, get help right away. You can go to your nearest emergency department or call:  Your local emergency services (911 in the [place]  A suicide crisis helpline, such as the National Suicide Prevention Lifeline at 563-888-4125. This is open 24-hours a day. Summary  If you are living with depression, there are ways to help you recover from it and also ways to prevent it from coming back.  Work with your health care team to create a management plan that includes counseling, stress management techniques, and healthy lifestyle habits. This information is not intended to replace advice given to you by your health care provider. Make sure you discuss any questions you have with your health care provider. Document Revised: 09/25/2018 Document Reviewed: 05/06/2016 Elsevier Patient Education  2020 [employer].

## 2019-09-28 ENCOUNTER — Encounter: Payer: Self-pay | Admitting: Family Medicine

## 2019-09-28 ENCOUNTER — Other Ambulatory Visit: Payer: Self-pay | Admitting: Family Medicine

## 2019-09-28 LAB — CMP14+EGFR
ALT: 16 IU/L (ref 0–32)
AST: 21 IU/L (ref 0–40)
Albumin/Globulin Ratio: 1.7 (ref 1.2–2.2)
Albumin: 4.3 g/dL (ref 3.8–4.8)
Alkaline Phosphatase: 121 IU/L — ABNORMAL HIGH (ref 39–117)
BUN/Creatinine Ratio: 18 (ref 9–23)
BUN: 17 mg/dL (ref 6–24)
Bilirubin Total: 0.4 mg/dL (ref 0.0–1.2)
CO2: 22 mmol/L (ref 20–29)
Calcium: 9.7 mg/dL (ref 8.7–10.2)
Chloride: 98 mmol/L (ref 96–106)
Creatinine, Ser: 0.92 mg/dL (ref 0.57–1.00)
GFR calc Af Amer: 85 mL/min/{1.73_m2} (ref >59)
GFR calc non Af Amer: 73 mL/min/{1.73_m2} (ref >59)
Globulin, Total: 2.6 g/dL (ref 1.5–4.5)
Glucose: 94 mg/dL (ref 65–99)
Potassium: 3.7 mmol/L (ref 3.5–5.2)
Sodium: 140 mmol/L (ref 134–144)
Total Protein: 6.9 g/dL (ref 6.0–8.5)

## 2019-09-28 LAB — LIPID PANEL
Chol/HDL Ratio: 5 ratio — ABNORMAL HIGH (ref 0.0–4.4)
Cholesterol, Total: 230 mg/dL — ABNORMAL HIGH (ref 100–199)
HDL: 46 mg/dL (ref >39)
LDL Chol Calc (NIH): 122 mg/dL — ABNORMAL HIGH (ref 0–99)
Triglycerides: 353 mg/dL — ABNORMAL HIGH (ref 0–149)
VLDL Cholesterol Cal: 62 mg/dL — ABNORMAL HIGH (ref 5–40)

## 2019-09-28 LAB — TSH: TSH: 3.27 u[IU]/mL (ref 0.450–4.500)

## 2019-09-28 LAB — VITAMIN D 25 HYDROXY (VIT D DEFICIENCY, FRACTURES): Vit D, 25-Hydroxy: 19.4 ng/mL — ABNORMAL LOW (ref 30.0–100.0)

## 2019-09-28 MED ORDER — ATORVASTATIN CALCIUM 10 MG PO TABS: 10.0000 mg | ORAL_TABLET | ORAL | 3 refills | Status: DC

## 2019-10-05 ENCOUNTER — Ambulatory Visit (INDEPENDENT_AMBULATORY_CARE_PROVIDER_SITE_OTHER): Payer: 59 | Admitting: Psychiatry

## 2019-10-05 ENCOUNTER — Other Ambulatory Visit: Payer: Self-pay

## 2019-10-05 DIAGNOSIS — F411 Generalized anxiety disorder: Secondary | ICD-10-CM

## 2019-10-05 NOTE — Progress Notes (Signed)
      [facility] Counselor/Therapist Progress Note  Patient ID: Helen Greene, MRN: [id number],    Date: 10/05/2019  Time Spent: 60 minutes  5:00pm to 6:00pm  Treatment Type: Individual Therapy  Reported Symptoms:  Anxiety, depression, "lately have had the blahs" but am better past couple days Mental Status Exam:  Appearance:   Neat     Behavior:  Appropriate, Sharing and Motivated  Motor:  Normal  Speech/Language:   Normal Rate  Affect:  anxious, depressed  Mood:  normal  Thought process:  normal  Thought content:    WNL  Sensory/Perceptual disturbances:    WNL  Orientation:  oriented to person, place, time/date, situation, day of week, month of year and year  Attention:  Good  Concentration:  Good  Memory:  ocassional  forgetfulness, worse under stress  Fund of knowledge:   Good  Insight:    Good/Fair  Judgment:   Good  Impulse Control:  Good   Risk Assessment: Danger to Self:  No Self-injurious Behavior: No Danger to Others: No Duty to Warn:no Physical Aggression / Violence:No  Access to Firearms a concern: No  Gang Involvement:No   Subjective: Patient in today with anxiety, "the blahs", but felt better past couple days.  Felt lonely "even though I have friends.  Interventions: Cognitive Behavioral Therapy and Ego-Supportive  Diagnosis:   ICD-10-CM   1. Generalized anxiety disorder  F41.1      Plan: Patient not signing tx plan on computer screen due to COVID.  Treatment Goals:Goals remain on tx plan as patient works on strategies  to meet her goals. Progress will be documented each session in "Progress" section on plan.   Long term goal: Develop the ability to recognize, accept, and cope with feelings ofanxiety anddepression.   Short term goal: Identify and replaceanxious anddepressive thinking that leads toanxious ordepressive feelings and actions.Replace with more realistic and empowering thoughts.  Strategy: Reinforce patient's  positive, reality based cognitive messages that enhance self-confidence and increase adaptive behaviors/action.  Progress: Patient today in with anxiety, depression, "the blahs" more recently until the past couple days.  Feeling lonely at times "but I have friends".  Realizing that the Covid social distancing has affected her as well as working from home she is more isolated. Wants to get out more.  Noticed this weekend she had a better day when she went out with her family to the zoo. Realized how much she has been cooped up. States that lab work at her doctor's office showed that she is deficient in Vitamin D so was started on 5000IU Vitamin D and Lipitor for cholesterol that was elevated. Reviewed homework re: anxious/depressive thoughts and patient states she did find it hard on her own to quickly identify those thoughts, "but did identify a few but found it hard to replace them with more positive, realistic thoughts".  Worked on some today in session and she had some success and agreed to keep on working on this outside of therapy office.  To also be very intentional in staying in contact with family/friends. To use more positive self-talk and positive  beliefs about her self that can help her self-confidence and help with he efforts to increase her adaptive behaviors per goal plan above.  Goal review and progress noted with patient.  Next appt within 2-3 weeks.   Mathis Fare, LCSW

## 2019-10-19 ENCOUNTER — Other Ambulatory Visit: Payer: Self-pay

## 2019-10-19 ENCOUNTER — Ambulatory Visit (INDEPENDENT_AMBULATORY_CARE_PROVIDER_SITE_OTHER): Payer: 59 | Admitting: Psychiatry

## 2019-10-19 DIAGNOSIS — F411 Generalized anxiety disorder: Secondary | ICD-10-CM

## 2019-10-19 NOTE — Progress Notes (Signed)
      [facility] Counselor/Therapist Progress Note  Patient ID: Helen Greene, MRN: [id number],    Date: 10/19/2019  Time Spent: 60 minutes 5:00pm to 6:00pm  Treatment Type: Individual Therapy  Reported Symptoms: anxiety, depression, frustration  Mental Status Exam:  Appearance:   Neat     Behavior:  Appropriate, Sharing and Motivated  Motor:  Normal  Speech/Language:   Normal Rate  Affect:  anxiety, some depressed  Mood:  anxious and depressed  Thought process:  goal directed  Thought content:    WNL  Sensory/Perceptual disturbances:    WNL  Orientation:  oriented to person, place, time/date, situation, day of week, month of year and year  Attention:  Good  Concentration:  Good  Memory:  WNL  Fund of knowledge:   Good  Insight:    Good  Judgment:   Good and Fair  Impulse Control:  Good and Fair   Risk Assessment: Danger to Self:  No Self-injurious Behavior: No Danger to Others: No Duty to Warn:no Physical Aggression / Violence:No  Access to Firearms a concern: No  Gang Involvement:No   Subjective: Patient in today reporting anxiety, depression, and frustrations and states that her depression has decreased some.  Feeling better physically with daily Vit D 5000iu's per her physcian. More energy and more task completion at home.  Interventions: Cognitive Behavioral Therapy, Solution-Oriented/Positive Psychology and Ego-Supportive  Diagnosis:   ICD-10-CM   1. Generalized anxiety disorder  F41.1     Plan: Patient not signing tx plan on computer screen due to COVID.  Treatment Goals:Goals remain on tx plan as patient works on strategies  to meet her goals. Progress will be documented each session in "Progress" section on plan.   Long term goal: Develop the ability to recognize, accept, and cope with feelings ofanxiety anddepression.   Short term goal: Identify and replaceanxious anddepressive thinking that leads toanxious ordepressive feelings and  actions.Replace with more realistic and empowering thoughts.  Strategy: Reinforce patient's positive, reality based cognitive messages that enhance self-confidence and increase adaptive behaviors/action.  Progress: Patient in today reporting anxiety, frustrations, and depression (decreased some).  Signed up for "a self protection class."  Is frustrated with some financial stressors related to money she was expecting to put down on a small home. Doing better on her Vit D supplements (5000iu's). Not feeling as lonely, and friend is back in town and at her appt for now. States that she is "not having as many anxious/depressive thoughts since last appt."  Things at work are going fairly well. Has shown more progress this session and has done some good work in following up on her goal-directed behaviors especially in working on better managing anxious/depressive thoughts.  To focus more also on increasing her positive self-talk and beliefs about herself, per Strategy above, that is important in building her self-confidence and ultimately leading to more adaptive behaviors.  Goal review and progress noted with patient.  Next appt within 2-3 weeks.   Mathis Fare, LCSW

## 2019-11-04 ENCOUNTER — Ambulatory Visit (INDEPENDENT_AMBULATORY_CARE_PROVIDER_SITE_OTHER): Payer: 59 | Admitting: Psychiatry

## 2019-11-04 ENCOUNTER — Other Ambulatory Visit: Payer: Self-pay

## 2019-11-04 DIAGNOSIS — F411 Generalized anxiety disorder: Secondary | ICD-10-CM

## 2019-11-04 NOTE — Progress Notes (Signed)
      [facility] Counselor/Therapist Progress Note  Patient ID: Helen Greene, MRN: [id number],    Date: 11/04/2019  Time Spent: 60 minutes  4:00pm to 5:00pm  Treatment Type: Individual Therapy  Reported Symptoms: anxiety, depression (both improving)  Mental Status Exam:  Appearance:   Casual     Behavior:  Appropriate, Sharing and some more motivated  Motor:  Normal  Speech/Language:   Normal Rate  Affect:  anxious  Mood:  anxious  Thought process:  goal directed  Thought content:    WNL  Sensory/Perceptual disturbances:    WNL  Orientation:  oriented to person, place, time/date, situation, day of week, month of year and year  Attention:  Fair sometimes, Good at times  Concentration:  Good and Fair  Memory:  some forgetfulness but "not too bad"  Fund of knowledge:   Good  Insight:    Good and Fair  Judgment:   Good  Impulse Control:  Good   Risk Assessment: Danger to Self:  No Self-injurious Behavior: No Danger to Others: No Duty to Warn:no Physical Aggression / Violence:No  Access to Firearms a concern: No  Gang Involvement:No   Subjective: Patient today reports anxiety and depression and both have decreased. Still some anxiety, depression, and frustration with home situations and relationships that she is working on, particularly in boundary setting.  Interventions: Cognitive Behavioral Therapy and Solution-Oriented/Positive Psychology  Diagnosis:   ICD-10-CM   1. Generalized anxiety disorder  F41.1      Plan: Patient not signing tx plan on computer screen due to COVID.  Treatment Goals:Goals remain on tx plan as patient works on strategies  to meet her goals. Progress will be documented each session in "Progress" section on plan.   Long term goal: Develop the ability to recognize, accept, and cope with feelings ofanxiety anddepression.   Short term goal: Identify and replaceanxious anddepressive thinking that leads toanxious ordepressive  feelings and actions.Replace with more realistic and empowering thoughts.  Strategy: Reinforce patient's positive, reality based cognitive messages that enhance self-confidence and increase adaptive behaviors/action.  Progress: Patient in today reporting anxiety and depression, "but they are both improving." "I'm not as down as I was nor as anxious as I was in past." Still "have anxiety and depression at times but it is better." Setting good boundaries "is really hard for me because I want to keep people happy with me.  Don't like people being angry with me because it makes me sad. Processed this with patient.  Recognizes that she has been a "people pleaser" and she is starting to understand and see that more.  To work on this some between sessions, recognizing it and trying to limit her people-pleasing behaviors, while increasing her positive self-talk and beliefs about herself that help her in building more self-confidence.  Looking forward to her "self-protection class" soon.  Affect is more improved today.  Goal review and progress noted with patient.  Next appt within 3 weeks.   Mathis Fare, LCSW

## 2019-11-23 ENCOUNTER — Other Ambulatory Visit: Payer: Self-pay

## 2019-11-23 ENCOUNTER — Ambulatory Visit (INDEPENDENT_AMBULATORY_CARE_PROVIDER_SITE_OTHER): Payer: 59 | Admitting: Psychiatry

## 2019-11-23 DIAGNOSIS — F411 Generalized anxiety disorder: Secondary | ICD-10-CM | POA: Diagnosis not present

## 2019-11-23 NOTE — Progress Notes (Signed)
      [facility] Counselor/Therapist Progress Note  Patient ID: Helen Greene, MRN: [id number],    Date: 11/23/2019  Time Spent: 60 minutes   5:00pm to 6:00pm  Treatment Type: Individual Therapy  Reported Symptoms: anxiety, "but my depression is gone"  Mental Status Exam:  Appearance:   Casual     Behavior:  Appropriate and Sharing  Motor:  Normal  Speech/Language:   Normal Rate  Affect:  anxious  Mood:  anxious  Thought process:  goal directed  Thought content:    WNL  Sensory/Perceptual disturbances:    WNL  Orientation:  oriented to person, place, time/date, situation, day of week, month of year and year  Attention:  Good  Concentration:  Good  Memory:  some forgetfulness"but not a lot"  Fund of knowledge:   Good  Insight:    Good  Judgment:   Good  Impulse Control:  Good   Risk Assessment: Danger to Self:  No Self-injurious Behavior: No Danger to Others: No Duty to Warn:no Physical Aggression / Violence:No  Access to Firearms a concern: No  Gang Involvement:No   Subjective: Patient today reports anxiety continues but she is working harder with strategies to try and better manage it.   Interventions: Cognitive Behavioral Therapy, Solution-Oriented/Positive Psychology and Ego-Supportive  Diagnosis:   ICD-10-CM   1. Generalized anxiety disorder  F41.1      Plan: Patient not signing tx plan on computer screen due to COVID.  Treatment Goals:Goals remain on tx plan as patient works on strategies  to meet her goals. Progress will be documented each session in "Progress" section on plan.   Long term goal: Develop the ability to recognize, accept, and cope with feelings ofanxiety anddepression.   Short term goal: Identify and replaceanxious anddepressive thinking that leads toanxious ordepressive feelings and actions.Replace with more realistic and empowering thoughts.  Strategy: Reinforce patient's positive, reality based cognitive messages  that enhance self-confidence and increase adaptive behaviors/action.  Progress: Patient in today reporting anxiety as her primary symptom.  States she does not currently experience any depression.  Being more intentional in spending time with her family and that feels supportive to her. Having issues with a room-mate, and hard for patient to set appropriate boundaries due to her people-pleasing habits.  Following up from last appt and assignment given specifically addressing her people-pleasing habits and her self-esteem. Processed this with patient and he is doing less people-pleasing but does admit "tt is not very easy for me." She reports working more on her positive self-talk which has increased some "but need to focus on it more in order to build confidence" as we discussed in prior sessions. Patient is motivated to continue her work on goals and is definitely progressing.     Goal review and progress noted with patient.  Next appt within 3 weeks.   Mathis Fare, LCSW

## 2019-12-09 ENCOUNTER — Other Ambulatory Visit: Payer: Self-pay | Admitting: Physician Assistant

## 2019-12-14 ENCOUNTER — Other Ambulatory Visit: Payer: Self-pay

## 2019-12-14 ENCOUNTER — Ambulatory Visit (INDEPENDENT_AMBULATORY_CARE_PROVIDER_SITE_OTHER): Payer: 59 | Admitting: Psychiatry

## 2019-12-14 DIAGNOSIS — F411 Generalized anxiety disorder: Secondary | ICD-10-CM | POA: Diagnosis not present

## 2019-12-14 NOTE — Progress Notes (Signed)
      [facility] Counselor/Therapist Progress Note  Patient ID: Helen Greene, MRN: [id number],    Date: 12/14/2019  Time Spent: 60 minutes  5:00pm to 6:00pm  Treatment Type: Individual Therapy  Reported Symptoms: anxiety, self-doubt and lower self-esteem  Mental Status Exam:  Appearance:   Casual     Behavior:  Appropriate, Sharing and Motivated  Motor:  Normal  Speech/Language:   Normal Rate  Affect:  anxious  Mood:  anxious  Thought process:  normal  Thought content:    WNL  Sensory/Perceptual disturbances:    WNL  Orientation:  oriented to person, place, time/date, situation, day of week, month of year and year  Attention:  Good  Concentration:  Good and Fair  Memory:  WNL  Fund of knowledge:   Good  Insight:    Good and Fair  Judgment:   Good  Impulse Control:  Good   Risk Assessment: Danger to Self:  No Self-injurious Behavior: No Danger to Others: No Duty to Warn:no Physical Aggression / Violence:No  Access to Firearms a concern: No  Gang Involvement:No   Subjective: Patient today reporting anxiety.  Depression "still gone and not a problem."  Lower self-esteem and self-doubt.  Interventions: Cognitive Behavioral Therapy and Solution-Oriented/Positive Psychology  Diagnosis:   ICD-10-CM   1. Generalized anxiety disorder  F41.1     Plan: Patient not signing tx plan on computer screen due to COVID.  Treatment Goals:Goals remain on tx plan as patient works on strategies  to meet her goals. Progress will be documented each session in "Progress" section on plan.   Long term goal: Develop the ability to recognize, accept, and cope with feelings ofanxiety anddepression.   Short term goal: Identify and replaceanxious anddepressive thinking that leads toanxious ordepressive feelings and actions.Replace with more realistic and empowering thoughts.  Strategy: Reinforce patient's positive, reality based cognitive messages that enhance  self-confidence and increase adaptive behaviors/action.  Progress: Patient in today experiencing anxiety, low self-esteem, and self-doubt. Does engage in frequent negative self-talk which she has agreed to work on.  Discusses her using more positive messages and provided her with several examples she could use. Is continuing to have some room-mate issues and not much progress yet.  Trying to set some healthier boundaries and that stirs up patient's anxiety also.  Used CBT poster in office to work with patient more today on her anxiety/anxious thoughts as the visuals on poster illustrate the concept well as to how anxiety is a product of anxious thoughts.  This seemed helpful for there and she is to work between sessions on interrupting anxious thoughts, challenging them, and replacing with more positive, realistic, and empowering thoughts that do not support anxiety.    Goal review and progress noted with patient.  Next appt within 3 weeks.    Mathis Fare, LCSW

## 2019-12-23 ENCOUNTER — Other Ambulatory Visit: Payer: Self-pay | Admitting: Physician Assistant

## 2020-01-05 ENCOUNTER — Other Ambulatory Visit: Payer: Self-pay

## 2020-01-05 ENCOUNTER — Encounter: Payer: Self-pay | Admitting: Physician Assistant

## 2020-01-05 ENCOUNTER — Ambulatory Visit (INDEPENDENT_AMBULATORY_CARE_PROVIDER_SITE_OTHER): Payer: 59 | Admitting: Physician Assistant

## 2020-01-05 ENCOUNTER — Ambulatory Visit (INDEPENDENT_AMBULATORY_CARE_PROVIDER_SITE_OTHER): Payer: 59 | Admitting: Psychiatry

## 2020-01-05 DIAGNOSIS — F411 Generalized anxiety disorder: Secondary | ICD-10-CM

## 2020-01-05 DIAGNOSIS — G2581 Restless legs syndrome: Secondary | ICD-10-CM | POA: Diagnosis not present

## 2020-01-05 DIAGNOSIS — F3342 Major depressive disorder, recurrent, in full remission: Secondary | ICD-10-CM

## 2020-01-05 MED ORDER — BUPROPION HCL ER (XL) 300 MG PO TB24: 300.0000 mg | ORAL_TABLET | Freq: Every morning | ORAL | 1 refills | Status: DC

## 2020-01-05 MED ORDER — ARIPIPRAZOLE 2 MG PO TABS: 2.0000 mg | ORAL_TABLET | Freq: Every morning | ORAL | 1 refills | Status: DC

## 2020-01-05 MED ORDER — ESCITALOPRAM OXALATE 20 MG PO TABS: 20.0000 mg | ORAL_TABLET | Freq: Every day | ORAL | 1 refills | Status: DC

## 2020-01-05 NOTE — Progress Notes (Signed)
      [facility] Counselor/Therapist Progress Note  Patient ID: Helen Greene, MRN: [id number],    Date: 01/05/2020  Time Spent:  60 minutes  5:00pm to 6:00pm   Treatment Type: Individual Therapy  Reported Symptoms: anxiety and depression and both are improved more recently  Mental Status Exam:  Appearance:   Casual     Behavior:  Appropriate, Sharing and Motivated  Motor:  Normal  Speech/Language:   Clear and Coherent  Affect:  anxious  Mood:  anxious and some depression (but both have decreased)  Thought process:  goal directed  Thought content:    WNL  Sensory/Perceptual disturbances:    WNL  Orientation:  oriented to person, place, time/date, situation, day of week, month of year and year  Attention:  Good  Concentration:  Good  Memory:  WNL  Fund of knowledge:   Good  Insight:    Good  Judgment:   Good  Impulse Control:  Good and Fair   Risk Assessment: Danger to Self:  No Self-injurious Behavior: No Danger to Others: No Duty to Warn:no Physical Aggression / Violence:No  Access to Firearms a concern: No  Gang Involvement:No   Subjective: Patient today reports anxiety and depression but both have decreased some recently.   Interventions: Cognitive Behavioral Therapy and Solution-Oriented/Positive Psychology  Diagnosis:   ICD-10-CM   1. Generalized anxiety disorder  F41.1      Plan: Patient not signing tx plan on computer screen due to COVID.  Treatment Goals:Goals remain on tx plan as patient works on strategies  to meet her goals. Progress will be documented each session in "Progress" section on plan.   Long term goal: Develop the ability to recognize, accept, and cope with feelings ofanxiety anddepression.   Short term goal: Identify and replaceanxious anddepressive thinking that leads toanxious ordepressive feelings and actions.Replace with more realistic and empowering thoughts.  Strategy: Reinforce patient's positive, reality  based cognitive messages that enhance self-confidence and increase adaptive behaviors/action.  Progress: Patient in today reporting decreased anxiety and depression. Still working from home.  Room-mate still living with her and she trying to get him to help her out with expenses some more.  Has gotten more focused on her weight and wants to work on getting healthier. Also wanting to improve her self-esteem and lessen her self-doubt. Beats herself up because of her weight and that leads to using food for comfort. Worked today on not self-sabotaging her efforts to be healthier and in becoming more self-confident through more self-acceptance and (per strategy above) her more consistent use of positive, reality-based cognitive messages that enhance her view of self even though there are things she wants to change such as losing weight.  Also discussed how being less punitive towards herself will likely increase her ability to make more positive changes she is wanting. Reviewed CBT work form prior session with use illustrated poster that she easily relates to, and patient is to continue using these tools between session.  Goal review and progress noted with patient.  Next session within 3-4 weeks.    Mathis Fare, LCSW

## 2020-01-05 NOTE — Progress Notes (Signed)
[facility] Med Check  Patient ID: Helen Greene,  MRN: [id number]  PCP: Doristine Bosworth, MD  Date of Evaluation: 01/05/2020 Time spent:20 minutes  Chief Complaint:  Chief Complaint    Anxiety; Depression      HISTORY/CURRENT STATUS: HPI For routine med check.  Feels like the meds are working really well!  "I'm doing good!  I don't have anything to complain about."   She is able to enjoy things.  Energy and motivation are good.  She does not cry easily.  Memory is stable.  She is able to concentrate and do her work well in her job. The pramipexole is working well for the restless leg syndrome.  Denies suicidal or homicidal thoughts.  Anxiety is well controlled with her current medications and she rarely needs the Ativan anymore.  She will occasionally take it in the evenings if she cannot get her mind to stop racing for her to be able to go to sleep.  Patient denies increased energy with decreased need for sleep, no increased talkativeness, no racing thoughts, no impulsivity or risky behaviors, no increased spending, no increased libido, no grandiosity, no increased irritability or anger, and no hallucinations.  Denies dizziness, syncope, seizures, numbness, tingling, tremor, tics, unsteady gait, slurred speech, confusion. Denies muscle or joint pain, stiffness, or dystonia.  Individual Medical History/ Review of Systems: Changes? :No    Past medications for mental health diagnoses include: Wellbutrin XL, Risperdal caused edema, Lexapro, Mirapex, Vistaril, questionably Xanax, Abilify, Lamictal, Klonopin,pramipexole for restless legs   Allergies: Bee venom, Enablex [darifenacin hydrobromide], Erythromycin, Macrobid [nitrofurantoin monohyd macro], Metronidazole, Nitrofuran derivatives, and Risperidone and related  Current Medications:  Current Outpatient Medications:  .  ARIPiprazole (ABILIFY) 2 MG tablet, Take 1 tablet (2 mg total) by mouth every morning., Disp: 90 tablet,  Rfl: 1 .  atorvastatin (LIPITOR) 10 MG tablet, Take 1 tablet (10 mg total) by mouth every other day., Disp: 45 tablet, Rfl: 3 .  buPROPion (WELLBUTRIN XL) 300 MG 24 hr tablet, Take 1 tablet (300 mg total) by mouth every morning., Disp: 90 tablet, Rfl: 1 .  escitalopram (LEXAPRO) 20 MG tablet, Take 1 tablet (20 mg total) by mouth daily., Disp: 90 tablet, Rfl: 1 .  LORazepam (ATIVAN) 0.5 MG tablet, Take 1 tablet (0.5 mg total) by mouth every 8 (eight) hours as needed for anxiety., Disp: 90 tablet, Rfl: 1 .  omeprazole (PRILOSEC) 20 MG capsule, Take 1 capsule (20 mg total) by mouth daily., Disp: 90 capsule, Rfl: 3 .  pramipexole (MIRAPEX) 0.25 MG tablet, TAKE 1 TABLET AT BEDTIME, Disp: 90 tablet, Rfl: 0 .  telmisartan-hydrochlorothiazide (MICARDIS HCT) 40-12.5 MG tablet, Take 1 tablet by mouth daily., Disp: 90 tablet, Rfl: 1 .  valACYclovir (VALTREX) 1000 MG tablet, TAKE 2 TABLETS BY MOUTH EVERY 12 HOURS AS NEEDED FOR ONE DAY WITH OUTBREAK, Disp: 20 tablet, Rfl: 0 .  VITAMIN D PO, Take by mouth., Disp: , Rfl:  .  Ferrous Sulfate (IRON PO), Take by mouth. (Patient not taking: Reported on 01/05/2020), Disp: , Rfl:  .  fluticasone (FLONASE) 50 MCG/ACT nasal spray, Place 2 sprays into both nostrils daily. (Patient not taking: Reported on 01/05/2020), Disp: 16 g, Rfl: 5 .  mirabegron ER (MYRBETRIQ) 50 MG TB24 tablet, Take 50 mg by mouth daily. (Patient not taking: Reported on 01/05/2020), Disp: , Rfl:  .  ondansetron (ZOFRAN) 4 MG tablet, Take 1 tablet (4 mg total) by mouth every 8 (eight) hours as needed for nausea or vomiting. (  Patient not taking: Reported on 07/08/2019), Disp: 20 tablet, Rfl: 0 Medication Side Effects: none  Family Medical/ Social History: Changes? No  MENTAL HEALTH EXAM:  Last menstrual period 09/07/2015.There is no height or weight on file to calculate BMI.  General Appearance: Casual, Neat, Well Groomed and Obese  Eye Contact:  Good  Speech:  Clear and Coherent and Normal Rate   Volume:  Normal  Mood:  Euthymic  Affect:  Appropriate  Thought Process:  Goal Directed and Descriptions of Associations: Intact  Orientation:  Full (Time, Place, and Person)  Thought Content: Logical   Suicidal Thoughts:  No  Homicidal Thoughts:  No  Memory:  WNL  Judgement:  Good  Insight:  Good  Psychomotor Activity:  Normal  Concentration:  Concentration: Good and Attention Span: Good  Recall:  Good  Fund of Knowledge: Good  Language: Good  Assets:  Desire for Improvement  ADL's:  Intact  Cognition: WNL  Prognosis:  Good   09/27/2019 vitamin D was 19.4, TSH 3.2, cholesterol panel was abnormal and she was started on atorvastatin, glucose was 94.  See results on chart.  DIAGNOSES:    ICD-10-CM   1. Recurrent major depressive disorder, in full remission (HCC)  F33.42   2. Generalized anxiety disorder  F41.1   3. Restless leg syndrome  G25.81     Receiving Psychotherapy: Yes With Rockne Menghini, LCSW.   RECOMMENDATIONS:  PDMP was reviewed. I provided 20 minutes of face-to-face time during this encounter. I am glad to see her doing so well! Continue Abilify 2 mg every morning. Continue Wellbutrin XL 300 mg, 1 p.o. every morning. Continue Lexapro 20 mg daily. Continue Ativan 0.5 mg every 8 hours as needed anxiety. Continue pramipexole 0.25 mg nightly. Continue therapy with Rockne Menghini, LCSW. Return in 6 months.   Melony Overly, PA-C

## 2020-01-26 ENCOUNTER — Ambulatory Visit (INDEPENDENT_AMBULATORY_CARE_PROVIDER_SITE_OTHER): Payer: 59 | Admitting: Psychiatry

## 2020-01-26 ENCOUNTER — Other Ambulatory Visit: Payer: Self-pay

## 2020-01-26 DIAGNOSIS — F411 Generalized anxiety disorder: Secondary | ICD-10-CM | POA: Diagnosis not present

## 2020-01-26 NOTE — Progress Notes (Signed)
      [facility] Counselor/Therapist Progress Note  Patient ID: Helen Greene, MRN: [id number],    Date: 01/26/2020  Time Spent: 60 minutes   5:00pm to 6:00pm  Treatment Type: Individual Therapy  Reported Symptoms:  Anxiety, tired, mild depression  Mental Status Exam:  Appearance:   Casual     Behavior:  Appropriate, Sharing and Motivated  Motor:  Normal  Speech/Language:   Clear and Coherent  Affect:  anxious, mild depression  Mood:  anxious and depressed  Thought process:  goal directed  Thought content:    WNL  Sensory/Perceptual disturbances:    WNL  Orientation:  oriented to person, place, time/date, situation, day of week, month of year and year  Attention:  Good  Concentration:  Fair  Memory:  WNL  Fund of knowledge:   Good  Insight:    Good and Fair  Judgment:   Good and Fair  Impulse Control:  Good   Risk Assessment: Danger to Self:  No Self-injurious Behavior: No Danger to Others: No Duty to Warn:no Physical Aggression / Violence:No  Access to Firearms a concern: No  Gang Involvement:No   Subjective: Patient today reports anxiety, feeling tired, and mild depression. [name].  Interventions: Solution-Oriented/Positive Psychology and Ego-Supportive , CBT Diagnosis:   ICD-10-CM   1. Generalized anxiety disorder  F41.1     Plan: Patient not signing tx plan on computer screen due to COVID.  Treatment Goals:Goals remain on tx plan as patient works on strategies  to meet her goals. Progress will be documented each session in "Progress" section on plan.   Long term goal: Develop the ability to recognize, accept, and cope with feelings ofanxiety anddepression.   Short term goal: Identify and replaceanxious anddepressive thinking that leads toanxious ordepressive feelings and actions.Replace with more realistic and empowering thoughts.  Strategy: Reinforce patient's positive, reality based  cognitive messages that enhance self-confidence and increase adaptive behaviors/action.  Progress: Patient in today reporting anxiety, mild anxiety, and feeling tired.  Anxiety about work situations, family, and  finding herself a home and needing to move from where she has been living. Some anxiety in most recent surge in Covid.  Also shares that she has not followed through much on her goal of losing weight and getting healthier, but did recently purchase a used treadmill that she is hoping will help her be more committed.  Worked today more with some of her more frequent anxious thoughts, interrupting and challenging them, then replacing them with more reality-based, positive, encouraging , and empowering thoughts that do not support anxiety.  She agreees to work more on this strategy between appts. is to also work on improving her self talk as she is wanting to elevate her self-esteem and lessen her self-doubt.  Reviewed today some of the strategies we spoke about over the past couple of sessions in reference to not sabotaging her desire to be healthier and more self accepting/self-confident as noted in treatment plan strategy above.  Reinforced some of this through the use of CBT strategies used previously with patient and provides her with tools she can use between sessions.   Goal review and progress noted with patient.  Next appt within 3-4 weeks.   Mathis Fare, LCSW

## 2020-02-16 ENCOUNTER — Ambulatory Visit: Payer: 59 | Admitting: Psychiatry

## 2020-02-20 ENCOUNTER — Other Ambulatory Visit: Payer: Self-pay | Admitting: Physician Assistant

## 2020-03-07 ENCOUNTER — Ambulatory Visit (INDEPENDENT_AMBULATORY_CARE_PROVIDER_SITE_OTHER): Payer: 59 | Admitting: Psychiatry

## 2020-03-07 ENCOUNTER — Other Ambulatory Visit: Payer: Self-pay

## 2020-03-07 DIAGNOSIS — F411 Generalized anxiety disorder: Secondary | ICD-10-CM | POA: Diagnosis not present

## 2020-03-07 NOTE — Progress Notes (Signed)
      [facility] Counselor/Therapist Progress Note  Patient ID: Helen Greene, MRN: [id number],    Date: 03/07/2020  Time Spent: 60 minutes 10:00am to 11:00am  Treatment Type: Individual Therapy  Reported Symptoms: anxiety, stressed  Mental Status Exam:  Appearance:   Casual     Behavior:  Appropriate and Sharing  Motor:  Normal  Speech/Language:   Clear and Coherent  Affect:  anxiety, stressed  Mood:  anxious  Thought process:  goal directed  Thought content:    WNL  Sensory/Perceptual disturbances:    WNL  Orientation:  oriented to person, place, time/date, situation, day of week, month of year and year  Attention:  Good  Concentration:  Good and Fair  Memory:  WNL  Fund of knowledge:   Good  Insight:    Good and Fair  Judgment:   Good  Impulse Control:  Good   Risk Assessment: Danger to Self:  No Self-injurious Behavior: No Danger to Others: No Duty to Warn:no Physical Aggression / Violence:No  Access to Firearms a concern: No  Gang Involvement:No   Subjective: Patient today reporting anxiety, "managing some things better",  "but currently stressed with work and trying to find a house as she needs to move.  Interventions: Cognitive Behavioral Therapy and Solution-Oriented/Positive Psychology  Diagnosis:   ICD-10-CM   1. Generalized anxiety disorder  F41.1     Plan: Patient not signing tx plan on computer screen due to COVID.  Treatment Goals:Goals remain on tx plan as patient works on strategies  to meet her goals. Progress will be documented each session in "Progress" section on plan.   Long term goal: Develop the ability to recognize, accept, and cope with feelings ofanxiety anddepression.   Short term goal: Identify and replaceanxious anddepressive thinking that leads toanxious ordepressive feelings and actions.Replace with more realistic and empowering thoughts.  Strategy: Reinforce patient's positive, reality based cognitive  messages that enhance self-confidence and increase adaptive behaviors/action.  Progress: Patient in today reporting some distractedness due to increased anxiety and stress, related to work/financial stressors/looking for house.  House situation adding to her stress right now, and she is working to not make a quick decision. Anxiety reduction re: Covid although still has not chosen to get vaccination yet. Following up from last session, shared how she realizes how she unintentionally self-sabotages "and shares today that she had recent insight that her mom was the same way in being negative and self-sabotaging."  Patient working to catch her self-sabotaging thoughts and behaviors, especially procrastination and "assuming the worst."  Gave several examples of this that has occurred since last appt. Used CBT strategies and long and short term goal today to work with these examples, interrupting them, challenging them, and replacing anxious thought patterns with more reality-based, positive, and empowering thoughts that do not support anxiety nor negativity. Wants to continue focus on more exercise and weight reduction, decrease her procrastination, and improve her self-talk and self-acceptance to be more positive. Patient to continue more work on this between sessions and will follow up next session.  Goal review and progress/challenges noted with patient.  Next appt within 3 weeks.    Mathis Fare, LCSW

## 2020-03-08 ENCOUNTER — Other Ambulatory Visit: Payer: Self-pay

## 2020-03-08 ENCOUNTER — Encounter: Payer: Self-pay | Admitting: Obstetrics & Gynecology

## 2020-03-08 ENCOUNTER — Ambulatory Visit (INDEPENDENT_AMBULATORY_CARE_PROVIDER_SITE_OTHER): Payer: Managed Care, Other (non HMO) | Admitting: Obstetrics & Gynecology

## 2020-03-08 VITALS — BP 124/78 | Ht 59.0 in | Wt 215.0 lb

## 2020-03-08 DIAGNOSIS — Z90710 Acquired absence of both cervix and uterus: Secondary | ICD-10-CM | POA: Diagnosis not present

## 2020-03-08 DIAGNOSIS — Z113 Encounter for screening for infections with a predominantly sexual mode of transmission: Secondary | ICD-10-CM

## 2020-03-08 DIAGNOSIS — Z01419 Encounter for gynecological examination (general) (routine) without abnormal findings: Secondary | ICD-10-CM | POA: Diagnosis not present

## 2020-03-08 DIAGNOSIS — Z6841 Body Mass Index (BMI) 40.0 and over, adult: Secondary | ICD-10-CM

## 2020-03-08 NOTE — Progress Notes (Signed)
Helen Greene April 06, 1971 [id number]   History:    49 y.o. [id number] Divorced  RP:  Established patient presenting for annual gyn exam   HPI: S/P Total Hysterectomy. No menopausal Sx. No pelvic pain.  No vaginal d/c. Had IC without condoms last week. Urine/BMs normal. Breasts Normal. BMI increased to 43.42.  Trying to lower calorie/carb diet. On an appetite suppressant through her Fam MD, forgot the name of the medication. Treadmill at home, started 30 min/day.  Health labs with Fam MD.  Past medical history,surgical history, family history and social history were all reviewed and documented in the EPIC chart.  Gynecologic History Patient's last menstrual period was 09/07/2015 (exact date).  Obstetric History OB History  Gravida Para Term Preterm AB Living  3 2     1 2   SAB TAB Ectopic Multiple Live Births      1        # Outcome Date GA Lbr Len/2nd Weight Sex Delivery Anes PTL Lv  3 Ectopic           2 Para           1 Para              ROS: A ROS was performed and pertinent positives and negatives are included in the history.  GENERAL: No fevers or chills. HEENT: No change in vision, no earache, sore throat or sinus congestion. NECK: No pain or stiffness. CARDIOVASCULAR: No chest pain or pressure. No palpitations. PULMONARY: No shortness of breath, cough or wheeze. GASTROINTESTINAL: No abdominal pain, nausea, vomiting or diarrhea, melena or bright red blood per rectum. GENITOURINARY: No urinary frequency, urgency, hesitancy or dysuria. MUSCULOSKELETAL: No joint or muscle pain, no back pain, no recent trauma. DERMATOLOGIC: No rash, no itching, no lesions. ENDOCRINE: No polyuria, polydipsia, no heat or cold intolerance. No recent change in weight. HEMATOLOGICAL: No anemia or easy bruising or bleeding. NEUROLOGIC: No headache, seizures, numbness, tingling or weakness. PSYCHIATRIC: No depression, no loss of interest in normal activity or change in sleep pattern.      Exam:   BP 124/78 (BP Location: Right Arm, Patient Position: Sitting, Cuff Size: Large)   Ht 4\' 11"  (1.499 m)   Wt 215 lb (97.5 kg)   LMP 09/07/2015 (Exact Date)   BMI 43.42 kg/m   Body mass index is 43.42 kg/m.  General appearance : Well developed well nourished female. No acute distress HEENT: Eyes: no retinal hemorrhage or exudates,  Neck supple, trachea midline, no carotid bruits, no thyroidmegaly Lungs: Clear to auscultation, no rhonchi or wheezes, or rib retractions  Heart: Regular rate and rhythm, no murmurs or gallops Breast:Examined in sitting and supine position were symmetrical in appearance, no palpable masses or tenderness,  no skin retraction, no nipple inversion, no nipple discharge, no skin discoloration, no axillary or supraclavicular lymphadenopathy Abdomen: no palpable masses or tenderness, no rebound or guarding Extremities: no edema or skin discoloration or tenderness  Pelvic: Vulva: Normal             Vagina: No gross lesions or discharge  Cervix: No gross lesions or discharge. Pap/HPV HR/Gono-Chlam done.  Uterus AV, normal size, shape and consistency, non-tender and mobile  Adnexa  Without masses or tenderness  Anus: Normal   Assessment/Plan:  49 y.o. female for annual exam   1. Encounter for routine gynecological examination with Papanicolaou smear of cervix Gynecologic exam s/p Total Hysterectomy.  Pap/HPV HR done at the vaginal vault.  Breasts normal.  Will schedule Screening mammo.  Colono to schedule thru her Fam MD.  Health labs with Fam MD.  2. S/P total hysterectomy  3. Screen for STD (sexually transmitted disease) Strict condom use strongly recommended. - Gono-Chlam on Pap - HIV antibody (with reflex) - RPR - Hepatitis B Surface AntiGEN - Hepatitis C Antibody  4. Morbid obesity with BMI of 40.0-44.9, adult (HCC) Low calorie/carb diet such as [employer].  Aerobic activities 5 times a week, treadmill/walks to continue. Light weight  lifting every 2 days.  Genia Del MD, 4:08 PM 03/08/2020

## 2020-03-09 NOTE — Addendum Note (Signed)
Addended by: Tito Dine on: 03/09/2020 09:53 AM   Modules accepted: Orders

## 2020-03-10 LAB — RPR: RPR Ser Ql: NONREACTIVE

## 2020-03-10 LAB — HIV ANTIBODY (ROUTINE TESTING W REFLEX): HIV 1&2 Ab, 4th Generation: NONREACTIVE

## 2020-03-10 LAB — HEPATITIS C ANTIBODY
Hepatitis C Ab: NONREACTIVE
SIGNAL TO CUT-OFF: 0.01 (ref <1.00)

## 2020-03-10 LAB — HEPATITIS B SURFACE ANTIGEN: Hepatitis B Surface Ag: NONREACTIVE

## 2020-03-11 LAB — PAP IG, CT-NG NAA, HPV HIGH-RISK
C. trachomatis RNA, TMA: NOT DETECTED
HPV DNA High Risk: NOT DETECTED
N. gonorrhoeae RNA, TMA: NOT DETECTED

## 2020-03-13 MED ORDER — CLINDAMYCIN PHOSPHATE 2 % VA CREA: 1.0000 | TOPICAL_CREAM | Freq: Every day | VAGINAL | 0 refills | Status: AC

## 2020-03-13 NOTE — Telephone Encounter (Signed)
Dr. Seymour Bars- I received warning of contraindication/allergy to Metronidazole when I went to enter the [facility]. Please advise.

## 2020-03-16 ENCOUNTER — Other Ambulatory Visit (HOSPITAL_COMMUNITY): Payer: Self-pay | Admitting: Family Medicine

## 2020-03-16 ENCOUNTER — Telehealth: Payer: Self-pay

## 2020-03-16 DIAGNOSIS — R0789 Other chest pain: Secondary | ICD-10-CM

## 2020-03-16 NOTE — Telephone Encounter (Signed)
NOTES ON FILE FROM DR ZOE STALLINGS [phone] SENT REFERRAL TO SCHEDULING

## 2020-03-19 ENCOUNTER — Emergency Department (HOSPITAL_COMMUNITY): Payer: Managed Care, Other (non HMO)

## 2020-03-19 ENCOUNTER — Emergency Department (HOSPITAL_COMMUNITY)
Admission: EM | Admit: 2020-03-19 | Discharge: 2020-03-19 | Disposition: A | Discharge status: Home / Self Care | Payer: Managed Care, Other (non HMO) | Attending: Emergency Medicine | Admitting: Emergency Medicine

## 2020-03-19 ENCOUNTER — Other Ambulatory Visit: Payer: Self-pay

## 2020-03-19 ENCOUNTER — Encounter (HOSPITAL_COMMUNITY): Payer: Self-pay | Admitting: Emergency Medicine

## 2020-03-19 DIAGNOSIS — R1032 Left lower quadrant pain: Secondary | ICD-10-CM | POA: Insufficient documentation

## 2020-03-19 DIAGNOSIS — K219 Gastro-esophageal reflux disease without esophagitis: Secondary | ICD-10-CM | POA: Insufficient documentation

## 2020-03-19 DIAGNOSIS — N200 Calculus of kidney: Secondary | ICD-10-CM | POA: Diagnosis not present

## 2020-03-19 DIAGNOSIS — I10 Essential (primary) hypertension: Secondary | ICD-10-CM | POA: Insufficient documentation

## 2020-03-19 LAB — BASIC METABOLIC PANEL
Anion gap: 10 (ref 5–15)
BUN: 26 mg/dL — ABNORMAL HIGH (ref 6–20)
CO2: 24 mmol/L (ref 22–32)
Calcium: 9.1 mg/dL (ref 8.9–10.3)
Chloride: 106 mmol/L (ref 98–111)
Creatinine, Ser: 0.95 mg/dL (ref 0.44–1.00)
GFR calc Af Amer: >60 mL/min (ref >60)
GFR calc non Af Amer: >60 mL/min (ref >60)
Glucose, Bld: 117 mg/dL — ABNORMAL HIGH (ref 70–99)
Potassium: 3.7 mmol/L (ref 3.5–5.1)
Sodium: 140 mmol/L (ref 135–145)

## 2020-03-19 LAB — URINALYSIS, ROUTINE W REFLEX MICROSCOPIC
Bacteria, UA: NONE SEEN
Bilirubin Urine: NEGATIVE
Glucose, UA: NEGATIVE mg/dL
Ketones, ur: NEGATIVE mg/dL
Leukocytes,Ua: NEGATIVE
Nitrite: NEGATIVE
Protein, ur: 30 mg/dL — AB
RBC / HPF: >50 RBC/hpf — ABNORMAL HIGH (ref 0–5)
Specific Gravity, Urine: 1.024 (ref 1.005–1.030)
pH: 5 (ref 5.0–8.0)

## 2020-03-19 LAB — CBC WITH DIFFERENTIAL/PLATELET
Abs Immature Granulocytes: 0.03 10*3/uL (ref 0.00–0.07)
Basophils Absolute: 0.1 10*3/uL (ref 0.0–0.1)
Basophils Relative: 0 %
Eosinophils Absolute: 0.1 10*3/uL (ref 0.0–0.5)
Eosinophils Relative: 1 %
HCT: 41.3 % (ref 36.0–46.0)
Hemoglobin: 13.7 g/dL (ref 12.0–15.0)
Immature Granulocytes: 0 %
Lymphocytes Relative: 24 %
Lymphs Abs: 3 10*3/uL (ref 0.7–4.0)
MCH: 30.5 pg (ref 26.0–34.0)
MCHC: 33.2 g/dL (ref 30.0–36.0)
MCV: 92 fL (ref 80.0–100.0)
Monocytes Absolute: 0.8 10*3/uL (ref 0.1–1.0)
Monocytes Relative: 7 %
Neutro Abs: 8.6 10*3/uL — ABNORMAL HIGH (ref 1.7–7.7)
Neutrophils Relative %: 68 %
Platelets: 286 10*3/uL (ref 150–400)
RBC: 4.49 MIL/uL (ref 3.87–5.11)
RDW: 12.3 % (ref 11.5–15.5)
WBC: 12.7 10*3/uL — ABNORMAL HIGH (ref 4.0–10.5)
nRBC: 0 % (ref 0.0–0.2)

## 2020-03-19 MED ORDER — KETOROLAC TROMETHAMINE 30 MG/ML IJ SOLN
30.0000 mg | Freq: Once | INTRAMUSCULAR | Status: AC
  Administered 2020-03-19: 30 mg via INTRAVENOUS
  Filled 2020-03-19: qty 1

## 2020-03-19 MED ORDER — ONDANSETRON HCL 4 MG/2ML IJ SOLN
4.0000 mg | Freq: Once | INTRAMUSCULAR | Status: AC
  Administered 2020-03-19: 4 mg via INTRAVENOUS
  Filled 2020-03-19: qty 2

## 2020-03-19 MED ORDER — LIDOCAINE VISCOUS HCL 2 % MT SOLN
15.0000 mL | Freq: Once | OROMUCOSAL | Status: AC
  Administered 2020-03-19: 15 mL via ORAL
  Filled 2020-03-19: qty 15

## 2020-03-19 MED ORDER — TAMSULOSIN HCL 0.4 MG PO CAPS
0.4000 mg | ORAL_CAPSULE | Freq: Every day | ORAL | 0 refills | Status: DC
Start: 2020-03-19 — End: 2020-11-29

## 2020-03-19 MED ORDER — OXYCODONE-ACETAMINOPHEN 5-325 MG PO TABS: 1.0000 | ORAL_TABLET | Freq: Four times a day (QID) | ORAL | 0 refills | Status: DC | PRN

## 2020-03-19 MED ORDER — ALUM & MAG HYDROXIDE-SIMETH 200-200-20 MG/5ML PO SUSP
30.0000 mL | Freq: Once | ORAL | Status: AC
  Administered 2020-03-19: 30 mL via ORAL
  Filled 2020-03-19: qty 30

## 2020-03-19 MED ORDER — MORPHINE SULFATE (PF) 4 MG/ML IV SOLN
4.0000 mg | Freq: Once | INTRAVENOUS | Status: AC
  Administered 2020-03-19: 4 mg via INTRAVENOUS
  Filled 2020-03-19: qty 1

## 2020-03-19 NOTE — Discharge Instructions (Addendum)
Begin taking Percocet as prescribed for pain.  Begin taking Flomax as prescribed as this may help you to pass the stone.  Follow-up with [facility] if you have not passed the stone in the next 3 days.  The contact information for their office has been provided in this discharge summary for you to call and make these arrangements.  Return to the emergency department in the meantime if you develop worsening pain, high fever, or other new and concerning symptoms.

## 2020-03-19 NOTE — ED Provider Notes (Signed)
[name] Provider Note   CSN: [id number] Arrival date & time: 03/19/20  3212     History Chief Complaint  Patient presents with  . Flank Pain    Helen Greene is a 49 y.o. female.  Patient is a 49 year old female with a past medical history of hypertension, depression, prior renal calculi.  She presents today for evaluation of left flank pain.  This started approximately 1 AM and woke her from sleep.  She describes severe pain in the left flank radiating into the left lower quadrant.  She is nauseated, but has not vomited.  She denies any bowel or bladder complaints.  She denies any fevers or chills.  The history is provided by the patient.  Flank Pain This is a new problem. The current episode started 3 to 5 hours ago. The problem occurs constantly. The problem has been rapidly worsening. Associated symptoms include abdominal pain. Nothing aggravates the symptoms. Nothing relieves the symptoms. She has tried nothing for the symptoms.       Past Medical History:  Diagnosis Date  . Allergy   . Anxiety   . Depression   . Elevated white blood cell count   . Elevated white blood cell count 09/25/2011  . GERD (gastroesophageal reflux disease)   . Headache    Migraine  . Hypertension   . Kidney stone   . Neuromuscular disorder (HCC)    carpel tunnel disease  . Pneumonia 08/15/2017  . Restless leg syndrome   . Shortness of breath dyspnea    with exertion  . Sleep apnea    does not have CPAP machine, mostly affects back sleeping was encouraged to sleep on her sides  . Tonsillar calculus     Patient Active Problem List   Diagnosis Date Noted  . Seasonal allergic rhinitis 10/09/2018  . Major depressive disorder, recurrent episode, moderate (HCC) 04/01/2018  . Hypertension 08/15/2017  . Morbid obesity with BMI of 40.0-44.9, adult (HCC) 08/15/2017  . S/P tonsillectomy 08/11/2017  . S/P total hysterectomy 09/14/2015  . RLS (restless legs  syndrome) 03/17/2015  . Essential hypertension 05/19/2013    Past Surgical History:  Procedure Laterality Date  . COLONOSCOPY    . DILATION AND CURETTAGE OF UTERUS  1993  . ECTOPIC PREGNANCY SURGERY    . ESSURE TUBAL LIGATION    . LAPAROSCOPIC CHOLECYSTECTOMY W/ CHOLANGIOGRAPHY  02/25/2001   Dr Lindie Spruce  . LITHOTRIPSY  12/22/2014  . ROBOTIC ASSISTED TOTAL HYSTERECTOMY WITH SALPINGECTOMY Left 09/14/2015   Procedure: ROBOTIC ASSISTED TOTAL HYSTERECTOMY WITH LEFT SALPINGECTOMY;  Surgeon: Noland Fordyce, MD;  Location: [facility];  Service: Gynecology;  Laterality: Left;  . TONSILLECTOMY N/A 08/11/2017   Procedure: TONSILLECTOMY;  Surgeon: Serena Colonel, MD;  Location: [facility];  Service: ENT;  Laterality: N/A;  . UPPER GI ENDOSCOPY    . WISDOM TOOTH EXTRACTION       OB History    Gravida  3   Para  2   Term      Preterm      AB  1   Living  2     SAB      TAB      Ectopic  1   Multiple      Live Births              Family History  Problem Relation Age of Onset  . Hypertension Mother   . Heart failure Mother   . Cancer Mother   .  Heart disease Mother   . Heart disease Brother   . Hypertension Brother   . Cancer Father        Leukemia  . Hypertension Father   . Cancer Sister   . Breast cancer Sister   . Diabetes Other   . Colon cancer Neg Hx   . Esophageal cancer Neg Hx   . Rectal cancer Neg Hx   . Stomach cancer Neg Hx     Social History   Tobacco Use  . Smoking status: Never Smoker  . Smokeless tobacco: Never Used  Vaping Use  . Vaping Use: Never used  Substance Use Topics  . Alcohol use: Yes    Alcohol/week: 1.0 standard drink    Types: 1 Glasses of wine per week    Comment: occas  . Drug use: No    Home Medications Prior to Admission medications   Medication Sig Start Date End Date Taking? Authorizing Provider  ARIPiprazole (ABILIFY) 2 MG tablet Take 1 tablet (2 mg total) by mouth every morning. 01/05/20   Melony Overly T,  PA-C  atorvastatin (LIPITOR) 10 MG tablet Take 1 tablet (10 mg total) by mouth every other day. 09/28/19   Doristine Bosworth, MD  buPROPion (WELLBUTRIN XL) 300 MG 24 hr tablet Take 1 tablet (300 mg total) by mouth every morning. 01/05/20   Melony Overly T, PA-C  clindamycin (CLEOCIN) 2 % vaginal cream Place 1 Applicatorful vaginally at bedtime for 7 days. 03/13/20 03/20/20  Genia Del, MD  escitalopram (LEXAPRO) 20 MG tablet Take 1 tablet (20 mg total) by mouth daily. 01/05/20   Melony Overly T, PA-C  Ferrous Sulfate (IRON PO) Take by mouth.     [provider]  fluticasone (FLONASE) 50 MCG/ACT nasal spray Place 2 sprays into both nostrils daily. 01/29/18   Leslye Peer, MD  LORazepam (ATIVAN) 0.5 MG tablet Take 1 tablet (0.5 mg total) by mouth every 8 (eight) hours as needed for anxiety. 11/24/18   Cherie Ouch, PA-C  omeprazole (PRILOSEC) 20 MG capsule Take 1 capsule (20 mg total) by mouth daily. 09/27/19   Doristine Bosworth, MD  ondansetron (ZOFRAN) 4 MG tablet Take 1 tablet (4 mg total) by mouth every 8 (eight) hours as needed for nausea or vomiting. 11/21/17   Doristine Bosworth, MD  pramipexole (MIRAPEX) 0.25 MG tablet TAKE 1 TABLET AT BEDTIME 02/21/20   Hurst, Rosey Bath T, PA-C  telmisartan-hydrochlorothiazide (MICARDIS HCT) 40-12.5 MG tablet Take 1 tablet by mouth daily. 09/27/19   Doristine Bosworth, MD  valACYclovir (VALTREX) 1000 MG tablet TAKE 2 TABLETS BY MOUTH EVERY 12 HOURS AS NEEDED FOR ONE DAY WITH OUTBREAK 12/12/17   Doristine Bosworth, MD  VITAMIN D PO Take by mouth.    [provider]    Allergies    Bee venom, Enablex [darifenacin hydrobromide], Erythromycin, Macrobid [nitrofurantoin monohyd macro], Metronidazole, Nitrofuran derivatives, and Risperidone and related  Review of Systems   Review of Systems  Gastrointestinal: Positive for abdominal pain.  Genitourinary: Positive for flank pain.  All other systems reviewed and are negative.   Physical Exam Updated  Vital Signs BP (!) 185/122 (BP Location: Left Arm)   Pulse 76   Temp 97.7 F (36.5 C) (Oral)   Resp 16   LMP 09/07/2015 (Exact Date)   SpO2 99%   Physical Exam Vitals and nursing note reviewed.  Constitutional:      General: She is not in acute distress.    Appearance: She is  well-developed. She is not diaphoretic.  HENT:     Head: Normocephalic and atraumatic.  Cardiovascular:     Rate and Rhythm: Normal rate and regular rhythm.     Heart sounds: No murmur heard.  No friction rub. No gallop.   Pulmonary:     Effort: Pulmonary effort is normal. No respiratory distress.     Breath sounds: Normal breath sounds. No wheezing.  Abdominal:     General: Bowel sounds are normal. There is no distension.     Palpations: Abdomen is soft.     Tenderness: There is no abdominal tenderness. There is left CVA tenderness.  Musculoskeletal:        General: Normal range of motion.     Cervical back: Normal range of motion and neck supple.  Skin:    General: Skin is warm and dry.  Neurological:     Mental Status: She is alert and oriented to person, place, and time.     ED Results / Procedures / Treatments   Labs (all labs ordered are listed, but only abnormal results are displayed) Labs Reviewed  URINALYSIS, ROUTINE W REFLEX MICROSCOPIC - Abnormal; Notable for the following components:      Result Value   APPearance HAZY (*)    Hgb urine dipstick LARGE (*)    Protein, ur 30 (*)    RBC / HPF >50 (*)    All other components within normal limits  BASIC METABOLIC PANEL  CBC WITH DIFFERENTIAL/PLATELET    EKG None  Radiology No results found.  Procedures Procedures (including critical care time)  Medications Ordered in ED Medications  ondansetron (ZOFRAN) injection 4 mg (has no administration in time range)  morphine 4 MG/ML injection 4 mg (has no administration in time range)  ketorolac (TORADOL) 30 MG/ML injection 30 mg (has no administration in time range)    ED Course    I have reviewed the triage vital signs and the nursing notes.  Pertinent labs & imaging results that were available during my care of the patient were reviewed by me and considered in my medical decision making (see chart for details).    MDM Rules/Calculators/A&P  Patient presenting here with complaints of left flank pain that appears to be caused by a kidney stone.  She has a 5 to 6 mm stone in the proximal left ureter.  She is feeling better after receiving medicines here in the ER, but is complaining of heartburn for which she was given a GI cocktail.  Patient seems appropriate for discharge with follow-up with urology if the stone has not passed in the next few days.  She will be given pain medication and Flomax.  To return as needed for any problems.  She is afebrile with no sign of infection in her urine and nontoxic appearing.  Final Clinical Impression(s) / ED Diagnoses Final diagnoses:  None    Rx / DC Orders ED Discharge Orders    None       Geoffery Lyons, MD 03/19/20 [phone]

## 2020-03-19 NOTE — ED Triage Notes (Addendum)
Pt reports "kidney pain"- states that it feels like when she had kidney stones. States that the pain is on both sides of her back and radiates around to her L side. Started at 1a and took tylenol at 130a without improvement. States that she had lithotripsy last time. A&Ox4. Ambulatory. BP elevated with a hx of HTN. States that she takes her meds in the AM.

## 2020-03-20 ENCOUNTER — Other Ambulatory Visit (HOSPITAL_COMMUNITY)
Admission: RE | Admit: 2020-03-20 | Discharge: 2020-03-20 | Disposition: A | Discharge status: Home / Self Care | Payer: Managed Care, Other (non HMO) | Source: Ambulatory Visit | Attending: Family Medicine | Admitting: Family Medicine

## 2020-03-20 DIAGNOSIS — Z01812 Encounter for preprocedural laboratory examination: Secondary | ICD-10-CM | POA: Diagnosis present

## 2020-03-20 DIAGNOSIS — Z20822 Contact with and (suspected) exposure to covid-19: Secondary | ICD-10-CM | POA: Diagnosis not present

## 2020-03-20 LAB — SARS CORONAVIRUS 2 (TAT 6-24 HRS): SARS Coronavirus 2: NEGATIVE

## 2020-03-22 ENCOUNTER — Telehealth (HOSPITAL_COMMUNITY): Payer: Self-pay | Admitting: *Deleted

## 2020-03-22 NOTE — Telephone Encounter (Signed)
Close encounter 

## 2020-03-23 ENCOUNTER — Ambulatory Visit (HOSPITAL_COMMUNITY)
Admission: RE | Admit: 2020-03-23 | Discharge: 2020-03-23 | Disposition: A | Discharge status: Home / Self Care | Payer: Managed Care, Other (non HMO) | Source: Ambulatory Visit | Attending: Cardiology | Admitting: Cardiology

## 2020-03-23 ENCOUNTER — Other Ambulatory Visit: Payer: Self-pay

## 2020-03-23 DIAGNOSIS — R0789 Other chest pain: Secondary | ICD-10-CM | POA: Insufficient documentation

## 2020-03-23 LAB — EXERCISE TOLERANCE TEST
Estimated workload: 7.8 METS
Exercise duration (min): 6 min
Exercise duration (sec): 31 s
MPHR: 170 {beats}/min
Peak HR: 160 {beats}/min
Percent HR: 94 %
Rest HR: 90 {beats}/min

## 2020-03-30 ENCOUNTER — Ambulatory Visit: Payer: 59 | Admitting: Psychiatry

## 2020-04-07 ENCOUNTER — Other Ambulatory Visit: Payer: Self-pay | Admitting: Emergency Medicine

## 2020-04-07 DIAGNOSIS — Z760 Encounter for issue of repeat prescription: Secondary | ICD-10-CM

## 2020-04-07 NOTE — Telephone Encounter (Signed)
Medication Refill - Medication: telmisartan-hydrochlorothiazide (MICARDIS HCT) 40-12.5 MG tablet    Has the patient contacted their pharmacy? Yes.   (Agent: If no, request that the patient contact the pharmacy for the refill.) (Agent: If yes, when and what did the pharmacy advise?)  Preferred Pharmacy (with phone number or street name):  [facility] - [place], [place] - [address] AT Portal to Registered [employer] Sites  [address] [place] [address]  Phone: [phone] Fax: [phone]     Ref [phone]  Agent: Please be advised that RX refills may take up to 3 business days. We ask that you follow-up with your pharmacy.

## 2020-04-07 NOTE — Telephone Encounter (Signed)
Requested medication (s) are due for refill today: yes  Requested medication (s) are on the active medication list: yes   Last refill:  09/27/19 #90 1 refill  Future visit scheduled: no  Notes to clinic:   can we give [facility] requesting 3 month supply, or 30 day courtesy refill until patient schedules appt ?     Requested Prescriptions  Pending Prescriptions Disp Refills   telmisartan-hydrochlorothiazide (MICARDIS HCT) 40-12.5 MG tablet 90 tablet 1    Sig: Take 1 tablet by mouth daily.      Cardiovascular: ARB + Diuretic Combos Failed - 04/07/2020  4:20 PM      Failed - Valid encounter within last 6 months    Recent Outpatient Visits           6 months ago Fatigue due to depression   [facility] at [facility], Manus Rudd, MD   12 months ago Infection of mouth   [facility] at [facility], [place], MD   1 year ago Essential hypertension   Primary Care at [facility], [place], MD   1 year ago Seasonal allergic rhinitis, unspecified trigger   Primary Care at [facility], Minerva Fester, MD   2 years ago DOE (dyspnea on exertion)   [facility] at [facility], Manus Rudd, MD              Passed - K in normal range and within 180 days    Potassium  Date Value Ref Range Status  03/19/2020 3.7 3.5 - 5.1 mmol/L Final          Passed - Na in normal range and within 180 days    Sodium  Date Value Ref Range Status  03/19/2020 140 135 - 145 mmol/L Final  09/27/2019 140 134 - 144 mmol/L Final          Passed - Cr in normal range and within 180 days    Creat  Date Value Ref Range Status  02/08/2015 0.72 0.50 - 1.10 mg/dL Final   Creatinine, Ser  Date Value Ref Range Status  03/19/2020 0.95 0.44 - 1.00 mg/dL Final   Creatinine, Urine  Date Value Ref Range Status  08/15/2017 106.21 mg/dL Final    Comment:    Performed at [facility], [address] [place], [place] [address]          Passed - Ca in normal range and within 180 days     Calcium  Date Value Ref Range Status  03/19/2020 9.1 8.9 - 10.3 mg/dL Final          Passed - Patient is not pregnant      Passed - Last BP in normal range    BP Readings from Last 1 Encounters:  03/19/20 127/69

## 2020-04-10 MED ORDER — TELMISARTAN-HCTZ 40-12.5 MG PO TABS: 1.0000 | ORAL_TABLET | Freq: Every day | ORAL | 0 refills | Status: DC

## 2020-04-19 ENCOUNTER — Other Ambulatory Visit: Payer: Self-pay

## 2020-04-19 ENCOUNTER — Ambulatory Visit (INDEPENDENT_AMBULATORY_CARE_PROVIDER_SITE_OTHER): Payer: 59 | Admitting: Psychiatry

## 2020-04-19 DIAGNOSIS — F411 Generalized anxiety disorder: Secondary | ICD-10-CM | POA: Diagnosis not present

## 2020-04-19 NOTE — Progress Notes (Deleted)
      [facility] Counselor/Therapist Progress Note  Patient ID: Helen Greene, MRN: [id number],    Date: 04/19/2020  Time Spent: ***   Treatment Type: {CHL AMB THERAPY TYPES:[id number]}  Reported Symptoms: ***  Mental Status Exam:  Appearance:   {PSY:22683}     Behavior:  {PSY:21022743}  Motor:  {PSY:22302}  Speech/Language:   {PSY:22685}  Affect:  {PSY:22687}  Mood:  {PSY:31886}  Thought process:  {PSY:31888}  Thought content:    {PSY:[phone]}  Sensory/Perceptual disturbances:    {[phone]}  Orientation:  {PSY:30297}  Attention:  {PSY:22877}  Concentration:  {PSY:838-144-8482}  Memory:  {PSY:212-220-1134}  Fund of knowledge:   {PSY:838-144-8482}  Insight:    {PSY:838-144-8482}  Judgment:   {PSY:838-144-8482}  Impulse Control:  {PSY:838-144-8482}   Risk Assessment: Danger to Self:  {PSY:22692} Self-injurious Behavior: {PSY:22692} Danger to Others: {PSY:22692} Duty to Warn:{PSY:311194} Physical Aggression / Violence:{PSY:21197} Access to Firearms a concern: {PSY:21197} Gang Involvement:{PSY:21197}  Subjective: ***   Interventions: {[phone]}  Diagnosis:No diagnosis found.  Plan: ***  Mathis Fare, LCSW

## 2020-04-19 NOTE — Progress Notes (Signed)
      [facility] Counselor/Therapist Progress Note  Patient ID: Helen Greene, MRN: [id number],    Date: 04/19/2020  Time Spent: .60 minutes   5:00pm to 6:00pm  Treatment Type: Individual Therapy  Reported Symptoms: anxiety  Mental Status Exam:  Appearance:   Casual     Behavior:  Appropriate, Sharing and Motivated  Motor:  Normal  Speech/Language:   Clear and Coherent  Affect:  anxious  Mood:  anxious  Thought process:  goal directed  Thought content:    WNL  Sensory/Perceptual disturbances:    WNL  Orientation:  oriented to person, place, time/date, situation, day of week, month of year and year  Attention:  Good  Concentration:  Fair  Memory:  WNL  Fund of knowledge:   Good  Insight:    Good  Judgment:   Good  Impulse Control:  Good   Risk Assessment: Danger to Self:  No Self-injurious Behavior: No Danger to Others: No Duty to Warn:no Physical Aggression / Violence:No  Access to Firearms a concern: No  Gang Involvement:No    Subjective: Patient today reports anxiety but "I'm also improving." Feels her outlook has also improved. Trying to buy a house, stressful but hopeful.  Interventions: Cognitive Behavioral Therapy, Solution-Oriented/Positive Psychology and Ego-Supportive  Diagnosis:   ICD-10-CM   1. Generalized anxiety disorder  F41.1      Plan: Patient not signing tx plan on computer screen due to COVID.  Treatment Goals:Goals remain on tx plan as patient works on strategies  to meet her goals. Progress will be documented each session in "Progress" section on plan.   Long term goal: Develop the ability to recognize, accept, and cope with feelings ofanxiety anddepression.   Short term goal: Identify and replaceanxious anddepressive thinking that leads toanxious ordepressive feelings and actions.Replace with more realistic and empowering thoughts.  Strategy: Reinforce patient's positive, reality based cognitive messages that enhance  self-confidence and increase adaptive behaviors/action.  Progress: Patient in today reporting anxiety as main symptom.  Anxieties about work re: vaccine, personal, trying to buy a home for the first time on her own, and her long time dog who has had serious health issues. Processed her concerns about each of these, which was helpful to patient in separating them out and prioritizing her concerns which seems to help her not feel overwhelmed.  Is concerned that her work has now mandated the vaccine by Dec. 8th. Does plan to get it but has mixed feelings which she expresses in session today.  Also has appt with her doctor tomorrow and plans to ask re: the vaccine to get her opinion. Self-sabotaging has decreased. States she doesn't "assume the worst" as much now but still working to stop procrastinating.  Self-confidence improved some. Worked with her short-term goal and strategy in her treatment plan above and identifying recurrent anxious/depressive thoughts that still arise, and replacing them with more reality based and encouraging thoughts that do not support anxiety nor depression, and can help boost her self-confidence.  Encouraged patient to continue working on the strategies in between sessions.  Goal review and progress/challenges noted with patient.  Next appointment within 4 weeks.   Mathis Fare, LCSW

## 2020-05-02 ENCOUNTER — Other Ambulatory Visit: Payer: Self-pay

## 2020-05-02 ENCOUNTER — Ambulatory Visit (INDEPENDENT_AMBULATORY_CARE_PROVIDER_SITE_OTHER): Payer: Managed Care, Other (non HMO) | Admitting: Nurse Practitioner

## 2020-05-02 ENCOUNTER — Encounter: Payer: Self-pay | Admitting: Nurse Practitioner

## 2020-05-02 VITALS — BP 118/78 | Ht 59.0 in | Wt 211.0 lb

## 2020-05-02 DIAGNOSIS — N898 Other specified noninflammatory disorders of vagina: Secondary | ICD-10-CM | POA: Diagnosis not present

## 2020-05-02 DIAGNOSIS — Z113 Encounter for screening for infections with a predominantly sexual mode of transmission: Secondary | ICD-10-CM

## 2020-05-02 DIAGNOSIS — Z7251 High risk heterosexual behavior: Secondary | ICD-10-CM | POA: Diagnosis not present

## 2020-05-02 LAB — WET PREP FOR TRICH, YEAST, CLUE

## 2020-05-02 NOTE — Progress Notes (Signed)
   Acute Office Visit  Subjective:    Patient ID: Helen Greene, female    DOB: [birth year], 49 y.o.   MRN: [id number]   HPI 49 y.o. presents today for vaginal itching and slight increase in white discharge.  Was treated recently for UTI and took Diflucan for yeast prevention. After this she developed vaginal itching and took Diflucan that she had at home but symptoms did not improve.  Had intercourse with her ex boyfriend a week and a half ago and did not use protection.  Would like STD screening today, gonorrhea/chlamydia only per request.   Review of Systems  Constitutional: Negative.   Genitourinary: Positive for vaginal discharge. Negative for dysuria, frequency and urgency.       Vaginal itching just inside introitus       Objective:    Physical Exam Constitutional:      Appearance: Normal appearance.  Genitourinary:    General: Normal vulva.     Vagina: Normal.     Comments: Cervix absent    BP 118/78 (BP Location: Right Arm, Patient Position: Sitting, Cuff Size: Large)   Ht 4\' 11"  (1.499 m)   Wt 211 lb (95.7 kg)   LMP 09/07/2015 (Exact Date)   BMI 42.62 kg/m  Wt Readings from Last 3 Encounters:  05/02/20 211 lb (95.7 kg)  03/08/20 215 lb (97.5 kg)  09/27/19 211 lb 6.4 oz (95.9 kg)   Wet prep negative     Assessment & Plan:   Problem List Items Addressed This Visit    None    Visit Diagnoses    Vaginal itching    -  Primary   Relevant Orders   WET PREP FOR TRICH, YEAST, CLUE   Screen for STD (sexually transmitted disease)       Relevant Orders   C. trachomatis/N. gonorrhoeae RNA   Unprotected sexual intercourse       Relevant Orders   C. trachomatis/N. gonorrhoeae RNA     Plan: Reassurance provided on normal wet prep and exam.  GC/chlamydia pending and will triage results.  If symptoms worsen or do not improve she will return to office.  She is agreeable to plan.     11/27/19 [facility], 4:53 PM 05/02/2020

## 2020-05-03 ENCOUNTER — Other Ambulatory Visit: Payer: Self-pay | Admitting: Nurse Practitioner

## 2020-05-03 DIAGNOSIS — B9689 Other specified bacterial agents as the cause of diseases classified elsewhere: Secondary | ICD-10-CM

## 2020-05-03 LAB — C. TRACHOMATIS/N. GONORRHOEAE RNA
C. trachomatis RNA, TMA: NOT DETECTED
N. gonorrhoeae RNA, TMA: NOT DETECTED

## 2020-05-03 MED ORDER — METRONIDAZOLE 0.75 % VA GEL: 1.0000 | Freq: Two times a day (BID) | VAGINAL | 0 refills | Status: DC

## 2020-05-17 ENCOUNTER — Ambulatory Visit (INDEPENDENT_AMBULATORY_CARE_PROVIDER_SITE_OTHER): Payer: 59 | Admitting: Psychiatry

## 2020-05-17 ENCOUNTER — Other Ambulatory Visit: Payer: Self-pay

## 2020-05-17 DIAGNOSIS — F411 Generalized anxiety disorder: Secondary | ICD-10-CM

## 2020-05-17 NOTE — Progress Notes (Signed)
      [facility] Counselor/Therapist Progress Note  Patient ID: Helen Greene, MRN: [id number],    Date: 05/17/2020  Time Spent: 60 minutes   5:00pm to 6:00pm  Treatment Type: Individual Therapy  Reported Symptoms: anxiety, frustration, depression, tearful, sad, anger  Mental Status Exam:  Appearance:   Casual     Behavior:  Appropriate, Sharing and Motivated  Motor:  Normal  Speech/Language:   Clear and Coherent  Affect:  anxious, depressed  Mood:  anxious and depressed  Thought process:  normal  Thought content:    WNL  Sensory/Perceptual disturbances:    WNL  Orientation:  oriented to person, place, time/date, situation, day of week, month of year and year  Attention:  Good  Concentration:  Good and Fair  Memory:  WNL  Fund of knowledge:   Good  Insight:    Good  Judgment:   Good  Impulse Control:  Good   Risk Assessment: Danger to Self:  No Self-injurious Behavior: No Danger to Others: No Duty to Warn:no Physical Aggression / Violence:No  Access to Firearms a concern: No  Gang Involvement:No   Subjective:  Patient reports anxiety, depression, some tearfulness, and frustration. Patient reports she is one of 600 people whose job is ending Feb. 4, 2022. This just happened recently so patient is really struggling emotionally.   Interventions: Cognitive Behavioral Therapy and Solution-Oriented/Positive Psychology  Diagnosis:   ICD-10-CM   1. Generalized anxiety disorder  F41.1     Plan: Patient not signing tx plan on computer screen due to COVID.  Treatment Goals:Goals remain on tx plan as patient works on strategies  to meet her goals. Progress will be documented each session in "Progress" section on plan.   Long term goal: Develop the ability to recognize, accept, and cope with feelings ofanxiety anddepression.   Short term goal: Identify and replaceanxious anddepressive thinking that leads toanxious ordepressive feelings and actions.Replace  with more realistic and empowering thoughts.  Strategy: Reinforce patient's positive, reality based cognitive messages that enhance self-confidence and increase adaptive behaviors/action.  Progress: Patient today reports anxiety, frustration, tearfulness, and depression. Found out recently that her job is one of 600 jobs that is being terminated Feb. 4, 2022.  She is obviously very upset and concerned at session today. Processed a lot of her anxiety, fears, frustrations, depression, sadness, and anger about the job loss. Fears not having another job, fears her anxiety increasing a lot, and making assumptions that are all negative.  Encouraged her expression of shock, sadness, anger, and fear.  Also encouraged her to see her strengths, not to assume the worst, be checking out her options that she mentioned in session, let her faith be a support to her, use journaling as a tool, and anxiety reduction strategies reviewed in session.  Encouraged patient to try staying in the present and not jumping too far ahead, stay in touch with people who are supportive of her, let her self express her emotions and not try to hold them inside, continue to interrupt anxious thoughts as quickly as possible and replace with more encouraging and reality based thoughts, and make her self-talk more positive and  affirming.  Goal review an progress/challenges noted with patient.  Next appt within 1 month.   Mathis Fare, LCSW

## 2020-05-29 ENCOUNTER — Other Ambulatory Visit: Payer: Self-pay | Admitting: Physician Assistant

## 2020-06-14 ENCOUNTER — Other Ambulatory Visit: Payer: Self-pay

## 2020-06-14 ENCOUNTER — Ambulatory Visit (INDEPENDENT_AMBULATORY_CARE_PROVIDER_SITE_OTHER): Payer: 59 | Admitting: Psychiatry

## 2020-06-14 DIAGNOSIS — F411 Generalized anxiety disorder: Secondary | ICD-10-CM
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
## 2020-06-20 ENCOUNTER — Other Ambulatory Visit: Payer: Self-pay | Admitting: Emergency Medicine

## 2020-06-20 DIAGNOSIS — Z760 Encounter for issue of repeat prescription: Secondary | ICD-10-CM

## 2020-07-03 ENCOUNTER — Encounter: Payer: Self-pay | Admitting: Physician Assistant

## 2020-07-03 ENCOUNTER — Telehealth: Payer: Self-pay | Admitting: Physician Assistant

## 2020-07-03 ENCOUNTER — Telehealth (INDEPENDENT_AMBULATORY_CARE_PROVIDER_SITE_OTHER): Payer: 59 | Admitting: Physician Assistant

## 2020-07-03 DIAGNOSIS — F3342 Major depressive disorder, recurrent, in full remission: Secondary | ICD-10-CM

## 2020-07-03 DIAGNOSIS — G2581 Restless legs syndrome: Secondary | ICD-10-CM

## 2020-07-03 DIAGNOSIS — F411 Generalized anxiety disorder: Secondary | ICD-10-CM

## 2020-07-03 MED ORDER — BUPROPION HCL ER (XL) 300 MG PO TB24: 300.0000 mg | ORAL_TABLET | Freq: Every morning | ORAL | 3 refills | Status: DC

## 2020-07-03 MED ORDER — ESCITALOPRAM OXALATE 20 MG PO TABS: 20.0000 mg | ORAL_TABLET | Freq: Every day | ORAL | 3 refills | Status: DC

## 2020-07-03 NOTE — Progress Notes (Signed)
[facility] Med Check  Patient ID: Helen Greene,  MRN: [id number]  PCP: Doristine Bosworth, MD  Date of Evaluation: 07/03/2020 Time spent:30 minutes  Chief Complaint:  Chief Complaint    Anxiety; Depression; Follow-up     Virtual Visit via Telehealth  I connected with patient by a video enabled telemedicine application with their informed consent, and verified patient privacy and that I am speaking with the correct person using two identifiers.  I am private, in my home and the patient is at home.  I discussed the limitations, risks, security and privacy concerns of performing an evaluation and management service by video and the availability of in person appointments. I also discussed with the patient that there may be a patient responsible charge related to this service. The patient expressed understanding and agreed to proceed.   I discussed the assessment and treatment plan with the patient. The patient was provided an opportunity to ask questions and all were answered. The patient agreed with the plan and demonstrated an understanding of the instructions.   The patient was advised to call back or seek an in-person evaluation if the symptoms worsen or if the condition fails to improve as anticipated.  I provided 30 minutes of non-face-to-face time during this encounter.  HISTORY/CURRENT STATUS: HPI For routine med check.  Is doing really well. She is able to enjoy things.  Denies decreased energy or motivation.  Appetite and weight are stable.  No extreme sadness, tearfulness, or feelings of hopelessness. Is being laid off in June and even with that news, she hasn't been depressed or anxious. Hasn't taken an Ativan in a long time. She sleeps well.  No symptoms of restless leg syndrome.  Feels rested when she gets up.  She is still seeing Rockne Menghini, LCSW monthly and that continues to be very helpful.  Denies suicidal or homicidal thoughts.  Patient denies increased energy with  decreased need for sleep, no increased talkativeness, no racing thoughts, no impulsivity or risky behaviors, no increased spending, no increased libido, no grandiosity, no increased irritability or anger, and no hallucinations.  Denies dizziness, syncope, seizures, numbness, tingling, tremor, tics, unsteady gait, slurred speech, confusion. Denies muscle or joint pain, stiffness, or dystonia.  Individual Medical History/ Review of Systems: Changes? :No    Past medications for mental health diagnoses include: Wellbutrin XL, Risperdal caused edema, Lexapro, Mirapex, Vistaril, questionably Xanax, Abilify, Lamictal, Klonopin,pramipexole for restless legs   Allergies: Bee venom, Enablex [darifenacin hydrobromide], Erythromycin, Macrobid [nitrofurantoin monohyd macro], Metronidazole, Nitrofuran derivatives, and Risperidone and related  Current Medications:  Current Outpatient Medications:  .  ARIPiprazole (ABILIFY) 2 MG tablet, TAKE 1 TABLET EVERY MORNING, Disp: 90 tablet, Rfl: 1 .  atorvastatin (LIPITOR) 10 MG tablet, Take 1 tablet (10 mg total) by mouth every other day., Disp: 45 tablet, Rfl: 3 .  buPROPion (WELLBUTRIN XL) 300 MG 24 hr tablet, Take 1 tablet (300 mg total) by mouth every morning., Disp: 90 tablet, Rfl: 3 .  escitalopram (LEXAPRO) 20 MG tablet, Take 1 tablet (20 mg total) by mouth daily., Disp: 90 tablet, Rfl: 3 .  Ferrous Sulfate (IRON PO), Take by mouth. , Disp: , Rfl:  .  fluticasone (FLONASE) 50 MCG/ACT nasal spray, Place 2 sprays into both nostrils daily., Disp: 16 g, Rfl: 5 .  LORazepam (ATIVAN) 0.5 MG tablet, Take 1 tablet (0.5 mg total) by mouth every 8 (eight) hours as needed for anxiety., Disp: 90 tablet, Rfl: 1 .  metFORMIN (GLUCOPHAGE) 500 MG tablet,  Take 500 mg by mouth 2 (two) times daily., Disp: , Rfl:  .  metroNIDAZOLE (METROGEL) 0.75 % vaginal gel, Place 1 Applicatorful vaginally 2 (two) times daily., Disp: 70 g, Rfl: 0 .  omeprazole (PRILOSEC) 20 MG capsule, Take  1 capsule (20 mg total) by mouth daily., Disp: 90 capsule, Rfl: 3 .  ondansetron (ZOFRAN) 4 MG tablet, Take 1 tablet (4 mg total) by mouth every 8 (eight) hours as needed for nausea or vomiting., Disp: 20 tablet, Rfl: 0 .  oxyCODONE-acetaminophen (PERCOCET) 5-325 MG tablet, Take 1-2 tablets by mouth every 6 (six) hours as needed., Disp: 20 tablet, Rfl: 0 .  pramipexole (MIRAPEX) 0.25 MG tablet, TAKE 1 TABLET AT BEDTIME, Disp: 90 tablet, Rfl: 1 .  tamsulosin (FLOMAX) 0.4 MG CAPS capsule, Take 1 capsule (0.4 mg total) by mouth daily., Disp: 10 capsule, Rfl: 0 .  telmisartan-hydrochlorothiazide (MICARDIS HCT) 40-12.5 MG tablet, TAKE 1 TABLET DAILY, Disp: 30 tablet, Rfl: 0 .  valACYclovir (VALTREX) 1000 MG tablet, TAKE 2 TABLETS BY MOUTH EVERY 12 HOURS AS NEEDED FOR ONE DAY WITH OUTBREAK, Disp: 20 tablet, Rfl: 0 .  VITAMIN D PO, Take by mouth., Disp: , Rfl:  Medication Side Effects: none  Family Medical/ Social History: Changes?  Is being laid off in June  MENTAL HEALTH EXAM:  Last menstrual period 09/07/2015.There is no height or weight on file to calculate BMI.  General Appearance: Casual, Neat, Well Groomed and Obese  Eye Contact:  Good  Speech:  Clear and Coherent and Normal Rate  Volume:  Normal  Mood:  Euthymic  Affect:  Appropriate  Thought Process:  Goal Directed and Descriptions of Associations: Intact  Orientation:  Full (Time, Place, and Person)  Thought Content: Logical   Suicidal Thoughts:  No  Homicidal Thoughts:  No  Memory:  WNL  Judgement:  Good  Insight:  Good  Psychomotor Activity:  Normal  Concentration:  Concentration: Good and Attention Span: Good  Recall:  Good  Fund of Knowledge: Good  Language: Good  Assets:  Desire for Improvement  ADL's:  Intact  Cognition: WNL  Prognosis:  Good   Most recent pertinent labs: 03/19/2020 Glucose 117 09/27/2019 Total cholesterol 230, triglycerides 353, HDL 46, LDL 122  DIAGNOSES:    ICD-10-CM   1. Recurrent major  depressive disorder, in full remission (HCC)  F33.42   2. Generalized anxiety disorder  F41.1   3. Restless leg syndrome  G25.81     Receiving Psychotherapy: Yes With Rockne Menghini, LCSW.   RECOMMENDATIONS:  PDMP was reviewed. I provided 30 minutes of nonface-to-face time during this encounter in which we discussed her great response to medications and therapy, and reviewing labs since LOV.  I am glad to see her doing so well and no changes in medications will be made. Continue Abilify 2 mg every morning. Continue Wellbutrin XL 300 mg, 1 p.o. every morning. Continue Lexapro 20 mg daily. Continue Ativan 0.5 mg every 8 hours as needed anxiety. Continue pramipexole 0.25 mg nightly. Continue therapy with Rockne Menghini, LCSW. Return in 4 months.   Melony Overly, PA-C

## 2020-07-03 NOTE — Telephone Encounter (Signed)
Ms. Helen Greene, pointer are scheduled for a virtual visit with your provider today.    Just as we do with appointments in the office, we must obtain your consent to participate.  Your consent will be active for this visit and any virtual visit you may have with one of our providers in the next 365 days.    If you have a MyChart account, I can also send a copy of this consent to you electronically.  All virtual visits are billed to your insurance company just like a traditional visit in the office.  As this is a virtual visit, video technology does not allow for your provider to perform a traditional examination.  This may limit your provider's ability to fully assess your condition.  If your provider identifies any concerns that need to be evaluated in person or the need to arrange testing such as labs, EKG, etc, we will make arrangements to do so.    Although advances in technology are sophisticated, we cannot ensure that it will always work on either your end or our end.  If the connection with a video visit is poor, we may have to switch to a telephone visit.  With either a video or telephone visit, we are not always able to ensure that we have a secure connection.   I need to obtain your verbal consent now.   Are you willing to proceed with your visit today?   Helen Greene has provided verbal consent on 07/03/2020 for a virtual visit (video or telephone).   Melony Overly, PA-C 07/03/2020  4:48 PM

## 2020-07-11 ENCOUNTER — Other Ambulatory Visit: Payer: Self-pay

## 2020-07-11 ENCOUNTER — Ambulatory Visit (INDEPENDENT_AMBULATORY_CARE_PROVIDER_SITE_OTHER): Payer: 59 | Admitting: Psychiatry

## 2020-07-11 DIAGNOSIS — F411 Generalized anxiety disorder: Secondary | ICD-10-CM

## 2020-07-11 NOTE — Progress Notes (Signed)
[facility] Counselor/Therapist Progress Note  Patient ID: Helen Greene, MRN: [id number],    Date: 07/11/2020  Time Spent: 60 minutes    5:00pm to 6:00pm  Treatment Type: Individual Therapy  Reported Symptoms: Anxiety  Mental Status Exam:  Appearance:   Casual     Behavior:  Appropriate, Sharing and Motivated  Motor:  Normal  Speech/Language:   Clear and Coherent  Affect:  anxiety  Mood:  anxious  Thought process:  goal directed  Thought content:    WNL  Sensory/Perceptual disturbances:    WNL  Orientation:  oriented to person, place, time/date, situation, day of week, month of year and year  Attention:  Good  Concentration:  Fair  Memory:  WNL  Fund of knowledge:   Good  Insight:    Good  Judgment:   Good  Impulse Control:  Good   Risk Assessment: Danger to Self:  No Self-injurious Behavior: No Danger to Others: No Duty to Warn:no Physical Aggression / Violence:No  Access to Firearms a concern: No  Gang Involvement:No   Subjective: Patient today reporting main symptom of anxiety, especially in work situations.  "Yesterday for the first time in a while I had to take a lorazepam at work.".  "I don't take it unless my anxiety gets really bad."   Interventions: Solution-Oriented/Positive Psychology and Ego-Supportive  Diagnosis:   ICD-10-CM   1. Generalized anxiety disorder  F41.1      Plan: Patient not signing tx plan on computer screen due to COVID.  Treatment Goals:Goals remain on tx plan as patient works on strategies  to meet her goals. Progress will be documented each session in "Progress" section on plan.   Long term goal: Develop the ability to recognize, accept, and cope with feelings ofanxiety anddepression.   Short term goal: Identify and replaceanxious anddepressive thinking that leads toanxious ordepressive feelings and actions.Replace with more realistic and empowering thoughts.  Strategy: Reinforce patient's positive,  reality based cognitive messages that enhance self-confidence and increase adaptive behaviors/action.  Progress: Patient in today reporting anxiety mostly related to her job and stressors there. Extra stress there as the part of her company in which patient works is being phased out in about 4 months and she will be looking for different job closer to that time. Not quite as distressed at this time and has been thinking about her options and what may be best for her. Difficulties in relationships with others and often patient's lack of boundaries is a contributing factor. Had talked about this some last session and patient acknowledged that she's hesitant to set boundaries "because I don't like others being mad at me."  Processed this feeling more with patient who added that "I was taught not to say No to others."  Shares that she is now able to say No sometimes and it's not as hard as it used to be. Worked more towards end of session on some of her anxious thoughts that she feels are related to knowing her job is ending soon. Used her short-term goal and strategy in her tx plan above to change some of her anxious thoughts to be more reality-based and empowering for patient rather than feeding her anxiety, especially during these next few months as she ends a job and looks for another job. To continue working on this between sessions.  Strongly encourage patient to focus on her strengths, not assuming the worst case scenario, practicing self talk that is more positive and encouraging continue  working on anxiety reduction strategies as we discussed in session today, intentionally focus on and emphasize the positives, staying in the present and focus on what she can control, and stay in contact with others who are supportive of her.  Goal review and progress/challenges noted with patient.  Next appointment within 3 to 4 weeks.   Mathis Fare, LCSW

## 2020-07-14 ENCOUNTER — Other Ambulatory Visit: Payer: Self-pay | Admitting: Physician Assistant

## 2020-08-02 ENCOUNTER — Ambulatory Visit: Payer: 59 | Admitting: Psychiatry

## 2020-08-17 ENCOUNTER — Other Ambulatory Visit: Payer: Self-pay

## 2020-08-17 ENCOUNTER — Ambulatory Visit (INDEPENDENT_AMBULATORY_CARE_PROVIDER_SITE_OTHER): Payer: 59 | Admitting: Psychiatry

## 2020-08-17 DIAGNOSIS — F411 Generalized anxiety disorder: Secondary | ICD-10-CM | POA: Diagnosis not present

## 2020-08-17 NOTE — Progress Notes (Signed)
[facility] Counselor/Therapist Progress Note  Patient ID: Helen Greene, MRN: [id number],    Date: 08/17/2020  Time Spent: 60 minutes   5:05pm to 6:05pm  Treatment Type: Individual Therapy  Reported Symptoms: anxiety, "not much depression now"  Mental Status Exam:  Appearance:   Casual     Behavior:  Appropriate, Sharing and Motivated  Motor:  Normal  Speech/Language:   Clear and Coherent  Affect:  anxious  Mood:  anxious  Thought process:  goal directed  Thought content:    WNL  Sensory/Perceptual disturbances:    WNL  Orientation:  oriented to person, place, time/date, situation, day of week, month of year and year  Attention:  Good  Concentration:  Good and Fair  Memory:  WNL  Fund of knowledge:   Good  Insight:    Good and Fair  Judgment:   Good  Impulse Control:  Good   Risk Assessment: Danger to Self:  No Self-injurious Behavior: No Danger to Others: No Duty to Warn:no Physical Aggression / Violence:No  Access to Firearms a concern: No  Gang Involvement:No   Subjective: Patient in today reporting anxiety re: personal and some work stressor and also needing to locate a home.   Interventions: Cognitive Behavioral Therapy and Solution-Oriented/Positive Psychology  Diagnosis:   ICD-10-CM   1. Generalized anxiety disorder  F41.1      Plan: Patient not signing tx plan on computer screen due to COVID.  Treatment Goals:Goals remain on tx plan as patient works on strategies  to meet her goals. Progress will be documented each session in "Progress" section on plan.   Long term goal: Develop the ability to recognize, accept, and cope with feelings ofanxiety anddepression.   Short term goal: Identify and replaceanxious anddepressive thinking that leads toanxious ordepressive feelings and actions.Replace with more realistic and empowering thoughts.  Strategy: Reinforce patient's positive, reality based cognitive messages that enhance  self-confidence and increase adaptive behaviors/action.  Progress: Patient in today reporting anxiety as her main concern.  Anxiety is related mostly personal stressors, and some to her job stressors. Did get some good news regarding her job in that the expected cutback in positions to happen within a few months is not going to impact her; her job is not getting cut and she is relieved. Feeling more pressure about looking for a home that is safe and that she can afford; current living situation has some concerns.  Did pick back up on her lack of boundaries we discussed last session, and messages she got as she was growing up, that discouraged healthy boundaries and "learned from that not to make people mad". Gave several examples of situations where she really does need to use healthy boundaries. Seemed to understand boundaries better and able to see how they are not "a negative" and can actually preserve relationships at times.  She is to think more about this and actually try setting boundaries as appropriate between sessions and be able to share next session.  Continue to encourage patient on her strengths that she is showing especially in looking at some things differently than she has in the past and being able to get a sense of moving forward emotionally and in other ways.  Encouraged her not assuming worse case scenarios, being more positive and affirming in her self talk, focus on what she can control, staying in the present, practicing setting some appropriate boundaries as needed per our discussion today, using anxiety reduction strategies discussed in prior  session, and staying in contact with others who are supportive of her.   Goal review and progress/challenges noted with patient.  Next appointment within 3 to 4 weeks.   Mathis Fare, LCSW

## 2020-09-08 NOTE — Progress Notes (Addendum)
GYNECOLOGY  VISIT  CC:  Itching of vulva  HPI: 50 y.o. [id number] Divorced White or Caucasian female here for vaginal bump & itching.  Last week had sex and had pain and swelling and itching. Had sex with this person in the past and this did not happen. She has been with partner for "awhile" but not always sexually active. (They live together) GC/CT testing done 04/2020- WNL  She does not really notice a vaginal discharge, but is prone to bacterial vaginosis.  Also had a pimple in genital area and wants to make sure that is all it is.  Also noticed a hesitancy with urination. No new frequency or urgency with urination but has a little discomfort with initiation of stream.   Pt reports did not take blood pressure medication this am.  GYNECOLOGIC HISTORY: Patient's last menstrual period was 09/07/2015 (exact date). Contraception: hysterectomy Menopausal hormone therapy: none  Patient Active Problem List   Diagnosis Date Noted  . Seasonal allergic rhinitis 10/09/2018  . Major depressive disorder, recurrent episode, moderate (HCC) 04/01/2018  . Hypertension 08/15/2017  . Morbid obesity with BMI of 40.0-44.9, adult (HCC) 08/15/2017  . S/P tonsillectomy 08/11/2017  . S/P total hysterectomy 09/14/2015  . RLS (restless legs syndrome) 03/17/2015  . Essential hypertension 05/19/2013    Past Medical History:  Diagnosis Date  . Allergy   . Anxiety   . Depression   . Elevated white blood cell count   . Elevated white blood cell count 09/25/2011  . GERD (gastroesophageal reflux disease)   . Headache    Migraine  . Hypertension   . Kidney stone   . Neuromuscular disorder (HCC)    carpel tunnel disease  . Pneumonia 08/15/2017  . Restless leg syndrome   . Shortness of breath dyspnea    with exertion  . Sleep apnea    does not have CPAP machine, mostly affects back sleeping was encouraged to sleep on her sides  . Tonsillar calculus     Past Surgical History:  Procedure Laterality  Date  . COLONOSCOPY    . DILATION AND CURETTAGE OF UTERUS  1993  . ECTOPIC PREGNANCY SURGERY    . ESSURE TUBAL LIGATION    . LAPAROSCOPIC CHOLECYSTECTOMY W/ CHOLANGIOGRAPHY  02/25/2001   Dr Lindie Spruce  . LITHOTRIPSY  12/22/2014  . ROBOTIC ASSISTED TOTAL HYSTERECTOMY WITH SALPINGECTOMY Left 09/14/2015   Procedure: ROBOTIC ASSISTED TOTAL HYSTERECTOMY WITH LEFT SALPINGECTOMY;  Surgeon: Noland Fordyce, MD;  Location: [facility];  Service: Gynecology;  Laterality: Left;  . TONSILLECTOMY N/A 08/11/2017   Procedure: TONSILLECTOMY;  Surgeon: Serena Colonel, MD;  Location: [facility];  Service: ENT;  Laterality: N/A;  . UPPER GI ENDOSCOPY    . WISDOM TOOTH EXTRACTION      MEDS:   Current Outpatient Medications on File Prior to Visit  Medication Sig Dispense Refill  . ARIPiprazole (ABILIFY) 2 MG tablet TAKE 1 TABLET EVERY MORNING 90 tablet 1  . atorvastatin (LIPITOR) 10 MG tablet Take 1 tablet (10 mg total) by mouth every other day. 45 tablet 3  . buPROPion (WELLBUTRIN XL) 300 MG 24 hr tablet Take 1 tablet (300 mg total) by mouth every morning. 90 tablet 3  . escitalopram (LEXAPRO) 20 MG tablet Take 1 tablet (20 mg total) by mouth daily. 90 tablet 3  . Ferrous Sulfate (IRON PO) Take by mouth.     Marland Kitchen LORazepam (ATIVAN) 0.5 MG tablet Take 1 tablet (0.5 mg total) by mouth every 8 (eight) hours as needed  for anxiety. 90 tablet 1  . metFORMIN (GLUCOPHAGE) 500 MG tablet Take 500 mg by mouth 2 (two) times daily.    Marland Kitchen omeprazole (PRILOSEC) 20 MG capsule Take 1 capsule (20 mg total) by mouth daily. 90 capsule 3  . pramipexole (MIRAPEX) 0.25 MG tablet TAKE 1 TABLET AT BEDTIME 90 tablet 1  . tamsulosin (FLOMAX) 0.4 MG CAPS capsule Take 1 capsule (0.4 mg total) by mouth daily. 10 capsule 0  . telmisartan-hydrochlorothiazide (MICARDIS HCT) 40-12.5 MG tablet TAKE 1 TABLET DAILY 30 tablet 0  . valACYclovir (VALTREX) 1000 MG tablet TAKE 2 TABLETS BY MOUTH EVERY 12 HOURS AS NEEDED FOR ONE DAY WITH OUTBREAK 20  tablet 0   No current facility-administered medications on file prior to visit.    ALLERGIES: Bee venom, Enablex [darifenacin hydrobromide], Erythromycin, Macrobid [nitrofurantoin monohyd macro], Metronidazole, Nitrofuran derivatives, and Risperidone and related  Family History  Problem Relation Age of Onset  . Hypertension Mother   . Heart failure Mother   . Cancer Mother   . Heart disease Mother   . Heart disease Brother   . Hypertension Brother   . Cancer Father        Leukemia  . Hypertension Father   . Cancer Sister   . Breast cancer Sister   . Diabetes Other   . Colon cancer Neg Hx   . Esophageal cancer Neg Hx   . Rectal cancer Neg Hx   . Stomach cancer Neg Hx      Review of Systems  Constitutional: Negative.   HENT: Negative.   Eyes: Negative.   Respiratory: Negative.   Cardiovascular: Negative.   Gastrointestinal: Negative.   Endocrine: Negative.   Genitourinary:       Vaginal bump, vaginal itching  Musculoskeletal: Negative.   Skin: Negative.   Allergic/Immunologic: Negative.   Neurological: Negative.   Hematological: Negative.   Psychiatric/Behavioral: Negative.     PHYSICAL EXAMINATION:    BP 126/90   Pulse 70   Resp 16   Wt 218 lb (98.9 kg)   LMP 09/07/2015 (Exact Date)   BMI 44.03 kg/m     General appearance: alert, cooperative, no acute distress  Lymph:  no inguinal LAD noted  Pelvic: External genitalia: small ulceration of inner labia majora              Urethra:  normal appearing urethra with no masses, tenderness or lesions              Bartholins and Skenes: normal                 Vagina: normal appearing vagina, white discharge, ph 4.5              Cervix: absent              Bimanual Exam:  Uterus:  uterus absent              Adnexa: no mass, fullness, tenderness               Chaperone, Joy, CMA, was present for exam.  Assessment/Plan:  Vaginal irritation - Plan: WET PREP FOR TRICH, YEAST, CLUE, lidocaine (XYLOCAINE) 5 %  ointment  Vulvar lesion - Plan: SureSwab HSV, Type 1/2 DNA, PCR  BV (bacterial vaginosis) - Plan: metroNIDAZOLE (METROGEL) 0.75 % vaginal gel  F/U if no improvement  Next annual exam due 02/2021  Note: additional code added L98.9 for insurance coverage for HSV culture. KD

## 2020-09-11 ENCOUNTER — Other Ambulatory Visit: Payer: Self-pay

## 2020-09-11 ENCOUNTER — Ambulatory Visit (INDEPENDENT_AMBULATORY_CARE_PROVIDER_SITE_OTHER): Payer: Managed Care, Other (non HMO) | Admitting: Nurse Practitioner

## 2020-09-11 ENCOUNTER — Encounter: Payer: Self-pay | Admitting: Nurse Practitioner

## 2020-09-11 VITALS — BP 126/90 | HR 70 | Resp 16 | Wt 218.0 lb

## 2020-09-11 DIAGNOSIS — N9089 Other specified noninflammatory disorders of vulva and perineum: Secondary | ICD-10-CM

## 2020-09-11 DIAGNOSIS — L989 Disorder of the skin and subcutaneous tissue, unspecified: Secondary | ICD-10-CM

## 2020-09-11 DIAGNOSIS — B9689 Other specified bacterial agents as the cause of diseases classified elsewhere: Secondary | ICD-10-CM

## 2020-09-11 DIAGNOSIS — N898 Other specified noninflammatory disorders of vagina: Secondary | ICD-10-CM | POA: Diagnosis not present

## 2020-09-11 DIAGNOSIS — N760 Acute vaginitis: Secondary | ICD-10-CM

## 2020-09-11 LAB — WET PREP FOR TRICH, YEAST, CLUE

## 2020-09-11 MED ORDER — LIDOCAINE 5 % EX OINT: 1.0000 "application " | TOPICAL_OINTMENT | Freq: Three times a day (TID) | CUTANEOUS | 0 refills | Status: DC

## 2020-09-11 MED ORDER — METRONIDAZOLE 0.75 % VA GEL: VAGINAL | 1 refills | Status: DC

## 2020-09-11 NOTE — Patient Instructions (Signed)
Bacterial Vaginosis  Bacterial vaginosis is an infection of the vagina. It happens when too many normal germs (healthy bacteria) grow in the vagina. This infection can make it easier to get other infections from sex (STIs). It is very important for pregnant women to get treated. This infection can cause babies to be born early or at a low birth weight. What are the causes? This infection is caused by an increase in certain germs that grow in the vagina. You cannot get this infection from toilet seats, bedsheets, swimming pools, or things that touch your vagina. What increases the risk?  Having sex with a new person or more than one person.  Having sex without protection.  Douching.  Having an intrauterine device (IUD).  Smoking.  Using drugs or drinking alcohol. These can lead you to do things that are risky.  Taking certain antibiotic medicines.  Being pregnant. What are the signs or symptoms? Some women have no symptoms. Symptoms may include:  A discharge from your vagina. It may be gray or white. It can be watery or foamy.  A fishy smell. This can happen after sex or during your menstrual period.  Itching in and around your vagina.  A feeling of burning or pain when you pee (urinate). How is this treated? This infection is treated with antibiotic medicines. These may be given to you as:  A pill.  A cream for your vagina.  A medicine that you put into your vagina (suppository). If the infection comes back after treatment, you may need more antibiotics. Follow these instructions at home: Medicines  Take over-the-counter and prescription medicines as told by your doctor.  Take or use your antibiotic medicine as told by your doctor. Do not stop taking or using it, even if you start to feel better. General instructions  If the person you have sex with is a woman, tell her that you have this infection. She will need to follow up with her doctor. If you have a female  partner, he does not need to be treated.  Do not have sex until you finish treatment.  Drink enough fluid to keep your pee pale yellow.  Keep your vagina and butt clean. ? Wash the area with warm water each day. ? Wipe from front to back after you use the toilet.  If you are breastfeeding a baby, ask your doctor if you should keep doing so during treatment.  Keep all follow-up visits. How is this prevented? Self-care  Do not douche.  Use only warm water to wash around your vagina.  Wear underwear that is cotton or lined with cotton.  Do not wear tight pants and pantyhose, especially in the summer. Safe sex  Use protection when you have sex. This includes: ? Use condoms. ? Use dental dams. This is a thin layer that protects the mouth during oral sex.  Limit how many people you have sex with. To prevent this infection, it is best to have sex with just one person.  Get tested for STIs. The person you have sex with should also get tested. Drugs and alcohol  Do not smoke or use any products that contain nicotine or tobacco. If you need help quitting, ask your doctor.  Do not use drugs.  Do not drink alcohol if: ? Your doctor tells you not to drink. ? You are pregnant, may be pregnant, or are planning to become pregnant.  If you drink alcohol: ? Limit how much you have to 0-1 drink   a day. ? Know how much alcohol is in your drink. In the [place] one drink equals one 12 oz bottle of beer (355 mL), one 5 oz glass of wine (148 mL), or one 1 oz glass of hard liquor (44 mL). Where to find more information  Centers for Disease Control and Prevention: www.cdc.gov  American Sexual Health Association: www.ashastd.org  Office on Women's Health: www.womenshealth.gov Contact a doctor if:  Your symptoms do not get better, even after you are treated.  You have more discharge or pain when you pee.  You have a fever or chills.  You have pain in your belly (abdomen) or in the area  between your hips.  You have pain with sex.  You bleed from your vagina between menstrual periods. Summary  This infection can happen when too many germs (bacteria) grow in the vagina.  This infection can make it easier to get infections from sex (STIs). Treating this can lower that chance.  Get treated if you are pregnant. This infection can cause babies to be born early.  Do not stop taking or using your antibiotic medicine, even if you start to feel better. This information is not intended to replace advice given to you by your health care provider. Make sure you discuss any questions you have with your health care provider. Document Revised: 12/02/2019 Document Reviewed: 12/02/2019 Elsevier Patient Education  2021 Elsevier Inc.  

## 2020-09-13 ENCOUNTER — Other Ambulatory Visit: Payer: Self-pay | Admitting: Nurse Practitioner

## 2020-09-13 DIAGNOSIS — B009 Herpesviral infection, unspecified: Secondary | ICD-10-CM

## 2020-09-13 LAB — SURESWAB HSV, TYPE 1/2 DNA, PCR
HSV 1 DNA: NOT DETECTED
HSV 2 DNA: DETECTED — AB

## 2020-09-13 MED ORDER — VALACYCLOVIR HCL 500 MG PO TABS: 500.0000 mg | ORAL_TABLET | Freq: Two times a day (BID) | ORAL | 5 refills | Status: DC

## 2020-09-13 NOTE — Progress Notes (Signed)
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

## 2020-09-14 ENCOUNTER — Other Ambulatory Visit: Payer: Self-pay

## 2020-09-14 ENCOUNTER — Encounter: Payer: Self-pay | Admitting: Nurse Practitioner

## 2020-09-14 ENCOUNTER — Ambulatory Visit (INDEPENDENT_AMBULATORY_CARE_PROVIDER_SITE_OTHER): Payer: Managed Care, Other (non HMO) | Admitting: Nurse Practitioner

## 2020-09-14 VITALS — BP 118/80 | HR 78 | Resp 16 | Wt 218.0 lb

## 2020-09-14 DIAGNOSIS — R300 Dysuria: Secondary | ICD-10-CM | POA: Diagnosis not present

## 2020-09-14 DIAGNOSIS — B009 Herpesviral infection, unspecified: Secondary | ICD-10-CM | POA: Diagnosis not present

## 2020-09-14 NOTE — Progress Notes (Signed)
GYNECOLOGY  VISIT  CC:   Lab results positive for HSV 2  HPI: 50 y.o. [id number] Divorced White or Caucasian female here for consult to discuss lab result.  GYNECOLOGIC HISTORY: Patient's last menstrual period was 09/07/2015 (exact date). Contraception: hysterectomy Menopausal hormone therapy: none  Patient Active Problem List   Diagnosis Date Noted  . Seasonal allergic rhinitis 10/09/2018  . Major depressive disorder, recurrent episode, moderate (HCC) 04/01/2018  . Hypertension 08/15/2017  . Morbid obesity with BMI of 40.0-44.9, adult (HCC) 08/15/2017  . S/P tonsillectomy 08/11/2017  . S/P total hysterectomy 09/14/2015  . RLS (restless legs syndrome) 03/17/2015  . Essential hypertension 05/19/2013    Past Medical History:  Diagnosis Date  . Allergy   . Anxiety   . Depression   . Elevated white blood cell count   . Elevated white blood cell count 09/25/2011  . GERD (gastroesophageal reflux disease)   . Headache    Migraine  . Hypertension   . Kidney stone   . Neuromuscular disorder (HCC)    carpel tunnel disease  . Pneumonia 08/15/2017  . Restless leg syndrome   . Shortness of breath dyspnea    with exertion  . Sleep apnea    does not have CPAP machine, mostly affects back sleeping was encouraged to sleep on her sides  . Tonsillar calculus     Past Surgical History:  Procedure Laterality Date  . COLONOSCOPY    . DILATION AND CURETTAGE OF UTERUS  1993  . ECTOPIC PREGNANCY SURGERY    . ESSURE TUBAL LIGATION    . LAPAROSCOPIC CHOLECYSTECTOMY W/ CHOLANGIOGRAPHY  02/25/2001   Dr Lindie Spruce  . LITHOTRIPSY  12/22/2014  . ROBOTIC ASSISTED TOTAL HYSTERECTOMY WITH SALPINGECTOMY Left 09/14/2015   Procedure: ROBOTIC ASSISTED TOTAL HYSTERECTOMY WITH LEFT SALPINGECTOMY;  Surgeon: Noland Fordyce, MD;  Location: [facility];  Service: Gynecology;  Laterality: Left;  . TONSILLECTOMY N/A 08/11/2017   Procedure: TONSILLECTOMY;  Surgeon: Serena Colonel, MD;  Location: [facility];   Service: ENT;  Laterality: N/A;  . UPPER GI ENDOSCOPY    . WISDOM TOOTH EXTRACTION      MEDS:   Current Outpatient Medications on File Prior to Visit  Medication Sig Dispense Refill  . ARIPiprazole (ABILIFY) 2 MG tablet TAKE 1 TABLET EVERY MORNING 90 tablet 1  . atorvastatin (LIPITOR) 10 MG tablet Take 1 tablet (10 mg total) by mouth every other day. 45 tablet 3  . buPROPion (WELLBUTRIN XL) 300 MG 24 hr tablet Take 1 tablet (300 mg total) by mouth every morning. 90 tablet 3  . escitalopram (LEXAPRO) 20 MG tablet Take 1 tablet (20 mg total) by mouth daily. 90 tablet 3  . Ferrous Sulfate (IRON PO) Take by mouth.     . lidocaine (XYLOCAINE) 5 % ointment Apply 1 application topically 3 (three) times daily. 1.25 g 0  . LORazepam (ATIVAN) 0.5 MG tablet Take 1 tablet (0.5 mg total) by mouth every 8 (eight) hours as needed for anxiety. 90 tablet 1  . metFORMIN (GLUCOPHAGE) 500 MG tablet Take 500 mg by mouth 2 (two) times daily.    . metroNIDAZOLE (METROGEL) 0.75 % vaginal gel Use 1 applicator full vaginally hs x 5 nights 70 g 1  . omeprazole (PRILOSEC) 20 MG capsule Take 1 capsule (20 mg total) by mouth daily. 90 capsule 3  . pramipexole (MIRAPEX) 0.25 MG tablet TAKE 1 TABLET AT BEDTIME 90 tablet 1  . tamsulosin (FLOMAX) 0.4 MG CAPS capsule Take 1 capsule (0.4 mg  total) by mouth daily. 10 capsule 0  . telmisartan-hydrochlorothiazide (MICARDIS HCT) 40-12.5 MG tablet TAKE 1 TABLET DAILY 30 tablet 0  . valACYclovir (VALTREX) 1000 MG tablet TAKE 2 TABLETS BY MOUTH EVERY 12 HOURS AS NEEDED FOR ONE DAY WITH OUTBREAK 20 tablet 0  . valACYclovir (VALTREX) 500 MG tablet Take 1 tablet (500 mg total) by mouth 2 (two) times daily. For 3-5 days during outbreak 30 tablet 5   No current facility-administered medications on file prior to visit.    ALLERGIES: Bee venom, Enablex [darifenacin hydrobromide], Erythromycin, Macrobid [nitrofurantoin monohyd macro], Metronidazole, Nitrofuran derivatives, and Risperidone  and related  Family History  Problem Relation Age of Onset  . Hypertension Mother   . Heart failure Mother   . Cancer Mother   . Heart disease Mother   . Heart disease Brother   . Hypertension Brother   . Cancer Father        Leukemia  . Hypertension Father   . Cancer Sister   . Breast cancer Sister   . Diabetes Other   . Colon cancer Neg Hx   . Esophageal cancer Neg Hx   . Rectal cancer Neg Hx   . Stomach cancer Neg Hx      Review of Systems  PHYSICAL EXAMINATION:    LMP 09/07/2015 (Exact Date)     General appearance: alert, cooperative, no acute distress  Pelvic: External genitalia:  HSV lesion appears to be minimally larger              Urethra:  normal appearing urethra with no masses, tenderness or lesions              Bartholins and Skenes: normal                  Assessment/Plan: Burning with urination - Plan: Urinalysis,Complete w/RFL Culture- advised likely still from HSV outbreak  Discussed HSV in detail. Discussed treatment options. Pt already has Rx for Lidocaine for pain relief.  HSV-2 infection- pt desies suppressive therapy. Discussed. Medication previously sent in. Will suppress x 6 months then re-evaluate.  20 minutes of total time was spent for this patient encounter, including preparation, face-to-face counseling with the patient and coordination of care, and documentation of the encounter.

## 2020-09-14 NOTE — Patient Instructions (Signed)
[email or web address]">  Genital Herpes Genital herpes is a common sexually transmitted infection (STI) that is caused by a virus. The virus spreads from person to person through sexual contact. The infection can cause itching, blisters, and sores around the genitals or rectum. Symptoms may last for several days and then go away. This is called an outbreak. The virus remains in the body, however, so more outbreaks may happen in the future. The time between outbreaks varies and can be from months to years. Genital herpes can affect anyone. It is particularly concerning for pregnant women because the virus can be passed to the baby during delivery and can cause serious problems. Genital herpes is also a concern for people who have a weak disease-fighting system (immune system). What are the causes? This condition is caused by the human herpesvirus, also called herpes simplex virus, type 1 or type 2 (HSV-1 or HSV-2). The virus may spread through:  Sexual contact with an infected person, including vaginal, anal, and oral sex.  Contact with fluid from a herpes sore.  The skin. This means that you can get herpes from an infected partner even if there are no blisters or sores present. Your partner may not know that he or she is infected. What increases the risk? You are more likely to develop this condition if:  You have sex with many partners.  You do not use latex condoms during sex. What are the signs or symptoms? Most people do not have symptoms (are asymptomatic), or they have mild symptoms that may be mistaken for other skin problems. Symptoms may include:  Small, red bumps near the genitals, rectum, or mouth. These bumps turn into blisters and then sores.  Flu-like symptoms, including: ? Fever. ? Body aches. ? Swollen lymph nodes. ? Headache.  Painful urination.  Pain and itching in the genital area or rectal  area.  Vaginal discharge.  Tingling or shooting pain in the legs and buttocks. Generally, symptoms are more severe and last longer during the first (primary) outbreak. Flu-like symptoms are also more common during the primary outbreak.   How is this diagnosed? This condition may be diagnosed based on:  A physical exam.  Your medical history.  Blood tests.  A test of a fluid sample (culture) from an open sore. How is this treated? There is no cure for this condition, but treatment with antiviral medicines that are taken by mouth (orally) can do the following:  Speed up healing and relieve symptoms.  Help to reduce the spread of the virus to sexual partners.  Limit the chance of future outbreaks, or make future outbreaks shorter.  Lessen symptoms of future outbreaks. Your health care provider may also recommend pain relief medicines, such as aspirin or ibuprofen. Follow these instructions at home: If you have an outbreak:  Keep the affected areas dry and clean.  Avoid rubbing or touching blisters and sores. If you do touch blisters or sores: ? Wash your hands thoroughly with soap and water. ? Do not touch your eyes afterward.  To help relieve pain or itching, you may take the following actions as told by your health care provider: ? Apply a cold, wet cloth (cold compress) to affected areas 4-6 times a day. ? Apply a substance that protects your skin and reduces bleeding (astringent). ? Apply a gel that helps relieve pain around sores (lidocaine gel). ? Take a warm, shallow bath that cleans the genital area (sitz bath). Sexual activity  Do not have sexual contact during   active outbreaks.  Practice safe sex. Herpes can spread even if your partner does not have blisters or sores. Latex condoms and female condoms may help prevent the spread of the herpes virus. General instructions  Take over-the-counter and prescription medicines only as told by your health care  provider.  Keep all follow-up visits as told by your health care provider. This is important. How is this prevented?  Use condoms. Although you can get genital herpes during sexual contact even with the use of a condom, a condom can provide some protection.  Avoid having multiple sexual partners.  Talk with your sexual partner about any symptoms either of you may have. Also, talk with your partner about any history of STIs.  Get tested for STIs before you have sex. Ask your partner to do the same.  Do not have sexual contact if you have active symptoms of genital herpes. Contact a health care provider if:  Your symptoms are not improving with medicine.  Your symptoms return, or you have new symptoms.  You have a fever.  You have abdominal pain.  You have redness, swelling, or pain in your eye.  You notice new sores on other parts of your body.  You are a woman and you experience bleeding between menstrual periods.  You have had herpes and you become pregnant or plan to become pregnant. Summary  Genital herpes is a common sexually transmitted infection (STI) that is caused by the herpes simplex virus, type 1 or type 2 (HSV-1 or HSV-2).  These viruses are most often spread through sexual contact with an infected person.  You are more likely to develop this condition if you have sex with many partners or you do not use condoms during sex.  Most people do not have symptoms (are asymptomatic) or have mild symptoms that may be mistaken for other skin problems. Symptoms occur as outbreaks that may happen months or years apart.  There is no cure for this condition, but treatment with oral antiviral medicines can reduce symptoms, reduce the chance of spreading the virus to a partner, prevent future outbreaks, or shorten future outbreaks. This information is not intended to replace advice given to you by your health care provider. Make sure you discuss any questions you have with your  health care provider. Document Revised: 02/11/2019 Document Reviewed: 02/11/2019 Elsevier Patient Education  2021 [employer]  

## 2020-09-15 ENCOUNTER — Encounter: Payer: Self-pay | Admitting: Nurse Practitioner

## 2020-09-17 LAB — URINALYSIS, COMPLETE W/RFL CULTURE
Bilirubin Urine: NEGATIVE
Glucose, UA: NEGATIVE
Hyaline Cast: NONE SEEN /LPF
Ketones, ur: NEGATIVE
Nitrites, Initial: NEGATIVE
Protein, ur: NEGATIVE
RBC / HPF: NONE SEEN /HPF (ref 0–2)
Specific Gravity, Urine: 1.025 (ref 1.001–1.03)
pH: 6.5 (ref 5.0–8.0)

## 2020-09-17 LAB — URINE CULTURE
MICRO NUMBER:: 11716226
SPECIMEN QUALITY:: ADEQUATE

## 2020-09-17 LAB — CULTURE INDICATED

## 2020-09-18 ENCOUNTER — Other Ambulatory Visit: Payer: Self-pay | Admitting: Nurse Practitioner

## 2020-09-18 MED ORDER — FLUCONAZOLE 150 MG PO TABS: 150.0000 mg | ORAL_TABLET | Freq: Once | ORAL | 0 refills | Status: AC

## 2020-09-18 MED ORDER — AMOXICILLIN 875 MG PO TABS: 875.0000 mg | ORAL_TABLET | Freq: Two times a day (BID) | ORAL | 0 refills | Status: AC

## 2020-09-18 NOTE — Progress Notes (Signed)
Urine culture indicates bladder infection. Pt allergic to macrobid. Rx sent for Amoxicillin. Called patient to notify. Pt also desires Diflucan. Advised only take diflucan if becomes symptomatic. KD

## 2020-09-19 ENCOUNTER — Other Ambulatory Visit: Payer: Self-pay

## 2020-09-19 ENCOUNTER — Ambulatory Visit (INDEPENDENT_AMBULATORY_CARE_PROVIDER_SITE_OTHER): Payer: 59 | Admitting: Psychiatry

## 2020-09-19 DIAGNOSIS — F411 Generalized anxiety disorder: Secondary | ICD-10-CM

## 2020-09-19 NOTE — Progress Notes (Signed)
[facility] Counselor/Therapist Progress Note  Patient ID: Helen Greene, MRN: [id number],    Date: 09/19/2020  Time Spent: 60 minutes   12:00noon to 1:00pm  Treatment Type: Individual Therapy  Reported Symptoms:  Anxiety "and I sometimes talk faster when anxious and get ahead of myself."  "I say too much sometimes.'   Mental Status Exam:  Appearance:   Casual     Behavior:  Appropriate, Sharing and Motivated  Motor:  Normal  Speech/Language:   Clear and Coherent  Affect:  anxious, some sadness   Mood:  anxious and some sadness re: dog  Thought process:  "sometimes I get ahead of myself when anxious"  Thought content:    some obsessiveness  Sensory/Perceptual disturbances:    WNL  Orientation:  oriented to person, place, time/date, situation, day of week, month of year and year  Attention:  Good  Concentration:  Good and Fair  Memory:  WNL  Fund of knowledge:   Good  Insight:    Good and Fair  Judgment:   Good  Impulse Control:  Fair   Risk Assessment: Danger to Self:  No Self-injurious Behavior: No Danger to Others: No Duty to Warn:no Physical Aggression / Violence:No  Access to Firearms a concern: No  Gang Involvement:No   Subjective: Patient today reporting anxiety as main symptom recently, "but do handle some things better."  "Still working on talking less when I'm overly anxious because I sometimes over-talk and say too much".    Interventions: Solution-Oriented/Positive Psychology and Ego-Supportive  Diagnosis:   ICD-10-CM   1. Generalized anxiety disorder  F41.1      Plan: Patient not signing tx plan on computer screen due to COVID.  Treatment Goals:Goals remain on tx plan as patient works on strategies  to meet her goals. Progress will be documented each session in "Progress" section on plan.   Long term goal: Develop the ability to recognize, accept, and cope with feelings ofanxiety anddepression.   Short term goal: Identify and  replaceanxious anddepressive thinking that leads toanxious ordepressive feelings and actions.Replace with more realistic and empowering thoughts.  Strategy: Reinforce patient's positive, reality based cognitive messages that enhance self-confidence and increase adaptive behaviors/action.  Progress: Patient in today reporting daily anxiety as main symptom recently, regarding personal stressors and work issues "but mostly personal". "I do handle some things better".  "Still working on talking less when I'm overly anxious because I sometimes over-talk and say too much or regret impulsively saying some things."  "I notice these things don't happen when I'm not anxious."  Saw half-sister and half-brother (in their 64's) recently after family funeral, and wants to have a closer relationship with them.  Had not seen them in a really long time, and processed her feelings more today about that part of her family and changes that have occurred over time.  Talk to some ways that she might reach out to them in hopes of having a close relationship and patient seems very motivated for this, but also does not want to try and force anything with them.  Is hoping that the feelings will be mutual between her and them.  Also processed some feelings about a close friend of hers who has cancer that is progressing.  Shares her grief as well as some mixed feelings about their relationship over a few years.  Used her short-term goal and strategy and treatment plan above to work on some of her anxious/depressive thoughts in reference to her  grief with his friend and some issues within her own personal family including adult kids.  Patient is encouraged to continue working on these issues between sessions.  Emphasized her strengths with her as she is working harder to maintain some gains and deal with some more recent specific stressors as noted above.  Encouraged her to have healthy boundaries with others, being able to say  "no" when needed, to practice more positive self-care and self talk, focus on what she can control, stay in the present versus the past or future, use anxiety reduction strategies as discussed in prior sessions, stay in contact with people who are supportive of her, set limits with people who are not healthy for her, and feel good about the growth she has made and the strength that she is currently showing to confront situations that are difficult, as she tries to move forward in a more positive direction.  Goal review and progress/challenges noted with patient.  Next appointment within 1 month.   Mathis Fare, LCSW

## 2020-10-11 ENCOUNTER — Ambulatory Visit (INDEPENDENT_AMBULATORY_CARE_PROVIDER_SITE_OTHER): Payer: 59 | Admitting: Psychiatry

## 2020-10-11 ENCOUNTER — Other Ambulatory Visit: Payer: Self-pay

## 2020-10-11 DIAGNOSIS — F411 Generalized anxiety disorder: Secondary | ICD-10-CM | POA: Diagnosis not present

## 2020-10-11 NOTE — Progress Notes (Signed)
[facility] Counselor/Therapist Progress Note  Patient ID: Helen Greene, MRN: [id number],    Date: 10/11/2020  Time Spent: 60 minutes   9:00am to 10:00am   Treatment Type: Individual Therapy  Reported Symptoms: anxiety (personal and work), frustration  Mental Status Exam:  Appearance:   Casual     Behavior:  Appropriate, Sharing and Motivated  Motor:  Normal  Speech/Language:   Clear and Coherent  Affect:  anxious, frustration  Mood:  anxious  Thought process:  goal directed  Thought content:    WNL  Sensory/Perceptual disturbances:    WNL  Orientation:  oriented to person, place, time/date, situation, day of week, month of year and year  Attention:  Good  Concentration:  Good  Memory:  WNL  Fund of knowledge:   Good  Insight:    Good and Fair  Judgment:   Good  Impulse Control:  Good and Fair   Risk Assessment: Danger to Self:  No Self-injurious Behavior: No Danger to Others: No Duty to Warn:no Physical Aggression / Violence:No  Access to Firearms a concern: No  Gang Involvement:No   Subjective: Patient today reports anxiety and frustration, related to personal/friends/work; family situations.  See Progress note below.  Interventions: Solution-Oriented/Positive Psychology and Ego-Supportive  Diagnosis:   ICD-10-CM   1. Generalized anxiety disorder  F41.1      Plan: Patient not signing tx plan on computer screen due to COVID.  Treatment Goals:Goals remain on tx plan as patient works on strategies  to meet her goals. Progress will be documented each session in "Progress" section on plan.   Long term goal: Develop the ability to recognize, accept, and cope with feelings ofanxiety anddepression.   Short term goal: Identify and replaceanxious anddepressive thinking that leads toanxious ordepressive feelings and actions.Replace with more realistic and empowering thoughts.  Strategy: Reinforce patient's positive, reality based cognitive  messages that enhance self-confidence and increase adaptive behaviors/action.  Progress: Patient in today reporting daily anxiety and frustration, related to personal/family/work/friends, situations. Very frustrated with "room-mate" and feeling"irritated and that I need to set some limits and starting to realize she is feeling used and needs to stop allowing person to live with her rent-free." Anxious/depressive thoughts" are not as bad as they were but still bother me and I get more depressed when the thoughts worsen." Worked with her short term goal and strategy in tx plan above to help patient intercept the anxious/depressive thoughts and replace with more reality-based thoughts that do not support patient's anxiety.  She is to continue working on this as well as better limit setting with other people as discussed in session today.  Patient does seem to be feeling stronger in some ways and continues to work hard to maintain her gains and manage stressors more effectively.  Encouraged patient to have more positive self-care and self talk, to say "no" when needed, to use anxiety reduction strategies as discussed today and in prior sessions, to remain in contact with people who are supportive of her, staying in the present and focus on what she can control, set limits with people who are not healthy for her, and feel more self confidence and positive about herself and the strength that she is showing as she works on goals in her treatment plan in trying to move forward in positive ways and confront difficult situations with more optimism and belief in herself.  Goal review and progress/challenges noted with patient.  Next appointment within 3 to 4 weeks.  Shanon Ace, LCSW

## 2020-10-12 ENCOUNTER — Ambulatory Visit: Payer: 59 | Admitting: Psychiatry

## 2020-10-17 ENCOUNTER — Ambulatory Visit: Payer: 59 | Admitting: Psychiatry

## 2020-10-18 ENCOUNTER — Other Ambulatory Visit: Payer: Self-pay | Admitting: Emergency Medicine

## 2020-10-18 DIAGNOSIS — Z760 Encounter for issue of repeat prescription: Secondary | ICD-10-CM

## 2020-10-21 ENCOUNTER — Other Ambulatory Visit: Payer: Self-pay | Admitting: Physician Assistant

## 2020-11-09 ENCOUNTER — Ambulatory Visit (INDEPENDENT_AMBULATORY_CARE_PROVIDER_SITE_OTHER): Payer: 59 | Admitting: Psychiatry

## 2020-11-09 ENCOUNTER — Other Ambulatory Visit: Payer: Self-pay

## 2020-11-09 DIAGNOSIS — F411 Generalized anxiety disorder: Secondary | ICD-10-CM

## 2020-11-09 NOTE — Progress Notes (Signed)
[facility] Counselor/Therapist Progress Note  Patient ID: Helen Greene, MRN: [id number],    Date: 11/09/2020  Time Spent: 60 minutes  Treatment Type: Individual Therapy  Reported Symptoms: Anxiety, frustration, needing better self-care and self-talk  Mental Status Exam:  Appearance:   Casual and Neat     Behavior:  Appropriate, Sharing and Motivated  Motor:  Normal  Speech/Language:   Clear and Coherent  Affect:  anxious  Mood:  anxious  Thought process:  goal directed  Thought content:    WNL  Sensory/Perceptual disturbances:    WNL  Orientation:  oriented to person, place, time/date, situation, day of week, month of year and year  Attention:  Fair  Concentration:  Fair  Memory:  WNL  Fund of knowledge:   Good  Insight:    Good  Judgment:   Good  Impulse Control:  Fair   Risk Assessment: Danger to Self:  No Self-injurious Behavior: No Danger to Others: No Duty to Warn:no Physical Aggression / Violence:No  Access to Firearms a concern: No  Gang Involvement:No   Subjective:  Patient in today reporting anxiety mostly related to work, Copywriter, advertising, 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 Getting more nervous to drive at night and is taking precautions on that. Issues within friendships that are creating much stress for patient as well as "feeling used" and realizing she is still needing to set clear limits but that has not happened yet.  States she gets anxious when she tries to make any changes like setting limits with others and doubts herself.  Also shared some growing concerns she has recognized about her phone and how much it controls her "every day and is getting worse" and recognizing it's making her anxiety worse.  Also knows she needs more physical activity as her job is a "sitting job" and she notices starting to feel more stiff."  Seem to be an important issue for her today as she vented a lot of thoughts and emotions about it.  We discussed strategies and she agreed to practice getting off  her phone at least at hourly intervals after work, at least until her next appointment.  She is also to begin some form of exercise at home and she does have access to a treadmill so she agreed to do some form of exercise nightly after work.  We will revisit this at next session and tried to make her strategies doable so that she will actually stick to them rather than for her to feel like there out of her reach.  I think it is a real positive thing that she brought this up in session as it had not been discussed before and from hearing her out, it does seem like it is a factor in her anxiety.     Interventions: Solution-Oriented/Positive Psychology, Ego-Supportive and Insight-Oriented  Diagnosis:   ICD-10-CM   1. Generalized anxiety disorder  F41.1     Treatment Goal Plan:  Patient not signing tx plan on computer screen due to COVID. Treatment Goals:Goals remain on tx plan as patient works on strategies  to meet her goals. Progress will be documented each session in "Progress" section on plan.  Long term goal: Develop the ability to recognize, accept, and cope with feelings ofanxiety anddepression.  Short term goal: Identify and replaceanxious anddepressive thinking that leads toanxious ordepressive feelings and actions.Replace with more realistic and empowering thoughts. Strategy: Reinforce patient's positive, reality based cognitive messages that enhance self-confidence and increase adaptive behaviors/action.    Progress /  Plan: Patient today was disclosing today with some information that was helpful in reference to trying to work with her in decreasing her anxiety.  She acknowledged that she "has become very over dependent on her phone" and that is actually affecting her in multiple ways.  It is aggravating her anxiety in some ways and she notices that she is sitting a lot more in addition to her day job and that she is feeling more stiff because of that.  Discussed strategies  as noted above and she has some specific assignments to be working on before next session related to withdrawing some from her phone and setting better limits.  Encouraged patient to follow through on this and she seems genuinely concerned and committed to doing so.  Also encouraged her to be more consistent with her positive self-care and self talk, to stay in touch with people who are supportive of her, to remain in the present focusing on what she can control or change, to practice deep breathing exercises with the anxiety, to say "no" when she needs to to others, set appropriate boundaries with other people, feel more self confidence and positive about herself, get outside some daily and walk, and feel good about the strength she is showing and working on goal-directed behaviors as she tries to move forward in a more positive direction to improve her overall emotional health.  She does seem to be increasing in self-awareness, wanting to manage stressors more effectively, and wanting to take better care of herself.  Goal review and progress/challenges noted with patient.  Next appointment within 3 to 4 weeks.   Mathis Fare, LCSW

## 2020-11-29 ENCOUNTER — Emergency Department (HOSPITAL_COMMUNITY)
Admission: EM | Admit: 2020-11-29 | Discharge: 2020-11-29 | Disposition: A | Discharge status: Home / Self Care | Payer: Managed Care, Other (non HMO) | Attending: Emergency Medicine | Admitting: Emergency Medicine

## 2020-11-29 ENCOUNTER — Encounter (HOSPITAL_COMMUNITY): Payer: Self-pay

## 2020-11-29 ENCOUNTER — Emergency Department (HOSPITAL_COMMUNITY): Payer: Managed Care, Other (non HMO)

## 2020-11-29 DIAGNOSIS — N200 Calculus of kidney: Secondary | ICD-10-CM | POA: Diagnosis not present

## 2020-11-29 DIAGNOSIS — I10 Essential (primary) hypertension: Secondary | ICD-10-CM | POA: Diagnosis not present

## 2020-11-29 DIAGNOSIS — R109 Unspecified abdominal pain: Secondary | ICD-10-CM | POA: Diagnosis present

## 2020-11-29 DIAGNOSIS — K219 Gastro-esophageal reflux disease without esophagitis: Secondary | ICD-10-CM | POA: Diagnosis not present

## 2020-11-29 DIAGNOSIS — Z79899 Other long term (current) drug therapy: Secondary | ICD-10-CM | POA: Insufficient documentation

## 2020-11-29 LAB — URINALYSIS, ROUTINE W REFLEX MICROSCOPIC
Bilirubin Urine: NEGATIVE
Glucose, UA: NEGATIVE mg/dL
Hgb urine dipstick: NEGATIVE
Ketones, ur: NEGATIVE mg/dL
Nitrite: NEGATIVE
Protein, ur: 30 mg/dL — AB
Specific Gravity, Urine: 1.026 (ref 1.005–1.030)
pH: 5 (ref 5.0–8.0)

## 2020-11-29 LAB — CBC WITH DIFFERENTIAL/PLATELET
Abs Immature Granulocytes: 0.04 10*3/uL (ref 0.00–0.07)
Basophils Absolute: 0 10*3/uL (ref 0.0–0.1)
Basophils Relative: 0 %
Eosinophils Absolute: 0.1 10*3/uL (ref 0.0–0.5)
Eosinophils Relative: 1 %
HCT: 43.5 % (ref 36.0–46.0)
Hemoglobin: 14.4 g/dL (ref 12.0–15.0)
Immature Granulocytes: 0 %
Lymphocytes Relative: 18 %
Lymphs Abs: 1.9 10*3/uL (ref 0.7–4.0)
MCH: 30.4 pg (ref 26.0–34.0)
MCHC: 33.1 g/dL (ref 30.0–36.0)
MCV: 92 fL (ref 80.0–100.0)
Monocytes Absolute: 1.1 10*3/uL — ABNORMAL HIGH (ref 0.1–1.0)
Monocytes Relative: 10 %
Neutro Abs: 7.6 10*3/uL (ref 1.7–7.7)
Neutrophils Relative %: 71 %
Platelets: 322 10*3/uL (ref 150–400)
RBC: 4.73 MIL/uL (ref 3.87–5.11)
RDW: 12.8 % (ref 11.5–15.5)
WBC: 10.7 10*3/uL — ABNORMAL HIGH (ref 4.0–10.5)
nRBC: 0 % (ref 0.0–0.2)

## 2020-11-29 LAB — BASIC METABOLIC PANEL
Anion gap: 11 (ref 5–15)
BUN: 20 mg/dL (ref 6–20)
CO2: 23 mmol/L (ref 22–32)
Calcium: 9.5 mg/dL (ref 8.9–10.3)
Chloride: 102 mmol/L (ref 98–111)
Creatinine, Ser: 1.35 mg/dL — ABNORMAL HIGH (ref 0.44–1.00)
GFR, Estimated: 48 mL/min — ABNORMAL LOW (ref >60)
Glucose, Bld: 113 mg/dL — ABNORMAL HIGH (ref 70–99)
Potassium: 3.8 mmol/L (ref 3.5–5.1)
Sodium: 136 mmol/L (ref 135–145)

## 2020-11-29 LAB — I-STAT BETA HCG BLOOD, ED (MC, WL, AP ONLY): I-stat hCG, quantitative: <5 m[IU]/mL (ref <5)

## 2020-11-29 MED ORDER — OXYCODONE HCL 5 MG PO TABS: 5.0000 mg | ORAL_TABLET | Freq: Four times a day (QID) | ORAL | 0 refills | Status: AC | PRN

## 2020-11-29 MED ORDER — SODIUM CHLORIDE 0.9 % IV BOLUS
1000.0000 mL | Freq: Once | INTRAVENOUS | Status: AC
  Administered 2020-11-29: 09:00:00 1000 mL via INTRAVENOUS

## 2020-11-29 MED ORDER — OXYCODONE-ACETAMINOPHEN 5-325 MG PO TABS
1.0000 | ORAL_TABLET | Freq: Once | ORAL | Status: AC
  Administered 2020-11-29: 05:00:00 1 via ORAL
  Filled 2020-11-29: qty 1

## 2020-11-29 MED ORDER — ONDANSETRON HCL 4 MG/2ML IJ SOLN
4.0000 mg | Freq: Once | INTRAMUSCULAR | Status: AC
  Administered 2020-11-29: 09:00:00 4 mg via INTRAVENOUS
  Filled 2020-11-29: qty 2

## 2020-11-29 MED ORDER — ONDANSETRON 4 MG PO TBDP: 4.0000 mg | ORAL_TABLET | Freq: Three times a day (TID) | ORAL | 0 refills | Status: DC | PRN

## 2020-11-29 MED ORDER — KETOROLAC TROMETHAMINE 30 MG/ML IJ SOLN
30.0000 mg | Freq: Once | INTRAMUSCULAR | Status: AC
  Administered 2020-11-29: 09:00:00 30 mg via INTRAVENOUS
  Filled 2020-11-29: qty 1

## 2020-11-29 MED ORDER — TAMSULOSIN HCL 0.4 MG PO CAPS: 0.4000 mg | ORAL_CAPSULE | Freq: Every day | ORAL | 0 refills | Status: AC

## 2020-11-29 NOTE — Discharge Instructions (Addendum)
Your CT scan showed a kidney stone.    Take the medications as prescribed.  Do not drive or operate heavy machinery while taking the oxycodone.  This medication is an opiate may become addictive.  I have also given you a medication called Flomax.  As discussed earlier this medication may make you lightheaded when you go from sitting to standing.  Make sure to sit on the edge of bed and dangle your feet for a few minutes prior to walking.  Follow-up with [facility] for continued have pain beyond 2 to 3 days  Return for new or worsening symptoms

## 2020-11-29 NOTE — ED Triage Notes (Signed)
Pt states that she has been having R sided flank pain, hx of stones

## 2020-11-29 NOTE — ED Provider Notes (Signed)
[facility] EMERGENCY DEPARTMENT Provider Note   CSN: [id number] Arrival date & time: 11/29/20  0418     History Chief Complaint  Patient presents with   Flank Pain    Helen Greene is a 50 y.o. female with past medical history for chronic leukocytosis, sleep apnea, hypertension, kidney stones who presents for evaluation of right flank pain.  States she has noted some intermittent right flank pain over the last week.  Became more severe and constant over the night.  Described as sharp and stabbing.  Feels like her prior history of kidney stones.  No fever, chills, emesis, dysuria, hematuria, weakness.  She has been seen by [facility] previously however it has been many years.  Denies additional aggravating or alleviating factors.  Initially her pain was a 6/10 at triage was given Percocet her pain has improved to a 3/10.  States Toradol previously has significantly help with her prior kidney stones.  History obtained from patient and past medical records.  No interpreter used.  HPI     Past Medical History:  Diagnosis Date   Allergy    Anxiety    Depression    Elevated white blood cell count    Elevated white blood cell count 09/25/2011   GERD (gastroesophageal reflux disease)    Headache    Migraine   HSV-2 infection    Hypertension    Kidney stone    Neuromuscular disorder (HCC)    carpel tunnel disease   Pneumonia 08/15/2017   Restless leg syndrome    Shortness of breath dyspnea    with exertion   Sleep apnea    does not have CPAP machine, mostly affects back sleeping was encouraged to sleep on her sides   STD (sexually transmitted disease)    hsv 1&2   Tonsillar calculus     Patient Active Problem List   Diagnosis Date Noted   Seasonal allergic rhinitis 10/09/2018   Major depressive disorder, recurrent episode, moderate (HCC) 04/01/2018   Hypertension 08/15/2017   Morbid obesity with BMI of 40.0-44.9, adult (HCC) 08/15/2017   S/P  tonsillectomy 08/11/2017   S/P total hysterectomy 09/14/2015   RLS (restless legs syndrome) 03/17/2015   Essential hypertension 05/19/2013    Past Surgical History:  Procedure Laterality Date   COLONOSCOPY     DILATION AND CURETTAGE OF UTERUS  1993   ECTOPIC PREGNANCY SURGERY     ESSURE TUBAL LIGATION     LAPAROSCOPIC CHOLECYSTECTOMY W/ CHOLANGIOGRAPHY  02/25/2001   Dr Lindie Spruce   LITHOTRIPSY  12/22/2014   ROBOTIC ASSISTED TOTAL HYSTERECTOMY WITH SALPINGECTOMY Left 09/14/2015   Procedure: ROBOTIC ASSISTED TOTAL HYSTERECTOMY WITH LEFT SALPINGECTOMY;  Surgeon: Noland Fordyce, MD;  Location: [facility];  Service: Gynecology;  Laterality: Left;   TONSILLECTOMY N/A 08/11/2017   Procedure: TONSILLECTOMY;  Surgeon: Serena Colonel, MD;  Location: [facility];  Service: ENT;  Laterality: N/A;   UPPER GI ENDOSCOPY     WISDOM TOOTH EXTRACTION       OB History     Gravida  3   Para  2   Term      Preterm      AB  1   Living  2      SAB      IAB      Ectopic  1   Multiple      Live Births              Family History  Problem  Relation Age of Onset   Hypertension Mother    Heart failure Mother    Cancer Mother    Heart disease Mother    Heart disease Brother    Hypertension Brother    Cancer Father        Leukemia   Hypertension Father    Cancer Sister    Breast cancer Sister    Diabetes Other    Colon cancer Neg Hx    Esophageal cancer Neg Hx    Rectal cancer Neg Hx    Stomach cancer Neg Hx     Social History   Tobacco Use   Smoking status: Never   Smokeless tobacco: Never  Vaping Use   Vaping Use: Never used  Substance Use Topics   Alcohol use: Yes    Alcohol/week: 1.0 standard drink    Types: 1 Glasses of wine per week    Comment: occas   Drug use: No    Home Medications Prior to Admission medications   Medication Sig Start Date End Date Taking? Authorizing Provider  ondansetron (ZOFRAN ODT) 4 MG disintegrating tablet Take 1 tablet (4  mg total) by mouth every 8 (eight) hours as needed for nausea or vomiting. 11/29/20  Yes Jadeyn Hargett A, PA-C  oxyCODONE (ROXICODONE) 5 MG immediate release tablet Take 1 tablet (5 mg total) by mouth every 6 (six) hours as needed for up to 3 days for severe pain. 11/29/20 12/02/20 Yes Yosgart Pavey A, PA-C  tamsulosin (FLOMAX) 0.4 MG CAPS capsule Take 1 capsule (0.4 mg total) by mouth daily for 7 days. 11/29/20 12/06/20 Yes Abigail Teall A, PA-C  ARIPiprazole (ABILIFY) 2 MG tablet TAKE 1 TABLET EVERY MORNING 10/24/20   Hurst, Rosey Batheresa T, PA-C  atorvastatin (LIPITOR) 10 MG tablet Take 1 tablet (10 mg total) by mouth every other day. 09/28/19   Doristine BosworthStallings, Zoe A, MD  buPROPion (WELLBUTRIN XL) 300 MG 24 hr tablet Take 1 tablet (300 mg total) by mouth every morning. 07/03/20   Melony OverlyHurst, Teresa T, PA-C  escitalopram (LEXAPRO) 20 MG tablet Take 1 tablet (20 mg total) by mouth daily. 07/03/20   Melony OverlyHurst, Teresa T, PA-C  Ferrous Sulfate (IRON PO) Take by mouth.     [provider]  lidocaine (XYLOCAINE) 5 % ointment Apply 1 application topically 3 (three) times daily. 09/11/20   Clarita Craneixon, Kelly, NP  LORazepam (ATIVAN) 0.5 MG tablet Take 1 tablet (0.5 mg total) by mouth every 8 (eight) hours as needed for anxiety. 11/24/18   Melony OverlyHurst, Teresa T, PA-C  metFORMIN (GLUCOPHAGE) 500 MG tablet Take 500 mg by mouth 2 (two) times daily. 04/06/20   [provider]  metroNIDAZOLE (METROGEL) 0.75 % vaginal gel Use 1 applicator full vaginally hs x 5 nights 09/11/20   Clarita Craneixon, Kelly, NP  omeprazole (PRILOSEC) 20 MG capsule Take 1 capsule (20 mg total) by mouth daily. 09/27/19   Doristine BosworthStallings, Zoe A, MD  pramipexole (MIRAPEX) 0.25 MG tablet TAKE 1 TABLET AT BEDTIME 07/14/20   Melony OverlyHurst, Teresa T, PA-C  telmisartan-hydrochlorothiazide (MICARDIS HCT) 40-12.5 MG tablet TAKE 1 TABLET DAILY 06/20/20   Georgina QuintSagardia, Miguel Jose, MD  valACYclovir (VALTREX) 500 MG tablet Take 1 tablet (500 mg total) by mouth 2 (two) times daily. For 3-5 days during  outbreak 09/13/20   Clarita Craneixon, Kelly, NP    Allergies    Bee venom, Enablex [darifenacin hydrobromide], Erythromycin, Macrobid [nitrofurantoin monohyd macro], Metronidazole, Nitrofuran derivatives, and Risperidone and related  Review of Systems   Review of Systems  Constitutional: Negative.  HENT: Negative.    Respiratory: Negative.    Cardiovascular: Negative.   Gastrointestinal: Negative.   Genitourinary:  Positive for flank pain. Negative for decreased urine volume, difficulty urinating, dysuria, frequency, hematuria, menstrual problem, pelvic pain, urgency, vaginal bleeding, vaginal discharge and vaginal pain.  Musculoskeletal:  Negative for arthralgias, gait problem, joint swelling, myalgias, neck pain and neck stiffness.  Skin: Negative.   Neurological: Negative.   All other systems reviewed and are negative.  Physical Exam Updated Vital Signs BP 105/64   Pulse 96   Temp 98.9 F (37.2 C) (Oral)   Resp 17   LMP 09/07/2015 (Exact Date)   SpO2 97%   Physical Exam Vitals and nursing note reviewed.  Constitutional:      General: She is not in acute distress.    Appearance: Normal appearance. She is well-developed. She is not ill-appearing, toxic-appearing or diaphoretic.  HENT:     Head: Normocephalic and atraumatic.     Nose: Nose normal.     Mouth/Throat:     Mouth: Mucous membranes are moist.  Eyes:     Pupils: Pupils are equal, round, and reactive to light.  Cardiovascular:     Rate and Rhythm: Normal rate.     Pulses: Normal pulses.     Heart sounds: Normal heart sounds.  Pulmonary:     Effort: Pulmonary effort is normal. No respiratory distress.     Breath sounds: Normal breath sounds.  Abdominal:     General: Bowel sounds are normal. There is no distension.     Palpations: Abdomen is soft.     Tenderness: There is no abdominal tenderness. There is no right CVA tenderness, left CVA tenderness, guarding or rebound.     Hernia: No hernia is present.   Musculoskeletal:        General: No swelling or tenderness. Normal range of motion.     Cervical back: Normal range of motion.     Right lower leg: No edema.     Left lower leg: No edema.  Skin:    General: Skin is warm and dry.     Capillary Refill: Capillary refill takes less than 2 seconds.  Neurological:     General: No focal deficit present.     Mental Status: She is alert and oriented to person, place, and time.  Psychiatric:        Mood and Affect: Mood normal.    ED Results / Procedures / Treatments   Labs (all labs ordered are listed, but only abnormal results are displayed) Labs Reviewed  CBC WITH DIFFERENTIAL/PLATELET - Abnormal; Notable for the following components:      Result Value   WBC 10.7 (*)    Monocytes Absolute 1.1 (*)    All other components within normal limits  URINALYSIS, ROUTINE W REFLEX MICROSCOPIC - Abnormal; Notable for the following components:   APPearance HAZY (*)    Protein, ur 30 (*)    Leukocytes,Ua TRACE (*)    Bacteria, UA RARE (*)    All other components within normal limits  BASIC METABOLIC PANEL - Abnormal; Notable for the following components:   Glucose, Bld 113 (*)    Creatinine, Ser 1.35 (*)    GFR, Estimated 48 (*)    All other components within normal limits  I-STAT BETA HCG BLOOD, ED ([facility], [facility], [facility] ONLY)    EKG None  Radiology CT Renal Stone Study  Result Date: 11/29/2020 CLINICAL DATA:  Right flank pain, stone disease suspected, history of  urolithiasis and lithotripsy EXAM: CT ABDOMEN AND PELVIS WITHOUT CONTRAST TECHNIQUE: Multidetector CT imaging of the abdomen and pelvis was performed following the standard protocol without IV contrast. COMPARISON:  CT 03/19/2020 FINDINGS: Lower chest: Lung bases are clear. Normal heart size. No pericardial effusion. Hepatobiliary: Diffuse hepatic hypoattenuation compatible with hepatic steatosis. No concerning focal liver lesion. Surgical clips in the gallbladder fossa compatible with prior  cholecystectomy. No significant biliary ductal dilatation or visible intraductal gallstones. Pancreas: No pancreatic ductal dilatation or surrounding inflammatory changes. Spleen: Normal in size. No concerning splenic lesions. Adrenals/Urinary Tract: Normal adrenal glands. No visible or contour deforming renal lesions. Asymmetric right perinephric and proximal periureteral stranding with moderate hydronephrosis to the level of a 4 mm calculus near the ureteropelvic junction (5/50). No additional distal ureteral calculi are seen. Coarse 11 mm nonobstructing calculus present in the lower pole left kidney. Urinary bladder is largely decompressed at the time of exam and therefore poorly evaluated by CT imaging. No gross bladder abnormality accounting for underdistention. No visible. Stomach/Bowel: Distal esophagus, stomach and duodenal sweep are unremarkable. No small bowel wall thickening or dilatation. No evidence of obstruction. Appendix in the low midline pelvis extending from the cecal tip. No periappendiceal inflammation. Few scattered noninflamed colonic diverticula. No colonic dilatation or wall thickening. Vascular/Lymphatic: No significant vascular findings are present. No enlarged abdominal or pelvic lymph nodes. Reproductive: Uterus is surgically absent. No concerning adnexal lesions. Other: No abdominopelvic free fluid or free gas. No bowel containing hernias. Musculoskeletal: No acute osseous abnormality or suspicious osseous lesion. Minimal degenerative changes in the spine and hips. IMPRESSION: 1. Moderate hydronephrosis with perinephric and periureteral stranding to the level of a 4 mm calculus positioned at the ureteropelvic junction (5/50). 2. Additional nonobstructing calculus in the left lower pole. 3. Diffusely hypoattenuating liver parenchyma compatible with hepatic steatosis. Prior cholecystectomy. Electronically Signed   By: Kreg Shropshire M.D.   On: 11/29/2020 05:57    Procedures Procedures    Medications Ordered in ED Medications  oxyCODONE-acetaminophen (PERCOCET/ROXICET) 5-325 MG per tablet 1 tablet (1 tablet Oral Given 11/29/20 0432)  sodium chloride 0.9 % bolus 1,000 mL (0 mLs Intravenous Stopped 11/29/20 0947)  ondansetron (ZOFRAN) injection 4 mg (4 mg Intravenous Given 11/29/20 0917)  ketorolac (TORADOL) 30 MG/ML injection 30 mg (30 mg Intravenous Given 11/29/20 2947)    ED Course  I have reviewed the triage vital signs and the nursing notes.  Pertinent labs & imaging results that were available during my care of the patient were reviewed by me and considered in my medical decision making (see chart for details).  50 year old here for evaluation of right flank pain.  Feels consistent with her prior kidney stones.  She is afebrile, nonseptic, non-ill-appearing.  No hematuria, dysuria.  No infectious symptoms.  Discussed labs and imaging obtained from triage.  Will give IV fluids, antiemetic, pain control and reassess  Work-up started from triage today personally reviewed and interpreted:  CBC with leukocytosis at 10.7, improved from multiple prior labs Metabolic panel glucose 113, creatinine 1.35, mild increase from baseline at 0.9 UA negative for infection CT stone with right hydronephrosis with stranding due to a 4 mm UPJ stone  Patient reassessed. Pain controlled.  Tolerating p.o. intake.  DC home with symptomatic management.  No evidence of infectious process at this time.  She will follow-up with [facility] whom she is seen previously if her symptoms persist.  Will return here for new or worsening symptoms.  Patient does not meet the SIRS  or Sepsis criteria.  On repeat exam patient does not have a surgical abdomin and there are no peritoneal signs.  No indication of appendicitis, bowel obstruction, bowel perforation, cholecystitis, diverticulitis, PID or ectopic pregnancy.    The patient has been appropriately medically screened and/or stabilized in the ED. I  have low suspicion for any other emergent medical condition which would require further screening, evaluation or treatment in the ED or require inpatient management.  Patient is hemodynamically stable and in no acute distress.  Patient able to ambulate in department prior to ED.  Evaluation does not show acute pathology that would require ongoing or additional emergent interventions while in the emergency department or further inpatient treatment.  I have discussed the diagnosis with the patient and answered all questions.  Pain is been managed while in the emergency department and patient has no further complaints prior to discharge.  Patient is comfortable with plan discussed in room and is stable for discharge at this time.  I have discussed strict return precautions for returning to the emergency department.  Patient was encouraged to follow-up with PCP/specialist refer to at discharge.       MDM Rules/Calculators/A&P                          Patient with right UPJ stone.  No infectious symptoms.  Does not appear infected.  Tolerating p.o. intake here.  Pain controlled.  DC home with strict return precaution Final Clinical Impression(s) / ED Diagnoses Final diagnoses:  Right kidney stone    Rx / DC Orders ED Discharge Orders          Ordered    oxyCODONE (ROXICODONE) 5 MG immediate release tablet  Every 6 hours PRN        11/29/20 1049    tamsulosin (FLOMAX) 0.4 MG CAPS capsule  Daily        11/29/20 1049    ondansetron (ZOFRAN ODT) 4 MG disintegrating tablet  Every 8 hours PRN        11/29/20 1049             Shawntay Prest A, PA-C 11/29/20 1053    Terrilee Files, MD 11/29/20 2100

## 2020-11-29 NOTE — ED Provider Notes (Signed)
Emergency Medicine Provider Triage Evaluation Note  Helen Greene , a 50 y.o. female  was evaluated in triage.  Pt complains of right flank pain.  Has been worse tonight, having a hard time getting comfortable and has not been able to sleep.  Some nausea without vomiting.  Reports low grade fever at home (afebrile here).  Denies dysuria.  Hx of stones in the past requiring lithotripsy.  Review of Systems  Positive: Flank pain, nausea Negative: Dysuria, vomiting  Physical Exam  BP 138/87 (BP Location: Right Arm)   Pulse 100   Temp 98.2 F (36.8 C) (Oral)   Resp 18   LMP 09/07/2015 (Exact Date)   SpO2 94%   Gen:   Awake, appears uncomfortable, changing position in chair frequently Resp:  Normal effort MSK:   Moves extremities without difficulty  Medical Decision Making  Medically screening exam initiated at 4:24 AM.  Appropriate orders placed.  Jasmine Awe was informed that the remainder of the evaluation will be completed by another provider, this initial triage assessment does not replace that evaluation, and the importance of remaining in the ED until their evaluation is complete.   Garlon Hatchet, PA-C 11/29/20 0431    Milagros Loll, MD 11/30/20 [phone]

## 2020-12-05 ENCOUNTER — Other Ambulatory Visit: Payer: Self-pay | Admitting: Physician Assistant

## 2020-12-07 ENCOUNTER — Ambulatory Visit: Payer: 59 | Admitting: Psychiatry

## 2020-12-08 NOTE — Telephone Encounter (Signed)
Due back in May. Okay to send 90 day? 30 day?    ADM: please get pt scheduled with Rosey Bath to continue refills

## 2020-12-12 NOTE — Telephone Encounter (Signed)
Pt has an appt on 7/1 

## 2020-12-15 ENCOUNTER — Ambulatory Visit: Payer: 59 | Admitting: Physician Assistant

## 2020-12-22 ENCOUNTER — Ambulatory Visit: Payer: 59 | Admitting: Physician Assistant

## 2021-01-05 ENCOUNTER — Other Ambulatory Visit: Payer: Self-pay | Admitting: Physician Assistant

## 2021-01-09 ENCOUNTER — Other Ambulatory Visit: Payer: Self-pay

## 2021-01-09 ENCOUNTER — Ambulatory Visit (INDEPENDENT_AMBULATORY_CARE_PROVIDER_SITE_OTHER): Payer: 59 | Admitting: Psychiatry

## 2021-01-09 DIAGNOSIS — F411 Generalized anxiety disorder: Secondary | ICD-10-CM

## 2021-01-09 NOTE — Progress Notes (Signed)
[facility] Counselor/Therapist Progress Note  Patient ID: EILIYAH REH, MRN: [id number],    Date: 01/09/2021  Time Spent:  58 minutes  Treatment Type: Individual Therapy  Reported Symptoms: anxiety, frustration  Mental Status Exam:  Appearance:   Neat     Behavior:  Appropriate, Sharing, and Motivated  Motor:  Normal  Speech/Language:   Clear and Coherent and Normal Rate  Affect:  Anxious, frustrated  Mood:  anxious and frustrated  Thought process:  goal directed  Thought content:    WNL  Sensory/Perceptual disturbances:    WNL  Orientation:  oriented to person, place, time/date, situation, day of week, month of year, year, and stated date of January 09, 2021  Attention:  Good  Concentration:  Fair  Memory:  WNL  Fund of knowledge:   Good  Insight:    Good and Fair  Judgment:   Good and Fair  Impulse Control:  Good   Risk Assessment: Danger to Self:  No Self-injurious Behavior: No Danger to Others: No Duty to Warn:no Physical Aggression / Violence:No  Access to Firearms a concern: No  Gang Involvement:No   Subjective:  Patient in today reporting anxiety and frustration. Quite frustrated and anxious today re: issues with 2 friendships and family relationships. Processed several issues that have been contributing to her anxiety including: issues within family that lead to hurt for patient, concerns with friend who is staying at her home, and the stress over finding a home to move to.  Today focused more on hurtful issues and some feeling used within family relationships and setting limits with friend staying at her home, all of which contribute to her anxiety and frustration. Worked on some communication strategies specifically related to setting healthier boundaries, and also active listening to really hear what a person is saying to [place] rather that listening just respond, and "paying attention to how we say what we say". Patient participated well in our discussion showing  some improved insight .Also discussed ways she can get some exercise after having to sit all day at her job. Did buy a used treadmill and hasn't consistently used it. Encouraged to be realistic and start with smaller distance that might feel doable and can increase it gradually over time. Agrees to try this between sessions and report out next session.  Interventions: Cognitive Behavioral Therapy and Solution-Oriented/Positive Psychology  Diagnosis:   ICD-10-CM   1. Generalized anxiety disorder  F41.1       Treatment Goal Plan:  Patient not signing tx plan on computer screen due to COVID. Treatment Goals:Goals remain on tx plan as patient works on strategies to meet her goals. Progress will be documented each session in "Progress" section on plan. Long term goal: Develop the ability to recognize, accept, and cope with feelings of anxiety and  depression. Short term goal: Identify and replace anxious and depressive thinking that leads to anxious or depressive feelings and actions. Replace with more realistic and empowering thoughts.  Strategy: Reinforce patient's positive, reality based cognitive messages that enhance self-confidence and increase adaptive behaviors/action.    Plan:  Patient today showing some motivation,and it did increase over the time of our session. Worked well on some specific issues that are aggravating her anxiety and frustration, and was able work through some of the feelings she has been having and see where she is needing to set some healthier boundaries and stick to them.  Self awareness improving.  Still working to limit time on her  phone because she realized and talked last session about being overly dependent on it and it also aggravates her anxiety.  She has decreased the time on it and is working between now and next session on decreasing it further.  Encouraged patient in continuing some behaviors that have been helpful to her in between sessions previously  including: Reviewing the issues we spoke about today and suggested behaviors, be consistent with her positive self-care and positive self talk, remain in the present focusing on what she can control or change versus cannot, stay in touch with people who are supportive of her, practice deep breathing exercises to help with her anxiety, being able to say no when she needs to say no to others, set appropriate boundaries with other people, feel more self confident and positive about herself, get outside each day and walk, healthy nutrition and physical movement, and recognize the strengths she is showing as she works with goal-directed behaviors trying to move forward in a more positive direction to improve her overall emotional health.  Goal review and progress/challenges noted with patient.  Next appt within 4 weeks.   Mathis Fare, LCSW

## 2021-01-24 ENCOUNTER — Encounter: Payer: Self-pay | Admitting: Physician Assistant

## 2021-01-24 ENCOUNTER — Other Ambulatory Visit: Payer: Self-pay

## 2021-01-24 ENCOUNTER — Ambulatory Visit (INDEPENDENT_AMBULATORY_CARE_PROVIDER_SITE_OTHER): Payer: 59 | Admitting: Physician Assistant

## 2021-01-24 DIAGNOSIS — F3342 Major depressive disorder, recurrent, in full remission: Secondary | ICD-10-CM

## 2021-01-24 DIAGNOSIS — F411 Generalized anxiety disorder: Secondary | ICD-10-CM

## 2021-01-24 DIAGNOSIS — Z79899 Other long term (current) drug therapy: Secondary | ICD-10-CM | POA: Diagnosis not present

## 2021-01-24 DIAGNOSIS — G2581 Restless legs syndrome: Secondary | ICD-10-CM | POA: Diagnosis not present

## 2021-01-24 DIAGNOSIS — E559 Vitamin D deficiency, unspecified: Secondary | ICD-10-CM

## 2021-01-24 MED ORDER — LORAZEPAM 0.5 MG PO TABS: 0.5000 mg | ORAL_TABLET | Freq: Three times a day (TID) | ORAL | 1 refills | Status: DC | PRN

## 2021-01-24 MED ORDER — ARIPIPRAZOLE 2 MG PO TABS: 2.0000 mg | ORAL_TABLET | Freq: Every morning | ORAL | 0 refills | Status: DC

## 2021-01-24 NOTE — Progress Notes (Signed)
[facility] Med Check  Patient ID: Helen Greene,  MRN: [id number]  PCP: Doristine Bosworth, MD  Date of Evaluation: 01/24/2021 Time spent:40 minutes  Chief Complaint:  Chief Complaint   Anxiety; Depression; Follow-up    HISTORY/CURRENT STATUS:  For routine med check.  States she is doing really well.  Feels like her medications are still effective.  She is able to enjoy things.  Energy and motivation are good.  She is able to work without difficulty, was expecting to be laid off in the early summer but thankfully that did not occur.  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  Not isolating.  Does not cry easily.  She sleeps well most of the time.  No suicidal or homicidal thoughts.  Patient denies increased energy with decreased need for sleep, no increased talkativeness, no racing thoughts, no impulsivity or risky behaviors, no increased spending, no increased libido, no grandiosity, no increased irritability or anger, and no hallucinations.  Anxiety is well controlled.  No panic attacks but more of a generalized sense of unease when she does feel anxious.  The Ativan is still effective.  Denies dizziness, syncope, seizures, numbness, tingling, tremor, tics, unsteady gait, slurred speech, confusion. Denies muscle or joint pain, stiffness, or dystonia.  Individual Medical History/ Review of Systems: Changes? :Yes    Had a kidney stone, went to ER. Unsure if it passed or not. But no more pain. Also had covid last month.   Past medications for mental health diagnoses include: Wellbutrin XL, Risperdal caused edema, Lexapro, Mirapex, Vistaril, questionably Xanax, Abilify, Lamictal, Klonopin, pramipexole for restless legs    Allergies: Bee venom, Enablex [darifenacin hydrobromide], Erythromycin, Macrobid [nitrofurantoin monohyd macro], Metronidazole, Nitrofuran derivatives, and Risperidone and related  Current Medications:  Current Outpatient  Medications:    atorvastatin (LIPITOR) 10 MG tablet, Take 1 tablet (10 mg total) by mouth every other day., Disp: 45 tablet, Rfl: 3   buPROPion (WELLBUTRIN XL) 300 MG 24 hr tablet, Take 1 tablet (300 mg total) by mouth every morning., Disp: 90 tablet, Rfl: 3   Cholecalciferol (VITAMIN D) 50 MCG (2000 UT) CAPS, Take 2 capsules (4,000 Units total) by mouth daily., Disp: 30 capsule, Rfl: 11   escitalopram (LEXAPRO) 20 MG tablet, Take 1 tablet (20 mg total) by mouth daily., Disp: 90 tablet, Rfl: 3   omeprazole (PRILOSEC) 20 MG capsule, Take 1 capsule (20 mg total) by mouth daily., Disp: 90 capsule, Rfl: 3   pramipexole (MIRAPEX) 0.25 MG tablet, TAKE 1 TABLET AT BEDTIME, Disp: 90 tablet, Rfl: 0   telmisartan-hydrochlorothiazide (MICARDIS HCT) 40-12.5 MG tablet, TAKE 1 TABLET DAILY, Disp: 30 tablet, Rfl: 0   valACYclovir (VALTREX) 500 MG tablet, Take 1 tablet (500 mg total) by mouth 2 (two) times daily. For 3-5 days during outbreak, Disp: 30 tablet, Rfl: 5   ARIPiprazole (ABILIFY) 2 MG tablet, Take 1 tablet (2 mg total) by mouth every morning., Disp: 90 tablet, Rfl: 0   Ferrous Sulfate (IRON PO), Take by mouth.  (Patient not taking: Reported on 01/24/2021), Disp: , Rfl:    lidocaine (XYLOCAINE) 5 % ointment, Apply 1 application topically 3 (three) times daily. (Patient not taking: Reported on 01/24/2021), Disp: 1.25 g, Rfl: 0   LORazepam (ATIVAN) 0.5 MG tablet, Take 1 tablet (0.5 mg total) by mouth every 8 (eight) hours as needed for anxiety., Disp: 90 tablet, Rfl: 1   metFORMIN (GLUCOPHAGE) 500 MG tablet, Take 500 mg by mouth 2 (two) times  daily. (Patient not taking: Reported on 01/24/2021), Disp: , Rfl:    metroNIDAZOLE (METROGEL) 0.75 % vaginal gel, Use 1 applicator full vaginally hs x 5 nights (Patient not taking: Reported on 01/24/2021), Disp: 70 g, Rfl: 1   ondansetron (ZOFRAN ODT) 4 MG disintegrating tablet, Take 1 tablet (4 mg total) by mouth every 8 (eight) hours as needed for nausea or vomiting.  (Patient not taking: Reported on 01/24/2021), Disp: 20 tablet, Rfl: 0 Medication Side Effects: none  Family Medical/ Social History: Changes?   Currently in the process of buying at home. Even though she still has a job in the same company, overtime is being cut which she reports will definitely affect her finances.  MENTAL HEALTH EXAM:  Last menstrual period 09/07/2015.There is no height or weight on file to calculate BMI.  General Appearance: Casual, Neat, Well Groomed and Obese  Eye Contact:  Good  Speech:  Clear and Coherent and Normal Rate  Volume:  Normal  Mood:  Euthymic  Affect:  Appropriate  Thought Process:  Goal Directed and Descriptions of Associations: Intact  Orientation:  Full (Time, Place, and Person)  Thought Content: Logical   Suicidal Thoughts:  No  Homicidal Thoughts:  No  Memory:  WNL  Judgement:  Good  Insight:  Good  Psychomotor Activity:  Normal  Concentration:  Concentration: Good and Attention Span: Good  Recall:  Good  Fund of Knowledge: Good  Language: Good  Assets:  Desire for Improvement  ADL's:  Intact  Cognition: WNL  Prognosis:  Good   Labs 11/29/2020 CBC White count 10.7 otherwise normal.   BMP glucose was 113 nonfasting, creatinine 1.35  DIAGNOSES:    ICD-10-CM   1. Recurrent major depressive disorder, in full remission (HCC)  F33.42     2. Encounter for long-term (current) use of medications  Z79.899 VITAMIN D 25 Hydroxy (Vit-D Deficiency, Fractures)    3. Restless leg syndrome  G25.81     4. Generalized anxiety disorder  F41.1     5. Vitamin D deficiency  E55.9 VITAMIN D 25 Hydroxy (Vit-D Deficiency, Fractures)       Receiving Psychotherapy: Yes With Rockne Menghini, LCSW.   RECOMMENDATIONS:  PDMP was reviewed.  No psychiatric controlled drugs are listed. I provided 40 minutes of face to face time during this encounter, including time spent before and after the visit in records review, medical decision making, and charting.  We  discussed her history of low vitamin D level.  Her last labs checked was 09/27/2019 and results were 19.4.  We know that having a low vitamin D can affect mental health, especially with depression.  So recommend a vitamin D level be drawn.  She is taking over-the-counter vitamin D.  If her level is above 50, she can continue with her current treatment, but if lower than that I recommend the 50,000 IU dose weekly.  She verbalizes understanding. She is doing well with her medications so no treatment changes are necessary. Continue Abilify 2 mg every morning. Continue Wellbutrin XL 300 mg, 1 p.o. every morning. Continue Lexapro 20 mg daily. Continue Ativan 0.5 mg every 8 hours as needed anxiety. Continue pramipexole 0.25 mg nightly. Continue vitamin D 2000 IUs, 2 p.o. daily. Continue therapy with Rockne Menghini, LCSW. She will see PCP in the next few months.  Have cholesterol panel drawn at that time. Return in 6 months.    Melony Overly, PA-C

## 2021-01-25 LAB — VITAMIN D 25 HYDROXY (VIT D DEFICIENCY, FRACTURES): Vit D, 25-Hydroxy: 60.5 ng/mL (ref 30.0–100.0)

## 2021-01-25 NOTE — Progress Notes (Signed)
Please let her know the vitamin D level is 60 which is really good.  She does not need the weekly dose of vitamin D.  Continue to take the same over-the-counter dose.

## 2021-01-26 NOTE — Progress Notes (Signed)
Pt informed

## 2021-01-28 MED ORDER — VITAMIN D 50 MCG (2000 UT) PO CAPS: 4000.0000 [IU] | ORAL_CAPSULE | Freq: Every day | ORAL | 11 refills | Status: AC

## 2021-01-30 ENCOUNTER — Telehealth: Payer: Self-pay | Admitting: Physician Assistant

## 2021-01-30 NOTE — Telephone Encounter (Signed)
[facility]  cannot see Dr. Alwyn Ren supervising DEA number on [employer]. Please call to advise- [phone] opt 2, Ref # [phone] [id number]

## 2021-01-30 NOTE — Telephone Encounter (Signed)
[facility] was contacted

## 2021-02-01 ENCOUNTER — Other Ambulatory Visit: Payer: Self-pay

## 2021-02-01 ENCOUNTER — Ambulatory Visit (INDEPENDENT_AMBULATORY_CARE_PROVIDER_SITE_OTHER): Payer: 59 | Admitting: Psychiatry

## 2021-02-01 DIAGNOSIS — F411 Generalized anxiety disorder: Secondary | ICD-10-CM | POA: Diagnosis not present
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
## 2021-02-05 ENCOUNTER — Other Ambulatory Visit: Payer: Self-pay | Admitting: Obstetrics & Gynecology

## 2021-02-05 DIAGNOSIS — Z1231 Encounter for screening mammogram for malignant neoplasm of breast: Secondary | ICD-10-CM

## 2021-02-07 ENCOUNTER — Ambulatory Visit
Admission: RE | Admit: 2021-02-07 | Discharge: 2021-02-07 | Disposition: A | Discharge status: Home / Self Care | Payer: Managed Care, Other (non HMO) | Source: Ambulatory Visit | Attending: Obstetrics & Gynecology | Admitting: Obstetrics & Gynecology

## 2021-02-07 ENCOUNTER — Other Ambulatory Visit: Payer: Self-pay

## 2021-02-07 DIAGNOSIS — Z1231 Encounter for screening mammogram for malignant neoplasm of breast: Secondary | ICD-10-CM

## 2021-02-18 ENCOUNTER — Other Ambulatory Visit: Payer: Self-pay | Admitting: Physician Assistant

## 2021-03-12 ENCOUNTER — Ambulatory Visit (INDEPENDENT_AMBULATORY_CARE_PROVIDER_SITE_OTHER): Payer: 59 | Admitting: Psychiatry

## 2021-03-12 DIAGNOSIS — F411 Generalized anxiety disorder: Secondary | ICD-10-CM
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
## 2021-04-17 ENCOUNTER — Ambulatory Visit (INDEPENDENT_AMBULATORY_CARE_PROVIDER_SITE_OTHER): Payer: 59 | Admitting: Psychiatry

## 2021-04-17 ENCOUNTER — Other Ambulatory Visit: Payer: Self-pay

## 2021-04-17 DIAGNOSIS — F411 Generalized anxiety disorder: Secondary | ICD-10-CM
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
## 2021-05-21 ENCOUNTER — Ambulatory Visit (INDEPENDENT_AMBULATORY_CARE_PROVIDER_SITE_OTHER): Payer: 59 | Admitting: Psychiatry

## 2021-05-21 ENCOUNTER — Other Ambulatory Visit: Payer: Self-pay

## 2021-05-21 DIAGNOSIS — F411 Generalized anxiety disorder: Secondary | ICD-10-CM

## 2021-05-21 NOTE — Progress Notes (Signed)
[facility] Counselor/Therapist Progress Note  Patient ID: Helen Greene, MRN: [id number],    Date: 05/21/2021  Time Spent: 50 minutes   Treatment Type: Individual Therapy  Reported Symptoms: anxiety, overthinking, difficulty with boundaries, some depression  Mental Status Exam:  Appearance:   Casual     Behavior:  Appropriate, Sharing, and Motivated (motivation some "ups and downs")  Motor:  Normal  Speech/Language:   Clear and Coherent  Affect:  Anxious, some depression  Mood:  anxious and depressed  Thought process:  goal directed  Thought content:    overthinking  Sensory/Perceptual disturbances:    WNL  Orientation:  oriented to person, place, time/date, situation, day of week, month of year, year, and stated date of Dec. 5, 2022  Attention:  Good  Concentration:  Good  Memory:  WNL  Fund of knowledge:   Good  Insight:    Fair  Judgment:   Good and Fair  Impulse Control:  Good and Fair   Risk Assessment: Danger to Self:  No Self-injurious Behavior: No Danger to Others: No Duty to Warn:no Physical Aggression / Violence:No  Access to Firearms a concern: No  Gang Involvement:No   Subjective:  Patient in today reporting anxiety, some depression, difficulty setting limits with room-mate (unhealthy relationship), financial stressors increase after moving from apt to small house. Today reporting anxiety, irritability, some depression, repressed anger, and stressed mostly regarding relationship with her family and with room-mate. Discussed these issues during session today, particularly how she can stop allowing herself to be used and then end up frustrated, depressed, anxious, and hurt. Shares that she is planning to speak with room-mate this week to discuss healthier boundaries and expectations. Some work-related stress that patient is managing fairly well. Showing some increased strength with relationships within work and family.   Interventions: Cognitive Behavioral  Therapy, Ego-Supportive, and Insight-Oriented  Treatment Goal Plan:  Patient not signing tx plan on computer screen due to COVID. Treatment Goals:Goals remain on tx plan as patient works on strategies to meet her goals. Progress will be documented each session in "Progress" section on plan. Long term goal: Develop the ability to recognize, accept, and cope with feelings of anxiety and  depression. Short term goal: Identify and replace anxious and depressive thinking that leads to anxious or depressive feelings and actions. Replace with more realistic and empowering thoughts.  Strategy: Reinforce patient's positive, reality based cognitive messages that enhance self-confidence and increase adaptive behaviors/action.   Diagnosis:   ICD-10-CM   1. Generalized anxiety disorder  F41.1      Plan:  Patient today showing motivation which she says can be "up and down" sometimes but "I do better with motivation at work". Participated actively in session today working on establishing healthier boundaries, setting clearer limits with people, anxious thinking patterns, difficulty with self-esteem, and fears of the unknown.  Overthinking and overanalyzing. Encouraged patient in her practice of positive behaviors including: Following through on steps discussed today regarding better self-care, believing in herself more and that she can make positive changes, remaining in the present focusing on what she can control, nurture some of the family relationships that seem to be improving, staying in touch with people who are supportive, practice deep breathing exercises to help with her anxiety, consistent positive self talk, being able to say no when she needs to say no, setting appropriate boundaries with others, get outside daily and walk some, healthy nutrition and physical movement, continue working on her self confidence  and feeling more positive about herself, replacing 1 negative thought with 2 positive  thoughts, being open to meeting new people and developing friendships that are healthy, and recognize her strength that she shows working with goal-directed behaviors to move in a direction that supports improved emotional health.   Goal review and progress/challenges noted with patient.  Next appointment within 1 month.  This record has been created using [employer].  Chart creation errors have been sought, but may not always have been located and corrected.  Such creation errors do not reflect on the standard of medical care provided.    Mathis Fare, LCSW

## 2021-06-11 ENCOUNTER — Other Ambulatory Visit: Payer: Self-pay | Admitting: Physician Assistant

## 2021-06-20 ENCOUNTER — Other Ambulatory Visit: Payer: Self-pay

## 2021-06-20 ENCOUNTER — Ambulatory Visit (INDEPENDENT_AMBULATORY_CARE_PROVIDER_SITE_OTHER): Payer: 59 | Admitting: Psychiatry

## 2021-06-20 DIAGNOSIS — F411 Generalized anxiety disorder: Secondary | ICD-10-CM

## 2021-06-20 NOTE — Progress Notes (Signed)
[facility] Counselor/Therapist Progress Note  Patient ID: MAI LONGNECKER, MRN: [id number],    Date: 06/20/2021  Time Spent: 55 minutes   Treatment Type: Individual Therapy  Reported Symptoms: anxiety, difficulty with some "down-ness" during the holidays  Mental Status Exam:  Appearance:   Casual     Behavior:  Appropriate and Sharing  Motor:  Normal  Speech/Language:   Clear and Coherent  Affect:  anxious  Mood:  anxious  Thought process:  goal directed  Thought content:    WNL  Sensory/Perceptual disturbances:    WNL  Orientation:  oriented to person, place, time/date, situation, day of week, month of year, year, and stated date of Jan. 4, 2023  Attention:  Good  Concentration:  Good  Memory:  WNL  Fund of knowledge:   Good  Insight:    Good and Fair  Judgment:   Good and Fair  Impulse Control:  Good   Risk Assessment: Danger to Self:  No Self-injurious Behavior: No Danger to Others: No Duty to Warn:no Physical Aggression / Violence:No  Access to Firearms a concern: No  Gang Involvement:No   Subjective: Patient in today reporting anxiety and some difficulty with "feelings of down-ness" during the holiday and "am better now."  Discussed more of the unhealthiness of a relationship that she reports she is making efforts to change and talked through some mixed feelings she is having. Not very connected to others at this point and is wanting some connections but has been hard to follow through.  Considering churches she may want to visit, as well as other ways to meet people. Financial stressors are not quite as challenging but do still exist. Irritability and anger have decreased some. Doing well at work and feels she is managing stress there better.  Encouraged to have healthier boundaries per our discussion today, as well as having realistic expectations of herself and others.   Interventions: Solution-Oriented/Positive Psychology and Insight-Oriented  Treatment Goal  Plan:  Patient not signing tx plan on computer screen due to COVID. Treatment Goals:Goals remain on tx plan as patient works on strategies to meet her goals. Progress will be documented each session in "Progress" section on plan. Long term goal: Develop the ability to recognize, accept, and cope with feelings of anxiety and  depression. Short term goal: Identify and replace anxious and depressive thinking that leads to anxious or depressive feelings and actions. Replace with more realistic and empowering thoughts.  Strategy: Reinforce patient's positive, reality based cognitive messages that enhance self-confidence and increase adaptive behaviors/action.  Diagnosis:   ICD-10-CM   1. Generalized anxiety disorder  F41.1      Plan:  Patient today showing motivation and good participation in session. Worked further on her anxiety, feelings of "down-ness" from the holidays, and dealing with an unhealthy relationship and very mixed feelings, but wanting to have healthier boundaries.  Discussed what she feels healthy boundaries would be like, and how she might become a person with healthier boundaries.  She was very interactive in this conversation and showed some good insight and also caught herself and "settling for less" in relationships.  Worked some with strategies in her treatment goal plan including identifying and replacing anxious/depressive/limiting thoughts with more realistic and empowering thoughts, which can also enhance her self-confidence and increase adaptive behaviors and actions and relationships.  Encouraged patient and her practice of more positive behaviors including: Following through on our discussions in sessions about better self-care, believing in herself more and  that she can make positive changes, remaining in the present focusing on what she can control versus cannot, nurture some of the family relationships that seem to be improving, staying in touch with people who are  supportive, using deep breathing exercises to help with her anxiety, consistent positive self talk, being able to say no when she needs to say no without guilt, setting appropriate boundaries with others, get outside daily as she is able to walk, healthy nutrition and physical movement, continue working on her self confidence and feeling more positive about herself, replacing 1 negative thought with 2 positive thoughts, being open to meeting new people and developing friendships that are healthy, and feel good about the strength she shows working with goal-directed behaviors to move in a direction that supports improved emotional health.  Goal review and progress/challenges noted with patient.  Next appointment within approx 1 month.  This record has been created using [employer].  Chart creation errors have been sought, but may not always have been located and corrected.  Such creation errors do not reflect on the standard of medical care provided.   Mathis Fare, LCSW

## 2021-06-28 ENCOUNTER — Other Ambulatory Visit: Payer: Self-pay | Admitting: Physician Assistant

## 2021-07-24 ENCOUNTER — Other Ambulatory Visit: Payer: Self-pay | Admitting: Physician Assistant

## 2021-07-27 ENCOUNTER — Ambulatory Visit (INDEPENDENT_AMBULATORY_CARE_PROVIDER_SITE_OTHER): Payer: 59 | Admitting: Physician Assistant

## 2021-07-27 ENCOUNTER — Other Ambulatory Visit: Payer: Self-pay

## 2021-07-27 ENCOUNTER — Encounter: Payer: Self-pay | Admitting: Physician Assistant

## 2021-07-27 DIAGNOSIS — F3342 Major depressive disorder, recurrent, in full remission: Secondary | ICD-10-CM

## 2021-07-27 DIAGNOSIS — F411 Generalized anxiety disorder: Secondary | ICD-10-CM

## 2021-07-27 DIAGNOSIS — G2581 Restless legs syndrome: Secondary | ICD-10-CM

## 2021-07-27 DIAGNOSIS — E559 Vitamin D deficiency, unspecified: Secondary | ICD-10-CM | POA: Diagnosis not present

## 2021-07-27 MED ORDER — ARIPIPRAZOLE 2 MG PO TABS: 2.0000 mg | ORAL_TABLET | Freq: Every morning | ORAL | 3 refills | Status: DC

## 2021-07-27 MED ORDER — PRAMIPEXOLE DIHYDROCHLORIDE 0.25 MG PO TABS: 0.2500 mg | ORAL_TABLET | Freq: Every day | ORAL | 3 refills | Status: DC

## 2021-07-27 MED ORDER — ESCITALOPRAM OXALATE 20 MG PO TABS: 20.0000 mg | ORAL_TABLET | Freq: Every day | ORAL | 3 refills | Status: DC

## 2021-07-27 NOTE — Progress Notes (Signed)
[facility] Med Check  Patient ID: Helen Greene,  MRN: [id number]  PCP: Doristine Bosworth, MD  Date of Evaluation: 01/24/2021 Time spent:40 minutes  Chief Complaint:  Chief Complaint   Anxiety; Depression; Follow-up     HISTORY/CURRENT STATUS:  For routine med check.  States she is doing really well.  Feels like her medications are still effective.  She is able to enjoy things.  Energy and motivation are good.  She is able to work without difficulty. Not isolating.  Does not cry easily.  She sleeps well most of the time.  Rarely has anxiety.  She has the Ativan to take if needed but has not been needing it.  Working from home has really helped her a lot.  No suicidal or homicidal thoughts.  Patient denies increased energy with decreased need for sleep, no increased talkativeness, no racing thoughts, no impulsivity or risky behaviors, no increased spending, no increased libido, no grandiosity, no increased irritability or anger, and no hallucinations.  Denies dizziness, syncope, seizures, numbness, tingling, tremor, tics, unsteady gait, slurred speech, confusion. Denies muscle or joint pain, stiffness, or dystonia.  Individual Medical History/ Review of Systems: Changes? :Yes    Had a kidney stone, went to ER. Unsure if it passed or not. But no more pain. Also had covid last month.   Past medications for mental health diagnoses include: Wellbutrin XL, Risperdal caused edema, Lexapro, Mirapex, Vistaril, questionably Xanax, Abilify, Lamictal, Klonopin, pramipexole for restless legs    Allergies: Bee venom, Enablex [darifenacin hydrobromide], Erythromycin, Macrobid [nitrofurantoin monohyd macro], Metronidazole, Nitrofuran derivatives, and Risperidone and related  Current Medications:  Current Outpatient Medications:    buPROPion (WELLBUTRIN XL) 300 MG 24 hr tablet, TAKE 1 TABLET EVERY MORNING, Disp: 90 tablet, Rfl: 3   Cholecalciferol (VITAMIN D) 50 MCG (2000 UT) CAPS, Take 2  capsules (4,000 Units total) by mouth daily., Disp: 30 capsule, Rfl: 11   LORazepam (ATIVAN) 0.5 MG tablet, Take 1 tablet (0.5 mg total) by mouth every 8 (eight) hours as needed for anxiety., Disp: 90 tablet, Rfl: 1   omeprazole (PRILOSEC) 20 MG capsule, Take 1 capsule (20 mg total) by mouth daily., Disp: 90 capsule, Rfl: 3   valACYclovir (VALTREX) 500 MG tablet, Take 1 tablet (500 mg total) by mouth 2 (two) times daily. For 3-5 days during outbreak, Disp: 30 tablet, Rfl: 5   ARIPiprazole (ABILIFY) 2 MG tablet, Take 1 tablet (2 mg total) by mouth every morning., Disp: 90 tablet, Rfl: 3   atorvastatin (LIPITOR) 10 MG tablet, Take 1 tablet (10 mg total) by mouth every other day. (Patient not taking: Reported on 07/27/2021), Disp: 45 tablet, Rfl: 3   escitalopram (LEXAPRO) 20 MG tablet, Take 1 tablet (20 mg total) by mouth daily., Disp: 90 tablet, Rfl: 3   Ferrous Sulfate (IRON PO), Take by mouth.  (Patient not taking: Reported on 01/24/2021), Disp: , Rfl:    lidocaine (XYLOCAINE) 5 % ointment, Apply 1 application topically 3 (three) times daily. (Patient not taking: Reported on 01/24/2021), Disp: 1.25 g, Rfl: 0   metFORMIN (GLUCOPHAGE) 500 MG tablet, Take 500 mg by mouth 2 (two) times daily. (Patient not taking: Reported on 01/24/2021), Disp: , Rfl:    metroNIDAZOLE (METROGEL) 0.75 % vaginal gel, Use 1 applicator full vaginally hs x 5 nights (Patient not taking: Reported on 01/24/2021), Disp: 70 g, Rfl: 1   ondansetron (ZOFRAN ODT) 4 MG disintegrating tablet, Take 1 tablet (4 mg total) by mouth every 8 (eight) hours as needed  for nausea or vomiting. (Patient not taking: Reported on 01/24/2021), Disp: 20 tablet, Rfl: 0   pramipexole (MIRAPEX) 0.25 MG tablet, Take 1 tablet (0.25 mg total) by mouth at bedtime., Disp: 90 tablet, Rfl: 3   telmisartan-hydrochlorothiazide (MICARDIS HCT) 40-12.5 MG tablet, TAKE 1 TABLET DAILY (Patient not taking: Reported on 07/27/2021), Disp: 30 tablet, Rfl: 0 Medication Side Effects:  none  Family Medical/ Social History: Changes?   She bought a new home and moved.  MENTAL HEALTH EXAM:  Last menstrual period 09/07/2015.There is no height or weight on file to calculate BMI.  General Appearance: Casual, Neat, Well Groomed and Obese  Eye Contact:  Good  Speech:  Clear and Coherent and Normal Rate  Volume:  Normal  Mood:  Euthymic  Affect:  Appropriate  Thought Process:  Goal Directed and Descriptions of Associations: Intact  Orientation:  Full (Time, Place, and Person)  Thought Content: Logical   Suicidal Thoughts:  No  Homicidal Thoughts:  No  Memory:  WNL  Judgement:  Good  Insight:  Good  Psychomotor Activity:  Normal  Concentration:  Concentration: Good and Attention Span: Good  Recall:  Good  Fund of Knowledge: Good  Language: Good  Assets:  Desire for Improvement  ADL's:  Intact  Cognition: WNL  Prognosis:  Good   Labs  01/24/21 Vitamin D 60.5  DIAGNOSES:    ICD-10-CM   1. Recurrent major depressive disorder, in full remission (HCC)  F33.42     2. Restless leg syndrome  G25.81     3. Generalized anxiety disorder  F41.1     4. Vitamin D deficiency  E55.9         Receiving Psychotherapy: Yes With Rockne Menghini, LCSW.   RECOMMENDATIONS:  PDMP was reviewed.  Lorazepam filled 01/30/2021. I provided 20 minutes of face to face time during this encounter, including time spent before and after the visit in records review, medical decision making, counseling pertinent to today's visit, and charting.  Doing well as far as mental health meds go so no changes.  Continue Abilify 2 mg every morning. Continue Wellbutrin XL 300 mg, 1 p.o. every morning. Continue Lexapro 20 mg daily. Continue Ativan 0.5 mg every 8 hours as needed anxiety. Continue pramipexole 0.25 mg nightly. Continue vitamin D 2000 IUs, 2 p.o. daily. Continue therapy with Rockne Menghini, LCSW. return in 6 months.    Melony Overly, PA-C

## 2021-08-27 ENCOUNTER — Ambulatory Visit (INDEPENDENT_AMBULATORY_CARE_PROVIDER_SITE_OTHER): Payer: 59 | Admitting: Psychiatry

## 2021-08-27 ENCOUNTER — Other Ambulatory Visit: Payer: Self-pay

## 2021-08-27 DIAGNOSIS — F3342 Major depressive disorder, recurrent, in full remission: Secondary | ICD-10-CM

## 2021-08-27 NOTE — Progress Notes (Signed)
?    [facility] Counselor/Therapist Progress Note ? ?Patient ID: Helen Greene, MRN: [id number],   ? ?Date: 08/27/2021 ? ?Time Spent: 55 minutes  ? ?Treatment Type: Individual Therapy ? ?Reported Symptoms: anxiety, irritable ? ?Mental Status Exam: ? ?Appearance:   Casual     ?Behavior:  Appropriate, Sharing, and Motivated  ?Motor:  Normal  ?Speech/Language:   Negative  ?Affect:  Anxious, irritable   ?Mood:  anxious and irritable  ?Thought process:  goal directed  ?Thought content:    WNL  ?Sensory/Perceptual disturbances:    WNL  ?Orientation:  oriented to person, place, time/date, situation, day of week, month of year, year, and stated date of August 27, 2021  ?Attention:  Good  ?Concentration:  Good and Fair  ?Memory:  WNL  ?Fund of knowledge:   Good  ?Insight:    Good and Fair  ?Judgment:   Good  ?Impulse Control:  Good and Fair  ? ?Risk Assessment: ?Danger to Self:  No ?Self-injurious Behavior: No ?Danger to Others: No ?Duty to Warn:no ?Physical Aggression / Violence:No  ?Access to Firearms a concern: No  ?Gang Involvement:No  ? ?Subjective:   Patient today reporting anxiety and irritability, some tearfulness due to personal, work, and live-in friend situation has worsened. Frustrated, irritable, some tearfulness, related to her difficulty making better decisions especially regarding her friend that lives in her home. Very frustrated with friend and with herself as she is finding it very difficult to set healthy boundaries that will be respected. Processed this at length today and she is trying to figure out how she can best communicate some boundaries and shared responsibilities with room-mate or may decide she needs for room-mate to have a different option other that patient's home.  Did look at some options in talking with room-mate in a way that she may be better heard, as we discussed more on patient's needs (as discussed in session) and some communications strategies that could be helpful to her.   Encouraged to stay in touch with friends and family members that are supportive of her and to think more seriously about what kind of limits and boundaries she needs to set with others in order for living arrangements to feel better to her.  Work also has been very stressful but she feels she is managing it as well as she can. ? ?Interventions: Ego-Supportive and Insight-Oriented ? ?Treatment Goal Plan:  ?Patient not signing tx plan on computer screen due to COVID. ?Treatment Goals:Goals remain on tx plan as patient works on strategies ?to meet her goals. Progress will be documented each session in "Progress" section on plan. ?Long term goal: ?Develop the ability to recognize, accept, and cope with feelings of anxiety and  depression. ?Short term goal: ?Identify and replace anxious and depressive thinking that leads to anxious or depressive feelings and actions. Replace with more realistic and empowering thoughts.  ?Strategy: ?Reinforce patient's positive, reality based cognitive messages that enhance self-confidence and increase adaptive behaviors/action. ?  ?Diagnosis: ?  ICD-10-CM   ?1. Recurrent major depressive disorder, in full remission (HCC)  F33.42   ?  ? ?Plan:  Patient today showing motivation and active participation in session as she worked further on her personal and work stressors and issues regarding a live-in roommate which she focused on primarily today.  Discussed boundary issues and what limitations she may want to set and need to set in order to feel that the situation is healthier, as she owns the home where she is  living.  Very active in her discussion, showing some good insight but also getting caught up in some of her fear of having to set limits with others and possibly getting her emotionally in the meantime.  Has a history of it being difficult for her to set limits with other people which we discussed at length today.  Discussed some strategies for limit setting that she might could be  comfortable with and encouraged her to work with some of the anxious thoughts that arise and also look at the other side of benefits that she might experience if she were able to have better boundaries with other people. Encouraged patient in her practice of more positive behaviors including: Remaining in the present focusing on what she can control versus cannot, following through on our discussion in sessions about better self-care, believing in herself more and that she can make positive changes, nurture some of the family relationships that seem to be improving, staying in touch with people who are supportive, using deep breathing exercises to help with her anxiety as needed, consistent positive self talk, being able to say no when she needs to say no without guilt, setting appropriate boundaries with others, get outside daily as she is able to walk, healthy nutrition and physical movement, continue working on her self confidence and feeling more positive about herself, for every negative thought replace with 2 positives, being open to meeting new people and developing relationships that are healthy for her, and realize the strength she shows working with goal-directed behaviors to move in a direction that supports her improved emotional health and overall wellbeing. ? ?Goal review and progress/challenges noted with patient. ? ?Next appointment within 4-5 weeks. ? ?This record has been created using [employer].  Chart creation errors have been sought, but may not always have been located and corrected.  Such creation errors do not reflect on the standard of medical care provided. ? ? ?Shanon Ace, LCSW ? ? ? ? ? ? ? ? ? ? ? ? ? ? ? ? ? ? ?

## 2021-09-13 ENCOUNTER — Ambulatory Visit (INDEPENDENT_AMBULATORY_CARE_PROVIDER_SITE_OTHER): Payer: Managed Care, Other (non HMO) | Admitting: Radiology

## 2021-09-13 VITALS — BP 138/96

## 2021-09-13 DIAGNOSIS — B9689 Other specified bacterial agents as the cause of diseases classified elsewhere: Secondary | ICD-10-CM

## 2021-09-13 DIAGNOSIS — N760 Acute vaginitis: Secondary | ICD-10-CM

## 2021-09-13 DIAGNOSIS — K130 Diseases of lips: Secondary | ICD-10-CM

## 2021-09-13 LAB — WET PREP FOR TRICH, YEAST, CLUE

## 2021-09-13 MED ORDER — CLINDAMYCIN PHOSPHATE 2 % VA CREA: 1.0000 | TOPICAL_CREAM | Freq: Every day | VAGINAL | 0 refills | Status: DC

## 2021-09-13 MED ORDER — NYSTATIN-TRIAMCINOLONE 100000-0.1 UNIT/GM-% EX OINT: 1.0000 "application " | TOPICAL_OINTMENT | Freq: Two times a day (BID) | CUTANEOUS | 3 refills | Status: DC

## 2021-09-13 NOTE — Progress Notes (Signed)
? ? ? ? ?  Subjective: Helen Greene is a 51 y.o. female who complains of vaginal itching, no discharge, no odor, redness in crease of legs, under belly and under breasts---used powder nystatin a few times (x's 2 weeks) ? ?Review of Systems negative except  ? ?Objective: ? ?-Vulva: without lesions or discharge ?-Vagina: discharge present, aptima swab and wet prep obtained ?-Cervix: no lesion or discharge, no CMT ?-Perineum: no lesions ?-Uterus: Mobile, non tender ?-Adnexa: no masses or tenderness ? ? ?Microscopic wet-mount exam shows clue cells.  ? ?Chaperone offered and declined. ? ?Assessment:/Plan:  ? ?1. Acute vaginitis ? ?- WET PREP FOR TRICH, YEAST, CLUE ? ?2. Intertrigo labialis ?Nystatin/triamcinolone BID ? ?3. BV (bacterial vaginosis) ?Cleocin cream   ?  ? ?Will contact patient with results of testing completed today. Avoid intercourse until symptoms are resolved. Safe sex encouraged. Avoid the use of soaps or perfumed products in the peri area. Avoid tub baths and sitting in sweaty or wet clothing for prolonged periods of time.   ?

## 2021-10-10 ENCOUNTER — Ambulatory Visit (INDEPENDENT_AMBULATORY_CARE_PROVIDER_SITE_OTHER): Payer: 59 | Admitting: Psychiatry

## 2021-10-10 DIAGNOSIS — F411 Generalized anxiety disorder: Secondary | ICD-10-CM | POA: Diagnosis not present

## 2021-10-10 NOTE — Progress Notes (Signed)
?    [facility] Counselor/Therapist Progress Note ? ?Patient ID: Helen Greene, MRN: [id number],   ? ?Date: 10/10/2021 ? ?Time Spent: 58 minutes  ? ?Treatment Type: Individual Therapy ? ?Reported Symptoms: anxiety, stressed working in different area ? ?Mental Status Exam: ? ?Appearance:   Casual     ?Behavior:  Appropriate, Sharing, and Motivated  ?Motor:  Normal  ?Speech/Language:   Clear and Coherent  ?Affect:  anxious  ?Mood:  anxious  ?Thought process:  goal directed  ?Thought content:    overthinking  ?Sensory/Perceptual disturbances:    WNL  ?Orientation:  oriented to person, place, time/date, situation, day of week, month of year, year, and stated date of April 2023  ?Attention:  Good  ?Concentration:  Fair  ?Memory:  WNL  ?Fund of knowledge:   Good  ?Insight:    Good and Fair  ?Judgment:   Good  ?Impulse Control:  Good  ? ?Risk Assessment: ?Danger to Self:  No ?Self-injurious Behavior: No ?Danger to Others: No ?Duty to Warn:no ?Physical Aggression / Violence:No  ?Access to Firearms a concern: No  ?Gang Involvement:No  ? ?Subjective:  ?Patient today reporting anxiety and feeling very stressed regarding new job and also re: her room-mate. Started a weight loss program through [facility] and checks in weekly with them. Has lost 22 lbs so far, and feeling better about herself physically. 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 Except for her job and room-mate, "I'm not as stressed."  Processed her job stressors more and ways that she can be taking better care of herself in the midst of so much stress on the job.  Trying to set limits and expectations of person living with her has not been effective. Processed this further and patient feels she is working on becoming more assertive and not feeling badly when she needs to set limits with people versus letting  them use her financially.  Has tended to feel badly whenever she has tried setting limits with people previously but admits that she thinks she would feel better just living with herself than having her roommate and plans to continue working towards communicating this to her current roommate.  Does enjoy having time with her adult son and his young family and her other adult son who is getting married this year. ? ?Interventions: Solution-Oriented/Positive Psychology, Ego-Supportive, and Insight-Oriented ? ?Treatment Goal Plan:  ?Patient not signing tx plan on computer screen due to [place]. ?Treatment Goals:Goals remain on tx plan as patient works on strategies ?to meet her goals. Progress will be documented each session in "Progress" section on plan. ?Long term goal: ?Develop the ability to recognize, accept, and cope with feelings of anxiety and  depression. ?Short term goal: ?Identify and replace anxious and depressive thinking that leads to anxious or depressive feelings and actions. Replace with more realistic and empowering thoughts.  ?Strategy: ?Reinforce patient's positive, reality based cognitive messages that enhance self-confidence and increase adaptive behaviors/action. ?  ?Diagnosis: ?  ICD-10-CM   ?1. Generalized anxiety disorder  F41.1   ?  ? ?Plan: Patient showing good motivation and participation in session as she focused on her difficulty in setting appropriate limits and boundaries with others, especially others where she has felt used by them repeatedly.  As noted above, processed this in session today and patient seems to feel that she  is getting stronger in the area of setting boundaries however has not done so with a roommate.  She does have a trip coming up to visit her brother out of state and plans to talk with a roommate when she returns. Encouraged patient in her practicing more positive behaviors including: Believing in herself more that she can make positive changes, nurture some of the  family relationships that seem to be improving, remain in the present focusing what she can control versus cannot, following through on our discussion in sessions about better self-care, staying in touch with people who are supportive, using deep breathing exercises to help with her anxiety as needed, consistent positive self talk, being able to say no when she needs to say no without guilt, setting appropriate boundaries with others, get outside daily as she is able to walk, healthy nutrition and physical movement, continue working on her self confidence and feeling more positive about herself, being open to meeting new people and developing relationships that are healthy for her, and recognize the strength she shows when working with goal-directed behaviors to move in a direction that supports her improved emotional health. ? ?Goal review and progress/challenges noted with patient. ? ?Next appointment within 4 to 5 weeks. ? ?This record has been created using [employer].  Chart creation errors have been sought, but may not always have been located and corrected.  Such creation errors do not reflect on the standard of medical care provided. ? ? ?Shanon Ace, LCSW ? ? ? ? ? ? ? ? ? ? ? ? ? ? ? ? ? ? ?

## 2021-11-14 ENCOUNTER — Ambulatory Visit (INDEPENDENT_AMBULATORY_CARE_PROVIDER_SITE_OTHER): Payer: 59 | Admitting: Psychiatry

## 2021-11-14 DIAGNOSIS — F411 Generalized anxiety disorder: Secondary | ICD-10-CM

## 2021-11-14 NOTE — Progress Notes (Signed)
[facility] Counselor/Therapist Progress Note  Patient ID: Helen Greene, MRN: [id number],    Date: 11/14/2021  Time Spent: 58 minutes   Treatment Type: Individual Therapy  Reported Symptoms: anxiety, depression, stressed (especially at work)  Mental Status Exam:  Appearance:   Casual     Behavior:  Appropriate, Sharing, and Motivated  Motor:  Normal  Speech/Language:   Clear and Coherent  Affect:  Depressed and anxious  Mood:  anxious and depressed  Thought process:  goal directed  Thought content:    WNL  Sensory/Perceptual disturbances:    WNL  Orientation:  oriented to person, place, time/date, situation, day of week, month of year, year, and stated date of Nov 14, 2021  Attention:  Good  Concentration:  Good and Fair  Memory:  WNL  Fund of knowledge:   Good  Insight:    Good and Fair  Judgment:   Good  Impulse Control:  Good   Risk Assessment: Danger to Self:  No Self-injurious Behavior: No Danger to Others: No Duty to Warn:no Physical Aggression / Violence:No  Access to Firearms a concern: No  Gang Involvement:No   Subjective: Patient in today reporting anxiety and depression with anxiety being the stronger symptom. Anxiety is mostly related to work and fears of not making her goals and "getting written up."  Shares and processes her difficulties trying to adjust to newer job responsibilities and feels she can't learn it. Really down on herself and assuming all is going in negative direction and that "I'll get written up and lose my job". Intimidated at job. Supervisor has been very encouraging to patient but patient still struggling a lot emotionally. (Not all details included in this note due to patient privacy needs.) Worked on some positive behaviors and beliefs in herself that may help her change her outlook on the job. To try more diligently at work using a more positive frame of mind which we discussed in session, realizing she may get a different outcome if  she practices believing in herself more, and she agrees. Worked with some self-motivation and more self-acceptance which is a challenge for patient. Has continued weight loss program and lost 34 lbs in recent months.   Interventions: Solution-Oriented/Positive Psychology, Ego-Supportive, and Insight-Oriented  Treatment Goal Plan:  Patient not signing tx plan on computer screen due to COVID. Treatment Goals:Goals remain on tx plan as patient works on strategies to meet her goals. Progress will be documented each session in "Progress" section on plan. Long term goal: Develop the ability to recognize, accept, and cope with feelings of anxiety and  depression. Short term goal: Identify and replace anxious and depressive thinking that leads to anxious or depressive feelings and actions. Replace with more realistic and empowering thoughts.  Strategy: Reinforce patient's positive, reality based cognitive messages that enhance self-confidence and increase adaptive behaviors/action.  Diagnosis:   ICD-10-CM   1. Generalized anxiety disorder  F41.1      Plan:  Patient showing good participation and motivation today as she worked on her anxiety on her job which is very troubling for patient and she is fearful of eventually getting written up and possibly losing her job.  Admits that she is looking for more for what might go wrong versus right, is discounting herself and her abilities, is "overly stressing to the point is affecting her performance and interrupts her train of thought".  Focused on this more today and worked with helping her develop more positive belief in  herself to the point of being able to imagine herself being successful and at least give it her best shot in the newer position versus giving up prematurely.  She also is aware that her supervisor seems to really feel good about her and if she sees her really trying, then that would go better for patient than if they feel like she is not  really putting forth much effort to try and do the job effectively.  Patient did seem to feel a little more empowered upon leaving today, smiling more, and seemed to have a little more hope about her situation and also realizing several things that she needs to work on changing in order to be her best on the job and sincerely wants to do that. Encouraged patient to practice more positive behaviors as discussed in session including: Believing in herself more that she can continue to make positive changes, nurture some of the family relationships that seem to be improving, remain in the present focusing on what she can control versus cannot, following through on our discussion in sessions about better self-care, staying in touch with people who are supportive, using deep breathing exercises to help with her anxiety as needed, consistent positive self talk, being able to say no when she needs to say no without guilt, setting appropriate boundaries with others, get outside daily as she is able to walk, healthy nutrition and physical movement, continue working on her self confidence and feeling more positive about herself, being more open to meeting new people and developing relationships that are healthy for her, and realize the strength she shows when working with goal directed behaviors to move in a direction that supports her improved emotional health and overall outlook.  Goal review and progress/challenges noted with patient.  Next appointment within 3 to 4 weeks.  This record has been created using [employer].  Chart creation errors have been sought, but may not always have been located and corrected.  Such creation errors do not reflect on the standard of medical care provided.  Mathis Fare, LCSW

## 2021-11-26 ENCOUNTER — Other Ambulatory Visit: Payer: Self-pay | Admitting: Obstetrics & Gynecology

## 2021-11-26 ENCOUNTER — Other Ambulatory Visit (HOSPITAL_COMMUNITY)
Admission: RE | Admit: 2021-11-26 | Discharge: 2021-11-26 | Disposition: A | Discharge status: Home / Self Care | Payer: 59 | Source: Ambulatory Visit | Attending: Obstetrics & Gynecology | Admitting: Obstetrics & Gynecology

## 2021-11-26 ENCOUNTER — Encounter: Payer: Self-pay | Admitting: Obstetrics & Gynecology

## 2021-11-26 ENCOUNTER — Ambulatory Visit (INDEPENDENT_AMBULATORY_CARE_PROVIDER_SITE_OTHER): Payer: 59 | Admitting: Obstetrics & Gynecology

## 2021-11-26 VITALS — BP 136/86 | HR 78 | Resp 12 | Ht 59.75 in | Wt 206.0 lb

## 2021-11-26 DIAGNOSIS — Z113 Encounter for screening for infections with a predominantly sexual mode of transmission: Secondary | ICD-10-CM

## 2021-11-26 DIAGNOSIS — Z1231 Encounter for screening mammogram for malignant neoplasm of breast: Secondary | ICD-10-CM

## 2021-11-26 DIAGNOSIS — Z01419 Encounter for gynecological examination (general) (routine) without abnormal findings: Secondary | ICD-10-CM | POA: Diagnosis not present

## 2021-11-26 DIAGNOSIS — B3731 Acute candidiasis of vulva and vagina: Secondary | ICD-10-CM | POA: Diagnosis not present

## 2021-11-26 DIAGNOSIS — N951 Menopausal and female climacteric states: Secondary | ICD-10-CM

## 2021-11-26 DIAGNOSIS — Z1272 Encounter for screening for malignant neoplasm of vagina: Secondary | ICD-10-CM | POA: Insufficient documentation

## 2021-11-26 DIAGNOSIS — Z6841 Body Mass Index (BMI) 40.0 and over, adult: Secondary | ICD-10-CM

## 2021-11-26 MED ORDER — FLUCONAZOLE 150 MG PO TABS: 150.0000 mg | ORAL_TABLET | ORAL | 0 refills | Status: DC

## 2021-11-26 NOTE — Progress Notes (Signed)
Helen Greene [birth year] [id number]   History:    51 y.o.  [id number] Divorced   RP:  Established patient presenting for annual gyn exam    HPI: S/P Total Hysterectomy with unilateral Oophorectomy.  C/O night sweats.  No pelvic pain.  C/O a yeasty vaginal d/c with itching.  Occasionally sexually active.  Pap Neg 02/2020.  Pap reflex today with HR HPV, Gono-Chlam. Urine/BMs normal.  Breasts Normal.  Mammo Neg 02/07/21.  Repeat Mammo scheduled in 01/2022. Successfully loosing weight with the [place] weight loss program.  BMI 40.57. Health labs with Fam MD.  Alen Bleacher many years ago, needs to schedule now.   Past medical history,surgical history, family history and social history were all reviewed and documented in the EPIC chart.  Gynecologic History Patient's last menstrual period was 09/07/2015.  Obstetric History OB History  Gravida Para Term Preterm AB Living  3 2     1 2   SAB IAB Ectopic Multiple Live Births      1        # Outcome Date GA Lbr Len/2nd Weight Sex Delivery Anes PTL Lv  3 Ectopic           2 Para           1 Para              ROS: A ROS was performed and pertinent positives and negatives are included in the history. GENERAL: No fevers or chills. HEENT: No change in vision, no earache, sore throat or sinus congestion. NECK: No pain or stiffness. CARDIOVASCULAR: No chest pain or pressure. No palpitations. PULMONARY: No shortness of breath, cough or wheeze. GASTROINTESTINAL: No abdominal pain, nausea, vomiting or diarrhea, melena or bright red blood per rectum. GENITOURINARY: No urinary frequency, urgency, hesitancy or dysuria. MUSCULOSKELETAL: No joint or muscle pain, no back pain, no recent trauma. DERMATOLOGIC: No rash, no itching, no lesions. ENDOCRINE: No polyuria, polydipsia, no heat or cold intolerance. No recent change in weight. HEMATOLOGICAL: No anemia or easy bruising or bleeding. NEUROLOGIC: No headache, seizures, numbness, tingling or weakness. PSYCHIATRIC: No  depression, no loss of interest in normal activity or change in sleep pattern.     Exam:   BP 136/86 (BP Location: Left Arm, Patient Position: Sitting, Cuff Size: Normal)   Pulse 78   Resp 12   Ht 4' 11.75" (1.518 m)   Wt 206 lb (93.4 kg)   LMP 09/07/2015   BMI 40.57 kg/m   Body mass index is 40.57 kg/m.  General appearance : Well developed well nourished female. No acute distress HEENT: Eyes: no retinal hemorrhage or exudates,  Neck supple, trachea midline, no carotid bruits, no thyroidmegaly Lungs: Clear to auscultation, no rhonchi or wheezes, or rib retractions  Heart: Regular rate and rhythm, no murmurs or gallops Breast:Examined in sitting and supine position were symmetrical in appearance, no palpable masses or tenderness,  no skin retraction, no nipple inversion, no nipple discharge, no skin discoloration, no axillary or supraclavicular lymphadenopathy Abdomen: no palpable masses or tenderness, no rebound or guarding Extremities: no edema or skin discoloration or tenderness  Pelvic: Vulva: Normal             Vagina: No gross lesions.  Yeasty vaginal d/c.  Pap/HPV HR, Gono-Chlam done.  Cervix/Uterus absent  Adnexa  Without masses or tenderness  Anus: Normal   Assessment/Plan:  51 y.o. female for annual exam   1. Encounter for Papanicolaou smear of vagina as part of routine gynecological  examination S/P Total Hysterectomy with unilateral Oophorectomy.  C/O night sweats.  No pelvic pain.  C/O a yeasty vaginal d/c with itching.  Occasionally sexually active.  Pap Neg 02/2020.  Pap reflex today with HR HPV, Gono-Chlam. Urine/BMs normal.  Breasts Normal.  Mammo Neg 02/07/21.  Repeat Mammo scheduled in 01/2022. Successfully loosing weight with the [place] weight loss program.  BMI 40.57. Health labs with Fam MD.  Alen Bleacher many years ago, needs to schedule now. - Cytology - PAP( [place])  2. Perimenopause S/P Total Hysterectomy with unilateral Oophorectomy.  C/O night sweats.   No pelvic pain.  Will check FSH today.  Declines HRT at this time. - FSH  3. Yeast vaginitis Yeasty vaginal discharge with vaginal itching.  Will treat with Fluconazole.  No CI.  Usage reviewed and prescription sent to pharmacy. - fluconazole (DIFLUCAN) 150 MG tablet; Take 1 tablet (150 mg total) by mouth every other day for 3 doses.   4. Screen for STD (sexually transmitted disease) Condoms strongly recommended. - HIV antibody (with reflex) - RPR - Hepatitis B Surface AntiGEN - Hepatitis C Antibody - Cytology - PAP( [place]) HR HPV, Gono-Chlam  5. Morbid obesity with BMI of 40.0-44.9, adult (HCC)  Continue with [place] weight loss program.  Counseling and management of yeast vaginitis, perimenopause and menopause for 15 minutes.  Genia Del MD, 1:44 PM 11/26/2021

## 2021-11-27 ENCOUNTER — Telehealth: Payer: Self-pay

## 2021-11-27 ENCOUNTER — Other Ambulatory Visit: Payer: Self-pay

## 2021-11-27 LAB — CYTOLOGY - PAP
Chlamydia: NEGATIVE
Comment: NEGATIVE
Comment: NORMAL
Diagnosis: NEGATIVE
Neisseria Gonorrhea: NEGATIVE

## 2021-11-27 LAB — FOLLICLE STIMULATING HORMONE: FSH: 102.9 m[IU]/mL

## 2021-11-27 LAB — HEPATITIS C ANTIBODY
Hepatitis C Ab: NONREACTIVE
SIGNAL TO CUT-OFF: 0.09 (ref <1.00)

## 2021-11-27 LAB — HEPATITIS B SURFACE ANTIGEN: Hepatitis B Surface Ag: NONREACTIVE

## 2021-11-27 LAB — RPR: RPR Ser Ql: NONREACTIVE

## 2021-11-27 LAB — HIV ANTIBODY (ROUTINE TESTING W REFLEX): HIV 1&2 Ab, 4th Generation: NONREACTIVE

## 2021-11-27 MED ORDER — FLUCONAZOLE 150 MG PO TABS: 150.0000 mg | ORAL_TABLET | ORAL | 0 refills | Status: AC

## 2021-11-27 NOTE — Telephone Encounter (Signed)
Patient called because Dr. Mackey Birchwood was to prescribed Rx for Diflucan at visit yesterday but she has not received notice from her pharmacy. Upon review the Rx was sent to [facility]. I called and tried to cancel it but was told patient does not have service with them so not received.  I confirmed with patient that she no longer is a member of that pharmacy. I reordered the Rx to her [facility] on [address] as she requested.

## 2021-12-12 ENCOUNTER — Ambulatory Visit (INDEPENDENT_AMBULATORY_CARE_PROVIDER_SITE_OTHER): Payer: 59 | Admitting: Psychiatry

## 2021-12-12 DIAGNOSIS — F411 Generalized anxiety disorder: Secondary | ICD-10-CM | POA: Diagnosis not present

## 2021-12-12 NOTE — Progress Notes (Signed)
[facility] Counselor/Therapist Progress Note  Patient ID: Helen Greene, MRN: [id number],    Date: 12/12/2021  Time Spent: 55 minutes   Treatment Type: Individual Therapy  Reported Symptoms: anxiety, financial stress, difficulty setting appropriate boundaries  Mental Status Exam:  Appearance:   Casual     Behavior:  Appropriate, Sharing, and Motivated  Motor:  Normal  Speech/Language:   Clear and Coherent  Affect:  anxious  Mood:  anxious  Thought process:  goal directed  Thought content:    normal  Sensory/Perceptual disturbances:    WNL  Orientation:  oriented to person, place, time/date, situation, day of week, month of year, year, and stated date of December 12, 2021  Attention:  Good  Concentration:  Good  Memory:  WNL  Fund of knowledge:   Good  Insight:    Good and Fair  Judgment:   Good/Fair  Impulse Control:  Good and Fair   Risk Assessment: Danger to Self:  No Self-injurious Behavior: No Danger to Others: No Duty to Warn:no Physical Aggression / Violence:No  Access to Firearms a concern: No  Gang Involvement:No   Subjective:  Patient in today reporting anxiety, financial stressors, and difficulty setting healthy boundaries which has worsened. Focused more today on the unhealthy boundaries, talking through situations where she is currently having a lot of boundary concerns and looking at why she struggles in setting healthy boundaries. Fears change at times and shared/processed fearful thoughts relating to "being alone".  To do some homework on this between session and will follow up next visit. Has become a little more comfortable with her job but thinking she may have a chance of a transfer to a different position within her same company.  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  States she has been trying more consistently to have a positive frame of mind at work and beyond.   Self-acceptance seems better.  Younger son is getting married in November of this year and looking forward to that.  Patient is part of a medical weight loss program and has now lost a total of approximately 40 pounds which she feels good about.   Interventions: Solution-Oriented/Positive Psychology, Ego-Supportive, and Insight-Oriented  Treatment Goals:Goals remain on tx plan as patient works on strategies to meet her goals. Progress will be documented each session in "Progress" section on plan. Long term goal: Develop the ability to recognize, accept, and cope with feelings of anxiety and  depression. Short term goal: Identify and replace anxious and depressive thinking that leads to anxious or depressive feelings and actions. Replace with more realistic and empowering thoughts.  Strategy: Reinforce patient's positive, reality based cognitive messages that enhance self-confidence and increase adaptive behaviors/action.  Diagnosis:   ICD-10-CM   1. Generalized anxiety disorder  F41.1      Plan: Patient today showing good motivation and participation in session focusing on her anxiety, financial stressors, and difficulty setting healthy boundaries.  She has been resistant to setting healthy boundaries previously but now sees that is creating more financial stress for her and is working on "my encourage to set better boundaries".  Discussed this more in session and she is to do some homework between sessions.  Trying to look more for what might go right versus wrong, and developing more belief in herself to make positive changes.  Feels that she is not "overly stressing" quite as much except  for the financial stressors and she is facing them rather than "putting it off".  Seemed to leave session feeling more empowered and some more confidence in herself. Encouraged patient in her practice of more positive behaviors as discussed in session including: Remain in the present focusing on what she can  control versus cannot, believing in herself that she can continue to make positive changes, continue her health related goals as she is working with a weight loss provider and she has lost 34 pounds per her last report, nurture family relationships that she wants to strengthen, remain in the present focusing on what she can control versus cannot, following through on better self-care, staying in touch with people who are supportive, use deep breathing exercises to help with her anxiety as needed, consistent positive self talk, being able to say no when she needs to say no without guilt, setting much healthier boundaries with others, getting outside daily to walk as she is able, healthy nutrition and physical movement, continue working on her self confidence and feeling more positive about herself, being more open to meeting new people and developing relationships that are healthy for her, and recognize the strength she shows when working with goal directed behaviors to move in a direction that supports her emotional health and self-confidence.  Review and progress/challenges noted with patient.  Next appointment within 4 to 5 weeks.  This record has been created using [employer].  Chart creation errors have been sought, but may not always have been located and corrected.  Such creation errors do not reflect on the standard of medical care provided.   Mathis Fare, LCSW

## 2022-01-09 ENCOUNTER — Ambulatory Visit (INDEPENDENT_AMBULATORY_CARE_PROVIDER_SITE_OTHER): Payer: 59 | Admitting: Psychiatry

## 2022-01-09 DIAGNOSIS — F411 Generalized anxiety disorder: Secondary | ICD-10-CM

## 2022-01-09 NOTE — Progress Notes (Signed)
[facility] Counselor/Therapist Progress Note  Patient ID: Helen Greene, MRN: [id number],    Date: 01/09/2022  Time Spent: 55 minutes   Treatment Type: Individual Therapy  Reported Symptoms: anxiety, some depression, frustration  Mental Status Exam:  Appearance:   Casual     Behavior:  Appropriate, Sharing, and Motivated  Motor:  Normal  Speech/Language:   Clear and Coherent  Affect:  anxiety  Mood:  anxious and some depression  Thought process:  goal directed  Thought content:    Overthinking mostly about work  Sensory/Perceptual disturbances:    WNL  Orientation:  oriented to person, place, time/date, situation, day of week, month of year, year, and stated date of January 09, 2022  Attention:  Good  Concentration:  Good and Fair  Memory:  WNL  Fund of knowledge:   Good  Insight:    Good and Fair  Judgment:   Good  Impulse Control:  Good   Risk Assessment: Danger to Self:  No Self-injurious Behavior: No Danger to Others: No Duty to Warn:no Physical Aggression / Violence:No  Access to Firearms a concern: No  Gang Involvement:No   Subjective: Patient today reporting anxiety and some depression regarding work, financial stressors, trouble setting healthier boundaries with certain people. Work continues to be very stressful and trying to learn lots of new information.  Did work today morning attentionally on some significant boundary issues within one of her friendships.  Discussed healthy boundaries in a lot of detail today, giving good examples and also poor examples, and worked with patient's resistance to having healthier boundaries.  She was more verbal and open today in this discussion and seemed to gain some insight as well as think about some decisions she might want to make to have healthier boundaries for herself.  Worked with some specific examples which seemed to be helpful to patient.  Still working on not being so threatened by being alone sometimes and that is  a work in progress.  Interventions: Cognitive Behavioral Therapy  Treatment Goals:Goals remain on tx plan as patient works on strategies to meet her goals. Progress will be documented each session in "Progress" section on plan. Long term goal: Develop the ability to recognize, accept, and cope with feelings of anxiety and  depression. Short term goal: Identify and replace anxious and depressive thinking that leads to anxious or depressive feelings and actions. Replace with more realistic and empowering thoughts.  Strategy: Reinforce patient's positive, reality based cognitive messages that enhance self-confidence and increase adaptive behaviors/action.  Diagnosis:   ICD-10-CM   1. Generalized anxiety disorder  F41.1      Plan: Patient today showing good participation and motivation as she worked further on her anxiety and setting of boundaries with others who take advantage of her. Very difficult for her to set and keep boundaries and we focused on this more today, with patient being able to confront the reasons why she finds it so difficult, and worked on these in session today. Overly stressing and easy to imagine worst case scenarios. Working to get her finances in "better order".  Working on changing some long-term habits including allowing others to mistreat her and take advantage of her.  Did seem more relieved at end of session and noticing some of her positives.Encouraged patient in the practice of more positive behaviors including: Remaining in the present focusing on what she can change or control, believing in herself that she can continue to make positive changes, continue her  health related goals as she is working with a weight loss provider and has lost over 40 pounds, nurture family relationships that she wants to strengthen, following through on better self-care, staying in touch with people who are supportive, using deep breathing exercises to help with her anxiety as needed,  positive self talk, being able to say no when she needs to say no, setting much healthier boundaries with others, getting outside some daily to walk, healthy nutrition and physical movement, continue working on her self confidence and feeling more positive about herself, being more open to meeting new people and developing relationships that are healthy for her, and recognize the strength she shows when working with goal directed behaviors to move in a direction that supports her emotional health and overall wellbeing.  Goal review and progress/challenges noted with patient.  Next appointment within 3 to 4 weeks.  This record has been created using [employer].  Chart creation errors have been sought, but may not always have been located and corrected.  Such creation errors do not reflect on the standard of medical care provided.   Mathis Fare, LCSW

## 2022-01-25 ENCOUNTER — Encounter: Payer: Self-pay | Admitting: Physician Assistant

## 2022-01-25 ENCOUNTER — Ambulatory Visit (INDEPENDENT_AMBULATORY_CARE_PROVIDER_SITE_OTHER): Payer: 59 | Admitting: Physician Assistant

## 2022-01-25 DIAGNOSIS — F411 Generalized anxiety disorder: Secondary | ICD-10-CM

## 2022-01-25 DIAGNOSIS — F3342 Major depressive disorder, recurrent, in full remission: Secondary | ICD-10-CM

## 2022-01-25 DIAGNOSIS — G2581 Restless legs syndrome: Secondary | ICD-10-CM | POA: Diagnosis not present

## 2022-01-25 MED ORDER — ESCITALOPRAM OXALATE 20 MG PO TABS: 20.0000 mg | ORAL_TABLET | Freq: Every day | ORAL | 3 refills | Status: DC

## 2022-01-25 MED ORDER — PRAMIPEXOLE DIHYDROCHLORIDE 0.25 MG PO TABS: 0.2500 mg | ORAL_TABLET | Freq: Every day | ORAL | 3 refills | Status: DC

## 2022-01-25 MED ORDER — LORAZEPAM 0.5 MG PO TABS: 0.5000 mg | ORAL_TABLET | Freq: Three times a day (TID) | ORAL | 1 refills | Status: DC | PRN

## 2022-01-25 MED ORDER — BUPROPION HCL ER (XL) 300 MG PO TB24: 300.0000 mg | ORAL_TABLET | Freq: Every morning | ORAL | 3 refills | Status: DC

## 2022-01-25 MED ORDER — ARIPIPRAZOLE 2 MG PO TABS: 2.0000 mg | ORAL_TABLET | Freq: Every morning | ORAL | 3 refills | Status: DC

## 2022-01-25 NOTE — Progress Notes (Signed)
[facility] Med Check  Patient ID: Helen Greene,  MRN: [id number]  PCP: Doristine Bosworth, MD  Date of Evaluation: 01/25/2022 Time spent:20 minutes  Chief Complaint:  Chief Complaint   Anxiety; Depression; Follow-up    HISTORY/CURRENT STATUS: For routine med check.  More stressed lately. Has a roommate, she's going to have to get him to move out.  It is not a good fit.  He has been there for 4 years or so.  She wants to have another roommate moving in.  Also also she was transferred several months ago, is now working in Aeronautical [employer].  That is stressful.  But overall she is doing well.  She has needed to take the Ativan a little more often than usual.  She still on a bottle that she had filled this time last year.  Patient is able to enjoy things.  Energy and motivation are good.  No extreme sadness, tearfulness, or feelings of hopelessness.  Sleeps well most of the time.  No complaints of restlessness in her legs.  ADLs and personal hygiene are normal.   Denies any changes in concentration, making decisions, or remembering things.  Appetite has not changed.  Weight is stable.  Denies suicidal or homicidal thoughts.  Patient denies increased energy with decreased need for sleep, increased talkativeness, racing thoughts, impulsivity or risky behaviors, increased spending, increased libido, grandiosity, increased irritability or anger, paranoia, and no hallucinations.  Denies dizziness, syncope, seizures, numbness, tingling, tremor, tics, unsteady gait, slurred speech, confusion. Denies muscle or joint pain, stiffness, or dystonia. Denies unexplained weight loss, frequent infections, or sores that heal slowly.  No polyphagia, polydipsia, or polyuria. Denies visual changes or paresthesias.   Individual Medical History/ Review of Systems: Changes? :No      Past medications for mental health diagnoses include: Wellbutrin XL, Risperdal caused edema, Lexapro, Mirapex, Vistaril,  questionably Xanax, Abilify, Lamictal, Klonopin, pramipexole for restless legs    Allergies: Bee venom, Enablex [darifenacin hydrobromide], Erythromycin, Macrobid [nitrofurantoin monohyd macro], Metronidazole, Nitrofuran derivatives, and Risperidone and related  Current Medications:  Current Outpatient Medications:    Cholecalciferol (VITAMIN D) 50 MCG (2000 UT) CAPS, Take 2 capsules (4,000 Units total) by mouth daily., Disp: 30 capsule, Rfl: 11   omeprazole (PRILOSEC) 20 MG capsule, Take 1 capsule (20 mg total) by mouth daily., Disp: 90 capsule, Rfl: 3   telmisartan-hydrochlorothiazide (MICARDIS HCT) 40-12.5 MG tablet, TAKE 1 TABLET DAILY, Disp: 30 tablet, Rfl: 0   valACYclovir (VALTREX) 500 MG tablet, Take 1 tablet (500 mg total) by mouth 2 (two) times daily. For 3-5 days during outbreak, Disp: 30 tablet, Rfl: 5   ARIPiprazole (ABILIFY) 2 MG tablet, Take 1 tablet (2 mg total) by mouth every morning., Disp: 90 tablet, Rfl: 3   atorvastatin (LIPITOR) 10 MG tablet, Take 1 tablet (10 mg total) by mouth every other day. (Patient not taking: Reported on 01/25/2022), Disp: 45 tablet, Rfl: 3   buPROPion (WELLBUTRIN XL) 300 MG 24 hr tablet, Take 1 tablet (300 mg total) by mouth every morning., Disp: 90 tablet, Rfl: 3   clindamycin (CLEOCIN) 2 % vaginal cream, Place 1 Applicatorful vaginally at bedtime. (Patient not taking: Reported on 01/25/2022), Disp: 40 g, Rfl: 0   escitalopram (LEXAPRO) 20 MG tablet, Take 1 tablet (20 mg total) by mouth daily., Disp: 90 tablet, Rfl: 3   Ferrous Sulfate (IRON PO), Take by mouth. (Patient not taking: Reported on 01/25/2022), Disp: , Rfl:    lidocaine (XYLOCAINE) 5 % ointment, Apply 1 application  topically 3 (three) times daily. (Patient not taking: Reported on 01/25/2022), Disp: 1.25 g, Rfl: 0   LORazepam (ATIVAN) 0.5 MG tablet, Take 1 tablet (0.5 mg total) by mouth every 8 (eight) hours as needed for anxiety., Disp: 90 tablet, Rfl: 1   metFORMIN (GLUCOPHAGE) 500 MG  tablet, Take 500 mg by mouth 2 (two) times daily. (Patient not taking: Reported on 01/25/2022), Disp: , Rfl:    nystatin-triamcinolone ointment (MYCOLOG), Apply 1 application. topically 2 (two) times daily. (Patient not taking: Reported on 01/25/2022), Disp: 30 g, Rfl: 3   ondansetron (ZOFRAN ODT) 4 MG disintegrating tablet, Take 1 tablet (4 mg total) by mouth every 8 (eight) hours as needed for nausea or vomiting. (Patient not taking: Reported on 01/25/2022), Disp: 20 tablet, Rfl: 0   pramipexole (MIRAPEX) 0.25 MG tablet, Take 1 tablet (0.25 mg total) by mouth at bedtime., Disp: 90 tablet, Rfl: 3 Medication Side Effects: none  Family Medical/ Social History: Changes?  See HPI   MENTAL HEALTH EXAM:  Last menstrual period 09/07/2015.There is no height or weight on file to calculate BMI.  General Appearance: Casual, Neat, Well Groomed and Obese  Eye Contact:  Good  Speech:  Clear and Coherent and Normal Rate  Volume:  Normal  Mood:  Euthymic  Affect:  Appropriate  Thought Process:  Goal Directed and Descriptions of Associations: Intact  Orientation:  Full (Time, Place, and Person)  Thought Content: Logical   Suicidal Thoughts:  No  Homicidal Thoughts:  No  Memory:  WNL  Judgement:  Good  Insight:  Good  Psychomotor Activity:  Normal  Concentration:  Concentration: Good and Attention Span: Good  Recall:  Good  Fund of Knowledge: Good  Language: Good  Assets:  Desire for Improvement Financial Resources/Insurance Housing Transportation Vocational/Educational  ADL's:  Intact  Cognition: WNL  Prognosis:  Good   Her PCP follows her labs.  DIAGNOSES:    ICD-10-CM   1. Recurrent major depressive disorder, in full remission (HCC)  F33.42     2. Generalized anxiety disorder  F41.1     3. Restless leg syndrome  G25.81       Receiving Psychotherapy: Yes With Rockne Menghini, LCSW.   RECOMMENDATIONS:  PDMP was reviewed.  Lorazepam filled 01/30/2021. I provided 20 minutes of face to  face time during this encounter, including time spent before and after the visit in records review, medical decision making, counseling pertinent to today's visit, and charting.   She is doing well so no changes need to be made.  Continue Abilify 2 mg every morning. Continue Wellbutrin XL 300 mg, 1 p.o. every morning. Continue Lexapro 20 mg daily. Continue Ativan 0.5 mg every 8 hours as needed anxiety. Continue pramipexole 0.25 mg nightly. Continue vitamin D 2000 IUs, 2 p.o. daily. Continue therapy with Rockne Menghini, LCSW. Return in 6 months.    Melony Overly, PA-C

## 2022-02-06 ENCOUNTER — Ambulatory Visit (INDEPENDENT_AMBULATORY_CARE_PROVIDER_SITE_OTHER): Payer: 59 | Admitting: Psychiatry

## 2022-02-06 DIAGNOSIS — F411 Generalized anxiety disorder: Secondary | ICD-10-CM
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
## 2022-02-08 ENCOUNTER — Ambulatory Visit: Payer: Managed Care, Other (non HMO)

## 2022-02-12 ENCOUNTER — Ambulatory Visit: Payer: 59 | Admitting: Psychiatry

## 2022-02-28 ENCOUNTER — Ambulatory Visit
Admission: RE | Admit: 2022-02-28 | Discharge: 2022-02-28 | Disposition: A | Discharge status: Home / Self Care | Payer: 59 | Source: Ambulatory Visit | Attending: Obstetrics & Gynecology | Admitting: Obstetrics & Gynecology

## 2022-02-28 DIAGNOSIS — Z1231 Encounter for screening mammogram for malignant neoplasm of breast: Secondary | ICD-10-CM

## 2022-03-13 ENCOUNTER — Ambulatory Visit (INDEPENDENT_AMBULATORY_CARE_PROVIDER_SITE_OTHER): Payer: 59 | Admitting: Psychiatry

## 2022-03-13 DIAGNOSIS — F411 Generalized anxiety disorder: Secondary | ICD-10-CM
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
## 2022-03-14 ENCOUNTER — Ambulatory Visit: Payer: 59 | Admitting: Psychiatry

## 2022-04-11 ENCOUNTER — Ambulatory Visit: Payer: 59 | Admitting: Psychiatry

## 2022-05-07 ENCOUNTER — Encounter: Payer: Self-pay | Admitting: Psychiatry

## 2022-05-07 ENCOUNTER — Ambulatory Visit (INDEPENDENT_AMBULATORY_CARE_PROVIDER_SITE_OTHER): Payer: 59 | Admitting: Psychiatry

## 2022-05-07 DIAGNOSIS — F411 Generalized anxiety disorder: Secondary | ICD-10-CM
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
## 2022-05-27 ENCOUNTER — Ambulatory Visit: Payer: 59 | Admitting: Psychiatry

## 2022-06-26 ENCOUNTER — Ambulatory Visit: Payer: 59 | Admitting: Psychiatry

## 2022-07-01 ENCOUNTER — Ambulatory Visit: Payer: 59 | Admitting: Psychiatry

## 2022-07-25 ENCOUNTER — Ambulatory Visit (INDEPENDENT_AMBULATORY_CARE_PROVIDER_SITE_OTHER): Payer: 59 | Admitting: Psychiatry

## 2022-07-25 DIAGNOSIS — F411 Generalized anxiety disorder: Secondary | ICD-10-CM

## 2022-07-25 NOTE — Progress Notes (Signed)
[facility] Counselor/Therapist Progress Note  Patient ID: Helen Greene, MRN: [id number],    Date: 07/25/2022  Time Spent: 55 minutes   Treatment Type: Individual Therapy  Reported Symptoms: anxiety, stressed (job and issue with person where clear boundaries are needed)  Mental Status Exam:  Appearance:   Casual and Neat     Behavior:  Appropriate, Sharing, and Motivated  Motor:  Normal  Speech/Language:   Clear and Coherent  Affect:  Anxious, stressed  Mood:  anxious and stressed  Thought process:  goal directed  Thought content:    WNL  Sensory/Perceptual disturbances:    WNL  Orientation:  oriented to person, place, time/date, situation, day of week, month of year, year, and stated date of Feb. 8, 2024  Attention:  Good  Concentration:  Good and Fair  Memory:  WNL  Fund of knowledge:   Good  Insight:    Good and Fair  Judgment:   Good and Fair  Impulse Control:  Good   Risk Assessment: Danger to Self:  No Self-injurious Behavior: No Danger to Others: No Duty to Warn:no Physical Aggression / Violence:No  Access to Firearms a concern: No  Gang Involvement:No   Subjective:  Patient in today reporting anxiety related to a change in her job at work where she is struggling to learn and is feeling overwhelmed. Easy to "get more emotional". Relationship is having challenges also. Feels other person in the home is going to finally respect her wishes and move out within the next week. Getting new room-mate soon. Was maintaining weight loss but recently has gained some back as she works extra job part-time at [employer] and has eaten some of their foods that are not as healthy for her. Looking a strategies she can use to set more effective limits on her eating at work. Sleep is ok most nights. Still feeling better physically after some weight loss. Worked well today on reducing her anxiety, setting firmer boundaries with others as needed, and communicating more directly in  relationships.   Interventions: Cognitive Behavioral Therapy and Ego-Supportive  Long term goal: Develop the ability to recognize, accept, and cope with feelings of anxiety and  depression. Short term goal: Identify and replace anxious and depressive thinking that leads to anxious or depressive feelings and actions. Replace with more realistic and empowering thoughts.  Strategy: Reinforce patient's positive, reality based cognitive messages that enhance self-confidence and increase adaptive behaviors/action.  Diagnosis:   ICD-10-CM   1. Generalized anxiety disorder  F41.1      Plan:  Patient today actively participated and showed good motivation as she worked on her anxiety, unhealthy relationship, setting healthier boundaries, and making her communication more clear and direct. Needs to continue working on her self-esteem and some of her physical self-care so as to not lose some gains she has made over recent months.  Does need to continue with goal-directed behaviors helping her to move forward gradually in a healthier direction. Encouraged patient in practicing more self affirming and positive behaviors as we discussed in the session including: Staying in the present and focusing on what she can change her control, continue her health-related goals that she is working on with medical weight loss provider, nurture family relationships which she wishes to strengthen and maintain, believe more in herself that she can make significant positive changes, follow-through on better self-care, staying in touch with people who are supportive, using deep breathing exercises to assist with her anxiety, being able  to say no when she needs to say no, positive self talk, healthier boundaries with others who tend to not be very respectful of her boundaries, getting outside some each day and walking, making positive choices, being more open to meeting new people and developing healthier relationships, and recognize  the strength she shows when working with goal-directed behaviors to move in a direction that supports her overall improved emotional health and wellbeing.  Goal review and progress/challenges noted with patient.  Next appointment within 3 to 4 weeks.  This record has been created using [employer].  Chart creation errors have been sought, but may not always have been located and corrected.  Such creation errors do not reflect on the standard of medical care provided.   Shanon Ace, LCSW

## 2022-08-02 ENCOUNTER — Ambulatory Visit: Payer: 59 | Admitting: Physician Assistant

## 2022-08-21 ENCOUNTER — Ambulatory Visit: Payer: 59 | Admitting: Psychiatry

## 2022-08-21 ENCOUNTER — Ambulatory Visit (INDEPENDENT_AMBULATORY_CARE_PROVIDER_SITE_OTHER): Payer: Self-pay | Admitting: Physician Assistant

## 2022-08-21 DIAGNOSIS — Z91199 Patient's noncompliance with other medical treatment and regimen due to unspecified reason: Secondary | ICD-10-CM

## 2022-08-21 NOTE — Progress Notes (Signed)
No show

## 2022-09-10 ENCOUNTER — Ambulatory Visit (INDEPENDENT_AMBULATORY_CARE_PROVIDER_SITE_OTHER): Payer: 59 | Admitting: Psychiatry

## 2022-09-10 DIAGNOSIS — F411 Generalized anxiety disorder: Secondary | ICD-10-CM | POA: Diagnosis not present
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
## 2022-09-16 LAB — HM COLONOSCOPY

## 2022-09-20 ENCOUNTER — Ambulatory Visit (INDEPENDENT_AMBULATORY_CARE_PROVIDER_SITE_OTHER): Payer: 59 | Admitting: Physician Assistant

## 2022-09-20 ENCOUNTER — Encounter: Payer: Self-pay | Admitting: Physician Assistant

## 2022-09-20 DIAGNOSIS — G2581 Restless legs syndrome: Secondary | ICD-10-CM | POA: Diagnosis not present

## 2022-09-20 DIAGNOSIS — F3342 Major depressive disorder, recurrent, in full remission: Secondary | ICD-10-CM

## 2022-09-20 DIAGNOSIS — F411 Generalized anxiety disorder: Secondary | ICD-10-CM

## 2022-09-20 MED ORDER — PRAMIPEXOLE DIHYDROCHLORIDE 0.25 MG PO TABS: 0.2500 mg | ORAL_TABLET | Freq: Every day | ORAL | 3 refills | Status: DC

## 2022-09-20 MED ORDER — BUPROPION HCL ER (XL) 300 MG PO TB24: 300.0000 mg | ORAL_TABLET | Freq: Every morning | ORAL | 3 refills | Status: DC

## 2022-09-20 MED ORDER — ARIPIPRAZOLE 2 MG PO TABS: 2.0000 mg | ORAL_TABLET | Freq: Every morning | ORAL | 3 refills | Status: DC

## 2022-09-20 MED ORDER — ESCITALOPRAM OXALATE 20 MG PO TABS: 20.0000 mg | ORAL_TABLET | Freq: Every day | ORAL | 3 refills | Status: DC
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
## 2022-09-25 ENCOUNTER — Ambulatory Visit (INDEPENDENT_AMBULATORY_CARE_PROVIDER_SITE_OTHER): Payer: 59 | Admitting: Psychiatry

## 2022-09-25 DIAGNOSIS — F411 Generalized anxiety disorder: Secondary | ICD-10-CM
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
## 2022-09-27 ENCOUNTER — Other Ambulatory Visit: Payer: 59

## 2022-09-27 DIAGNOSIS — R002 Palpitations: Secondary | ICD-10-CM

## 2022-10-15 ENCOUNTER — Other Ambulatory Visit: Payer: Self-pay

## 2022-10-15 DIAGNOSIS — R002 Palpitations: Secondary | ICD-10-CM

## 2022-10-24 ENCOUNTER — Ambulatory Visit (INDEPENDENT_AMBULATORY_CARE_PROVIDER_SITE_OTHER): Payer: Self-pay | Admitting: Psychiatry

## 2022-10-24 DIAGNOSIS — F411 Generalized anxiety disorder: Secondary | ICD-10-CM

## 2022-10-24 NOTE — Progress Notes (Signed)
Patient no showed for appt today

## 2022-10-30 ENCOUNTER — Telehealth: Payer: Self-pay

## 2022-10-30 NOTE — Telephone Encounter (Signed)
I sent it to her PCP.  Symptoms correlated with Premature ventricular complexes (PVC) means extra heartbeat from the lower chamber of the heart.  These are very common and are not dangerous.  Extra skipped beat coming from the bottom chamber (ventricle) and mostly are life altering (nuisance) than life threatening and mostly treated by reassurance.  There may not be any specific reasons for this, however patients with excessive caffeine, anxiety, lack of sleep, alcohol or thyroid problems can have these episodes.  Rarely patients with block coronary arteries or family history of heart muscle disease may be the reason.  If more questions, he can discuss with her doctor on office visit.

## 2022-10-30 NOTE — Telephone Encounter (Signed)
Patient aware.

## 2022-10-30 NOTE — Telephone Encounter (Signed)
Patient called and asked for monitor results. We did not order monitor it was ordered by Dr. Wynelle Link, but she hasn't heard anything please advise

## 2022-10-30 NOTE — Telephone Encounter (Signed)
Not important. And non specific

## 2022-10-30 NOTE — Telephone Encounter (Signed)
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 She is asking

## 2022-11-27 ENCOUNTER — Ambulatory Visit (INDEPENDENT_AMBULATORY_CARE_PROVIDER_SITE_OTHER): Payer: 59 | Admitting: Psychiatry

## 2022-11-27 DIAGNOSIS — F411 Generalized anxiety disorder: Secondary | ICD-10-CM | POA: Diagnosis not present

## 2022-11-27 NOTE — Progress Notes (Signed)
[facility] Counselor/Therapist Progress Note  Patient ID: Helen Greene, MRN: [id number],    Date: 11/27/2022  Time Spent: 55 minutes   Treatment Type: Individual Therapy  Reported Symptoms: anxiety (stronger) , depression, sadness, tearfulness  Mental Status Exam:  Appearance:   Casual and Neat     Behavior:  Appropriate, Sharing, and Motivated  Motor:  Normal  Speech/Language:   Clear and Coherent  Affect:  Anxious, depressed  Mood:  anxious, depressed, and sad  Thought process:  normal  Thought content:    Rumination  Sensory/Perceptual disturbances:    WNL  Orientation:  oriented to person, place, time/date, situation, day of week, month of year, year, and stated date of November 27, 2022  Attention:  Good  Concentration:  Good  Memory:  Some occasional short term issues and Dr is aware  Fund of knowledge:   Good  Insight:    Good and Fair  Judgment:   Good and Fair  Impulse Control:  Good   Risk Assessment: Danger to Self:  No Self-injurious Behavior: No Danger to Others: No Duty to Warn:no Physical Aggression / Violence:No  Access to Firearms a concern: No  Gang Involvement:No   Subjective:   Patient in for session today reporting anxiety, depression, sadness and tearfulness. States anxiety is her main symptom and is mostly related to personal, and relationship issues. Difficulty setting much needed boundaries with certain people. In midst of situations recently and did not use appropriate boundaries, allowing herself to be used again. Realizing that she is not making healthy decisions for herself and in relationships, and tearfully processed this more openly today and was also able to come up with some healthier boundaries. More open today. Tough time right now especially with personal issues within relationships, and friend's downward spiral with cancer. Work is still "off" and not sure when we'll be able to return "but still getting paid" and feels fortunate about  that. Emphasized healthier boundaries and being able to say "no" when needed.   Interventions: Cognitive Behavioral Therapy and Ego-Supportive  Long term goal: Develop the ability to recognize, accept, and cope with feelings of anxiety and  depression. Short term goal: Identify and replace anxious and depressive thinking that leads to anxious or depressive feelings and actions. Replace with more realistic and empowering thoughts.  Strategy: Reinforce patient's positive, reality based cognitive messages that enhance self-confidence and increase adaptive behaviors/action.  Diagnosis:   ICD-10-CM   1. Generalized anxiety disorder  F41.1      Plan:  Patient today actively participating in session and showing good motivation as she focused on her anxiety, sadness, depression mostly related to personal and relationship issues. Is making progress and needs to continue working with goal directed behaviors to keep moving in a forward direction. Encouraged patient to be practicing more self affirming and positive behaviors as discussed in session including: Continue working on health-related goals regarding medical weight loss, staying in the present and focused on what she can control or change, nurture family relationships which she desires to strengthen and maintain, believe more in herself that she can make significantly positive changes, improved self-care, stay in touch with people who are supportive, use of deep breathing exercises to assist with her anxiety management, be able to say no when she needs to say no without feeling guilty, positive self talk, healthier boundaries with others who tend to not be very respectful of her boundaries, getting outside some each day and walking, making positive  choices, being more open to meeting new people and developing healthier relationships, and realize the strength she shows when working with goal-directed behaviors to move in a direction that supports her  overall improved emotional health and outlook.  Self rating scales: 1-10 depression scale-3/4 1-10 anxiety scale-7 1-10 self-esteem scale-4 1-10 motivation scale-5/6 1-10 hopefulness scale-5/6   Goal review and progress/challenges noted with patient.  Next appointment within 3-4 weeks.   Mathis Fare, LCSW

## 2022-12-25 ENCOUNTER — Ambulatory Visit (INDEPENDENT_AMBULATORY_CARE_PROVIDER_SITE_OTHER): Payer: 59 | Admitting: Psychiatry

## 2022-12-25 DIAGNOSIS — F411 Generalized anxiety disorder: Secondary | ICD-10-CM

## 2022-12-25 NOTE — Progress Notes (Signed)
[facility] Counselor/Therapist Progress Note  Patient ID: Helen Greene, MRN: [id number],    Date: 12/25/2022  Time Spent: 53 minutes   Treatment Type: Individual Therapy  Reported Symptoms: anxiety, difficulty setting limits with people who take advantage of her  Mental Status Exam:  Appearance:   Casual     Behavior:  Appropriate, Sharing, and Motivated  Motor:  Normal  Speech/Language:   Clear and Coherent  Affect:  anxious  Mood:  anxious  Thought process:  goal directed  Thought content:    WNL  Sensory/Perceptual disturbances:    WNL  Orientation:  oriented to person, place, time/date, situation, day of week, month of year, year, and stated date of December 25, 2022  Attention:  Good  Concentration:  Good and Fair  Memory:  WNL  Fund of knowledge:   Good  Insight:    Good and Fair  Judgment:   Good and Fair  Impulse Control:  Good   Risk Assessment: Danger to Self:  No Self-injurious Behavior: No Danger to Others: No Duty to Warn:no Physical Aggression / Violence:No  Access to Firearms a concern: No  Gang Involvement:No   Subjective:   Patient in session today and reporting anxiety, and difficulty setting limits with people who take advantage of her in multiple ways. Working on setting some much needed limits with certain people re: at work and outside of work. (Not all details included in this note due to patient privacy needs.) Talked through examples that have occurred at work  where she found it very difficult to manage the situation and take care of her own needs. "Talking about it does help". Also working today on some of her choices she is making with other people, that ends up being unhealthy for patient. Difficult for patient to make changes in certain areas of her life, even if "they might be good changes for me." Encouraged healthier decision-making and healthier boundaries and continue working on saying "no" in situation where she knows that is the  healthiest response.   Interventions: Cognitive Behavioral Therapy and Ego-Supportive  Long term goal: Develop the ability to recognize, accept, and cope with feelings of anxiety and  depression. Short term goal: Identify and replace anxious and depressive thinking that leads to anxious or depressive feelings and actions. Replace with more realistic and empowering thoughts.  Strategy: Reinforce patient's positive, reality based cognitive messages that enhance self-confidence and increase adaptive behaviors/action.  Diagnosis:   ICD-10-CM   1. Generalized anxiety disorder  F41.1      Plan:  Patient today showing good motivation and participation in session as she worked more on her anxiety, and especially in situations where people take advantage of her.  Did some really good work and confronting some issues in session today however is difficult for her to follow through on changes and we talked about that in more detail today.  She has made some progress and needs to continue working with goal-directed behaviors to move in a forward direction.Encouraged patient in her practice of more self affirming and positive behaviors as noted in session including: Stay in the present and focused on what she can control or change, follow-through and working on her health-related goals related to medical weight loss, nurture family relationships which she desires to strengthen and maintain, believe more in herself that she can make significant positive changes, improved self-care, stay in touch with people who are supportive, use of breathing exercises to assist with her anxiety  management, saying no when she needs to say no to others without feeling guilty, positive self talk, making positive choices, being more open to meeting new people and developing healthier relationships, and recognize the strength she shows when working with goal-directed behaviors to move in a direction that supports her overall improved  emotional health and outlook.   Self rating scales: 1-10 depression scale-3/4 1-10 anxiety scale-7 1-10 self-esteem scale-4 1-10 motivation scale-5/6 1-10 hopefulness scale-5/6   Goal review and progress/challenges noted with patient.  Next appt within 5 weeeks.   Mathis Fare, LCSW

## 2023-01-28 ENCOUNTER — Ambulatory Visit (INDEPENDENT_AMBULATORY_CARE_PROVIDER_SITE_OTHER): Payer: 59 | Admitting: Psychiatry

## 2023-01-28 DIAGNOSIS — F411 Generalized anxiety disorder: Secondary | ICD-10-CM
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
## 2023-02-04 ENCOUNTER — Other Ambulatory Visit: Payer: Self-pay | Admitting: Physician Assistant

## 2023-02-10 ENCOUNTER — Ambulatory Visit (INDEPENDENT_AMBULATORY_CARE_PROVIDER_SITE_OTHER): Payer: 59 | Admitting: Psychiatry

## 2023-02-10 DIAGNOSIS — F411 Generalized anxiety disorder: Secondary | ICD-10-CM | POA: Diagnosis not present

## 2023-02-10 NOTE — Progress Notes (Signed)
[facility] Counselor/Therapist Progress Note  Patient ID: Helen Greene, MRN: [id number],    Date: 02/10/2023  Time Spent: 48 minutes  Treatment Type: Individual Therapy  Reported Symptoms: States her anxiety is quite strong but did calm down during session and being able to talk through the sudden heart attack of close friend who has been declared "brain dead" by hospital and according to patient, they are waiting on relatives to arrive before making further decisions.  Sadness, anger, shock, grief, disbelief.  Mental Status Exam:  Appearance:   Casual     Behavior:  Appropriate, Sharing, and Motivated  Motor:  Normal  Speech/Language:   Clear and Coherent  Affect:  Tearful and sad, grieving, disbelief, angry  Mood:  angry, anxious, and sad  Thought process:  normal  Thought content:    WNL  Sensory/Perceptual disturbances:    WNL  Orientation:  oriented to person, place, time/date, situation, day of week, month of year, year, and stated date of Aug.26, 2024  Attention:  Good  Concentration:  Good  Memory:  WNL  Fund of knowledge:   Good  Insight:    Good and Fair  Judgment:   Good and Fair  Impulse Control:  Good and Fair   Risk Assessment: Danger to Self:  No Self-injurious Behavior: No Danger to Others: No Duty to Warn:no Physical Aggression / Violence:No  Access to Firearms a concern: No  Gang Involvement:No   Subjective: Patient today in session very tearful and upset due to close friend age 95, having heart attack and currently hospitalized  but is reportedly "brain dead and hospital is awaiting the family to arrive." Needing session today to focus on her grief, sadness, and shock of this event with her close friend, with whom she had had recent contact.  Clarified with patient that she has people she can be in contact with tonight and she does feel good about that.  Can also reach out to some of her friends and coworkers.  A lot of tearfulness in session today  as she talked through anticipatory grief.  Seemed much more grounded by the time session ended.  Does have another appointment already scheduled within the next 1 to 2 weeks and I let her know today that if she needed to be seen sooner to feel free to call our office which she appreciated.  Interventions: Cognitive Behavioral Therapy and Grief Therapy  Long term goal: Develop the ability to recognize, accept, and cope with feelings of anxiety and  depression. Short term goal: Identify and replace anxious and depressive thinking that leads to anxious or depressive feelings and actions. Replace with more realistic and empowering thoughts.  Strategy: Reinforce patient's positive, reality based cognitive messages that enhance self-confidence and increase adaptive behaviors/action.   Diagnosis:   ICD-10-CM   1. Generalized anxiety disorder  F41.1      Plan: Patient in today as she called and tearful to the office requesting an appointment.  She explained during our time together today that her close friend that I have heard her speak about, age 30, had had a heart attack suddenly and was in the hospital where he is "declared brain dead".  This has been very unsettling and upsetting for patient and she was able to talk through this at length today in session, tearfully expressing her shock and sadness.  Patient is showing more strength and has shown some progress.  She needs to continue working with goal-directed behaviors in order  to keep moving forward. Believing more in herself that she can make significant positive changes including setting limits with others who tried to take advantage of her, stay in the present focused on what she can change or control, follow through on working on her health-related goals related to medical weight loss, nurture family relationships which she is wanting to strengthen and maintain, believe more in herself that she can make significant changes, improved self talk,  stay in touch with people who are supportive, use of breathing exercises to assist with her anxiety management, saying no when she needs to say no to others without feeling guilty, making positive choices more consistently, being more open to meeting new people and developing healthier relationships, and recognize the strength she shows when working with goal-directed behaviors to move in a direction that supports her overall improved emotional health and wellbeing.  Goal review and progress/challenges noted with patient.  Next appointment within 4 to 5 weeks.   Mathis Fare, LCSW

## 2023-03-04 ENCOUNTER — Ambulatory Visit (INDEPENDENT_AMBULATORY_CARE_PROVIDER_SITE_OTHER): Payer: 59 | Admitting: Psychiatry

## 2023-03-04 DIAGNOSIS — F3342 Major depressive disorder, recurrent, in full remission: Secondary | ICD-10-CM

## 2023-03-04 NOTE — Progress Notes (Signed)
[facility] Counselor/Therapist Progress Note  Patient ID: Helen Greene, MRN: [id number],    Date: 03/04/2023  Time Spent: 53 minutes   Treatment Type: Individual Therapy  Reported Symptoms: depression, sadness and grief from loss of close friend recently and I'm almost in denial as it just doesn't seem real.  Mental Status Exam:  Appearance:   Casual     Behavior:  Appropriate, Sharing, and Motivated  Motor:  Normal  Speech/Language:   Clear and Coherent  Affect:  Sad/depressed due to friend's death  Mood:  anxious, depressed, and sad  Thought process:  goal directed  Thought content:    WNL  Sensory/Perceptual disturbances:    WNL  Orientation:  oriented to person, place, time/date, situation, day of week, month of year, year, and stated date of Sept. 17, 2024  Attention:  Good  Concentration:  Good  Memory:  Some short term memory issues and Dr is aware  Fund of knowledge:   Good  Insight:    Good and Fair  Judgment:   Good  Impulse Control:  Good and Fair   Risk Assessment: Danger to Self:  No Self-injurious Behavior: No Danger to Others: No Duty to Warn:no Physical Aggression / Violence:No  Access to Firearms a concern: No  Gang Involvement:No   Subjective:   Patient in session today sharing more of her grief over the recent unexpected death of a close friend who was 53 years old. Feels she is doing better and still having some sadness and was able to further share her grief/tears/sadness in session today and feel supported. Has cried several times at work but does notice now some decrease in her sadness. Showing some increased strength and does feel good about that despite some self-doubt. Trying to set better limits with people who have taken advantage of her in past. Encouraged patient to be practicing positive behaviors discussed in session including: Continue working on her grief work and the unexpected death of a very close friend recently, follow through on  working with her health-related goals related to healthier physical self-care, nuture her family relationships that she is wanting to strengthen and maintain, believe more in herself that she can make significant changes, improved self talk, stay in touch with people who are supportive, saying No when she needs to say No to others without feeling guilty, making positive choices more consistently, being more open to meeting new people and developing healthier relationships, and realize the strength she shows when working with goal-directed behaviors to move in a direction that supports her overall improved emotional health and outlook into the future.  Interventions: Cognitive Behavioral Therapy and Ego-Supportive  Long term goal: Develop the ability to recognize, accept, and cope with feelings of anxiety and  depression. Short term goal: Identify and replace anxious and depressive thinking that leads to anxious or depressive feelings and actions. Replace with more realistic and empowering thoughts.  Strategy: Reinforce patient's positive, reality based cognitive messages that enhance self-confidence and increase adaptive behaviors/action.   Diagnosis:   ICD-10-CM   1. Recurrent major depressive disorder, in full remission (HCC)  F33.42      Plan:   Patient in session today following up and working on her grief of the unexpected overdose and death of a close friend.  She has remained in touch with family and other friends who have been of support to her.  Has done well in her sessions here in terms of speaking more openly about the  love and concern she has felt for her friend and the sadness and shock of his death.  Is showing some healing since last appointment.  This experience has been very upsetting for patient and she is also showing some personal and emotional growth.  Patient is making progress and needs to continue her work with goal-directed behaviors in order to keep moving in a positive and  healing direction.  Goal review and progress/challenges noted with patient.  Next appointment within 3-4 weeks.   Mathis Fare, LCSW

## 2023-03-24 ENCOUNTER — Encounter: Payer: Self-pay | Admitting: Physician Assistant

## 2023-03-24 ENCOUNTER — Ambulatory Visit (INDEPENDENT_AMBULATORY_CARE_PROVIDER_SITE_OTHER): Payer: 59 | Admitting: Physician Assistant

## 2023-03-24 DIAGNOSIS — G2581 Restless legs syndrome: Secondary | ICD-10-CM | POA: Diagnosis not present

## 2023-03-24 DIAGNOSIS — F3341 Major depressive disorder, recurrent, in partial remission: Secondary | ICD-10-CM

## 2023-03-24 DIAGNOSIS — F411 Generalized anxiety disorder: Secondary | ICD-10-CM

## 2023-03-24 DIAGNOSIS — R400 Somnolence: Secondary | ICD-10-CM

## 2023-03-24 NOTE — Progress Notes (Signed)
[facility] Med Check  Patient ID: Helen Greene,  MRN: [id number]  PCP: Deatra James, MD  Date of Evaluation: 03/24/2023 Time spent:35 minutes  Chief Complaint:  Chief Complaint   Depression; Anxiety    HISTORY/CURRENT STATUS: For routine med check.  Her SO died on her birthday, had an MI. She's handling it fairly well. She stays busy, works 12 hours/day most days, works 2 jobs.  She only gets 1 day off per week.  She is usually so tired she cannot get a whole lot done, she needs to sleep.  Feels tired every morning, hits snooze button a lot. Doesn't start feeling better until around Noon. Drinks caffeine to help.  Drinks 12 oz of coffee in the morning with occas in the afternoon but not daily. No trouble falling asleep or staying asleep. Snores. Doesn't fall asleep when driving. Takes a nap at lunch or she can't make it the rest of the day. When she gets still, she will fall asleep, like watching tv or something.  She had a sleep test many years ago but does not know what the results were, no CPAP though.  Patient is able to enjoy things, likes spending time with her grandkids when she gets a chance..  Energy and motivation are fair. No extreme sadness except when thinking about the loss of her significant other, tearfulness, or feelings of hopelessness. ADLs and personal hygiene are normal.   Denies any changes in concentration, making decisions, or remembering things.  Appetite has not changed.  Weight is stable.  Anxiety is very well-controlled.  She does take lorazepam occasionally when she gets too overwhelmed.  No panic attacks.  Her last prescription lasted an entire year.  Denies suicidal or homicidal thoughts.  Patient denies increased energy with decreased need for sleep, increased talkativeness, racing thoughts, impulsivity or risky behaviors, increased spending, increased libido, grandiosity, increased irritability or anger, paranoia, or hallucinations.  Denies dizziness,  syncope, seizures, numbness, tingling, tremor, tics, unsteady gait, slurred speech, confusion. Denies muscle or joint pain, stiffness, or dystonia. Denies unexplained weight loss, frequent infections, or sores that heal slowly.  No polyphagia, polydipsia, or polyuria. Denies visual changes or paresthesias.   Individual Medical History/ Review of Systems: Changes? :No      Past medications for mental health diagnoses include: Wellbutrin XL, Risperdal caused edema, Lexapro, Mirapex, Vistaril, questionably Xanax, Abilify, Lamictal, Klonopin, pramipexole for restless legs   Allergies: Bee venom, Enablex [darifenacin hydrobromide], Erythromycin, Macrobid [nitrofurantoin monohyd macro], Metronidazole, Nitrofuran derivatives, and Risperidone and related  Current Medications:  Current Outpatient Medications:    ARIPiprazole (ABILIFY) 2 MG tablet, Take 1 tablet (2 mg total) by mouth every morning., Disp: 90 tablet, Rfl: 3   buPROPion (WELLBUTRIN XL) 300 MG 24 hr tablet, Take 1 tablet (300 mg total) by mouth every morning., Disp: 90 tablet, Rfl: 3   Cholecalciferol (VITAMIN D) 50 MCG (2000 UT) CAPS, Take 2 capsules (4,000 Units total) by mouth daily., Disp: 30 capsule, Rfl: 11   escitalopram (LEXAPRO) 20 MG tablet, Take 1 tablet (20 mg total) by mouth daily., Disp: 90 tablet, Rfl: 3   hydrochlorothiazide (HYDRODIURIL) 12.5 MG tablet, Take 12.5 mg by mouth every morning., Disp: , Rfl:    LORazepam (ATIVAN) 0.5 MG tablet, TAKE 1 TABLET(0.5 MG) BY MOUTH EVERY 8 HOURS AS NEEDED FOR ANXIETY, Disp: 90 tablet, Rfl: 5   pramipexole (MIRAPEX) 0.25 MG tablet, Take 1 tablet (0.25 mg total) by mouth at bedtime., Disp: 90 tablet, Rfl: 3   valACYclovir (  VALTREX) 500 MG tablet, Take 1 tablet (500 mg total) by mouth 2 (two) times daily. For 3-5 days during outbreak, Disp: 30 tablet, Rfl: 5   atorvastatin (LIPITOR) 10 MG tablet, Take 1 tablet (10 mg total) by mouth every other day. (Patient not taking: Reported on  01/25/2022), Disp: 45 tablet, Rfl: 3   clindamycin (CLEOCIN) 2 % vaginal cream, Place 1 Applicatorful vaginally at bedtime. (Patient not taking: Reported on 01/25/2022), Disp: 40 g, Rfl: 0   Ferrous Sulfate (IRON PO), Take by mouth. (Patient not taking: Reported on 01/25/2022), Disp: , Rfl:    lidocaine (XYLOCAINE) 5 % ointment, Apply 1 application topically 3 (three) times daily. (Patient not taking: Reported on 01/25/2022), Disp: 1.25 g, Rfl: 0   metFORMIN (GLUCOPHAGE) 500 MG tablet, Take 500 mg by mouth 2 (two) times daily. (Patient not taking: Reported on 01/25/2022), Disp: , Rfl:    nystatin-triamcinolone ointment (MYCOLOG), Apply 1 application. topically 2 (two) times daily. (Patient not taking: Reported on 01/25/2022), Disp: 30 g, Rfl: 3   omeprazole (PRILOSEC) 20 MG capsule, Take 1 capsule (20 mg total) by mouth daily. (Patient not taking: Reported on 03/24/2023), Disp: 90 capsule, Rfl: 3   ondansetron (ZOFRAN ODT) 4 MG disintegrating tablet, Take 1 tablet (4 mg total) by mouth every 8 (eight) hours as needed for nausea or vomiting. (Patient not taking: Reported on 01/25/2022), Disp: 20 tablet, Rfl: 0   telmisartan-hydrochlorothiazide (MICARDIS HCT) 40-12.5 MG tablet, TAKE 1 TABLET DAILY (Patient not taking: Reported on 03/24/2023), Disp: 30 tablet, Rfl: 0 Medication Side Effects: none  Family Medical/ Social History: Changes?  See HPI  MENTAL HEALTH EXAM:  Last menstrual period 09/07/2015.There is no height or weight on file to calculate BMI.  General Appearance: Casual, Neat, Well Groomed and Obese  Eye Contact:  Good  Speech:  Clear and Coherent and Normal Rate  Volume:  Normal  Mood:  Euthymic  Affect:  Appropriate  Thought Process:  Goal Directed and Descriptions of Associations: Intact  Orientation:  Full (Time, Place, and Person)  Thought Content: Logical   Suicidal Thoughts:  No  Homicidal Thoughts:  No  Memory:  WNL  Judgement:  Good  Insight:  Good  Psychomotor Activity:   Normal  Concentration:  Concentration: Good and Attention Span: Good  Recall:  Good  Fund of Knowledge: Good  Language: Good  Assets:  Communication Skills Desire for Improvement Financial Resources/Insurance Housing Transportation Vocational/Educational  ADL's:  Intact  Cognition: WNL  Prognosis:  Good   Her PCP follows her labs.  Epworth sleepiness scale =16 Sitting and reading 3 Watching TV 3 Sitting in active in a public place 2 Passenger in car 3 Lying down to rest in the afternoon 3 Sitting and talking to someone 0 Sitting quietly after lunch 2 Driving and stopped at a red light 0  DIAGNOSES:    ICD-10-CM   1. Recurrent major depression in partial remission (HCC)  F33.41     2. Generalized anxiety disorder  F41.1     3. Restless leg syndrome  G25.81     4. Daytime somnolence  R40.0      Receiving Psychotherapy: Yes  With Rockne Menghini, LCSW.  RECOMMENDATIONS:  PDMP was reviewed.  Lorazepam filled 02/05/2023. I provided 35  minutes of face to face time during this encounter, including time spent before and after the visit in records review, medical decision making, counseling pertinent to today's visit, and charting.   Sleep hygiene discussed.  I recommend a sleep  study, we could order a snap diagnostic sleep study however she prefers to see a specialist to discuss and have them order the test if needed.  I will refer her to Dr. Vickey Huger.  She is doing well otherwise so no medication changes will be made.  Continue Abilify 2 mg every morning. Continue Wellbutrin XL 300 mg, 1 p.o. every morning. Continue Lexapro 20 mg daily. Continue Ativan 0.5 mg every 8 hours as needed anxiety. Continue pramipexole 0.25 mg nightly. Continue vitamin D 2000 IUs, 2 p.o. daily. Continue therapy with Rockne Menghini, LCSW. Return in 3 months.    Melony Overly, PA-C

## 2023-03-25 ENCOUNTER — Telehealth: Payer: Self-pay | Admitting: Physician Assistant

## 2023-03-25 NOTE — Telephone Encounter (Signed)
Referral faxed to [facility]

## 2023-04-01 ENCOUNTER — Ambulatory Visit: Payer: 59 | Admitting: Psychiatry

## 2023-04-15 ENCOUNTER — Ambulatory Visit: Payer: 59 | Admitting: Psychiatry

## 2023-04-29 ENCOUNTER — Ambulatory Visit (INDEPENDENT_AMBULATORY_CARE_PROVIDER_SITE_OTHER): Payer: 59 | Admitting: Psychiatry

## 2023-04-29 DIAGNOSIS — F3341 Major depressive disorder, recurrent, in partial remission: Secondary | ICD-10-CM | POA: Diagnosis not present

## 2023-04-29 NOTE — Progress Notes (Signed)
[facility] Counselor/Therapist Progress Note  Patient ID: ICY CHAMPIGNY, MRN: [id number],    Date: 04/29/2023  Time Spent: 55 minutes   Treatment Type: Individual Therapy  Reported Symptoms: depression, sadness/grief  Mental Status Exam:  Appearance:   Casual     Behavior:  Appropriate, Sharing, and Motivated  Motor:  Normal  Speech/Language:   Clear and Coherent  Affect:  Depressed  Mood:  depressed  Thought process:  normal  Thought content:    WNL  Sensory/Perceptual disturbances:    WNL  Orientation:  oriented to person, place, time/date, situation, day of week, month of year, year, and stated date of Nov. 12, 2024  Attention:  Fair  Concentration:  Good and Fair  Memory:  WNL  Fund of knowledge:   Good  Insight:    Good  Judgment:   Good  Impulse Control:  Good   Risk Assessment: Danger to Self:  No Self-injurious Behavior: No Danger to Others: No Duty to Warn:no Physical Aggression / Violence:No  Access to Firearms a concern: No  Gang Involvement:No   Subjective: Patient today reporting depression, along with sadness and grief "still working on."  Continues her grief regarding unexpected sudden death of very close 32 yr old friend about 3 months ago and a current friend living with her that is a cancer patient and is declining in health noticeably. Needed session today as her sadness had peaked recently and friend staying with her reminding patient of the death of the 60 yr old friend. Did well in talking through a lot of her unresolved grief and sadness regarding her recent loss of friend and also anticipatory grief re: friend who has cancer and lives with patient. Processed some self-doubt and also able to see some personal strengths.    Interventions: Cognitive Behavioral Therapy, Solution-Oriented/Positive Psychology, and Ego-Supportive  Long term goal: Develop the ability to recognize, accept, and cope with feelings of anxiety and  depression. Short  term goal: Identify and replace anxious and depressive thinking that leads to anxious or depressive feelings and actions. Replace with more realistic and empowering thoughts.  Strategy: Reinforce patient's positive, reality based cognitive messages that enhance self-confidence and increase adaptive behaviors/action.  Diagnosis:   ICD-10-CM   1. Recurrent major depression in partial remission (HCC)  F33.41      Plan:   Patient today focusing on her depression, sadness, grief, and reports being more confident in herself although struggling particularly with her symptoms of sadness and grief regarding a recent loss and also a friend with cancer who is living with patient and not likely to live beyond Christmas per his oncologist. Encouraged patient to practice more positive behaviors discussed in sessions including: Continue working on her grief work and the unexpected death of a very close friend recently, follow through on working on some very current grief issues regarding the friends living with her that we discussed in session today, following through on her own health related goals for better physical self-care, nurture family relationships that she is wanting to strengthen and maintain, believe more in herself that she can make significant changes, improved self talk, stay in touch with people who are supportive, saying no when she needs to say no to others without feeling guilty, making positive choices more consistently, and recognize the strength she shows when working with goal-directed behaviors to move in a direction that supports her improved emotional health and her overall wellbeing.  Goal review and progress/challenges noted with patient  Next appt within 3 weeks.   Mathis Fare, LCSW

## 2023-05-06 ENCOUNTER — Encounter: Payer: Self-pay | Admitting: Obstetrics and Gynecology

## 2023-05-06 ENCOUNTER — Ambulatory Visit (INDEPENDENT_AMBULATORY_CARE_PROVIDER_SITE_OTHER): Payer: 59 | Admitting: Obstetrics and Gynecology

## 2023-05-06 VITALS — BP 100/76 | HR 90 | Wt 232.0 lb

## 2023-05-06 DIAGNOSIS — B3731 Acute candidiasis of vulva and vagina: Secondary | ICD-10-CM

## 2023-05-06 DIAGNOSIS — N898 Other specified noninflammatory disorders of vagina: Secondary | ICD-10-CM

## 2023-05-06 DIAGNOSIS — N39498 Other specified urinary incontinence: Secondary | ICD-10-CM | POA: Diagnosis not present

## 2023-05-06 LAB — WET PREP FOR TRICH, YEAST, CLUE

## 2023-05-06 MED ORDER — FLUCONAZOLE 150 MG PO TABS: 150.0000 mg | ORAL_TABLET | Freq: Once | ORAL | 1 refills | Status: AC

## 2023-05-06 NOTE — Progress Notes (Signed)
52 y.o. [id number] female s/p hysterectomy here for vaginal discharge. Pt c/o two bumps on vulvar, vaginal itching, "having vaginal leakage that's watery" some vaginal odor. x3 week   Patient's last menstrual period was 09/07/2015.    OB History  Gravida Para Term Preterm AB Living  3 2     1 2   SAB IAB Ectopic Multiple Live Births      1        # Outcome Date GA Lbr Len/2nd Weight Sex Type Anes PTL Lv  3 Ectopic           2 Para           1 Para             Past Medical History:  Diagnosis Date   Allergy    Anxiety    Depression    Elevated white blood cell count    Elevated white blood cell count 09/25/2011   GERD (gastroesophageal reflux disease)    Headache    Migraine   HSV-2 infection    Hypertension    Kidney stone    Neuromuscular disorder (HCC)    carpel tunnel disease   Pneumonia 08/15/2017   Restless leg syndrome    Shortness of breath dyspnea    with exertion   Sleep apnea    does not have CPAP machine, mostly affects back sleeping was encouraged to sleep on her sides   STD (sexually transmitted disease)    hsv 1&2   Tonsillar calculus     Past Surgical History:  Procedure Laterality Date   COLONOSCOPY     DILATION AND CURETTAGE OF UTERUS  1993   ECTOPIC PREGNANCY SURGERY     ESSURE TUBAL LIGATION     LAPAROSCOPIC CHOLECYSTECTOMY W/ CHOLANGIOGRAPHY  02/25/2001   Dr Lindie Spruce   LITHOTRIPSY  12/22/2014   ROBOTIC ASSISTED TOTAL HYSTERECTOMY WITH SALPINGECTOMY Left 09/14/2015   Procedure: ROBOTIC ASSISTED TOTAL HYSTERECTOMY WITH LEFT SALPINGECTOMY;  Surgeon: Noland Fordyce, MD;  Location: [facility];  Service: Gynecology;  Laterality: Left;   TONSILLECTOMY N/A 08/11/2017   Procedure: TONSILLECTOMY;  Surgeon: Serena Colonel, MD;  Location: [facility];  Service: ENT;  Laterality: N/A;   UPPER GI ENDOSCOPY     WISDOM TOOTH EXTRACTION      Current Outpatient Medications on File Prior to Visit  Medication Sig Dispense Refill   ARIPiprazole  (ABILIFY) 2 MG tablet Take 1 tablet (2 mg total) by mouth every morning. 90 tablet 3   buPROPion (WELLBUTRIN XL) 300 MG 24 hr tablet Take 1 tablet (300 mg total) by mouth every morning. 90 tablet 3   Cholecalciferol (VITAMIN D) 50 MCG (2000 UT) CAPS Take 2 capsules (4,000 Units total) by mouth daily. 30 capsule 11   escitalopram (LEXAPRO) 20 MG tablet Take 1 tablet (20 mg total) by mouth daily. 90 tablet 3   hydrochlorothiazide (HYDRODIURIL) 12.5 MG tablet Take 12.5 mg by mouth every morning.     LORazepam (ATIVAN) 0.5 MG tablet TAKE 1 TABLET(0.5 MG) BY MOUTH EVERY 8 HOURS AS NEEDED FOR ANXIETY 90 tablet 5   nystatin-triamcinolone ointment (MYCOLOG) Apply 1 application. topically 2 (two) times daily. 30 g 3   ondansetron (ZOFRAN ODT) 4 MG disintegrating tablet Take 1 tablet (4 mg total) by mouth every 8 (eight) hours as needed for nausea or vomiting. 20 tablet 0   pramipexole (MIRAPEX) 0.25 MG tablet Take 1 tablet (0.25 mg total) by mouth at bedtime. 90 tablet 3  valACYclovir (VALTREX) 500 MG tablet Take 1 tablet (500 mg total) by mouth 2 (two) times daily. For 3-5 days during outbreak 30 tablet 5   atorvastatin (LIPITOR) 10 MG tablet Take 1 tablet (10 mg total) by mouth every other day. (Patient not taking: Reported on 05/06/2023) 45 tablet 3   clindamycin (CLEOCIN) 2 % vaginal cream Place 1 Applicatorful vaginally at bedtime. (Patient not taking: Reported on 05/06/2023) 40 g 0   Ferrous Sulfate (IRON PO) Take by mouth. (Patient not taking: Reported on 05/06/2023)     lidocaine (XYLOCAINE) 5 % ointment Apply 1 application topically 3 (three) times daily. (Patient not taking: Reported on 05/06/2023) 1.25 g 0   metFORMIN (GLUCOPHAGE) 500 MG tablet Take 500 mg by mouth 2 (two) times daily. (Patient not taking: Reported on 05/06/2023)     omeprazole (PRILOSEC) 20 MG capsule Take 1 capsule (20 mg total) by mouth daily. (Patient not taking: Reported on 05/06/2023) 90 capsule 3    telmisartan-hydrochlorothiazide (MICARDIS HCT) 40-12.5 MG tablet TAKE 1 TABLET DAILY (Patient not taking: Reported on 05/06/2023) 30 tablet 0   No current facility-administered medications on file prior to visit.    Allergies  Allergen Reactions   Bee Venom Hives and Swelling   Enablex [Darifenacin Hydrobromide] Other (See Comments)    Phlebitis.   Erythromycin Hives   Macrobid [Nitrofurantoin Monohyd Macro] Nausea And Vomiting   Metronidazole     GI upset with tablet form   Nitrofuran Derivatives Nausea And Vomiting   Risperidone And Related Swelling      PE Today's Vitals   05/06/23 1200  BP: 100/76  Pulse: 90  SpO2: 97%  Weight: 232 lb (105.2 kg)   Body mass index is 45.69 kg/m.  Physical Exam Vitals reviewed. Exam conducted with a chaperone present.  Constitutional:      General: She is not in acute distress.    Appearance: Normal appearance.  HENT:     Head: Normocephalic and atraumatic.     Nose: Nose normal.  Eyes:     Extraocular Movements: Extraocular movements intact.     Conjunctiva/sclera: Conjunctivae normal.  Pulmonary:     Effort: Pulmonary effort is normal.  Genitourinary:    General: Normal vulva.     Exam position: Lithotomy position.     Vagina: Vaginal discharge present.     Uterus: Absent.      Adnexa: Right adnexa normal and left adnexa normal.     Comments: Cervix and uterus absent Musculoskeletal:        General: Normal range of motion.     Cervical back: Normal range of motion.  Neurological:     General: No focal deficit present.     Mental Status: She is alert.  Psychiatric:        Mood and Affect: Mood normal.        Behavior: Behavior normal.       Assessment and Plan:        Other urinary incontinence -     Urinalysis,Complete w/RFL Culture  Vaginal discharge -     WET PREP FOR TRICH, YEAST, CLUE  Yeast vaginitis  Other orders -     Fluconazole; Take 1 tablet (150 mg total) by mouth once for 1 dose. May repeat dose  in 72 hours if unimproved.  Dispense: 2 tablet; Refill: 1  Following with urology for incontinence. Normal vulva  Rosalyn Gess, MD

## 2023-05-08 LAB — URINALYSIS, COMPLETE W/RFL CULTURE
Bilirubin Urine: NEGATIVE
Glucose, UA: NEGATIVE
Hyaline Cast: NONE SEEN /[LPF]
Ketones, ur: NEGATIVE
Leukocyte Esterase: NEGATIVE
Nitrites, Initial: NEGATIVE
Specific Gravity, Urine: 1.025 (ref 1.001–1.035)
pH: 6.5 (ref 5.0–8.0)

## 2023-05-08 LAB — CULTURE INDICATED

## 2023-05-08 LAB — URINE CULTURE
MICRO NUMBER:: 15750305
Result:: NO GROWTH
SPECIMEN QUALITY:: ADEQUATE

## 2023-05-19 ENCOUNTER — Telehealth: Payer: Self-pay | Admitting: *Deleted

## 2023-05-19 NOTE — Telephone Encounter (Signed)
Returned call to patient. Patient left voicemail asking for return call. No information given.   Call to patient. Patient crying and saying she has herpes. Patient states she is trying to have a relationship, but her partner is reading about herpes and worried. Patient asking if there is anything she can take on a daily basis for herpes suppression? States she recently was sent in 1 gram of valtrex for cold sores by PCP. Unsure if that is the dosage she needs to take on a daily basis. RN advised would review with Dr. Kennith Center and return call with recommendations. Patient agreeable. Pharmacy on file confirmed as [facility] on [address].   Routing to provider for review.

## 2023-05-20 ENCOUNTER — Other Ambulatory Visit: Payer: Self-pay

## 2023-05-20 MED ORDER — VALACYCLOVIR HCL 500 MG PO TABS: 500.0000 mg | ORAL_TABLET | Freq: Every day | ORAL | 3 refills | Status: DC

## 2023-05-20 NOTE — Telephone Encounter (Signed)
Spoke with patient, advised per Dr. Kennith Center. Patient verbalizes understanding, request Rx to pharmacy on file. Previous Rx discontinued.  Patient read back instructions.   Routing to provider for final review. Patient is agreeable to disposition. Will close encounter.

## 2023-05-21 ENCOUNTER — Telehealth: Payer: Self-pay | Admitting: *Deleted

## 2023-05-21 NOTE — Telephone Encounter (Signed)
Call returned to patient. Patient has Hx of HSV, intercourse with partner x1 with no condom, partner is concerned about exposure. Patient does not have current outbreak, just switched to suppression therapy.   Advised can take up to 3 months for HSV to be detected. Recommended f/u with PCP for testing or evaluation if any concerns or symptoms. Condom use encouraged.   Offered MyChart or OV with Dr. Kennith Center to further discuss. Patient declined. Advised I will forward to Dr. Kennith Center to review and f/u with any additional recommendations. Patient agreeable.

## 2023-06-02 ENCOUNTER — Ambulatory Visit: Payer: 59 | Admitting: Physician Assistant

## 2023-06-02 ENCOUNTER — Encounter: Payer: Self-pay | Admitting: Physician Assistant

## 2023-06-02 DIAGNOSIS — F4323 Adjustment disorder with mixed anxiety and depressed mood: Secondary | ICD-10-CM | POA: Diagnosis not present

## 2023-06-02 DIAGNOSIS — F4321 Adjustment disorder with depressed mood: Secondary | ICD-10-CM | POA: Diagnosis not present

## 2023-06-02 DIAGNOSIS — G2581 Restless legs syndrome: Secondary | ICD-10-CM | POA: Diagnosis not present

## 2023-06-02 MED ORDER — AUVELITY 45-105 MG PO TBCR: 1.0000 | EXTENDED_RELEASE_TABLET | Freq: Two times a day (BID) | ORAL | 1 refills | Status: DC

## 2023-06-02 NOTE — Progress Notes (Signed)
[facility]  Patient ID: Helen Greene,  MRN: [id number]  PCP: Deatra James, MD  Date of Evaluation: 06/02/2023 Time spent:25 minutes  Chief Complaint:  Chief Complaint   Anxiety; Depression; Follow-up    HISTORY/CURRENT STATUS: For routine med check.  More sad, anhedonia, low energy, although she works, just doesn't have energy for much else. Her SO died several months ago and that makes her even more sad.   Sleeps well most of the time. ADLs and personal hygiene are normal.   Denies any changes in concentration, making decisions, or remembering things.  Appetite has not changed.  Weight is stable.  Gets really anxious sometimes.  The Ativan is helpful. Denies suicidal or homicidal thoughts.  Patient denies increased energy with decreased need for sleep, increased talkativeness, racing thoughts, impulsivity or risky behaviors, increased spending, increased libido, grandiosity, increased irritability or anger, paranoia, or hallucinations.  Denies dizziness, syncope, seizures, numbness, tingling, tremor, tics, unsteady gait, slurred speech, confusion. Denies muscle or joint pain, stiffness, or dystonia. Denies unexplained weight loss, frequent infections, or sores that heal slowly.  No polyphagia, polydipsia, or polyuria. Denies visual changes or paresthesias.   Individual Medical History/ Review of Systems: Changes? :No      Past medications for mental health diagnoses include: Wellbutrin XL, Risperdal caused edema, Lexapro, Mirapex, Vistaril, questionably Xanax, Abilify, Lamictal, Klonopin, pramipexole for restless legs   Allergies: Bee venom, Enablex [darifenacin hydrobromide], Erythromycin, Macrobid [nitrofurantoin monohyd macro], Metronidazole, Nitrofuran derivatives, and Risperidone and related  Current Medications:  Current Outpatient Medications:    ARIPiprazole (ABILIFY) 2 MG tablet, Take 1 tablet (2 mg total) by mouth every morning., Disp: 90 tablet, Rfl: 3    Cholecalciferol (VITAMIN D) 50 MCG (2000 UT) CAPS, Take 2 capsules (4,000 Units total) by mouth daily., Disp: 30 capsule, Rfl: 11   Dextromethorphan-buPROPion ER (AUVELITY) 45-105 MG TBCR, Take 1 tablet by mouth 2 (two) times daily., Disp: 60 tablet, Rfl: 1   escitalopram (LEXAPRO) 20 MG tablet, Take 1 tablet (20 mg total) by mouth daily., Disp: 90 tablet, Rfl: 3   lidocaine (XYLOCAINE) 5 % ointment, Apply 1 application topically 3 (three) times daily., Disp: 1.25 g, Rfl: 0   LORazepam (ATIVAN) 0.5 MG tablet, TAKE 1 TABLET(0.5 MG) BY MOUTH EVERY 8 HOURS AS NEEDED FOR ANXIETY, Disp: 90 tablet, Rfl: 5   pramipexole (MIRAPEX) 0.25 MG tablet, Take 1 tablet (0.25 mg total) by mouth at bedtime., Disp: 90 tablet, Rfl: 3   valACYclovir (VALTREX) 500 MG tablet, Take 1 tablet (500 mg total) by mouth daily., Disp: 90 tablet, Rfl: 3   atorvastatin (LIPITOR) 10 MG tablet, Take 1 tablet (10 mg total) by mouth every other day. (Patient not taking: Reported on 06/02/2023), Disp: 45 tablet, Rfl: 3   clindamycin (CLEOCIN) 2 % vaginal cream, Place 1 Applicatorful vaginally at bedtime. (Patient not taking: Reported on 06/02/2023), Disp: 40 g, Rfl: 0   Ferrous Sulfate (IRON PO), Take by mouth. (Patient not taking: Reported on 06/02/2023), Disp: , Rfl:    hydrochlorothiazide (HYDRODIURIL) 12.5 MG tablet, Take 12.5 mg by mouth every morning. (Patient not taking: Reported on 06/02/2023), Disp: , Rfl:    metFORMIN (GLUCOPHAGE) 500 MG tablet, Take 500 mg by mouth 2 (two) times daily. (Patient not taking: Reported on 06/02/2023), Disp: , Rfl:    nystatin-triamcinolone ointment (MYCOLOG), Apply 1 application. topically 2 (two) times daily. (Patient not taking: Reported on 06/02/2023), Disp: 30 g, Rfl: 3   omeprazole (PRILOSEC) 20 MG capsule, Take 1 capsule (  20 mg total) by mouth daily. (Patient not taking: Reported on 06/02/2023), Disp: 90 capsule, Rfl: 3   ondansetron (ZOFRAN ODT) 4 MG disintegrating tablet, Take 1 tablet (4 mg  total) by mouth every 8 (eight) hours as needed for nausea or vomiting. (Patient not taking: Reported on 06/02/2023), Disp: 20 tablet, Rfl: 0   telmisartan-hydrochlorothiazide (MICARDIS HCT) 40-12.5 MG tablet, TAKE 1 TABLET DAILY (Patient not taking: Reported on 06/02/2023), Disp: 30 tablet, Rfl: 0 Medication Side Effects: none  Family Medical/ Social History: Changes?  See HPI  MENTAL HEALTH EXAM:  Last menstrual period 09/07/2015.There is no height or weight on file to calculate BMI.  General Appearance: Casual, Neat, Well Groomed and Obese  Eye Contact:  Good  Speech:  Clear and Coherent and Normal Rate  Volume:  Normal  Mood:  Depressed  Affect:  Depressed and Tearful  Thought Process:  Goal Directed and Descriptions of Associations: Intact  Orientation:  Full (Time, Place, and Person)  Thought Content: Logical   Suicidal Thoughts:  No  Homicidal Thoughts:  No  Memory:  WNL  Judgement:  Good  Insight:  Good  Psychomotor Activity:  Normal  Concentration:  Concentration: Good and Attention Span: Good  Recall:  Good  Fund of Knowledge: Good  Language: Good  Assets:  Communication Skills Desire for Improvement Financial Resources/Insurance Housing Transportation Vocational/Educational  ADL's:  Intact  Cognition: WNL  Prognosis:  Good   Her PCP follows her labs.  DIAGNOSES:    ICD-10-CM   1. Situational mixed anxiety and depressive disorder  F43.23     2. Restless leg syndrome  G25.81     3. Grief  F43.21       Receiving Psychotherapy: Yes  With Rockne Menghini, LCSW.  RECOMMENDATIONS:  PDMP was reviewed.  Lorazepam filled 02/05/2023. I provided 25  minutes of face to face time during this encounter, including time spent before and after the visit in records review, medical decision making, counseling pertinent to today's visit, and charting.   Disc changing Wellbutrin to [employer]. Benefits, risks, SE discussed and she accepts.   Stop Wellbutrin. Continue Abilify  2 mg every morning. Start Auvelity 45-105 mg bid. 2 weeks of samples given.  Continue Lexapro 20 mg daily. Continue Ativan 0.5 mg every 8 hours as needed anxiety. Continue pramipexole 0.25 mg nightly. Continue vitamin D 2000 IUs, 2 p.o. daily. Continue therapy with Rockne Menghini, LCSW. Return in 6 weeks.   Melony Overly, PA-C

## 2023-06-03 ENCOUNTER — Ambulatory Visit: Payer: 59 | Admitting: Psychiatry

## 2023-06-03 DIAGNOSIS — F3341 Major depressive disorder, recurrent, in partial remission: Secondary | ICD-10-CM | POA: Diagnosis not present

## 2023-06-03 NOTE — Progress Notes (Signed)
[facility] Counselor/Therapist Progress Note  Patient ID: Helen Greene, MRN: [id number],    Date: 06/03/2023  Time Spent: 50 minutes   Treatment Type: Individual Therapy  Reported Symptoms: depression, sad, grief, difficulty motivating   Mental Status Exam:  Appearance:   Casual and Neat     Behavior:  Appropriate, Sharing, and difficulty motivating  Motor:  Normal  Speech/Language:   Clear and Coherent  Affect:  Depressed and anxious  Mood:  anxious and depressed  Thought process:  goal directed  Thought content:    Rumination  Sensory/Perceptual disturbances:    WNL  Orientation:  oriented to person, place, time/date, situation, day of week, month of year, year, and stated date of Dec. 17, 2024  Attention:  Good  Concentration:  Fair  Memory:  WNL  Fund of knowledge:   Good  Insight:    Good and Fair  Judgment:   Good and Fair  Impulse Control:  Good   Risk Assessment: Danger to Self:  No Self-injurious Behavior: No Danger to Others: No Duty to Warn:no Physical Aggression / Violence:No  Access to Firearms a concern: No  Gang Involvement:No   Subjective:   Patient in session today working more on her depression, grief, and sadness regarding the death of a friend earlier who died suddenly, and also a friend who has been living with cancer and was not expected to live beyond Christmas. Processing more of her feelings re: the profound loss she feels regarding the death of one of these friends. And anticipatory loss re: the other friend that doctors have indicated will not survive much longer.  Did well in venting her sadness and sharing her sadness in unexpected loss of friend, but also eventually some gratitude for certain relationships that she has now.  Did have a new grandchild born since her last appointment and that has been a really positive thing in her life.  Encouraged her to see some of her personal strengths and to continue working through and letting go of  some of her self-doubt.  Interventions: Cognitive Behavioral Therapy and Ego-Supportive  Long term goal: Develop the ability to recognize, accept, and cope with feelings of anxiety and  depression. Short term goal: Identify and replace anxious and depressive thinking that leads to anxious or depressive feelings and actions. Replace with more realistic and empowering thoughts.  Strategy: Reinforce patient's positive, reality based cognitive messages that enhance self-confidence and increase adaptive behaviors/action.   Diagnosis:   ICD-10-CM   1. Recurrent major depression in partial remission (HCC)  F33.41      Plan:  Patient today working further on her depression, grief, some sadness, and states that she has had more confidence in herself even with her struggles more recently.  Has also been concerned and trying to help a friend who has cancer and longevity of his life is uncertain.  Encouraged patient in her practice of more positive behaviors as discussed in sessions including: Continue working on her grief work and the unexpected death of a very close friend recently, following through on her own health related goals for better physical self-care, nurture family relationships that she is wanting to strengthen and maintain, believe more in herself that she can make significant changes, improved self talk, stay in touch with people who are supportive, saying no when she needs to say no to others without feeling guilty, making positive choices more consistently, and realize the strength she shows when working with goal-directed behaviors to  move in a direction that supports her improved emotional health and her outlook into the future.  Patient has shown progress in therapy and needs to continue working with her goals to move in a more positive and healthier direction.  Goal review and progress/challenges noted with patient.  Next appointment within 3 weeks.   Mathis Fare,  LCSW

## 2023-06-18 ENCOUNTER — Telehealth: Payer: Self-pay

## 2023-06-18 NOTE — Telephone Encounter (Signed)
 Prior Authorization request received from pharmacy for pt's Auvelity  45-105 mg with [facility], requirements include four week trial, contraindication or intolerance to at least FIVE of any formulation of the following alternatives: bupropion , citalopram , duloxetine , escitalopram , fluoxetine, fluvoxamine, paroxetine, sertraline, venlafaxine immediate release/extended-release capsule.  PA not submitted until discussing with provider due to no documentation noted with above requirements. Pt has documented Bupropion  and Escitalopram  trial.

## 2023-06-19 NOTE — Telephone Encounter (Signed)
 I looked way back in her med list and and saw Prozac in 2015 but I am not sure how long she took it.  Celexa  was also listed in 2018 in progress notes by Dr. Zoe Stallings which is noted for at least 3 months.  Let's have her start Cymbalta  30 mg, 1 daily for 2 weeks and then increase to 60 mg.   At the same time decrease Lexapro  to 10 mg daily for 1 week then 5 mg daily for 1 week and then stop. She should go back to Wellbutrin  XL 300 mg daily for now. Thanks.

## 2023-06-20 NOTE — Telephone Encounter (Signed)
 Does she have a 3 month supply of the Auvelity? If she has it, stay on it and don't go back to Wellbutrin. Either way, Go ahead and change the Lexapro to Cymbalta as noted above.

## 2023-06-20 NOTE — Telephone Encounter (Signed)
 I called patient and also sent her a MyChart message with the instructions. She said she has 2 bottles that [facility] sent. Do you want to wait to make the changes until she is seen on her next appt, or go ahead and make the changes now.

## 2023-06-20 NOTE — Telephone Encounter (Signed)
 If any questions let me know

## 2023-06-24 ENCOUNTER — Ambulatory Visit: Payer: 59 | Admitting: Psychiatry

## 2023-06-24 DIAGNOSIS — F411 Generalized anxiety disorder: Secondary | ICD-10-CM | POA: Diagnosis not present

## 2023-06-24 MED ORDER — DULOXETINE HCL 30 MG PO CPEP: 30.0000 mg | ORAL_CAPSULE | Freq: Every day | ORAL | 0 refills | Status: DC

## 2023-06-24 NOTE — Progress Notes (Signed)
 [facility] Counselor/Therapist Progress Note  Patient ID: Helen Greene, MRN: [id number],    Date: 06/24/2023  Time Spent: 55 minutes   Treatment Type: Individual Therapy  Reported Symptoms: anxiety, sadness and grief, depression improved, difficulty motivating   Mental Status Exam:  Appearance:   Casual     Behavior:  Appropriated, sharing , difficulty motivating  Motor:  Normal  Speech/Language:   Clear and Coherent  Affect:  Anxious, some depression although better  Mood:  anxious and depressed  Thought process:  goal directed  Thought content:    Rumination  Sensory/Perceptual disturbances:    WNL  Orientation:  oriented to person, place, time/date, situation, day of week, month of year, year, and stated date of Jan. 7, 2025  Attention:  Good  Concentration:  Good and Fair  Memory:  WNL  Fund of knowledge:   Good  Insight:    Good and Fair  Judgment:   Good  Impulse Control:  Good   Risk Assessment: Danger to Self:  No Self-injurious Behavior: No Danger to Others: No Duty to Warn:no Physical Aggression / Violence:No  Access to Firearms a concern: No  Gang Involvement:No   Subjective:   Patient in for session today reporting symptoms of depression, anxiety, sadness, grief, some of which is related to the death of a close friend and the other related to a friend who continues to live with cancer. The friend who has cancer has received word that is has worsened and is developing more complications. Patient needed part of session today to process her thoughts and concerns about her grief, situations at work, and more recent family concerns. Crying has decreased and feels she is improving and trying to use more effective strategies especially in better managing her depression, anxiety,sadness/grief, and in making bigger decisions re: job.  She did bring up some safety issues at her second job and discussed in session today ways that she knows to exercise more caution  and be safe especially not knowing a lot of the people with whom she interacts at that job.  Feels that she is doing some better with her grief over death of a friend as noted last session, and has some sense of moving forward although it can still be up and down at times but is getting better.  Noticeably better in sessions as far as sharing and venting difficult thoughts and feelings, and also easy to feel stuck at times and making certain decisions but still showing good effort and on some occasions, good follow-through.  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  Working to be more aware of her personal strength and wanting to be more determined in her goal-directed efforts so as to let go of self-doubt and not feel that it holds her back from moving forward.  Interventions: Cognitive Behavioral Therapy and Ego-Supportive  Long term goal: Develop the ability to recognize, accept, and cope with feelings of anxiety and  depression. Short term goal: Identify and replace anxious and depressive thinking that leads to anxious or depressive feelings and actions. Replace with more realistic and empowering thoughts.  Strategy: Reinforce patient's positive, reality based cognitive messages that enhance self-confidence and increase adaptive behaviors/action.  Diagnosis:   ICD-10-CM   1. Generalized anxiety disorder  F41.1      Plan:  Patient reports working on her goals in between sessions and today presents for further  work on her depression, sadness, and grief, as also noted above. States that she continues to work on her self-confidence and tries to stay connected to friends which she feels is helpful to her.  Reminded and encouraged patient in her practice of more positive behaviors as discussed in sessions including: Continue working on her grief work and the unexpected death of a very close friend recently along with the  anticipated death of a close friend who is terminally ill, following through on her own health related goals for better physical and emotional self-care, nurture family relationships that she is wanting to strengthen and maintain, believe more in herself that she can make significant changes, improved self talk, stay in touch with people who are supportive, saying no when she needs to say no to others without feeling guilty, making positive choices more consistently, and recognize the strength she shows when working with goal-directed behaviors to move in a direction that supports her improved emotional health and her overall wellbeing.  Patient has shown progress and needs to continue working with her goals to move in a more hopeful and healthier direction.  Goal review and progress/challenges noted with patient.  Next appointment within 3 weeks.   Barnie Bunde, LCSW

## 2023-06-24 NOTE — Telephone Encounter (Signed)
 Sent Rx for Cymbalta to [facility] per request. Pulled sample of Auvelity for her to pick up at her appt with Rosey Bath on 1/29.

## 2023-06-26 NOTE — Telephone Encounter (Signed)
 Noted will hold off sending a prior authorization

## 2023-07-13 ENCOUNTER — Other Ambulatory Visit: Payer: Self-pay | Admitting: Physician Assistant

## 2023-07-14 ENCOUNTER — Telehealth: Payer: Self-pay | Admitting: Physician Assistant

## 2023-07-14 MED ORDER — ESCITALOPRAM OXALATE 20 MG PO TABS: 20.0000 mg | ORAL_TABLET | Freq: Every day | ORAL | 0 refills | Status: DC

## 2023-07-14 NOTE — Telephone Encounter (Signed)
Sent!

## 2023-07-14 NOTE — Telephone Encounter (Signed)
Helen Greene called at 10:11 to request refill of her Lexapro.  Appt 1/29 but it is going to mail order so request that it go ahead and be sent in.  Send to [facility], [place] - [address]

## 2023-07-16 ENCOUNTER — Ambulatory Visit (INDEPENDENT_AMBULATORY_CARE_PROVIDER_SITE_OTHER): Payer: 59 | Admitting: Physician Assistant

## 2023-07-16 ENCOUNTER — Telehealth: Payer: Self-pay

## 2023-07-16 ENCOUNTER — Encounter: Payer: Self-pay | Admitting: Physician Assistant

## 2023-07-16 ENCOUNTER — Other Ambulatory Visit: Payer: Self-pay | Admitting: Physician Assistant

## 2023-07-16 DIAGNOSIS — F3341 Major depressive disorder, recurrent, in partial remission: Secondary | ICD-10-CM

## 2023-07-16 DIAGNOSIS — F4321 Adjustment disorder with depressed mood: Secondary | ICD-10-CM | POA: Diagnosis not present

## 2023-07-16 DIAGNOSIS — F411 Generalized anxiety disorder: Secondary | ICD-10-CM | POA: Diagnosis not present

## 2023-07-16 DIAGNOSIS — G2581 Restless legs syndrome: Secondary | ICD-10-CM | POA: Diagnosis not present

## 2023-07-16 NOTE — Telephone Encounter (Signed)
Prior Authorization initiated with [facility] for Auvelity 45-105 mg

## 2023-07-16 NOTE — Progress Notes (Signed)
[facility]  Patient ID: Helen Greene,  MRN: [id number]  PCP: Deatra James, MD  Date of Evaluation: 07/16/2023 Time spent:25 minutes  Chief Complaint:  Chief Complaint   Depression; Follow-up    HISTORY/CURRENT STATUS: For routine med check.  Was given samples of Auvelity after Cymbalta was not effective.  The Auvelity started working right away.  Is happier, not crying all the time, not as negative as she was, motivation and energy are better, she still grieves the loss of her SO but she is coping with it better than she did.  Patient is able to enjoy things.   Work is going well.   No extreme sadness, tearfulness, or feelings of hopelessness.  Sleeps well most of the time. ADLs and personal hygiene are normal.   Denies any changes in concentration, making decisions, or remembering things.  Appetite has not changed.  Weight is stable.  No c/o anxiety.  Denies suicidal or homicidal thoughts.  Patient denies increased energy with decreased need for sleep, increased talkativeness, racing thoughts, impulsivity or risky behaviors, increased spending, increased libido, grandiosity, increased irritability or anger, paranoia, or hallucinations.  Denies dizziness, syncope, seizures, numbness, tingling, tremor, tics, unsteady gait, slurred speech, confusion. Denies muscle or joint pain, stiffness, or dystonia. Denies unexplained weight loss, frequent infections, or sores that heal slowly.  No polyphagia, polydipsia, or polyuria. Denies visual changes or paresthesias.   Individual Medical History/ Review of Systems: Changes? :No      Past medications for mental health diagnoses include: Wellbutrin XL, Risperdal caused edema, Lexapro, Mirapex, Vistaril, questionably Xanax, Abilify, Lamictal, Klonopin, pramipexole for restless legs, Celexa, Lamictal, Cymbalta ineffective, amitriptyline   Allergies: Bee venom, Enablex [darifenacin hydrobromide], Erythromycin, Macrobid [nitrofurantoin  monohyd macro], Metronidazole, Nitrofuran derivatives, and Risperidone and related  Current Medications:  Current Outpatient Medications:    ARIPiprazole (ABILIFY) 2 MG tablet, Take 1 tablet (2 mg total) by mouth every morning., Disp: 90 tablet, Rfl: 3   Cholecalciferol (VITAMIN D) 50 MCG (2000 UT) CAPS, Take 2 capsules (4,000 Units total) by mouth daily., Disp: 30 capsule, Rfl: 11   Dextromethorphan-buPROPion ER (AUVELITY) 45-105 MG TBCR, Take 1 tablet by mouth 2 (two) times daily., Disp: 60 tablet, Rfl: 1   escitalopram (LEXAPRO) 20 MG tablet, Take 1 tablet (20 mg total) by mouth daily., Disp: 90 tablet, Rfl: 0   lidocaine (XYLOCAINE) 5 % ointment, Apply 1 application topically 3 (three) times daily., Disp: 1.25 g, Rfl: 0   LORazepam (ATIVAN) 0.5 MG tablet, TAKE 1 TABLET(0.5 MG) BY MOUTH EVERY 8 HOURS AS NEEDED FOR ANXIETY, Disp: 90 tablet, Rfl: 5   atorvastatin (LIPITOR) 10 MG tablet, Take 1 tablet (10 mg total) by mouth every other day. (Patient not taking: Reported on 05/06/2023), Disp: 45 tablet, Rfl: 3   clindamycin (CLEOCIN) 2 % vaginal cream, Place 1 Applicatorful vaginally at bedtime. (Patient not taking: Reported on 06/02/2023), Disp: 40 g, Rfl: 0   Ferrous Sulfate (IRON PO), Take by mouth. (Patient not taking: Reported on 06/02/2023), Disp: , Rfl:    hydrochlorothiazide (HYDRODIURIL) 12.5 MG tablet, Take 12.5 mg by mouth every morning. (Patient not taking: Reported on 06/02/2023), Disp: , Rfl:    metFORMIN (GLUCOPHAGE) 500 MG tablet, Take 500 mg by mouth 2 (two) times daily. (Patient not taking: Reported on 05/06/2023), Disp: , Rfl:    nystatin-triamcinolone ointment (MYCOLOG), Apply 1 application. topically 2 (two) times daily. (Patient not taking: Reported on 06/02/2023), Disp: 30 g, Rfl: 3   omeprazole (PRILOSEC) 20 MG  capsule, Take 1 capsule (20 mg total) by mouth daily. (Patient not taking: Reported on 06/02/2023), Disp: 90 capsule, Rfl: 3   ondansetron (ZOFRAN ODT) 4 MG  disintegrating tablet, Take 1 tablet (4 mg total) by mouth every 8 (eight) hours as needed for nausea or vomiting. (Patient not taking: Reported on 06/02/2023), Disp: 20 tablet, Rfl: 0   pramipexole (MIRAPEX) 0.25 MG tablet, Take 1 tablet (0.25 mg total) by mouth at bedtime., Disp: 90 tablet, Rfl: 3   telmisartan-hydrochlorothiazide (MICARDIS HCT) 40-12.5 MG tablet, TAKE 1 TABLET DAILY (Patient not taking: Reported on 06/02/2023), Disp: 30 tablet, Rfl: 0   valACYclovir (VALTREX) 500 MG tablet, Take 1 tablet (500 mg total) by mouth daily., Disp: 90 tablet, Rfl: 3 Medication Side Effects: none  Family Medical/ Social History: Changes?  See HPI  MENTAL HEALTH EXAM:  Last menstrual period 09/07/2015.There is no height or weight on file to calculate BMI.  General Appearance: Casual, Neat, Well Groomed and Obese  Eye Contact:  Good  Speech:  Clear and Coherent and Normal Rate  Volume:  Normal  Mood:  Euthymic  Affect:  Congruent  Thought Process:  Goal Directed and Descriptions of Associations: Intact  Orientation:  Full (Time, Place, and Person)  Thought Content: Logical   Suicidal Thoughts:  No  Homicidal Thoughts:  No  Memory:  WNL  Judgement:  Good  Insight:  Good  Psychomotor Activity:  Normal  Concentration:  Concentration: Good and Attention Span: Good  Recall:  Good  Fund of Knowledge: Good  Language: Good  Assets:  Communication Skills Desire for Improvement Financial Resources/Insurance Housing Transportation Vocational/Educational  ADL's:  Intact  Cognition: WNL  Prognosis:  Good   Her PCP follows her labs.  DIAGNOSES:    ICD-10-CM   1. Recurrent major depression in partial remission (HCC)  F33.41     2. Generalized anxiety disorder  F41.1     3. Restless leg syndrome  G25.81     4. Grief  F43.21       Receiving Psychotherapy: Yes  With Rockne Menghini, LCSW.  RECOMMENDATIONS:  PDMP was reviewed.  Lorazepam filled 02/05/2023. I provided 25 minutes of face to  face time during this encounter, including time spent before and after the visit in records review, medical decision making, counseling pertinent to today's visit, and charting.   I am glad to see her doing so much better.  I reviewed her past progress notes and medication history and see that unfortunately she has been on several meds for depression including bupropion, citalopram, Prozac, duloxetine, and amitriptyline, all were ineffective or stopped working after a while.  Auvelity is the best option for her, and I recommend she stay on it.  No changes need to be made.  She was made aware of the high balance.  Continue Abilify 2 mg every morning. Continue Auvelity 45-105 mg bid.  Continue Lexapro 20 mg daily. Continue Ativan 0.5 mg every 8 hours as needed anxiety. Continue pramipexole 0.25 mg nightly. Continue vitamin D 2000 IUs, 2 p.o. daily. Continue therapy with Rockne Menghini, LCSW. Return in 3 months.   Melony Overly, PA-C

## 2023-07-17 NOTE — Telephone Encounter (Signed)
Patient notified of PA approval for Auvelity.

## 2023-07-17 NOTE — Telephone Encounter (Signed)
Noted

## 2023-07-17 NOTE — Telephone Encounter (Signed)
Prior Approval received for Auvelity 45-105 mg with [facility] through 07/15/2024, PA# [id number]

## 2023-07-22 ENCOUNTER — Ambulatory Visit: Payer: 59 | Admitting: Psychiatry

## 2023-07-24 ENCOUNTER — Ambulatory Visit (INDEPENDENT_AMBULATORY_CARE_PROVIDER_SITE_OTHER): Payer: 59 | Admitting: Psychiatry

## 2023-07-24 DIAGNOSIS — F411 Generalized anxiety disorder: Secondary | ICD-10-CM | POA: Diagnosis not present

## 2023-07-24 NOTE — Progress Notes (Signed)
 [facility] Counselor/Therapist Progress Note  Patient ID: Helen Greene, MRN: [id number],    Date: 07/24/2023  Time Spent: 53 minutes   Treatment Type: Individual Therapy  Reported Symptoms: anxiety, sadness and grief, depression improving and I don't cry quite as much, motivation some better and still working on it    Mental Status Exam:  Appearance:   Casual     Behavior:  Appropriate, Sharing, and some motivation and trying to increase it  Motor:  Normal  Speech/Language:   Clear and Coherent  Affect:  Anxious, depressed  Mood:  anxious, depressed, and sad  Thought process:  goal directed  Thought content:    WNL  Sensory/Perceptual disturbances:    WNL  Orientation:  oriented to person, place, time/date, situation, day of week, month of year, year, and stated date of Feb. 6, 2025  Attention:  Fair  Concentration:  Fair  Memory:  WNL  Fund of knowledge:   Good and Fair  Insight:    Good and Fair  Judgment:   Good  Impulse Control:  Good   Risk Assessment: Danger to Self:  No Self-injurious Behavior: No Danger to Others: No Duty to Warn:no Physical Aggression / Violence:No  Access to Firearms a concern: No  Gang Involvement:No    Subjective:   Patient in today as she continues to work on her anxiety, depression, sadness, grief regarding a deceased friend and 1 who continues to live with cancer at this point but is getting worse and worse and looks bad and can't hardly move around and walk. Patient tearfully processing some anticipatory grief and sadness especially about long time friend who has cancer. Another close friend had cancer and died several weeks ago. Needed session today to work on her grief which seemed some better in certain ways but also hard to shake.  Encouraged patient to let herself grieve more and how that can really be her pathway to feeling better eventually rather than trying to control the grief so much and not let herself cry.   Continues using goal-directed behaviors in trying to better manage her depression, anxiety, and grief and does feel that she has made some progress.  Denies any thoughts to harm herself or anyone else.  Another stressor for patient right now is some job insecurity issues as she feels like some people get laid off and nobody seems to know who is or who is not until it happens.  States that her supervisor is 1 that lost her job but they were others also on a supervisory level, which patient is not on that level so she has always felt that is a positive thing for her.  Overall she reports her crying has decreased some.  Encouraged more contact with friends and her church that seem supportive as well as some friends she has outside of their.  Continues to work 2 jobs because she says she has to financially and that is heavy for her at times.  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  Trying to let go more of self-doubt and believing herself more.  Interventions: Cognitive Behavioral Therapy, Solution-Oriented/Positive Psychology, and Ego-Supportive  Long term goal: Develop the ability to recognize, accept, and cope with feelings of anxiety and  depression. Short term goal: Identify and replace anxious and depressive thinking that leads to anxious or depressive feelings and actions. Replace with more realistic and empowering thoughts.  Strategy: Reinforce patient's positive, reality  based cognitive messages that enhance self-confidence and increase adaptive behaviors/action.  Diagnosis:   ICD-10-CM   1. Generalized anxiety disorder  F41.1      Plan:  Patient today showing good participation in session working more on her depression, sadness, grief, and some motivation.  Self reporting her continued work on self-confidence and renewed commitment to try and stay more connected to friends which is very important for her. Reminded and encouraged patient and practicing  more positive behaviors as discussed in sessions including: Continue working on her grief and the unexpected death of a very close friend recently along with the anticipated death of a close friend who is terminally ill, following through on her own health related goals for better physical and emotional self-care, nurture family relationships that she is wanting to strengthen and maintain, believe more in herself that she can make significant changes, improved self talk, stay in touch with people who are supportive, saying no when she needs to say no to others without feeling guilty, making positive choices more consistently, and realize the strength she shows when working with goal-directed behaviors to move in a direction that supports her improved emotional health and her outlook into the future.  Patient has definitely shown some progress and needs to continue working with her goals to move in a more hopeful and healthier direction.  Goal review and progress/challenges noted with patient.  Next appointment within 3 weeks.   Barnie Bunde, LCSW

## 2023-07-29 ENCOUNTER — Other Ambulatory Visit: Payer: Self-pay

## 2023-07-29 MED ORDER — AUVELITY 45-105 MG PO TBCR: 1.0000 | EXTENDED_RELEASE_TABLET | Freq: Two times a day (BID) | ORAL | 2 refills | Status: DC

## 2023-08-06 ENCOUNTER — Other Ambulatory Visit: Payer: Self-pay | Admitting: Physician Assistant

## 2023-08-26 ENCOUNTER — Ambulatory Visit (INDEPENDENT_AMBULATORY_CARE_PROVIDER_SITE_OTHER): Payer: 59 | Admitting: Psychiatry

## 2023-08-26 DIAGNOSIS — F411 Generalized anxiety disorder: Secondary | ICD-10-CM

## 2023-08-26 NOTE — Progress Notes (Signed)
 [facility] Counselor/Therapist Progress Note  Patient ID: Helen Greene, MRN: [id number],    Date: 08/26/2023  Time Spent: 53 minutes   Treatment Type: Individual Therapy  Reported Symptoms: anxiety, sadness and grief, emotional, depression still improving    Mental Status Exam:  Appearance:   Casual     Behavior:  Appropriate, Sharing, and Motivated  Motor:  Normal  Speech/Language:   Clear and Coherent  Affect:  Depressed, anxious  Mood:  anxious, depressed, and some grief  Thought process:  goal directed  Thought content:    WNL  Sensory/Perceptual disturbances:    WNL  Orientation:  oriented to person, place, time/date, situation, day of week, month of year, year, and stated date of August 26, 2023  Attention:  Good  Concentration:  Good  Memory:  WNL  Fund of knowledge:   Good  Insight:    Good and Fair  Judgment:   Good and Fair  Impulse Control:  Good and Fair   Risk Assessment: Danger to Self:  No Self-injurious Behavior: No Danger to Others: No Duty to Warn:no Physical Aggression / Violence:No  Access to Firearms a concern: No  Gang Involvement:No   Subjective:  Patient today in session and working further on her anxiety, depression, sadness, motivation, and some lingering grief regarding a close deceased friend.  Motivation improving gradually. Participated well in working on some of her unresolved grief regarding the death of her friend and some anticipatory grief issues regarding a person who is critically ill and is a friend of patient.  Dreaming sometimes about closer friend that died about 6 months ago. Encouraged her being able to let herself grieve and discussed again today how healthy grieving can and actually help her to be able to move forward and more helpful and hopeful ways rather than trying to control and contain the grief or refrain from letting herself cry and express her grief.  Denies any thoughts to harm herself or anyone else.  Decreased  tearfulness.  She does continue to work with goal-directed behaviors and efforts to better manage her anxiety, depression, and some of her grief and feels that she has experienced some progress as a result.  Reduced feelings of job insecurity. Took some time off "from church for a while but went back last Sunday and it went ok" as they have been supportive of patient over time. Feels she needs to keep working 2 jobs for financial reasons but hopes to eventually be able to not work a second job. Feeling some decrease in her self doubt and feels good about that.   Interventions: Cognitive Behavioral Therapy and Ego-Supportive  Long term goal: Develop the ability to recognize, accept, and cope with feelings of anxiety and  depression. Short term goal: Identify and replace anxious and depressive thinking that leads to anxious or depressive feelings and actions. Replace with more realistic and empowering thoughts.  Strategy: Reinforce patient's positive, reality based cognitive messages that enhance self-confidence and increase adaptive behaviors/action.   Diagnosis:   ICD-10-CM   1. Generalized anxiety disorder  F41.1      Plan:  Patient working well today in session focusing more on her depression, sadness, grief, and her motivation.  She self reports that she tries to stay connected with healthy friendships and continues her work on self-confidence and making healthy decisions.  Encouraged patient and her practicing more positive behaviors as discussed in sessions including: Following through on her own health related goals for better  physical and emotional care, continue working on her grief regarding the unexpected death of a very close friend in recent months, nurture family relationships that she is wanting to strengthen and maintain, believe more in herself that she can make significant changes, improved self talk, stay in touch with people who are supportive, saying no when she needs to say no  to others without feeling guilty, making positive choices more consistently, and recognize the strength she shows when working with goal-directed behaviors to move in a direction that supports her improved emotional health and her overall wellbeing.  This patient continues to show progress and needs to continue working with her goals to keep moving in a more hopeful and healthier direction.  Goal review and progress/challenges noted with patient.  Next appointment within 3-4 weeks.   Mathis Fare, LCSW

## 2023-09-24 ENCOUNTER — Ambulatory Visit: Payer: 59 | Admitting: Psychiatry

## 2023-09-24 DIAGNOSIS — F411 Generalized anxiety disorder: Secondary | ICD-10-CM | POA: Diagnosis not present

## 2023-09-24 NOTE — Progress Notes (Signed)
 [facility] Counselor/Therapist Progress Note  Patient ID: Helen Greene, MRN: [id number],    Date: 09/24/2023  Time Spent: 50 minutes   Treatment Type: Individual Therapy  Reported Symptoms: anxiety heightened some, some sadness, emotional, depression continues improving   Mental Status Exam:  Appearance:   Casual     Behavior:  Appropriate, Sharing, and Motivated  Motor:  Normal  Speech/Language:   Clear and Coherent  Affect:  Depressed and anxiety  Mood:  anxious and depressed  Thought process:  goal directed  Thought content:    WNL  Sensory/Perceptual disturbances:    WNL  Orientation:  oriented to person, place, time/date, situation, day of week, month of year, year, and stated date of September 24, 2023  Attention:  Good  Concentration:  Good and Fair  Memory:  WNL  Fund of knowledge:   Good  Insight:    Good and Fair  Judgment:   Good and Fair  Impulse Control:  Good   Risk Assessment: Danger to Self:  No Self-injurious Behavior: No Danger to Others: No Duty to Warn:no Physical Aggression / Violence:No  Access to Firearms a concern: No  Gang Involvement:No   Subjective:   Patient in session today and continues to work on her anxiety, depression, some lingering grief and sadness regarding the death of a close friend, motivation, and self-esteem. Needed session today to process more of her grief as she had a good friend die last week who had been struggling with cancer for over a year. Patient sharing and talking through her grief and some mixed feelings she has had. Stressing more over money again and feeling this is increasing her anxiety. Working 2 jobs. Today sharing that they have found "a place on her breast that is of concern and she has appt this week on the 11th to get it checked out. Tearfully talks through some of her anxiety and fears. Has sister who had breast cancer but eventually "got ok". Participation good today in session, but tough session for patient  emotionally. Denies thoughts to harm herself or anyone else.  Does state that she has 2 friends that she will be in touch with at least one of each day.  She continues to work with her goal-directed behaviors and more recently has just had to deal with significant loss and sadness as well as stressors financially.  Continues to work with goal-directed behaviors and efforts to manage her strong emotions of anxiety, some depression, and grief.  Was not feeling today that her job is insecure.  Reports that she has gone back to attending church and seeing friends there which does feel supportive to her.  Interventions: Cognitive Behavioral Therapy, Solution-Oriented/Positive Psychology, and Ego-Supportive  Long term goal: Develop the ability to recognize, accept, and cope with feelings of anxiety and  depression. Short term goal: Identify and replace anxious and depressive thinking that leads to anxious or depressive feelings and actions. Replace with more realistic and empowering thoughts.  Strategy: Reinforce patient's positive, reality based cognitive messages that enhance self-confidence and increase adaptive behaviors/action.    Diagnosis:   ICD-10-CM   1. Generalized anxiety disorder  F41.1      Plan: Patient in session today and working further on her depression, stressed, sadness, increasing her motivation, and grief.  Trying to work on the symptoms and also remain closer and friendships where she can have support. Patient today in session and continuing to work on her depression, grief, sadness, and trying  to be more motivated.  Is trying to remain in touch with healthy friendships as she continues to work further on making healthier decisions, working through more of her grief, and having more self-confidence. Encouraged patient in her practice of more positive behaviors as discussed further in sessions including: Following through on her own health related goals for better physical and  emotional care, continuing her work on her grief regarding the unexpected death of a very close friend in recent months, nurture family relationships that she is wanting to strengthen and maintain, believe more in herself that she can make significant changes, improved self talk, stay in touch with people who are supportive, saying no when she needs to say no to others without feeling guilty, making positive choices more consistently, and realize the strengths she shows when working with goal-directed behaviors to move in a direction that supports her improved emotional health and her outlook into the future.  Mariko Nowakowski has made progress and she needs to continue working with her goals to keep moving in a more hopeful and healthier direction.  Goal review and progress/challenges noted with patient.  Next appointment within 3 to 4 weeks.   Mathis Fare, LCSW

## 2023-09-29 ENCOUNTER — Other Ambulatory Visit: Payer: Self-pay | Admitting: Family Medicine

## 2023-09-29 DIAGNOSIS — N644 Mastodynia: Secondary | ICD-10-CM

## 2023-10-01 ENCOUNTER — Other Ambulatory Visit: Payer: Self-pay | Admitting: Obstetrics and Gynecology

## 2023-10-01 NOTE — Telephone Encounter (Signed)
 Medication refill request: diflucan Last AEX:  11-26-21 Last OV: 05-06-23 Next AEX: none Last MMG (if hormonal medication request): n/a Refill authorized: pt states she just took a diflucan she has on hand & feels like her symptoms are getting better. Patient will call back if not. She is aware she may need to come in for an appointment. This rx denied.

## 2023-10-05 ENCOUNTER — Other Ambulatory Visit: Payer: Self-pay

## 2023-10-07 ENCOUNTER — Ambulatory Visit
Admission: RE | Admit: 2023-10-07 | Discharge: 2023-10-07 | Disposition: A | Discharge status: Home / Self Care | Source: Ambulatory Visit | Attending: Family Medicine | Admitting: Family Medicine

## 2023-10-07 DIAGNOSIS — N644 Mastodynia: Secondary | ICD-10-CM

## 2023-10-08 ENCOUNTER — Ambulatory Visit: Payer: 59 | Admitting: Psychiatry

## 2023-10-09 ENCOUNTER — Ambulatory Visit: Payer: 59 | Admitting: Physician Assistant

## 2023-10-22 ENCOUNTER — Other Ambulatory Visit: Payer: Self-pay

## 2023-10-22 MED ORDER — AUVELITY 45-105 MG PO TBCR: 1.0000 | EXTENDED_RELEASE_TABLET | Freq: Two times a day (BID) | ORAL | 0 refills | Status: DC

## 2023-10-28 ENCOUNTER — Ambulatory Visit: Payer: 59 | Admitting: Psychiatry

## 2023-11-26 ENCOUNTER — Ambulatory Visit (INDEPENDENT_AMBULATORY_CARE_PROVIDER_SITE_OTHER): Admitting: Psychiatry

## 2023-11-26 DIAGNOSIS — F4323 Adjustment disorder with mixed anxiety and depressed mood: Secondary | ICD-10-CM

## 2023-11-26 NOTE — Progress Notes (Signed)
 [facility] Counselor/Therapist Progress Note  Patient ID: Helen Greene, MRN: [id number],    Date: 11/26/2023  Time Spent: 55 minutes   Treatment Type: Individual Therapy  Reported Symptoms:  anxiety, depression, sadness, denies any SI   Mental Status Exam:  Appearance:   Casual     Behavior:  Appropriate, Sharing, and lower motivation  Motor:  Normal  Speech/Language:   Clear and Coherent  Affect:  Depressed and Tearful  Mood:  anxious, depressed, and sad  Thought process:  goal directed  Thought content:    Rumination  Sensory/Perceptual disturbances:    WNL  Orientation:  oriented to person, place, time/date, situation, day of week, month of year, year, and stated date of November 26, 2023  Attention:  Good  Concentration:  Good and Fair  Memory:  WNL  Fund of knowledge:   Good  Insight:    Good and Fair  Judgment:   Good and Fair  Impulse Control:  Good   Risk Assessment: Danger to Self:  No Self-injurious Behavior: No Danger to Others: No Duty to Warn:no Physical Aggression / Violence:No  Access to Firearms a concern: No  Gang Involvement:No   Subjective: Patient today working in session on her tendency to hibernate and spend more time alone. Not getting out for church anymore and seeing friends there. Works from home and then stays at home versus getting out with others. Sadness, anxiety, depression but denies thoughts to harm self. Is still working second job at [employer] for 4 nights.  Is working 2 jobs which is a lot of stress and probably is feeding part of her anxiety and depression however patient says she needs to for money reasons.  Feels her meds may not be helping her. Some grief still lingering re: close friend that died last 16-Sep-2026on patient's birthday. Another close friend died 2023/10/06. Has developed another friend who does hang out with me sometimes. Is more avoiding of people and this came up several times during session today. Some  tearfulness of and on in session. Denies any thoughts to harm self. Did talk more openly today the longer we talked. Is to try to reach out to some friends and to her church in the meantime for support.  Did commit to no harming of herself and reports that she is not currently having such thoughts.  Did discuss in case she got to feeling worse and started having any SI, that she would need to go to either the ED at the hospital or behavioral health to be evaluated.  Patient does have an appointment with her med provider here at this office next week and she states she will make sure she gets to that appointment.   Interventions: Cognitive Behavioral Therapy, Solution-Oriented/Positive Psychology, and Ego-Supportive Long term goal: Develop the ability to recognize, accept, and cope with feelings of anxiety and  depression. Short term goal: Identify and replace anxious and depressive thinking that leads to anxious or depressive feelings and actions. Replace with more realistic and empowering thoughts.  Strategy: Reinforce patient's positive, reality based cognitive messages that enhance self-confidence and increase adaptive behaviors/action.    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  She questions her medication as to  whether it is helping and fortunately she already has an appointment with her med provider next week and wants to see if med provider thinks any changes should be made in her medication or not.  Knows that her med provider is very supportive of her and looks forward to seeing her at that appointment next week.  Was much calmer today upon leaving appointment and again promised to follow-up with a few people that she knows cares about her (friends and church). Reminded  and encouraged patient in practicing more positive behaviors as noted in sessions including: Trying to work on her health goals for better physical and emotional care, focusing some on her grief re: unexpected death of close friend within the past year and the death of another close friend in early April of this year, wanting to believe more in herself that she can make significant changes, wanting to strengthen helpful relationships, wanting to improve her self-talk, staying in touch with people who are supportive, saying no when she needs to say no to others without feeling guilty, making positive choices more consistently, and recognize the strengths she shows when working with goal-directed behaviors to move in a direction that supports her improved emotional health and overall outlook.  Helen Greene continues to make some progress although also having a lot of challenges and she recognizes need to continue working with goal-directed behaviors to move in a more hopeful and healthier direction into the future.  Goal review and progress/challenges noted with patient.  Next appt within the month.     Helen Patee, LCSW

## 2023-11-28 ENCOUNTER — Other Ambulatory Visit: Payer: Self-pay | Admitting: Physician Assistant

## 2023-12-02 ENCOUNTER — Ambulatory Visit (INDEPENDENT_AMBULATORY_CARE_PROVIDER_SITE_OTHER): Admitting: Physician Assistant

## 2023-12-02 ENCOUNTER — Encounter: Payer: Self-pay | Admitting: Physician Assistant

## 2023-12-02 DIAGNOSIS — G2581 Restless legs syndrome: Secondary | ICD-10-CM

## 2023-12-02 DIAGNOSIS — F3341 Major depressive disorder, recurrent, in partial remission: Secondary | ICD-10-CM

## 2023-12-02 DIAGNOSIS — F411 Generalized anxiety disorder: Secondary | ICD-10-CM

## 2023-12-02 DIAGNOSIS — R5381 Other malaise: Secondary | ICD-10-CM

## 2023-12-02 MED ORDER — ESCITALOPRAM OXALATE 20 MG PO TABS: 30.0000 mg | ORAL_TABLET | Freq: Every day | ORAL | 1 refills | Status: DC

## 2023-12-02 NOTE — Progress Notes (Signed)
 [facility]  Patient ID: Helen Greene,  MRN: [id number]  PCP: Sun, Vyvyan, MD  Date of Evaluation: 12/02/2023 Time spent:35 minutes  Chief Complaint:  Chief Complaint   Anxiety; Depression; Follow-up    HISTORY/CURRENT STATUS: For routine med check.  Sleeps a lot when she is not working.  Will lie back down on Saturdays after she takes care of her dog.  Stays exhausted.  Works 56 hours/week.  She has 1 full time, plus a part time job.  Stopped going to church which is very unlike her.  Anhedonia.  She is sad and cries easily.  Appetite is normal, weight is stable.  ADLs are normal but does the least she can to get by.  Personal hygiene normal.  Is overwhelmed a lot.  No panic attacks.  Ativan  is effective when needed.  No mania, psychosis, or delirium.  Denies SI/HI.  Even though she is sleeping a lot she does not feel rested when she gets up.  Reports snoring.  She does not wake up gasping for air.  She has not gained weight.     Denies dizziness, syncope, seizures, numbness, tingling, tremor, tics, unsteady gait, slurred speech, confusion. Denies muscle or joint pain, stiffness, or dystonia.  Individual Medical History/ Review of Systems: Changes? :No      Past medications for mental health diagnoses include: Wellbutrin  XL, Risperdal caused edema, Lexapro , Mirapex , Vistaril , questionably Xanax , Abilify , Lamictal , Klonopin, pramipexole  for restless legs, Celexa , Lamictal , Cymbalta  ineffective, amitriptyline   Allergies: Bee venom, Enablex [darifenacin hydrobromide], Erythromycin, Macrobid [nitrofurantoin monohyd macro], Metronidazole , Nitrofuran derivatives, and Risperidone and related  Current Medications:  Current Outpatient Medications:    ARIPiprazole  (ABILIFY ) 2 MG tablet, TAKE 1 TABLET BY MOUTH EVERY  MORNING, Disp: 90 tablet, Rfl: 3   Cholecalciferol (VITAMIN D ) 50 MCG (2000 UT) CAPS, Take 2 capsules (4,000 Units total) by mouth daily., Disp: 30 capsule, Rfl:  11   Dextromethorphan-buPROPion  ER (AUVELITY ) 45-105 MG TBCR, Take 1 tablet by mouth 2 (two) times daily., Disp: 60 tablet, Rfl: 0   lidocaine  (XYLOCAINE ) 5 % ointment, Apply 1 application topically 3 (three) times daily., Disp: 1.25 g, Rfl: 0   LORazepam  (ATIVAN ) 0.5 MG tablet, TAKE 1 TABLET(0.5 MG) BY MOUTH EVERY 8 HOURS AS NEEDED FOR ANXIETY, Disp: 90 tablet, Rfl: 5   pramipexole  (MIRAPEX ) 0.25 MG tablet, TAKE 1 TABLET BY MOUTH AT  BEDTIME, Disp: 90 tablet, Rfl: 3   atorvastatin  (LIPITOR) 10 MG tablet, Take 1 tablet (10 mg total) by mouth every other day. (Patient not taking: Reported on 05/06/2023), Disp: 45 tablet, Rfl: 3   clindamycin  (CLEOCIN ) 2 % vaginal cream, Place 1 Applicatorful vaginally at bedtime. (Patient not taking: Reported on 06/02/2023), Disp: 40 g, Rfl: 0   escitalopram  (LEXAPRO ) 20 MG tablet, Take 1.5 tablets (30 mg total) by mouth daily., Disp: 135 tablet, Rfl: 1   Ferrous Sulfate (IRON PO), Take by mouth. (Patient not taking: Reported on 06/02/2023), Disp: , Rfl:    hydrochlorothiazide  (HYDRODIURIL ) 12.5 MG tablet, Take 12.5 mg by mouth every morning. (Patient not taking: Reported on 06/02/2023), Disp: , Rfl:    metFORMIN (GLUCOPHAGE) 500 MG tablet, Take 500 mg by mouth 2 (two) times daily. (Patient not taking: Reported on 05/06/2023), Disp: , Rfl:    nystatin -triamcinolone  ointment (MYCOLOG), Apply 1 application. topically 2 (two) times daily. (Patient not taking: Reported on 06/02/2023), Disp: 30 g, Rfl: 3   omeprazole  (PRILOSEC) 20 MG capsule, Take 1 capsule (20 mg total) by mouth daily. (  Patient not taking: Reported on 06/02/2023), Disp: 90 capsule, Rfl: 3   ondansetron  (ZOFRAN  ODT) 4 MG disintegrating tablet, Take 1 tablet (4 mg total) by mouth every 8 (eight) hours as needed for nausea or vomiting. (Patient not taking: Reported on 06/02/2023), Disp: 20 tablet, Rfl: 0   telmisartan -hydrochlorothiazide  (MICARDIS  HCT) 40-12.5 MG tablet, TAKE 1 TABLET DAILY (Patient not  taking: Reported on 06/02/2023), Disp: 30 tablet, Rfl: 0   valACYclovir  (VALTREX ) 500 MG tablet, Take 1 tablet (500 mg total) by mouth daily., Disp: 90 tablet, Rfl: 3 Medication Side Effects: none  Family Medical/ Social History: Changes?  See HPI  MENTAL HEALTH EXAM:  Last menstrual period 09/07/2015.There is no height or weight on file to calculate BMI.  General Appearance: Casual, Neat, Well Groomed and Obese  Eye Contact:  Good  Speech:  Clear and Coherent and Normal Rate  Volume:  Normal  Mood:  Depressed  Affect:  Depressed  Thought Process:  Goal Directed and Descriptions of Associations: Intact  Orientation:  Full (Time, Place, and Person)  Thought Content: Logical   Suicidal Thoughts:  No  Homicidal Thoughts:  No  Memory:  WNL  Judgement:  Good  Insight:  Good  Psychomotor Activity:  Normal  Concentration:  Concentration: Good and Attention Span: Good  Recall:  Good  Fund of Knowledge: Good  Language: Good  Assets:  Communication Skills Desire for Improvement Financial Resources/Insurance Housing Transportation Vocational/Educational  ADL's:  Intact  Cognition: WNL  Prognosis:  Good   Her PCP follows her labs.  Epworth sleep scale Sitting in active in a public place.  0  sitting and reading 3,  watching TV 3  passenger in a car for an hour 3,  lying down to rest 3,  sitting and talking to someone 0,  sitting quietly after a meal without alcohol 3,  In car as the driver stopped at a red light 0  =15  Neck circumference 15 inches  DIAGNOSES:    ICD-10-CM   1. Recurrent major depression in partial remission (HCC)  F33.41     2. Restless leg syndrome  G25.81     3. Generalized anxiety disorder  F41.1     4. Malaise and fatigue  R53.81    R53.83       Receiving Psychotherapy: Yes  With Marval Bunde, LCSW.  RECOMMENDATIONS:  PDMP was reviewed.  Lorazepam  filled 02/05/2023. I provided 35 minutes of face to face time during this encounter,  including time spent before and after the visit in records review, medical decision making, counseling pertinent to today's visit, and charting.   I highly suspect sleep apnea.  We have discussed this in the past and I recommended she see the sleep management.  I am not sure where the disconnect occurred but patient states she never got a phone call about the referral.  I think at this point an in-home sleep study is sufficient for diagnosis but if not I will refer her to sleep management.  She understands. Ordering SNAP in home sleep study.   Discussed the depression.  Sleep deprivation can be a factor so it is imperative that the sleep study is done.  I recommend increasing the Lexapro  dose.  She would like to try it.  Continue Abilify  2 mg every morning. Continue Auvelity  45-105 mg bid.  Increase Lexapro  20 mg, 1.5 pills daily.  Continue Ativan  0.5 mg every 8 hours as needed anxiety. Continue pramipexole  0.25 mg nightly. Continue vitamin D  2000 IUs,  2 p.o. daily, MVI, B Complex. Continue therapy with Marval Bunde, LCSW. Return in 6 to 8 weeks.   Verneita Cooks, PA-C

## 2023-12-15 ENCOUNTER — Telehealth: Payer: Self-pay | Admitting: Physician Assistant

## 2023-12-15 ENCOUNTER — Other Ambulatory Visit: Payer: Self-pay

## 2023-12-15 MED ORDER — AUVELITY 45-105 MG PO TBCR: 1.0000 | EXTENDED_RELEASE_TABLET | Freq: Two times a day (BID) | ORAL | 1 refills | Status: DC

## 2023-12-15 NOTE — Telephone Encounter (Signed)
 Pt called asking  for a refill on her auvelity  45 mg. The pharmacy is [facility] in [place] ,[place]. She needs 1 bottle of samples to get her by until her refill comes in.

## 2023-12-15 NOTE — Telephone Encounter (Signed)
 Rf for Auvelity  sent to [facility]. Will pull samples tomorrow and call patient.   Lauraine said she has already pulled samples and patient is aware.

## 2023-12-16 ENCOUNTER — Telehealth: Payer: Self-pay | Admitting: Physician Assistant

## 2023-12-16 NOTE — Telephone Encounter (Signed)
 Faxed form and documentation for SNAP home sleep study. Pt notified.

## 2023-12-16 NOTE — Telephone Encounter (Signed)
 Noted

## 2023-12-17 ENCOUNTER — Telehealth: Payer: Self-pay

## 2023-12-17 NOTE — Telephone Encounter (Signed)
 Prior authorization for Escitalopram  20 mg #135/90 day, [facility] [phone] [id number], after contacting them they report no PA needed it ran with a paid claim. Will fax [facility] pharmacy to have them try and fill again.

## 2023-12-24 ENCOUNTER — Ambulatory Visit (INDEPENDENT_AMBULATORY_CARE_PROVIDER_SITE_OTHER): Admitting: Obstetrics and Gynecology

## 2023-12-24 ENCOUNTER — Encounter: Payer: Self-pay | Admitting: Obstetrics and Gynecology

## 2023-12-24 VITALS — BP 124/80 | HR 100 | Temp 97.9°F | Wt 236.0 lb

## 2023-12-24 DIAGNOSIS — N907 Vulvar cyst: Secondary | ICD-10-CM | POA: Diagnosis not present

## 2023-12-24 DIAGNOSIS — Z113 Encounter for screening for infections with a predominantly sexual mode of transmission: Secondary | ICD-10-CM | POA: Diagnosis not present

## 2023-12-24 NOTE — Addendum Note (Signed)
 Addended by: Catina Nuss on: 12/24/2023 11:48 AM   Modules accepted: Orders

## 2023-12-24 NOTE — Progress Notes (Signed)
 53 y.o. [id number] female s/p hysterectomy here for vaginal bumps.  Vaginal bumps near bottom of vagina, sensitive to wiping the area. Bumps has been there for a while. States she has not shaved in a while and the bumps are still there.   New sexual partner.  OB History  Gravida Para Term Preterm AB Living  3 2   1 2   SAB IAB Ectopic Multiple Live Births    1      # Outcome Date GA Lbr Len/2nd Weight Sex Type Anes PTL Lv  3 Ectopic           2 Para           1 Para             Past Medical History:  Diagnosis Date   Allergy    Anxiety    Depression    Elevated white blood cell count    Elevated white blood cell count 09/25/2011   GERD (gastroesophageal reflux disease)    Headache    Migraine   HSV-2 infection    Hypertension    Kidney stone    Neuromuscular disorder (HCC)    carpel tunnel disease   Pneumonia 08/15/2017   Restless leg syndrome    Shortness of breath dyspnea    with exertion   Sleep apnea    does not have CPAP machine, mostly affects back sleeping was encouraged to sleep on her sides   STD (sexually transmitted disease)    hsv 1&2   Tonsillar calculus     Past Surgical History:  Procedure Laterality Date   COLONOSCOPY     DILATION AND CURETTAGE OF UTERUS  1993   ECTOPIC PREGNANCY SURGERY     ESSURE TUBAL LIGATION     LAPAROSCOPIC CHOLECYSTECTOMY W/ CHOLANGIOGRAPHY  02/25/2001   Dr Kimble   LITHOTRIPSY  12/22/2014   ROBOTIC ASSISTED TOTAL HYSTERECTOMY WITH SALPINGECTOMY Left 09/14/2015   Procedure: ROBOTIC ASSISTED TOTAL HYSTERECTOMY WITH LEFT SALPINGECTOMY;  Surgeon: Burnard Bowers, MD;  Location: [facility];  Service: Gynecology;  Laterality: Left;   TONSILLECTOMY N/A 08/11/2017   Procedure: TONSILLECTOMY;  Surgeon: Jesus Oliphant, MD;  Location: [facility];  Service: ENT;  Laterality: N/A;   UPPER GI ENDOSCOPY     WISDOM TOOTH EXTRACTION      Current Outpatient Medications on File Prior to Visit  Medication Sig Dispense Refill    ARIPiprazole  (ABILIFY ) 2 MG tablet TAKE 1 TABLET BY MOUTH EVERY  MORNING 90 tablet 3   Cholecalciferol (VITAMIN D ) 50 MCG (2000 UT) CAPS Take 2 capsules (4,000 Units total) by mouth daily. 30 capsule 11   Dextromethorphan-buPROPion  ER (AUVELITY ) 45-105 MG TBCR Take 1 tablet by mouth 2 (two) times daily. 60 tablet 1   escitalopram  (LEXAPRO ) 20 MG tablet Take 1.5 tablets (30 mg total) by mouth daily. 135 tablet 1   hydrochlorothiazide  (HYDRODIURIL ) 12.5 MG tablet Take 12.5 mg by mouth every morning.     lidocaine  (XYLOCAINE ) 5 % ointment Apply 1 application topically 3 (three) times daily. 1.25 g 0   LORazepam  (ATIVAN ) 0.5 MG tablet TAKE 1 TABLET(0.5 MG) BY MOUTH EVERY 8 HOURS AS NEEDED FOR ANXIETY 90 tablet 5   pramipexole  (MIRAPEX ) 0.25 MG tablet TAKE 1 TABLET BY MOUTH AT  BEDTIME 90 tablet 3   valACYclovir  (VALTREX ) 500 MG tablet Take 1 tablet (500 mg total) by mouth daily. 90 tablet 3   No current facility-administered medications on file prior to visit.  Allergies  Allergen Reactions   Bee Venom Hives and Swelling   Enablex [Darifenacin Hydrobromide] Other (See Comments)    Phlebitis.   Erythromycin Hives   Macrobid [Nitrofurantoin Monohyd Macro] Nausea And Vomiting   Metronidazole      GI upset with tablet form   Nitrofuran Derivatives Nausea And Vomiting   Risperidone And Paliperidone Swelling      PE Today's Vitals   12/24/23 1109  BP: 124/80  Pulse: 100  Temp: 97.9 F (36.6 C)  TempSrc: Oral  SpO2: 97%  Weight: 236 lb (107 kg)    Body mass index is 46.48 kg/m.  Physical Exam Vitals reviewed. Exam conducted with a chaperone present.  Constitutional:      General: She is not in acute distress.    Appearance: Normal appearance.  HENT:     Head: Normocephalic and atraumatic.     Nose: Nose normal.  Eyes:     Extraocular Movements: Extraocular movements intact.     Conjunctiva/sclera: Conjunctivae normal.  Pulmonary:     Effort: Pulmonary effort is normal.   Genitourinary:    General: Normal vulva.     Exam position: Lithotomy position.     Comments: Bilateral inclusion cyst of vulva, ~56mm or less Musculoskeletal:        General: Normal range of motion.     Cervical back: Normal range of motion.  Neurological:     General: No focal deficit present.     Mental Status: She is alert.  Psychiatric:        Mood and Affect: Mood normal.        Behavior: Behavior normal.       Assessment and Plan:        Inclusion cyst of vulva Small inclusion cysts, patient interested in removal Given size recommend expectant management, warm compress. Reviewed vaginal hygiene. Questions re: HSV-2 and oral sex discussed Encouraged STD testing for both partners, ongoing HSV suppression  Screen for STD (sexually transmitted disease) -     Hepatitis B surface antigen -     Hepatitis C antibody -     RPR -     HIV Antibody (routine testing w rflx) -     C. trachomatis/N. gonorrhoeae RNA  Vera LULLA Pa, MD

## 2023-12-25 LAB — C. TRACHOMATIS/N. GONORRHOEAE RNA
C. trachomatis RNA, TMA: NOT DETECTED
N. gonorrhoeae RNA, TMA: NOT DETECTED

## 2023-12-26 ENCOUNTER — Ambulatory Visit: Payer: Self-pay | Admitting: Obstetrics and Gynecology

## 2023-12-30 ENCOUNTER — Ambulatory Visit (INDEPENDENT_AMBULATORY_CARE_PROVIDER_SITE_OTHER): Admitting: Psychiatry

## 2023-12-30 DIAGNOSIS — F411 Generalized anxiety disorder: Secondary | ICD-10-CM

## 2023-12-30 NOTE — Progress Notes (Signed)
 [facility] Counselor/Therapist Progress Note  Patient ID: Helen Greene, MRN: [id number],    Date: 12/30/2023  Time Spent: 53 minutes   Treatment Type: Individual Therapy  Reported Symptoms: anxiety, depression, sadness, denies any SI     Mental Status Exam:  Appearance:   Casual and Neat     Behavior:  Appropriate and Sharing  Motor:  Normal  Speech/Language:   Clear and Coherent  Affect:  Depressed and anxious, tearful  Mood:  anxious, depressed, and sad  Thought process:  goal directed  Thought content:    Rumination  Sensory/Perceptual disturbances:    WNL  Orientation:  oriented to person, place, time/date, situation, day of week, month of year, year, and stated date of December 30, 2023  Attention:  Good  Concentration:  Fair  Memory:  WNL  Fund of knowledge:   Good  Insight:    Good and Fair  Judgment:   Good and Fair  Impulse Control:  Good and Fair   Risk Assessment: Danger to Self:  No Self-injurious Behavior: No Danger to Others: No Duty to Warn:no Physical Aggression / Violence:No  Access to Firearms a concern: No  Gang Involvement:No   Subjective:   Patient in session and working further today on getting out more to spend time with others, anxiety, depression, sadness, and denies any SI. Need to not hibernate and get out of house more to be around others and states she is working on this. Missing church and that had been a really good place for her and where she had friends. Continues working 2 jobs. Working long hours at day job and night job (4 nights a week) but feels she has to work these hours in order to keep her house. Credit issues cost me more. Still working main job from home, so days are spent alone at home. Working some on her motivation. Still working on some grief after close friend died 09/17/25last yr. Having to say no to other people can be difficult and shared some situations she has had to confront where she had to say no but was  pushed on this, although patient held her ground. Had a second friend who died 04-22-25and continues to work through that loss also. Discussed further today her avoidance of other people including her older sister, and talked through this further today. Looked at ways patient could stop avoiding others, especially those that love her. Looking also at ways for her not to increase her debt which is definitely related to her anxiety, stress, and frustrations.   Interventions: Cognitive Behavioral Therapy, Solution-Oriented/Positive Psychology, and Ego-Supportive Long term goal: Develop the ability to recognize, accept, and cope with feelings of anxiety and  depression. Short term goal: Identify and replace anxious and depressive thinking that leads to anxious or depressive feelings and actions. Replace with more realistic and empowering thoughts.  Strategy: Reinforce patient's positive, reality based cognitive messages that enhance self-confidence and increase adaptive behaviors/action.   Diagnosis:   ICD-10-CM   1. Generalized anxiety disorder  F41.1      Plan:  Patient working in session on her anxiety, managing stress, sadness, stress, depression and some unresolved grief, all of which we spoke about some today. States it helps to talk through her stressors and confirms that she is needing to make better decisions as we talked through some today. Encouraged patient to follow through on the issues we spoke about today and particularly some behavioral changes and  out reach to others that care about her and would want to be supportive. Encouraged patient and her practice of more positive behaviors as noted in sessions including: Trying to work on her health goals for better physical and emotional care, focusing on her grief regarding the unexpected death of close friend within the past year and the death of another close friend soon after, wanting to believe more in herself that she can make  significant changes, wanting to strengthen helpful relationships, improve her self-talk, stay in touch with people who are supportive, saying no when she needs to say no to others without feeling guilty, making positive choices more consistently, and realize the strength she shows when working with goal-directed behaviors to move in a direction that supports her improved emotional health and overall outlook.  Helen Greene has made progress and also has encountered a lot of challenges along the way but she recognizes the need to continue working with goal-directed behaviors to move in a more hopeful, healthier, and positive direction into the future.  Goal review and progress/challenges noted with patient.  Next appointment within 1 month.   Barnie Bunde, LCSW

## 2024-01-06 ENCOUNTER — Other Ambulatory Visit: Payer: Self-pay

## 2024-01-06 MED ORDER — AUVELITY 45-105 MG PO TBCR: 1.0000 | EXTENDED_RELEASE_TABLET | Freq: Two times a day (BID) | ORAL | 1 refills | Status: DC

## 2024-01-12 ENCOUNTER — Encounter: Payer: Self-pay | Admitting: Obstetrics and Gynecology

## 2024-01-12 ENCOUNTER — Ambulatory Visit (INDEPENDENT_AMBULATORY_CARE_PROVIDER_SITE_OTHER): Payer: Self-pay | Admitting: Obstetrics and Gynecology

## 2024-01-12 VITALS — BP 124/82 | HR 90 | Ht 60.5 in | Wt 235.0 lb

## 2024-01-12 DIAGNOSIS — E2839 Other primary ovarian failure: Secondary | ICD-10-CM | POA: Diagnosis not present

## 2024-01-12 DIAGNOSIS — N898 Other specified noninflammatory disorders of vagina: Secondary | ICD-10-CM | POA: Diagnosis not present

## 2024-01-12 DIAGNOSIS — Z01419 Encounter for gynecological examination (general) (routine) without abnormal findings: Secondary | ICD-10-CM

## 2024-01-12 MED ORDER — VALACYCLOVIR HCL 500 MG PO TABS: 500.0000 mg | ORAL_TABLET | Freq: Every day | ORAL | 3 refills | Status: AC

## 2024-01-12 MED ORDER — LIDOCAINE 5 % EX OINT: 1.0000 | TOPICAL_OINTMENT | Freq: Three times a day (TID) | CUTANEOUS | 0 refills | Status: AC

## 2024-01-12 MED ORDER — CHLORHEXIDINE GLUCONATE 4 % EX SOLN: Freq: Every day | CUTANEOUS | 1 refills | Status: AC | PRN
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
## 2024-01-13 ENCOUNTER — Ambulatory Visit (INDEPENDENT_AMBULATORY_CARE_PROVIDER_SITE_OTHER): Admitting: Physician Assistant

## 2024-01-13 LAB — SURESWAB® ADVANCED VAGINITIS PLUS,TMA
C. trachomatis RNA, TMA: NOT DETECTED
CANDIDA SPECIES: NOT DETECTED
Candida glabrata: NOT DETECTED
N. gonorrhoeae RNA, TMA: NOT DETECTED
SURESWAB(R) ADV BACTERIAL VAGINOSIS(BV),TMA: POSITIVE — AB
TRICHOMONAS VAGINALIS (TV),TMA: NOT DETECTED

## 2024-01-14 ENCOUNTER — Ambulatory Visit: Payer: Self-pay | Admitting: Obstetrics and Gynecology

## 2024-01-14 MED ORDER — NYSTATIN 100000 UNIT/GM EX POWD: 1.0000 | Freq: Two times a day (BID) | CUTANEOUS | 0 refills | Status: AC

## 2024-01-14 MED ORDER — METRONIDAZOLE 500 MG PO TABS: 500.0000 mg | ORAL_TABLET | Freq: Two times a day (BID) | ORAL | 0 refills | Status: DC

## 2024-01-14 MED ORDER — CLINDESSE 2 % VA CREA: 1.0000 | TOPICAL_CREAM | Freq: Once | VAGINAL | 0 refills | Status: DC

## 2024-01-14 NOTE — Telephone Encounter (Signed)
 Patient has contraindication to Flagyl .

## 2024-01-14 NOTE — Telephone Encounter (Signed)
 Advise on Flagyl  contraindication

## 2024-01-16 ENCOUNTER — Other Ambulatory Visit: Payer: Self-pay

## 2024-01-16 NOTE — Telephone Encounter (Signed)
 Please clarify the directions for the Clindesse  2% Vaginal cream.   The directions sent were Apply 1 suppository topically 1 time for 1 dose  Pharmacy is asking for the clarification.

## 2024-01-19 ENCOUNTER — Encounter: Payer: Self-pay | Admitting: Physician Assistant

## 2024-01-19 ENCOUNTER — Telehealth: Payer: Self-pay | Admitting: Physician Assistant

## 2024-01-19 MED ORDER — CLINDESSE 2 % VA CREA: 5.0000 g | TOPICAL_CREAM | Freq: Once | VAGINAL | 0 refills | Status: AC

## 2024-01-19 NOTE — Telephone Encounter (Signed)
 Please advise if refill is appropriate

## 2024-01-19 NOTE — Telephone Encounter (Signed)
 Sleep apnea treatment needed. Medical order send to [facility] with sleep study results/ note and patient demographics.

## 2024-01-20 ENCOUNTER — Ambulatory Visit: Payer: Self-pay | Admitting: Obstetrics and Gynecology

## 2024-01-20 MED ORDER — METRONIDAZOLE 0.75 % VA GEL: 1.0000 | Freq: Every day | VAGINAL | 0 refills | Status: AC

## 2024-01-20 NOTE — Telephone Encounter (Signed)
 Spoke with patient, advised per Dr. Glennon.   Patient states she has used Metrolgel in the past with no issues. Reports GI symptoms with oral flagyl . Patient declines boric acid vaginal suppositories.   Advised I will review with Dr. Glennon and f/u, patient agreeable.

## 2024-01-20 NOTE — Telephone Encounter (Signed)
 See second encounter.   Encounter closed.

## 2024-01-20 NOTE — Telephone Encounter (Signed)
 Called [facility] spoke to [place] he will cancel the prescription.

## 2024-01-20 NOTE — Telephone Encounter (Signed)
 Please provide alternative to Clindesse  and Flagyl .

## 2024-01-20 NOTE — Telephone Encounter (Signed)
 Addendum: Solosec also contraindicated with metronidazole  allergy.

## 2024-01-20 NOTE — Addendum Note (Signed)
 Addended by: Lanecia Sliva N on: 01/20/2024 02:06 PM   Modules accepted: Orders

## 2024-01-20 NOTE — Telephone Encounter (Signed)
 Call placed to [facility], spoke with Davon.   Rx clindesse  cancelled.   Metrogel  sent. Patient notified by MyChart.   Encounter closed.

## 2024-01-20 NOTE — Addendum Note (Signed)
 Addended by: Enas Winchel N on: 01/20/2024 02:38 PM   Modules accepted: Orders

## 2024-02-11 ENCOUNTER — Ambulatory Visit (INDEPENDENT_AMBULATORY_CARE_PROVIDER_SITE_OTHER): Admitting: Psychiatry

## 2024-02-11 DIAGNOSIS — F411 Generalized anxiety disorder: Secondary | ICD-10-CM

## 2024-02-11 NOTE — Progress Notes (Signed)
 [facility] Counselor/Therapist Progress Note  Patient ID: Helen Greene, MRN: [id number],    Date: 02/11/2024  Time Spent: 55 minutes   Treatment Type: Individual Therapy  Reported Symptoms: anxiety, frustration, sadness, denies any SI    Mental Status Exam:  Appearance:   Casual     Behavior:  Appropriate, Sharing, and some irritability  Motor:  Normal  Speech/Language:   Clear and Coherent  Affect:  Anxiety, sadness  Mood:  anxious and irritable  Thought process:  goal directed  Thought content:    Rumination  Sensory/Perceptual disturbances:    WNL  Orientation:  oriented to person, place, time/date, situation, day of week, month of year, year, and stated date of Aug.27, 2025  Attention:  Good  Concentration:  Good and Fair  Memory:  WNL  Fund of knowledge:   Good  Insight:    Fair  Judgment:   Good and Fair  Impulse Control:  Good and Fair   Risk Assessment: Danger to Self:  No Self-injurious Behavior: No Danger to Others: No Duty to Warn:no Physical Aggression / Violence:No  Access to Firearms a concern: No  Gang Involvement:No    Subjective:   Patient in session today focusing more on her depression, anxiety, needing to spend more time with others, sadness, but denies any SI.  Has worked on trying to have more connections with others with some success but acknowledges that she needs to focus on this more. Some tearfulness re: this being anniversary date of good friend's death which she processed more in session today. Mixed emotions about his death, and missing him so much even though he may not have always been good for me. Considering getting back into her church and being more active with others that she knows there and some friends who. Is starting a small group Bible Study and starting soon. Trying not to hibernate as much and get out of the house.  Continues to work on motivation, her credit issues, and getting enough rest in between 2 jobs.  Also  encouraged good choices and friendships and in setting limits where she needs to with other people.  Working more on learning to love herself.   Interventions: Cognitive Behavioral Therapy Long term goal: Develop the ability to recognize, accept, and cope with feelings of anxiety and  depression. Short term goal: Identify and replace anxious and depressive thinking that leads to anxious or depressive feelings and actions. Replace with more realistic and empowering thoughts.  Strategy: Reinforce patient's positive, reality based cognitive messages that enhance self-confidence and increase adaptive behaviors/action.   Diagnosis:   ICD-10-CM   1. Generalized anxiety disorder  F41.1      Plan:    Patient focusing further today on her anxiety, sadness, stress management, depression, and some unresolved grief, which we focused on in session today as today was an anniversary date for one of her friends that had died a year ago.  Initially very tearful as she talked through her sense of loss and missing the individual, and was able to gradually get to a place focusing more on the joy of having had him in her life versus the pain of missing him at this point.  Also working on making better decisions which she has made some progress, but acknowledges it is very hard to stick to at times.  Encouraged her to keep working with her strategies and not look just at the things where she feels like she does not handle  well but also see the things that she does handle well.  Encouraged patient as she tries to practice more positive behaviors as noted in sessions including: Trying to work on her health goals for better physical and emotional health, focusing on her grief regarding the unexpected death of close friend within the past year and the death of another close friend soon after, wanting to believe more in herself that she can make significant changes, wanting to strengthen the helpful relationships that she  has, wanting to improve her self-talk, stay in touch with people who are supportive, saying no when she needs to say no to others without feeling guilty, making positive choices more consistently, and recognize the strength she shows when working with goal-directed behaviors to move in a direction that supports her improved emotional health and overall outlook.  Helen Greene continues to work to make progress and spite of a lot of challenges along the way and she does recognize the need to continue working with goal-directed behaviors aiming to move in a more hopeful, healthier, and more grounded/positive direction into the future.  Goal review and progress/challenges noted with patient.  Next appointment within 1 month.   Barnie Bunde, LCSW

## 2024-02-16 ENCOUNTER — Other Ambulatory Visit: Payer: Self-pay | Admitting: Physician Assistant

## 2024-02-19 ENCOUNTER — Ambulatory Visit (INDEPENDENT_AMBULATORY_CARE_PROVIDER_SITE_OTHER): Admitting: Physician Assistant

## 2024-02-19 ENCOUNTER — Encounter: Payer: Self-pay | Admitting: Physician Assistant

## 2024-02-19 DIAGNOSIS — F3341 Major depressive disorder, recurrent, in partial remission: Secondary | ICD-10-CM | POA: Diagnosis not present

## 2024-02-19 DIAGNOSIS — G4733 Obstructive sleep apnea (adult) (pediatric): Secondary | ICD-10-CM

## 2024-02-19 DIAGNOSIS — R400 Somnolence: Secondary | ICD-10-CM

## 2024-02-19 DIAGNOSIS — F411 Generalized anxiety disorder: Secondary | ICD-10-CM | POA: Diagnosis not present

## 2024-02-19 DIAGNOSIS — G2581 Restless legs syndrome: Secondary | ICD-10-CM | POA: Diagnosis not present

## 2024-02-19 DIAGNOSIS — R5381 Other malaise: Secondary | ICD-10-CM

## 2024-02-19 MED ORDER — AUVELITY 45-105 MG PO TBCR: 1.0000 | EXTENDED_RELEASE_TABLET | Freq: Two times a day (BID) | ORAL | 1 refills | Status: AC

## 2024-02-19 NOTE — Progress Notes (Signed)
 [facility] Med Check  Patient ID: Helen Greene,  MRN: [id number]  PCP: Sun, Vyvyan, MD  Date of Evaluation: 02/19/2024 Time spent:20 minutes  Chief Complaint:  Chief Complaint   Anxiety; Follow-up; Depression    HISTORY/CURRENT STATUS: For routine med check.  We increased the Lexapro  at LOV.  Thinks it's helped some.  She is more able to enjoy things.  Energy and motivation are fair.  Work is going well.   No extreme sadness, tearfulness, or feelings of hopelessness.  ADLs and personal hygiene are normal.   Denies any changes in concentration, making decisions, or remembering things.  Appetite has not changed.  Anxiety is controlled.  Takes Ativan  occas.  No mania, delirium, AH/VH.  No SI/HI.  Had sleep study, showed OSA.  Waiting on CPAP. Still feels tired a lot, non-restorative sleep.   Individual Medical History/ Review of Systems: Changes? :No      Past medications for mental health diagnoses include: Wellbutrin  XL, Risperdal caused edema, Lexapro , Mirapex , Vistaril , questionably Xanax , Abilify , Lamictal , Klonopin, pramipexole  for restless legs, Celexa , Lamictal , Cymbalta  ineffective, amitriptyline   Allergies: Bee venom, Enablex [darifenacin hydrobromide], Erythromycin, Macrobid [nitrofurantoin monohyd macro], Metronidazole , Nitrofuran derivatives, and Risperidone and paliperidone  Current Medications:  Current Outpatient Medications:    ARIPiprazole  (ABILIFY ) 2 MG tablet, TAKE 1 TABLET BY MOUTH EVERY  MORNING, Disp: 90 tablet, Rfl: 3   chlorhexidine  (HIBICLENS ) 4 % external liquid, Apply topically daily as needed. Apply to warm wash cloth to boil twice daily for 7 days, Disp: 8 mL, Rfl: 1   Cholecalciferol (VITAMIN D ) 50 MCG (2000 UT) CAPS, Take 2 capsules (4,000 Units total) by mouth daily., Disp: 30 capsule, Rfl: 11   escitalopram  (LEXAPRO ) 20 MG tablet, Take 1.5 tablets (30 mg total) by mouth daily., Disp: 135 tablet, Rfl: 1   hydrochlorothiazide  (HYDRODIURIL ) 12.5 MG  tablet, Take 12.5 mg by mouth every morning., Disp: , Rfl:    lidocaine  (XYLOCAINE ) 5 % ointment, Apply 1 Application topically 3 (three) times daily., Disp: 1.25 g, Rfl: 0   LORazepam  (ATIVAN ) 0.5 MG tablet, TAKE 1 TABLET(0.5 MG) BY MOUTH EVERY 8 HOURS AS NEEDED FOR ANXIETY, Disp: 90 tablet, Rfl: 5   pramipexole  (MIRAPEX ) 0.25 MG tablet, TAKE 1 TABLET BY MOUTH AT  BEDTIME, Disp: 90 tablet, Rfl: 3   valACYclovir  (VALTREX ) 500 MG tablet, Take 1 tablet (500 mg total) by mouth daily., Disp: 90 tablet, Rfl: 3   Dextromethorphan-buPROPion  ER (AUVELITY ) 45-105 MG TBCR, Take 1 tablet by mouth 2 (two) times daily., Disp: 180 tablet, Rfl: 1 Medication Side Effects: none  Family Medical/ Social History: Changes?  See HPI  MENTAL HEALTH EXAM:  Last menstrual period 09/07/2015.There is no height or weight on file to calculate BMI.  General Appearance: Casual, Neat, Well Groomed and Obese  Eye Contact:  Good  Speech:  Clear and Coherent and Normal Rate  Volume:  Normal  Mood:  Euthymic  Affect:  Congruent  Thought Process:  Goal Directed and Descriptions of Associations: Intact  Orientation:  Full (Time, Place, and Person)  Thought Content: Logical   Suicidal Thoughts:  No  Homicidal Thoughts:  No  Memory:  WNL  Judgement:  Good  Insight:  Good  Psychomotor Activity:  Normal  Concentration:  Concentration: Good and Attention Span: Good  Recall:  Good  Fund of Knowledge: Good  Language: Good  Assets:  Communication Skills Desire for Improvement Financial Resources/Insurance Housing Transportation Vocational/Educational  ADL's:  Intact  Cognition: WNL  Prognosis:  Good  Her PCP follows her labs.  Sleep study results are on chart.  +OSA  DIAGNOSES:    ICD-10-CM   1. Obstructive sleep apnea  G47.33     2. 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 Malaise and fatigue  R53.81    R53.83     4. Generalized anxiety disorder  F41.1     5. Restless leg  syndrome  G25.81     6. Daytime somnolence  R40.0      Receiving Psychotherapy: Yes  With Marval Bunde, LCSW.  RECOMMENDATIONS:  PDMP was reviewed.  Lorazepam  filled 02/05/2023. I provided approximately 20 minutes of face to face time during this encounter, including time spent before and after the visit in records review, medical decision making, counseling pertinent to today's visit, and charting.   She is doing well as far as her mood goes.  No changes in treatment are needed.  We are waiting on the CPAP.  Double-checking on the status today.   Continue Abilify  2 mg every morning. Continue Auvelity  45-105 mg bid.  Continue Lexapro  20 mg, 1.5 pills daily.  Continue Ativan  0.5 mg every 8 hours as needed anxiety. Continue pramipexole  0.25 mg nightly. Continue vitamin D  2000 IUs, 2 p.o. daily, MVI, B Complex. Continue therapy with Marval Bunde, LCSW. Return in 3 months.   Verneita Cooks, PA-C

## 2024-02-20 ENCOUNTER — Encounter: Payer: Self-pay | Admitting: Obstetrics and Gynecology

## 2024-02-24 MED ORDER — FLUCONAZOLE 200 MG PO TABS: 200.0000 mg | ORAL_TABLET | Freq: Once | ORAL | 0 refills | Status: AC

## 2024-02-24 MED ORDER — NYSTATIN 100000 UNIT/GM EX POWD: 1.0000 | Freq: Two times a day (BID) | CUTANEOUS | 2 refills | Status: AC

## 2024-03-03 ENCOUNTER — Ambulatory Visit: Admitting: Psychiatry

## 2024-03-03 DIAGNOSIS — F411 Generalized anxiety disorder: Secondary | ICD-10-CM | POA: Diagnosis not present
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
## 2024-03-30 ENCOUNTER — Ambulatory Visit: Admitting: Psychiatry

## 2024-04-06 ENCOUNTER — Ambulatory Visit: Admitting: Psychiatry

## 2024-04-07 ENCOUNTER — Telehealth: Payer: Self-pay | Admitting: Physician Assistant

## 2024-04-07 MED ORDER — ESCITALOPRAM OXALATE 20 MG PO TABS: 30.0000 mg | ORAL_TABLET | Freq: Every day | ORAL | 0 refills | Status: DC

## 2024-04-07 NOTE — Telephone Encounter (Signed)
 Sent 10 day supply as requested

## 2024-04-07 NOTE — Telephone Encounter (Signed)
 Azriel called and said that she needs a10 day supply of her escitalopram  20 mg sent to her nlocal pharmacy. Pharmacy is [facility] on [address]. She is waiting for mail order to send her 90 day supply.

## 2024-05-04 ENCOUNTER — Ambulatory Visit: Admitting: Psychiatry

## 2024-05-06 ENCOUNTER — Other Ambulatory Visit (HOSPITAL_COMMUNITY): Payer: Self-pay | Admitting: Sports Medicine

## 2024-05-06 ENCOUNTER — Telehealth: Payer: Self-pay | Admitting: Physician Assistant

## 2024-05-06 DIAGNOSIS — M545 Low back pain, unspecified: Secondary | ICD-10-CM

## 2024-05-06 NOTE — Telephone Encounter (Signed)
 Resent CPAP supply order to [facility] on 11/10  b/c it was not previously received. Called [facility] today and they have received order and pt is needing to schedule an appt to go get fitted. Gave patient information for [facility] so she can call.

## 2024-05-06 NOTE — Telephone Encounter (Signed)
 Noted

## 2024-05-12 ENCOUNTER — Ambulatory Visit (INDEPENDENT_AMBULATORY_CARE_PROVIDER_SITE_OTHER): Admitting: Psychiatry

## 2024-05-12 DIAGNOSIS — F411 Generalized anxiety disorder: Secondary | ICD-10-CM

## 2024-05-12 NOTE — Progress Notes (Signed)
 [facility] Counselor/Therapist Progress Note  Patient ID: Helen Greene, MRN: [id number],    Date: 05/12/2024  Time Spent: 48 minutes   Treatment Type: Individual Therapy  Reported Symptoms: anxiety, frustration esp at work, some grief, no SI   Mental Status Exam:  Appearance:   Casual     Behavior:  Appropriate, Sharing, and Motivated  Motor:  Normal  Speech/Language:   Clear and Coherent  Affect:  Depressed and anxious  Mood:  anxious and depressed  Thought process:  goal directed  Thought content:    Rumination  Sensory/Perceptual disturbances:    WNL  Orientation:  oriented to person, place, time/date, situation, day of week, month of year, year, and stated date of Nov. 26, 2025  Attention:  Good  Concentration:  Good and Fair  Memory:  WNL  Fund of knowledge:   Good  Insight:    Good and Fair  Judgment:   Good and Fair  Impulse Control:  Good   Risk Assessment: Danger to Self:  No Self-injurious Behavior: No Danger to Others: No Duty to Warn:no Physical Aggression / Violence:No  Access to Firearms a concern: No  Gang Involvement:No   Subjective:   Patient today in session working further on her depression, anxiety, sadness, making healthier decisions, and her need to spend more time with others. Anxiety is my stronger symptom. Anxious about some physical issues and to have a back MRI.  Is also doing PT exercises which has helped some. Processed some work related issues re: boundaries and patient's own self-care and safety concerns. Has followed up on some of her desire to get out more socially including a birthday party, going to a church where a friend is on staff, and an early Thanksgiving event with friends, and also spending some time with a half-sister. 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 Self-esteem she states is a little better and is a work in progress. Is getting a c-pap machine and is hoping that will help her sleep  and energy. Also discussed boundary issues in session today and name certain boundaries that are needed with certain people in her life. Encouraged more positive decision-making and healthier and more consistent boundaries. Trying to make healthier decisions but finding it hard and discussed this further in session today. Also trying to better manage some difficulty within family relationships and is trying to manage some certain situations more carefully. Wanting/needing to love herself more, take better care of herself especially with getting enough sleep and having more realistic expectations of herself.    Interventions: Cognitive Behavioral Therapy, Solution-Oriented/Positive Psychology, and Ego-Supportive Long term goal: Develop the ability to recognize, accept, and cope with feelings of anxiety and  depression. Short term goal: Identify and replace anxious and depressive thinking that leads to anxious or depressive feelings and actions. Replace with more realistic and empowering thoughts.  Strategy: Reinforce patient's positive, reality based cognitive messages that enhance self-confidence and increase adaptive behaviors/action.  Diagnosis:   ICD-10-CM   1. Generalized anxiety disorder  F41.1      Plan:   Patient today showing good effort in session working further on her stress management, sadness, depression, anxiety, trying to improve her self-care, making healthier decisions/choices, unresolved grief, and working to improve her self-esteem.  I continue to encourage patient not just to focus on her problem areas and to be more mindful and recognize some of her positives that she could work on increasing  and particularly in making healthy decisions. Progressing with her grief. Does feel some progress has been made within family but still is a challenge. Continue to encourage patient to be practicing more positive behaviors as noted in sessions including: Working more on her health goals  for better physical and emotional health, focusing on her grief regarding the unexpected death of close friend within the past year and the death of another close friend soon after that, wanting to believe more in herself that she can make significant positive changes, wanting to strengthen the helpful relationships that she does have, hoping to improve her self-talk, staying in touch with people who are supportive, saying no when she needs to say no to others without feeling guilty, making positive choices more consistently, and recognize the strengths she can show when working with goal-directed behaviors to move in a direction that best supports her improved overall emotional health and outlook. Helen Greene has made some progress and spite of a lot of challenges along the way and does recognize the need for her to continue working with her goal-directed behaviors so as to be able to move forward in a more hopeful, healthier, and more grounded/positive direction going forward.  Goal review and progress/challenges noted with patient.  Next appointment within 1 month.   Barnie Bunde, LCSW

## 2024-05-14 ENCOUNTER — Ambulatory Visit (HOSPITAL_COMMUNITY): Admission: RE | Admit: 2024-05-14 | Source: Ambulatory Visit

## 2024-05-15 ENCOUNTER — Ambulatory Visit (HOSPITAL_COMMUNITY)
Admission: RE | Admit: 2024-05-15 | Discharge: 2024-05-15 | Disposition: A | Discharge status: Home / Self Care | Source: Ambulatory Visit | Attending: Sports Medicine | Admitting: Sports Medicine

## 2024-05-15 DIAGNOSIS — M545 Low back pain, unspecified: Secondary | ICD-10-CM | POA: Diagnosis present

## 2024-05-20 ENCOUNTER — Ambulatory Visit (INDEPENDENT_AMBULATORY_CARE_PROVIDER_SITE_OTHER): Admitting: Physician Assistant

## 2024-05-20 ENCOUNTER — Encounter: Payer: Self-pay | Admitting: Physician Assistant

## 2024-05-20 DIAGNOSIS — F3341 Major depressive disorder, recurrent, in partial remission: Secondary | ICD-10-CM | POA: Diagnosis not present

## 2024-05-20 DIAGNOSIS — F411 Generalized anxiety disorder: Secondary | ICD-10-CM

## 2024-05-20 DIAGNOSIS — G2581 Restless legs syndrome: Secondary | ICD-10-CM | POA: Diagnosis not present

## 2024-05-20 DIAGNOSIS — G4733 Obstructive sleep apnea (adult) (pediatric): Secondary | ICD-10-CM | POA: Diagnosis not present

## 2024-05-20 MED ORDER — ARIPIPRAZOLE 2 MG PO TABS: 2.0000 mg | ORAL_TABLET | Freq: Every morning | ORAL | 3 refills | Status: AC

## 2024-05-20 MED ORDER — LORAZEPAM 0.5 MG PO TABS: 0.5000 mg | ORAL_TABLET | Freq: Three times a day (TID) | ORAL | 0 refills | Status: AC | PRN

## 2024-05-20 MED ORDER — PRAMIPEXOLE DIHYDROCHLORIDE 0.25 MG PO TABS: 0.2500 mg | ORAL_TABLET | Freq: Every day | ORAL | 3 refills | Status: AC

## 2024-05-20 MED ORDER — ESCITALOPRAM OXALATE 20 MG PO TABS: 30.0000 mg | ORAL_TABLET | Freq: Every day | ORAL | 3 refills | Status: AC

## 2024-05-20 NOTE — Progress Notes (Signed)
 [facility] Med Check  Patient ID: Helen Greene,  MRN: [id number]  PCP: Sun, Vyvyan, MD  Date of Evaluation: 05/20/2024 Time spent:20 minutes  Chief Complaint:  Chief Complaint   Depression; Anxiety; Insomnia; Follow-up    HISTORY/CURRENT STATUS: For routine med check.  Finally got her CPAP and has been using for 1 week. She can tell it's making a difference. RLS sx are well-treated.   This time of year is hard for her.  Not sure what to expect from her sons for Christmas get together.  But overall, states her mood is good.  Energy and motivation are better since she feels more rested.  Work is going well.   No extreme sadness, tearfulness, or feelings of hopelessness. ADLs and personal hygiene are normal.   No change in memory. Appetite has not changed.  Weight is stable.  Anxiety is well-controlled.  She rarely takes the Ativan  but it is helpful when needed.  She has not had it filled in over a year.  No mania, delirium, AH/VH.  No SI/HI.  Individual Medical History/ Review of Systems: Changes? :No      Past medications for mental health diagnoses include: Wellbutrin  XL, Risperdal caused edema, Lexapro , Mirapex , Vistaril , questionably Xanax , Abilify , Lamictal , Klonopin, pramipexole  for restless legs, Celexa , Lamictal , Cymbalta  ineffective, amitriptyline   Allergies: Bee venom, Enablex [darifenacin hydrobromide], Erythromycin, Macrobid [nitrofurantoin monohyd macro], Metronidazole , Nitrofuran derivatives, and Risperidone and paliperidone  Current Medications:  Current Outpatient Medications:    Cholecalciferol (VITAMIN D ) 50 MCG (2000 UT) CAPS, Take 2 capsules (4,000 Units total) by mouth daily., Disp: 30 capsule, Rfl: 11   Dextromethorphan-buPROPion  ER (AUVELITY ) 45-105 MG TBCR, Take 1 tablet by mouth 2 (two) times daily., Disp: 180 tablet, Rfl: 1   lidocaine  (XYLOCAINE ) 5 % ointment, Apply 1 Application topically 3 (three) times daily., Disp: 1.25 g, Rfl: 0   valACYclovir   (VALTREX ) 500 MG tablet, Take 1 tablet (500 mg total) by mouth daily., Disp: 90 tablet, Rfl: 3   ARIPiprazole  (ABILIFY ) 2 MG tablet, Take 1 tablet (2 mg total) by mouth every morning., Disp: 90 tablet, Rfl: 3   chlorhexidine  (HIBICLENS ) 4 % external liquid, Apply topically daily as needed. Apply to warm wash cloth to boil twice daily for 7 days, Disp: 8 mL, Rfl: 1   escitalopram  (LEXAPRO ) 20 MG tablet, Take 1.5 tablets (30 mg total) by mouth daily., Disp: 135 tablet, Rfl: 3   hydrochlorothiazide  (HYDRODIURIL ) 12.5 MG tablet, Take 12.5 mg by mouth every morning. (Patient not taking: Reported on 05/20/2024), Disp: , Rfl:    LORazepam  (ATIVAN ) 0.5 MG tablet, Take 1 tablet (0.5 mg total) by mouth every 8 (eight) hours as needed for anxiety., Disp: 90 tablet, Rfl: 0   pramipexole  (MIRAPEX ) 0.25 MG tablet, Take 1 tablet (0.25 mg total) by mouth at bedtime., Disp: 90 tablet, Rfl: 3 Medication Side Effects: none  Family Medical/ Social History: Changes?  no  MENTAL HEALTH EXAM:  Last menstrual period 09/07/2015.There is no height or weight on file to calculate BMI.  General Appearance: Casual, Neat, Well Groomed and Obese  Eye Contact:  Good  Speech:  Clear and Coherent and Normal Rate  Volume:  Normal  Mood:  Euthymic  Affect:  Congruent  Thought Process:  Goal Directed and Descriptions of Associations: Intact  Orientation:  Full (Time, Place, and Person)  Thought Content: Logical   Suicidal Thoughts:  No  Homicidal Thoughts:  No  Memory:  WNL  Judgement:  Good  Insight:  Good  Psychomotor Activity:  Normal  Concentration:  Concentration: Good and Attention Span: Good  Recall:  Good  Fund of Knowledge: Good  Language: Good  Assets:  Communication Skills Desire for Improvement Financial Resources/Insurance Housing Physical Health Resilience Transportation Vocational/Educational  ADL's:  Intact  Cognition: WNL  Prognosis:  Good   Her PCP follows her labs.  Sleep study results are  on chart.  +OSA  DIAGNOSES:    ICD-10-CM   1. Generalized anxiety disorder  F41.1     2. 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 Obstructive sleep apnea  G47.33     4. Restless leg syndrome  G25.81      Receiving Psychotherapy: Yes  With Marval Bunde, LCSW.  RECOMMENDATIONS:  PDMP was reviewed.  Lorazepam  filled 02/05/2023. I provided approximately 20 minutes of face to face time during this encounter, including time spent before and after the visit in records review, medical decision making, counseling pertinent to today's visit, and charting.   She's doing well on the current meds so no changes are needed.  It is good to know she is feeling better since she started using the CPAP.  Continue Abilify  2 mg every morning. Continue Auvelity  45-105 mg bid.  Continue Lexapro  20 mg, 1.5 pills daily.  Continue Ativan  0.5 mg every 8 hours as needed anxiety. Continue pramipexole  0.25 mg nightly. Continue vitamin D  2000 IUs, 2 p.o. daily, MVI, B Complex. Continue therapy with Marval Bunde, LCSW. Continue CPAP use. Return in 6 months.   Verneita Cooks, PA-C

## 2024-06-01 ENCOUNTER — Ambulatory Visit: Admitting: Psychiatry

## 2024-06-01 DIAGNOSIS — F411 Generalized anxiety disorder: Secondary | ICD-10-CM | POA: Diagnosis not present

## 2024-06-01 NOTE — Progress Notes (Signed)
 [facility] Counselor/Therapist Progress Note  Patient ID: Helen Greene, MRN: [id number],    Date: 06/01/2024  Time Spent: 53 minutes   Treatment Type: Individual Therapy  Reported Symptoms:   Anxiety, some grief, some depression, frustration with work    Mental Status Exam:  Appearance:   Casual and Neat     Behavior:  Appropriate, Sharing, and Motivated  Motor:  Normal  Speech/Language:   Clear and Coherent  Affect:  Depressed and anxious  Mood:  anxious and depressed  Thought process:  goal directed  Thought content:    Rumination  Sensory/Perceptual disturbances:    WNL  Orientation:  oriented to person, place, time/date, situation, day of week, month of year, year, and stated date of Dec. 16, 2025  Attention:  Fair  Concentration:  Good  Memory:  WNL  Fund of knowledge:   Good  Insight:    Good and Fair  Judgment:   Good and Fair  Impulse Control:  Good   Risk Assessment: Danger to Self:  No Self-injurious Behavior: No Danger to Others: No Duty to Warn:no Physical Aggression / Violence:No  Access to Firearms a concern: No  Gang Involvement:No   Subjective:   Patient working in session today on her anxiety, depression, some sadness, making healthier decisions and trying to make more time to be around other people that are healthy for her. Following up on her anxiety and sadness  in session today, noting that the holidays are challenging at times and is noticing this more recently. Some tearfulness as she talked about relationships, family and friend losses due to death especially the loss of good friend which she processed more in session tonight, including anniversary date of the death. Work stressors addressed in session especially certain tasks at work (in her second job) where it's hard for patient to maneuver around and get customers served due to volume of customers and being understaffed. Still having back issues which is impacting her anxiety also, and  has been referred to a spine doctor. Getting out a little more recently including with other people and church activities. Trying to keep healthier boundaries. Self-esteem a work in progress and focused more on this today in session. Positive decision-making encouraged and reviewed more today. Working to love herself more and take better overall care of herself, including good decision-making as discussed today.   Interventions: Cognitive Behavioral Therapy, Solution-Oriented/Positive Psychology, and Ego-Supportive Long term goal: Develop the ability to recognize, accept, and cope with feelings of anxiety and  depression. Short term goal: Identify and replace anxious and depressive thinking that leads to anxious or depressive feelings and actions. Replace with more realistic and empowering thoughts.  Strategy: Reinforce patient's positive, reality based cognitive messages that enhance self-confidence and increase adaptive behaviors/action.   Diagnosis:   ICD-10-CM   1. Generalized anxiety disorder  F41.1      Plan:    Patient working in session today focusing further on her anxiety, sadness, some depression, stress management, unresolved grief, improving her self-care, elevating her self-esteem, and making healthier decisions/choices.  The symptoms do cover a broad area however patient wanted to follow through in this way and we were able to work in session and ways that touched on all of these issues, focusing more on her anxiety, choices, and believing more in herself.  Noticing some progress and also some need areas that she will be continuing to work on between now and next session.  Working on being more  mindful and recognizing her positives as well as her challenges.  Patient states that she does feel she is making some progress and feels motivated to continue. Encouraged patient to be practicing her positive behaviors as we noted in sessions including: Working further on her health goals  for better physical and emotional health, focusing on her grief regarding the unexpected death of a close friend within the past year and the death of another close friend soon after that 1, wanting to believe more in herself that she can make significant positive changes, wanting to strengthen the helpful relationships that she does have, hoping to improve her self-talk, staying in touch with people who are supportive, saying no when she needs to say no to others without feeling guilty, making positive choices more consistently, and recognize the strengths she can show when working with goal-directed behaviors to move in a direction that best supports her improved overall emotional health and outlook.  Helen Greene is working to make further progress although does have a lot of challenges in her life and does recognize the need for her to continue working with goal-directed behaviors to be able to move forward and a more healthier, hopeful, and positive direction into the future.  Goal review and progress/challenges noted with patient.  Next appointment within   Barnie Bunde, LCSW

## 2024-06-09 ENCOUNTER — Encounter (HOSPITAL_BASED_OUTPATIENT_CLINIC_OR_DEPARTMENT_OTHER): Payer: Self-pay | Admitting: Emergency Medicine

## 2024-06-09 ENCOUNTER — Other Ambulatory Visit: Payer: Self-pay

## 2024-06-09 ENCOUNTER — Emergency Department (HOSPITAL_BASED_OUTPATIENT_CLINIC_OR_DEPARTMENT_OTHER)
Admission: EM | Admit: 2024-06-09 | Discharge: 2024-06-09 | Disposition: A | Discharge status: Home / Self Care | Attending: Emergency Medicine | Admitting: Emergency Medicine

## 2024-06-09 DIAGNOSIS — K1379 Other lesions of oral mucosa: Secondary | ICD-10-CM | POA: Insufficient documentation

## 2024-06-09 DIAGNOSIS — J029 Acute pharyngitis, unspecified: Secondary | ICD-10-CM | POA: Diagnosis present

## 2024-06-09 LAB — GROUP A STREP BY PCR: Group A Strep by PCR: NOT DETECTED

## 2024-06-09 NOTE — Discharge Instructions (Signed)
 Strep test was negative today.  Exam was overall reassuring.  I have provided you with information on thrush if you do experience any worsening symptoms.  If you begin to experience fever, shortness of breath, difficulty swallowing, sore throat, or notice excessive amount of white spots spreading throughout your mouth please return to the ED or primary care for further evaluation.  If symptoms do not resolve in the next several days please follow-up with a primary care provider or your sleep medicine provider to discuss symptoms.

## 2024-06-09 NOTE — ED Triage Notes (Signed)
 Noticed white spots in mouth Irritated/ sore throat Noticed yesterday

## 2024-06-09 NOTE — ED Provider Notes (Signed)
" [facility] AT [facility] Provider Note   CSN: [id number] Arrival date & time: 06/09/24  1543     Patient presents with: Sore Throat   Helen Greene is a 53 y.o. female.  53 year old female presents to the ED with complaints of white spots throughout her mouth and she was concerned that she had strep throat.  She reports approximately 4 days ago she noted a couple small white spots in her cheek, she ate a new crunchier cereal and thought that it was that causing the white spots and stopped eating the cereal.  She advises that she started to see more of the white dots throughout her mouth over the next couple days and today decided to go to the doctor to rule out strep before the holidays.  Patient denies any sore throat, difficulty swallowing or pain with swallowing, shortness of breath, fevers, congestion, ear fullness, pain throughout mouth anxiety, or recent illnesses.  Patient did not use steroids and does not use inhalers.  Patient has significant history of anxiety, hypertension, GERD, comfortable calculus.  Patient manage he recently started CPAP machine around Thanksgiving and was questioning if that could be causing some of the symptoms.  She reports she does not clean her machine as often as she should but she does change her water frequently with distilled water.    Prior to Admission medications  Medication Sig Start Date End Date Taking? Authorizing Provider  ARIPiprazole  (ABILIFY ) 2 MG tablet Take 1 tablet (2 mg total) by mouth every morning. 05/20/24   Rhys Boyer T, PA-C  chlorhexidine  (HIBICLENS ) 4 % external liquid Apply topically daily as needed. Apply to warm wash cloth to boil twice daily for 7 days 01/12/24   Glennon Almarie POUR, MD  Cholecalciferol (VITAMIN D ) 50 MCG (2000 UT) CAPS Take 2 capsules (4,000 Units total) by mouth daily. 01/28/21   Rhys Boyer DASEN, PA-C  Dextromethorphan-buPROPion  ER (AUVELITY ) 45-105 MG TBCR Take 1 tablet by  mouth 2 (two) times daily. 02/19/24   Rhys Boyer T, PA-C  escitalopram  (LEXAPRO ) 20 MG tablet Take 1.5 tablets (30 mg total) by mouth daily. 05/20/24   Rhys Boyer T, PA-C  hydrochlorothiazide  (HYDRODIURIL ) 12.5 MG tablet Take 12.5 mg by mouth every morning. Patient not taking: Reported on 05/20/2024 03/14/23   [provider]  lidocaine  (XYLOCAINE ) 5 % ointment Apply 1 Application topically 3 (three) times daily. 01/12/24   Glennon Almarie POUR, MD  LORazepam  (ATIVAN ) 0.5 MG tablet Take 1 tablet (0.5 mg total) by mouth every 8 (eight) hours as needed for anxiety. 05/20/24   Rhys Boyer T, PA-C  pramipexole  (MIRAPEX ) 0.25 MG tablet Take 1 tablet (0.25 mg total) by mouth at bedtime. 05/20/24   Rhys Boyer T, PA-C  valACYclovir  (VALTREX ) 500 MG tablet Take 1 tablet (500 mg total) by mouth daily. 01/12/24   Glennon Almarie POUR, MD    Allergies: Bee venom, Enablex [darifenacin hydrobromide], Erythromycin, Macrobid [nitrofurantoin monohyd macro], Metronidazole , Nitrofuran derivatives, and Risperidone and paliperidone    Review of Systems  HENT:  Negative for congestion and sore throat.   All other systems reviewed and are negative.   Updated Vital Signs BP (!) 160/80 (BP Location: Right Arm)   Pulse 95   Temp 98.1 F (36.7 C)   Resp 17   LMP 09/07/2015   SpO2 93%   Physical Exam Vitals and nursing note reviewed.  Constitutional:      General: She is not in acute distress.  Appearance: Normal appearance. She is well-developed. She is not ill-appearing.  HENT:     Head: Normocephalic and atraumatic.     Right Ear: No drainage.     Left Ear: No drainage.     Nose: Nose normal. No congestion or rhinorrhea.     Mouth/Throat:     Mouth: Oral lesions present.     Pharynx: No pharyngeal swelling, oropharyngeal exudate, posterior oropharyngeal erythema or uvula swelling.     Tonsils: No tonsillar exudate or tonsillar abscesses. 0 on the right. 0 on the left.     Comments: 3  small white spots that are removable with tongue depressor on anterior portion of cheek. Eyes:     Extraocular Movements: Extraocular movements intact.     Conjunctiva/sclera: Conjunctivae normal.     Pupils: Pupils are equal, round, and reactive to light.  Neck:     Comments: No lymphadenopathy. Cardiovascular:     Rate and Rhythm: Normal rate.  Pulmonary:     Effort: Pulmonary effort is normal. No respiratory distress.  Abdominal:     General: There is no distension.     Palpations: Abdomen is soft.     Tenderness: There is no abdominal tenderness.  Musculoskeletal:        General: Normal range of motion.     Cervical back: Normal range of motion.  Skin:    General: Skin is warm.     Capillary Refill: Capillary refill takes less than 2 seconds.  Neurological:     General: No focal deficit present.     Mental Status: She is alert.  Psychiatric:        Mood and Affect: Mood normal.        Behavior: Behavior normal.     (all labs ordered are listed, but only abnormal results are displayed) Labs Reviewed  GROUP A STREP BY PCR    EKG: None  Radiology: No results found.   Procedures   Medications Ordered in the ED - No data to display  53 y.o. female presents to the ED with complaints of white spots in mouth, The differential diagnosis includes thrush, strep throat, viral illness, tonsillitis (Ddx)  On arrival pt is nontoxic, vitals unremarkable. Exam significant for 3 small white spots on the left lower cheek in between the gum and the inside of the cheek.  They were able to be removed with tongue depressor.  Lab Tests:  Strep which was negative  ED Course:   Patient is sitting comfortably in ED bed in no acute distress nontoxic-appearing.  Patient advises she was to be sure she does not have strep before the holidays.  Patient denies any other symptoms besides a few small white spots in her mouth.  Exam is overall unremarkable.  There is low suspicion for  bacterial or viral illness at this time.  Patient has a Centor score of 0.  Patient is low risk for so she does not take steroids and does not have any inhalers.  Patient denies any autoimmune disease or immune suppressing therapies.  Patient was advised of symptoms to monitor for and return precautions and agreed with treatment plan.  Patient was comfortable with discharge at this time.  Portions of this note were generated with Scientist, clinical (histocompatibility and immunogenetics). Dictation errors may occur despite best attempts at proofreading.   Final diagnoses:  Mouth sores    ED Discharge Orders     None          Myriam Fonda RAMAN, PA-C  06/09/24 1909 ° °"

## 2024-06-29 ENCOUNTER — Ambulatory Visit: Admitting: Psychiatry

## 2024-06-29 DIAGNOSIS — F4323 Adjustment disorder with mixed anxiety and depressed mood: Secondary | ICD-10-CM | POA: Diagnosis not present

## 2024-06-29 NOTE — Progress Notes (Signed)
 "       [facility] Counselor/Therapist Progress Note  Patient ID: Helen Greene, MRN: [id number],    Date: 06/29/2024  Time Spent: 60 minutes   Treatment Type: Individual Therapy  Reported Symptoms: anxiety, some depression, work frustrations    Mental Status Exam:  Appearance:   Casual     Behavior:  Appropriate, Sharing, and Motivated  Motor:  Normal  Speech/Language:   Clear and Coherent  Affect:  Depressed and anxious  Mood:  anxious and depressed  Thought process:  goal directed  Thought content:    Rumination  Sensory/Perceptual disturbances:    WNL  Orientation:  oriented to person, place, time/date, situation, day of week, month of year, year, and stated date of Jan. 13, 2026  Attention:  Good  Concentration:  Fair  Memory:  WNL  Fund of knowledge:   Good  Insight:    Good and Fair  Judgment:   Good and Fair  Impulse Control:  Good and Fair   Risk Assessment: Danger to Self:  No Self-injurious Behavior: No Danger to Others: No Duty to Warn:no Physical Aggression / Violence:No  Access to Firearms a concern: No  Gang Involvement:No   Subjective:  Patient today working further on her anxiety, some sadness, depression, making healthier decisions. I'm having issues with son and daughter-in-law. Feels that there are issues in our relationship that I think they feel negatively towards me. Trying to decide how to talk further with her adult son and father of her grandson more directly.  Stresses a lot over there relationships or lack of relationship with grandson and son and his wife.  Feeling left out at times and overlooked.  Tearfully talked about how this has been so difficult for her and yet has not really tried to talk with them yet.  Work tonight on her trying to change some of the assumptions that she is automatically making even though she has not checked out some particular issues with her son.  Also looking at ways that she might be able to approach him in a way  where he does not feel confronted and rather have a genuine open conversation focusing as much on listening as on talking, but also in the conversation being able to calmly ask some questions rather than making assumptions.  Is also working on making healthier decisions.  Had been dreading the holidays some in her previous session but acknowledged that the holidays did go okay and felt good about that except some of the family interactions which she talked through today, and how they impacted her.  Trying to keep healthier boundaries, and particularly with some people at work and her second job.  Self-esteem continues to be a work in  progress and we worked on this some more in session today.  Has had some back issues recently but did not seem to be quite as acute today.  Does want to love herself more and take better care of herself particularly in some decision making and also not being so quick to make negative assumptions and dealing with other people particularly within her family.   Interventions: Cognitive Behavioral Therapy, Solution-Oriented/Positive Psychology, and Ego-Supportive Long term goal: Develop the ability to recognize, accept, and cope with feelings of anxiety and  depression. Short term goal: Identify and replace anxious and depressive thinking that leads to anxious or depressive feelings and actions. Replace with more realistic and empowering thoughts.  Strategy: Reinforce patient's positive, reality based cognitive messages that enhance self-confidence  and increase adaptive behaviors/action.    Diagnosis:   ICD-10-CM   1. Situational mixed anxiety and depressive disorder  F43.23       Plan: Patient working well in session today focusing more on some sadness, depression, anxiety, feeling left out at times within the family, stress management, improving her self-care, elevating self-esteem, and needing to make healthier decisions/choices.  Worked hard on the issues regarding  her son, his wife, and their young child, as patient misses feeling like a real grandparent and being able to spend more time with the child.  Processed this at length and patient does plan to talk with her son and try to establish some better communication.  Encouraging patient to recognize some of her positives as well as her negatives or challenges, and also notice even small progress that she has made. Therapist encouraging patient to keep practicing positive behaviors as we have discussed in sessions including: Focusing more on her health goals for better physical and emotional health, working on her grief regarding the unexpected death of a close friend within the past year and the death of another close friend soon after that person had died, wanting to believe more in herself that she can make significant positive changes, desires to strengthen the helpful relationships that she does have, hoping to improve her self-talk, staying in touch with people who are supportive, saying no when she needs to say no to others without feeling guilty, making positive choices more consistently, and recognize the strength she can show when working with goal-directed behaviors to move in a direction that best supports her improved overall emotional health and outlook going forward.  Felisha Claytor is working to make further progress although does have a lot of challenges in her life that affect her motivation at times but she does recognize the need for her to continue working with goal-directed behaviors and try to move forward in a more healthier, positive, and hopeful direction in the future.  Goal review and progress/challenges noted with patient.  Next appointment within 6 weeks.   Barnie Bunde, LCSW                   "

## 2024-07-01 ENCOUNTER — Telehealth: Payer: Self-pay

## 2024-07-01 NOTE — Telephone Encounter (Signed)
 Note was submitted with previously tried formularies but I will try to resubmit

## 2024-07-01 NOTE — Telephone Encounter (Signed)
 PA DENIED for Auvelity  with [facility]. This medication is only covered if she has tried and failed a minimum four week trial (document drug and dates of trials) or cannot use at least two of any formulation of the following: citalopram , duloxetine , fluoxetine, fluvoxamine, paroxetine, or venlafaxine.   Office notes were submitted and they report the criteria has not been met.

## 2024-07-01 NOTE — Telephone Encounter (Signed)
 You probably already submitted this note too, but on 07/16/2023 I documented that she had tried Lexapro , Celexa , Prozac, Cymbalta , Elavil, and Wellbutrin .  The dates of those trials are not known, so it probably would not fly.  Please let the patient know that we can change back to Wellbutrin , and she can take over-the-counter dextromethorphan, 45 mg twice daily, to mimic the Auvelity .

## 2024-07-08 NOTE — Progress Notes (Signed)
 "   Subjective  Patient ID:  Helen Greene is a 54 y.o. (DOB 1970-06-18) female    Patient presents with   Spine - Pain    Pt here for injection consult. She's complaining of low back pain and for the past 2 weeks it's also hurting BL flanks     Pain Pertinent negatives include no abdominal pain, chest pain, headaches, myalgias, nausea, rash or vomiting.   History of Present Illness The patient is a 54 year old female who presents for evaluation of back pain.  She has been experiencing persistent lower back pain for approximately 3 to 4 months, which she initially attributed to muscular strain. The pain is intermittent, with periods of relief and exacerbation. She also reports occasional mid-back discomfort, which she perceives as muscular in nature. Additionally, she experiences chronic leg pain, described as an ache, but reports no associated numbness. Her legs often feel fatigued and exhausted. She has been attending physical therapy sessions for the past 2 months, which she finds beneficial but financially burdensome. She has been performing the exercises at home.  She has been self-medicating with meloxicam , prescribed for tennis elbow, and BC powder, both of which provide some relief. A single course of prednisone  was previously prescribed, which significantly alleviated her symptoms. She has not had any since then. She has not tried Celebrex, she was scared of the side effects. She has cyclobenzaprine  5 mg at home, which she finds ineffective.  Her primary occupation involves prolonged sitting, and she also works part-time at [employer], where she stands for 4 hours. She wears non-slip shoes for work. She has been informed by Dr. Dasie of a bulging disc impinging on a nerve. She has considered back injections as a potential treatment option. She has a history of similar back issues following the birth of her first child, which resolved spontaneously.  She has GERD and kidney stones.  SOCIAL  HISTORY Occupations: Primary occupation involves prolonged sitting; also works part-time at [employer], standing for 4 hours.    Reviewed and updated this visit by provider: Tobacco  Allergies  Meds  Problems  Med Hx  Surg Hx  Fam Hx        Review of Systems  Constitutional:  Negative for activity change.  Eyes:  Negative for visual disturbance.  Respiratory:  Negative for chest tightness and shortness of breath.   Cardiovascular:  Negative for chest pain, palpitations and leg swelling.  Gastrointestinal:  Negative for abdominal pain, diarrhea, nausea and vomiting.  Genitourinary:  Negative for difficulty urinating.  Musculoskeletal:  Negative for myalgias.  Skin:  Negative for rash.  Neurological:  Negative for dizziness and headaches.         Objective   Vitals:   07/08/24 1408  Height: 4' 11 (1.499 m)  Weight: 244 lb (110.7 kg)  BMI (Calculated): 49.3  PainSc:   2  PainLoc: Back     Physical Exam   Physical Exam  Hips: Slight difference in hip height, with the right hip appearing slightly higher than the left.  Physical exam: Patient is alert and oriented x3. Answers questions appropriately. Does not appear ill or toxic. Cranial nerves II-XII are grossly intact. Visual field appear to be intact. No hearing deficits appreciated. Abdomen is soft and non-tender. Chest is without abnormality or deformity. Heart is regular rate and rhythm, no gallups, rubs, or murmurs. BUE 5/5 strength throughout. BLE pulses are palpable, skin is warm and dry, no erythema, or edema is noted. BLE strength is  5/5 in bilateral EHL, DF, PF, quads, hamstrings, HF, HE, abductors, and adductors. Sensation is symmetric bilaterally. Negative straight leg raise. Negative Belvie Bellman. Patient walks with a narrow-based gait, no instability on exam. Pain with both flexion/extension. Pain with facet loading. Patellar and achilles reflexes 2+ and symmetric bilaterally.    Results XR 4V lumbar 06/02/24  on [facility]: Moderate disc space collapse L4-5 and L5-S1.  Mild scoliotic deformity.  No obvious fractures or significant instability noted.  Hip joints overall are well-maintained.   - MRI lumbar 04/2024 on [facility]: Right eccentric disc bulge with endplate spurring at L5-S1 with moderate right L5 foraminal stenosis. Additional mild noncompressive disc bulging at L1-2 through L4-5 without significant stenosis or impingement. Mild to moderate bilateral facet hypertrophy at L3-4 through L5-S1.     Assessment and Plan  1. Spondylosis without myelopathy or radiculopathy, lumbar region (Primary) -     Ambulatory Referral to Neuro/Spine Program; Future 2. Facet arthritis of lumbar region -     Amb Referral to Physiatry 3. Myalgia    Assessment & Plan 1. Lumbar pain: At this point we discussed exam.  All questions were answered fully.  She does have significant spondylosis as well as facet disease worse at L4-5 and L5-S1.  She has some foraminal narrowing at L4-5 and L5-S1.  Back pain is her main complaint. She does get leg pain but she states it is more achiness and is not as much radicular in nature.  At this point she has done physical therapy, oral steroids, anti-inflammatories as well as activity modifications with little relief.   She may trial Celebrex 200 mg daily to help with pain and inflammation.   I recommend she still continue to do her core strengthening exercises and may supplement with back curator program. Also she will work on an anti-inflammatory diet.  We will target the L4-5 and L5-S1 facet joints with Bilateral L345 medial branch blocks and if positive x2 she would be a candidate for RFA.[name] Risks and side effects of the medications discussed with patient today.  Follow up for Bilatearl L345 MBB.    Risks, benefits, and alternatives of the medications and treatment plan prescribed today were discussed, and patient expressed understanding. Plan follow-up as discussed or as needed if  any worsening symptoms or change in condition.   Total time spent with patient was greater than 30 minutes at today's visit, including history and physical exam, ordering medications, review of previous OV notes and imaging, review of advanced imaging. assessment, plan of care, and documentation.  "

## 2024-07-18 ENCOUNTER — Emergency Department (HOSPITAL_COMMUNITY)
Admission: EM | Admit: 2024-07-18 | Discharge: 2024-07-19 | Disposition: A | Attending: Emergency Medicine | Admitting: Emergency Medicine

## 2024-07-18 ENCOUNTER — Emergency Department (HOSPITAL_COMMUNITY)

## 2024-07-18 ENCOUNTER — Other Ambulatory Visit: Payer: Self-pay

## 2024-07-18 DIAGNOSIS — Z79899 Other long term (current) drug therapy: Secondary | ICD-10-CM | POA: Insufficient documentation

## 2024-07-18 DIAGNOSIS — R079 Chest pain, unspecified: Secondary | ICD-10-CM | POA: Insufficient documentation

## 2024-07-18 DIAGNOSIS — I10 Essential (primary) hypertension: Secondary | ICD-10-CM | POA: Insufficient documentation

## 2024-07-18 LAB — TROPONIN T, HIGH SENSITIVITY
Troponin T High Sensitivity: <6 ng/L (ref 0–19)
Troponin T High Sensitivity: <6 ng/L (ref 0–19)

## 2024-07-18 LAB — D-DIMER, QUANTITATIVE: D-Dimer, Quant: 0.78 ug{FEU}/mL — ABNORMAL HIGH (ref 0.00–0.50)

## 2024-07-18 LAB — CBC
HCT: 42.3 % (ref 36.0–46.0)
Hemoglobin: 14.5 g/dL (ref 12.0–15.0)
MCH: 31.5 pg (ref 26.0–34.0)
MCHC: 34.3 g/dL (ref 30.0–36.0)
MCV: 92 fL (ref 80.0–100.0)
Platelets: 380 10*3/uL (ref 150–400)
RBC: 4.6 MIL/uL (ref 3.87–5.11)
RDW: 12.1 % (ref 11.5–15.5)
WBC: 17.9 10*3/uL — ABNORMAL HIGH (ref 4.0–10.5)
nRBC: 0 % (ref 0.0–0.2)

## 2024-07-18 LAB — BASIC METABOLIC PANEL WITH GFR
Anion gap: 14 (ref 5–15)
BUN: 15 mg/dL (ref 6–20)
CO2: 26 mmol/L (ref 22–32)
Calcium: 10.1 mg/dL (ref 8.9–10.3)
Chloride: 101 mmol/L (ref 98–111)
Creatinine, Ser: 0.85 mg/dL (ref 0.44–1.00)
GFR, Estimated: >60 mL/min
Glucose, Bld: 128 mg/dL — ABNORMAL HIGH (ref 70–99)
Potassium: 3.6 mmol/L (ref 3.5–5.1)
Sodium: 140 mmol/L (ref 135–145)

## 2024-07-18 NOTE — ED Provider Triage Note (Signed)
 Emergency Medicine Provider Triage Evaluation Note  Helen Greene , a 54 y.o. female  was evaluated in triage.  Pt complains of chest pain.  First pain has been intermittent for the last few weeks but was constant today.  Denies any cardiac history, does not smoke.  Review of Systems  Positive:  Negative:   Physical Exam  BP (!) 147/78 (BP Location: Right Arm)   Pulse 100   Temp 98.6 F (37 C)   Resp 16   LMP 09/07/2015   SpO2 99%  Gen:   Awake, no distress   Resp:  Normal effort  MSK:   Moves extremities without difficulty  Other:    Medical Decision Making  Medically screening exam initiated at 8:02 PM.  Appropriate orders placed.  Helen Greene was informed that the remainder of the evaluation will be completed by another provider, this initial triage assessment does not replace that evaluation, and the importance of remaining in the ED until their evaluation is complete.     Helen Greene 07/18/24 2003

## 2024-07-18 NOTE — ED Provider Notes (Signed)
 " [facility] AT [facility] Provider Note   CSN: [id number] Arrival date & time: 07/18/24  8064     Patient presents with: Chest Pain   Helen Greene is a 54 y.o. female.  {Add pertinent medical, surgical, social history, OB history to YEP:67052} The history is provided by the patient and medical records.  Chest Pain  54 year old female with history of anxiety, hypertension, vitamin D  deficiency, presenting to the ED for chest pain.  States this has been ongoing for few weeks intermittently.  Initially she chalked this up to acid reflux but today symptoms have been more constant and have been feeling different.  She has had some increased belching today.  She denies any nausea or vomiting.  Pain is worse with laying flat or taking deep breaths.  She denies any prior cardiac history.  She is not a smoker.  No prior history of DVT or PE and no recent travel or prolonged immobilization.  She is not currently on any exogenous estrogens.  Prior to Admission medications  Medication Sig Start Date End Date Taking? Authorizing Provider  ARIPiprazole  (ABILIFY ) 2 MG tablet Take 1 tablet (2 mg total) by mouth every morning. 05/20/24   Rhys Boyer T, PA-C  chlorhexidine  (HIBICLENS ) 4 % external liquid Apply topically daily as needed. Apply to warm wash cloth to boil twice daily for 7 days 01/12/24   Glennon Almarie POUR, MD  Cholecalciferol (VITAMIN D ) 50 MCG (2000 UT) CAPS Take 2 capsules (4,000 Units total) by mouth daily. 01/28/21   Rhys Boyer DASEN, PA-C  Dextromethorphan-buPROPion  ER (AUVELITY ) 45-105 MG TBCR Take 1 tablet by mouth 2 (two) times daily. 02/19/24   Rhys Boyer T, PA-C  escitalopram  (LEXAPRO ) 20 MG tablet Take 1.5 tablets (30 mg total) by mouth daily. 05/20/24   Rhys Boyer T, PA-C  hydrochlorothiazide  (HYDRODIURIL ) 12.5 MG tablet Take 12.5 mg by mouth every morning. Patient not taking: Reported on 05/20/2024 03/14/23   [provider]  lidocaine   (XYLOCAINE ) 5 % ointment Apply 1 Application topically 3 (three) times daily. 01/12/24   Glennon Almarie POUR, MD  LORazepam  (ATIVAN ) 0.5 MG tablet Take 1 tablet (0.5 mg total) by mouth every 8 (eight) hours as needed for anxiety. 05/20/24   Rhys Boyer DASEN, PA-C  pramipexole  (MIRAPEX ) 0.25 MG tablet Take 1 tablet (0.25 mg total) by mouth at bedtime. 05/20/24   Rhys Boyer T, PA-C  valACYclovir  (VALTREX ) 500 MG tablet Take 1 tablet (500 mg total) by mouth daily. 01/12/24   Glennon Almarie POUR, MD    Allergies: Bee venom, Enablex [darifenacin hydrobromide], Erythromycin, Macrobid [nitrofurantoin monohyd macro], Metronidazole , Nitrofuran derivatives, and Risperidone and paliperidone    Review of Systems  Cardiovascular:  Positive for chest pain.  All other systems reviewed and are negative.   Updated Vital Signs BP (!) 147/78 (BP Location: Right Arm)   Pulse 100   Temp 98.6 F (37 C)   Resp 16   Ht 5' 0.5 (1.537 m)   Wt 106.6 kg   LMP 09/07/2015   SpO2 99%   BMI 45.14 kg/m   Physical Exam Vitals and nursing note reviewed.  Constitutional:      Appearance: She is well-developed.  HENT:     Head: Normocephalic and atraumatic.  Eyes:     Conjunctiva/sclera: Conjunctivae normal.     Pupils: Pupils are equal, round, and reactive to light.  Cardiovascular:     Rate and Rhythm: Normal rate and regular rhythm.  Heart sounds: Normal heart sounds.  Pulmonary:     Effort: Pulmonary effort is normal.     Breath sounds: Normal breath sounds.  Abdominal:     General: Bowel sounds are normal.     Palpations: Abdomen is soft.  Musculoskeletal:        General: Normal range of motion.     Cervical back: Normal range of motion.  Skin:    General: Skin is warm and dry.  Neurological:     Mental Status: She is alert and oriented to person, place, and time.     (all labs ordered are listed, but only abnormal results are displayed) Labs Reviewed  BASIC METABOLIC PANEL WITH GFR -  Abnormal; Notable for the following components:      Result Value   Glucose, Bld 128 (*)    All other components within normal limits  CBC - Abnormal; Notable for the following components:   WBC 17.9 (*)    All other components within normal limits  D-DIMER, QUANTITATIVE  TROPONIN T, HIGH SENSITIVITY  TROPONIN T, HIGH SENSITIVITY    EKG: EKG Interpretation Date/Time:  Sunday July 18 2024 19:52:57 EST Ventricular Rate:  96 PR Interval:  128 QRS Duration:  94 QT Interval:  346 QTC Calculation: 437 R Axis:   63  Text Interpretation: Normal sinus rhythm When compared with ECG of 12-Nov-2017 20:07, PREVIOUS ECG IS PRESENT No significant changes Confirmed by Cottie Cough [phone]) on 07/18/2024 9:40:11 PM  Radiology: [facility] Chest 2 View Result Date: 07/18/2024 EXAM: 2 VIEW(S) XRAY OF THE CHEST 07/18/2024 08:11:00 PM COMPARISON: 5 / 29 / 19 . CLINICAL HISTORY: Chest pain. FINDINGS: LUNGS AND PLEURA: No focal pulmonary opacity. No pleural effusion. No pneumothorax. HEART AND MEDIASTINUM: No acute abnormality of the cardiac and mediastinal silhouettes. BONES AND SOFT TISSUES: No acute osseous abnormality. IMPRESSION: 1. No acute cardiopulmonary abnormality. Electronically signed by: Norman Gatlin MD 07/18/2024 08:18 PM EST RP Workstation: [id number]    {Document cardiac monitor, telemetry assessment procedure when appropriate:32947} Procedures   Medications Ordered in the ED - No data to display    {Click here for ABCD2, HEART and other calculators REFRESH Note before signing:1}                              Medical Decision Making Amount and/or Complexity of Data Reviewed Labs: ordered. Radiology: ordered.   ***  {Document critical care time when appropriate  Document review of labs and clinical decision tools ie CHADS2VASC2, etc  Document your independent review of radiology images and any outside records  Document your discussion with family members, caretakers and with  consultants  Document social determinants of health affecting pt's care  Document your decision making why or why not admission, treatments were needed:32947:::1}   Final diagnoses:  None    ED Discharge Orders     None        "

## 2024-07-18 NOTE — ED Triage Notes (Signed)
 Pt reports constant centralized chest pain that radiates into the neck and bilateral shoulders since this afternoon around 3 pm - CP has been intermittent since this morning. CP worsening with movement. Sts she has a hx of heartburn but this feels different. CP described as pressure and burning. Is associated with SOB and back pain.

## 2024-07-18 NOTE — ED Triage Notes (Signed)
 PT c/o sternal chest pain she describes as heart burn and pressure that is radiating up her neck.  Pain started this am and is getting worse.

## 2024-07-19 ENCOUNTER — Emergency Department (HOSPITAL_COMMUNITY)

## 2024-07-19 MED ORDER — FAMOTIDINE 20 MG PO TABS: 20.0000 mg | ORAL_TABLET | Freq: Two times a day (BID) | ORAL | 0 refills | Status: AC

## 2024-07-19 MED ORDER — IOHEXOL 350 MG/ML SOLN
80.0000 mL | Freq: Once | INTRAVENOUS | Status: AC | PRN
  Administered 2024-07-19: 80 mL via INTRAVENOUS

## 2024-07-19 MED ORDER — SIMETHICONE 125 MG PO CHEW: 125.0000 mg | CHEWABLE_TABLET | Freq: Four times a day (QID) | ORAL | 0 refills | Status: AC | PRN

## 2024-07-19 NOTE — Discharge Instructions (Signed)
 Work-up today was reassuring.  No evidence of cardiac or pulmonary issues. Take the prescribed medication as directed to see if this helps. Follow-up with your doctor if symptoms persist. Return to the ED for new or worsening symptoms.

## 2024-07-21 NOTE — Telephone Encounter (Signed)
 Resubmitted information for PA on Auvelity  45-105 mg, approval now received effective 07/19/24-07/19/25 [facility] [id number]

## 2024-07-21 NOTE — Telephone Encounter (Signed)
 Noted.

## 2024-07-23 ENCOUNTER — Telehealth: Payer: Self-pay | Admitting: Physician Assistant

## 2024-07-23 NOTE — Telephone Encounter (Signed)
 PA approved thru 07/19/25. Pt confused. Told her that PA was tied to insurance and not to pharmacy. She does not need a RF currently.

## 2024-07-23 NOTE — Telephone Encounter (Signed)
 Next appt is 08/18/24. Helen Greene called about her prior authorization for her Auvility. The prior authorization was sent to [facility] but she said that it must be sent to:  [facility] - [place], [place] - [address]   Phone: [phone]  Fax: [phone]

## 2024-08-18 ENCOUNTER — Ambulatory Visit: Admitting: Psychiatry

## 2024-09-28 ENCOUNTER — Ambulatory Visit: Admitting: Psychiatry

## 2024-11-18 ENCOUNTER — Ambulatory Visit: Admitting: Physician Assistant
